# Patient Record
Sex: Female | Born: 1940
Health system: Southern US, Community
[De-identification: ages and names within clinical notes are randomized; demographics above are authoritative.]

## PROBLEM LIST (undated history)

## (undated) DIAGNOSIS — E785 Hyperlipidemia, unspecified: Secondary | ICD-10-CM

## (undated) DIAGNOSIS — E119 Type 2 diabetes mellitus without complications: Secondary | ICD-10-CM

## (undated) DIAGNOSIS — I10 Essential (primary) hypertension: Secondary | ICD-10-CM

## (undated) HISTORY — PX: FOOT FRACTURE SURGERY: SHX645

---

## 2019-06-25 ENCOUNTER — Other Ambulatory Visit: Payer: Self-pay

## 2019-06-25 ENCOUNTER — Encounter (HOSPITAL_COMMUNITY): Payer: Self-pay

## 2019-06-25 ENCOUNTER — Ambulatory Visit (HOSPITAL_COMMUNITY)
Admission: EM | Admit: 2019-06-25 | Discharge: 2019-06-25 | Disposition: A | Discharge status: Home / Self Care | Payer: Medicare Other | Attending: Physician Assistant | Admitting: Physician Assistant

## 2019-06-25 DIAGNOSIS — Z20822 Contact with and (suspected) exposure to covid-19: Secondary | ICD-10-CM | POA: Insufficient documentation

## 2019-06-25 DIAGNOSIS — J301 Allergic rhinitis due to pollen: Secondary | ICD-10-CM | POA: Diagnosis present

## 2019-06-25 DIAGNOSIS — I10 Essential (primary) hypertension: Secondary | ICD-10-CM | POA: Insufficient documentation

## 2019-06-25 DIAGNOSIS — J069 Acute upper respiratory infection, unspecified: Secondary | ICD-10-CM | POA: Diagnosis not present

## 2019-06-25 HISTORY — DX: Type 2 diabetes mellitus without complications: E11.9

## 2019-06-25 HISTORY — DX: Hyperlipidemia, unspecified: E78.5

## 2019-06-25 HISTORY — DX: Essential (primary) hypertension: I10

## 2019-06-25 LAB — CBC
HCT: 37.5 % (ref 36.0–46.0)
Hemoglobin: 12.2 g/dL (ref 12.0–15.0)
MCH: 24.6 pg — ABNORMAL LOW (ref 26.0–34.0)
MCHC: 32.5 g/dL (ref 30.0–36.0)
MCV: 75.8 fL — ABNORMAL LOW (ref 80.0–100.0)
Platelets: 285 10*3/uL (ref 150–400)
RBC: 4.95 MIL/uL (ref 3.87–5.11)
RDW: 15.4 % (ref 11.5–15.5)
WBC: 6 10*3/uL (ref 4.0–10.5)
nRBC: 0 % (ref 0.0–0.2)

## 2019-06-25 LAB — COMPREHENSIVE METABOLIC PANEL
ALT: 14 U/L (ref 0–44)
AST: 17 U/L (ref 15–41)
Albumin: 3.7 g/dL (ref 3.5–5.0)
Alkaline Phosphatase: 57 U/L (ref 38–126)
Anion gap: 8 (ref 5–15)
BUN: 14 mg/dL (ref 8–23)
CO2: 25 mmol/L (ref 22–32)
Calcium: 10.1 mg/dL (ref 8.9–10.3)
Chloride: 110 mmol/L (ref 98–111)
Creatinine, Ser: 1.47 mg/dL — ABNORMAL HIGH (ref 0.44–1.00)
GFR calc Af Amer: 39 mL/min — ABNORMAL LOW (ref >60)
GFR calc non Af Amer: 34 mL/min — ABNORMAL LOW (ref >60)
Glucose, Bld: 104 mg/dL — ABNORMAL HIGH (ref 70–99)
Potassium: 4.3 mmol/L (ref 3.5–5.1)
Sodium: 143 mmol/L (ref 135–145)
Total Bilirubin: 0.4 mg/dL (ref 0.3–1.2)
Total Protein: 7 g/dL (ref 6.5–8.1)

## 2019-06-25 LAB — VITAMIN D 25 HYDROXY (VIT D DEFICIENCY, FRACTURES): Vit D, 25-Hydroxy: 41.53 ng/mL (ref 30–100)

## 2019-06-25 MED ORDER — FLUTICASONE PROPIONATE 50 MCG/ACT NA SUSP: 1.0000 | Freq: Every day | NASAL | 0 refills | Status: DC

## 2019-06-25 MED ORDER — BENZONATATE 100 MG PO CAPS: 100.0000 mg | ORAL_CAPSULE | Freq: Three times a day (TID) | ORAL | 0 refills | Status: DC

## 2019-06-25 MED ORDER — CETIRIZINE HCL 10 MG PO TABS: 5.0000 mg | ORAL_TABLET | Freq: Every day | ORAL | 0 refills | Status: DC

## 2019-06-25 NOTE — ED Triage Notes (Signed)
Pt c/o non-productive cough and sweatsx1 wk. Pt denies SOB. Pt has non labored breathing. Lungs are clear.

## 2019-06-25 NOTE — ED Provider Notes (Signed)
Christy [name]    CSN: [id number] Arrival date & time: 06/25/19  1138      History   Chief Complaint Chief Complaint  Patient presents with  . Cough    HPI Christy [name] is a 79 y.o. female.   Patient reports urgent care for evaluation of cough and nasal congestion.  She reports the cough has been ongoing since moved to [place] about 1 year ago.  She has taken over-the-counter medicines and this has helped. She does not know what these medicines are by name. She reports a little bit worsening cough in the evenings over the last 2 to 3 weeks.  She reports the cough feels as though she is having a tickle in her throat.  The cough is never productive.  She denies chest pain or shortness of breath with the cough.  Though she does states she gets a little tired and short winded occasionally.  She is denying any shortness of breath or chest pain in clinic currently.  Denies fever or chills.  However she has felt a little sweaty and hot occasionally over the last couple weeks.  Denies sore throat or ear pain.  She does report having headaches occasionally that have started since moving to [place] from [place], [place].  She denies headache in clinic today.  Denies blurry vision.  Denies nausea, vomiting or diarrhea.  She reports she takes a medicine for high blood pressure but cannot remember the name.  She reports she takes Metformin for diabetes otherwise she does not take any medicines chronically.  She has not had primary care follow-up since she has moved in [place] from [place].  She had regular primary care in [place].  She reports some falls lately which are due to her tripping.  Denies injuries. Denies losing conciousness.     Past Medical History:  Diagnosis Date  . Diabetes mellitus without complication ([place])   . Hyperlipidemia   . Hypertension     There are no problems to display for this patient.   Past Surgical History:  Procedure  Laterality Date  . FOOT FRACTURE SURGERY Right     OB History   No obstetric history on file.      Home Medications    Prior to Admission medications   Medication Sig Start Date End Date Taking? Authorizing Provider  metFORMIN (GLUCOPHAGE) 500 MG tablet Take by mouth 2 (two) times daily with a meal.   Yes [provider]  benzonatate (TESSALON) 100 MG capsule Take 1 capsule (100 mg total) by mouth every 8 (eight) hours. 06/25/19   Christy [name], Christy Beards, Christy [name]  cetirizine (ZYRTEC ALLERGY) 10 MG tablet Take 0.5 tablets (5 mg total) by mouth daily. 06/25/19 07/25/19  Christy [name], Christy Beards, Christy [name]  fluticasone (FLONASE) 50 MCG/ACT nasal spray Place 1 spray into both nostrils daily. 06/25/19 07/25/19  Christy [name], Christy Beards, Christy [name]    Family History No family history on file.  Social History Social History   Tobacco Use  . Smoking status: Former Research scientist (life sciences)  . Smokeless tobacco: Never Used  Substance Use Topics  . Alcohol use: Not Currently  . Drug use: Never     Allergies   Patient has no known allergies.   Review of Systems Review of Systems Per HPI  Physical Exam Triage Vital Signs ED Triage Vitals [06/25/19 1233]  Enc Vitals Group     BP (!) 191/95     Pulse Rate 88     Resp 16  Temp 98.7 F (37.1 C)     Temp Source Oral     SpO2 100 %     Weight 210 lb (95.3 kg)     Height 5\' 4"  (1.626 m)     Head Circumference      Peak Flow      Pain Score 0     Pain Loc      Pain Edu?      Excl. in [place]?    No data found.  Updated Vital Signs BP (!) 191/95   Pulse 88   Temp 98.7 F (37.1 C) (Oral)   Resp 16   Ht 5\' 4"  (1.626 m)   Wt 210 lb (95.3 kg)   SpO2 100%   BMI 36.05 kg/m    Blood pressure provider recheck- 170/74 right arm  Visual Acuity Right Eye Distance:   Left Eye Distance:   Bilateral Distance:    Right Eye Near:   Left Eye Near:    Bilateral Near:     Physical Exam Vitals and nursing note reviewed.  Constitutional:      General: She is not in acute  distress.    Appearance: She is well-developed. She is not ill-appearing.  HENT:     Head: Normocephalic and atraumatic.     Nose:     Comments: Mild nasal congestion present. Mildly erythematous and swollen turbinates    Mouth/Throat:     Mouth: Mucous membranes are moist.     Pharynx: Oropharynx is clear.  Eyes:     Conjunctiva/sclera: Conjunctivae normal.     Pupils: Pupils are equal, round, and reactive to light.  Cardiovascular:     Rate and Rhythm: Normal rate and regular rhythm.     Heart sounds: No murmur.  Pulmonary:     Effort: Pulmonary effort is normal. No respiratory distress.     Breath sounds: Normal breath sounds.  Abdominal:     Palpations: Abdomen is soft.     Tenderness: There is no abdominal tenderness.  Musculoskeletal:     Cervical back: Neck supple.     Right lower leg: No edema.     Left lower leg: No edema.  Lymphadenopathy:     Cervical: No cervical adenopathy.  Skin:    General: Skin is warm and dry.  Neurological:     General: No focal deficit present.     Mental Status: She is alert and oriented to person, place, and time.      UC Treatments / Results  Labs (all labs ordered are listed, but only abnormal results are displayed) Labs Reviewed  CBC - Abnormal; Notable for the following components:      Result Value   MCV 75.8 (*)    MCH 24.6 (*)    All other components within normal limits  COMPREHENSIVE METABOLIC PANEL - Abnormal; Notable for the following components:   Glucose, Bld 104 (*)    Creatinine, Ser 1.47 (*)    GFR calc non Af Amer 34 (*)    GFR calc Af Amer 39 (*)    All other components within normal limits  SARS CORONAVIRUS 2 (TAT 6-24 HRS)  VITAMIN D 25 HYDROXY (VIT D DEFICIENCY, FRACTURES)    EKG   Radiology No results found.  Procedures Procedures (including critical care time)  Medications Ordered in UC Medications - No data to display  Initial Impression / Assessment and Plan / UC Course  I have reviewed  the triage vital signs and the nursing  notes.  Pertinent labs & imaging results that were available during my care of the patient were reviewed by me and considered in my medical decision making (see chart for details).     #Viral URI with cough #Allergic rhinitis #Elevated blood pressure Patient is a 79 year old-year-old presenting with cough and nasal congestion.  Patient is new to the area and does not establish care.  I do believe that this is likely allergy however we will test for Covid.  She is hemodynamically well and saturating 100% without respiratory distress.  Blood pressure was initially elevated to 191/95 however on recheck is somewhat improved to 170/74.  Patient not experiencing chest pain or shortness of breath in clinic.   They verbalized understanding.  Patient with strict return precautions for worsening cough, shortness of breath or chest pain. -CMP significant for creatinine at 1.47. no comparison. Doubt acute renal failure, likely chronic. Patient called and results discussed and re-emphasized follow up with internal medicine clinic. Patient verbalizes understanding.    Final Clinical Impressions(s) / UC Diagnoses   Final diagnoses:  Viral URI with cough  Allergic rhinitis due to pollen, unspecified seasonality  Encounter for laboratory testing for COVID-19 virus     Discharge Instructions     Please stop the store bought medicines, but continue your blood pressure medicine and metformin  Start taking 1/2 tablet of zyrtec and using flonase 1 time daily for allergies and nasal congestion Take the tessalon gel cap up to 3 times daily for cough  Please call the internal medicine clinic to establish care   I have sent some lab work, I will call if anything requires attention, otherwise your results will be in your mychart  If cough worsens, you develop shortness of breath, chest pain or fevers please return  If your Covid-19 test is positive, you will receive a  phone call from [facility] regarding your results. Negative test results are not called. Both positive and negative results area always visible on MyChart. If you do not have a MyChart account, sign up instructions are in your discharge papers.   Persons who are directed to care for themselves at home may discontinue isolation under the following conditions:  . At least 10 days have passed since symptom onset and . At least 24 hours have passed without running a fever (this means without the use of fever-reducing medications) and . Other symptoms have improved.  Persons infected with COVID-19 who never develop symptoms may discontinue isolation and other precautions 10 days after the date of their first positive COVID-19 test.       ED Prescriptions    Medication Sig Dispense Auth. Provider   cetirizine (ZYRTEC ALLERGY) 10 MG tablet Take 0.5 tablets (5 mg total) by mouth daily. 15 tablet Alec Mcphee, Christy Beards, Christy [name]   fluticasone (FLONASE) 50 MCG/ACT nasal spray Place 1 spray into both nostrils daily. 1 g Zakyia Gagan, Christy Beards, Christy [name]   benzonatate (TESSALON) 100 MG capsule Take 1 capsule (100 mg total) by mouth every 8 (eight) hours. 21 capsule Shinichi Anguiano, Christy Beards, Christy [name]     PDMP not reviewed this encounter.   Purnell Shoemaker, Christy [name] 06/25/19 2235

## 2019-06-25 NOTE — Discharge Instructions (Addendum)
Please stop the store bought medicines, but continue your blood pressure medicine and metformin  Start taking 1/2 tablet of zyrtec and using flonase 1 time daily for allergies and nasal congestion Take the tessalon gel cap up to 3 times daily for cough  Please call the internal medicine clinic to establish care   I have sent some lab work, I will call if anything requires attention, otherwise your results will be in your mychart  If cough worsens, you develop shortness of breath, chest pain or fevers please return  If your Covid-19 test is positive, you will receive a phone call from [facility] regarding your results. Negative test results are not called. Both positive and negative results area always visible on MyChart. If you do not have a MyChart account, sign up instructions are in your discharge papers.   Persons who are directed to care for themselves at home may discontinue isolation under the following conditions:   At least 10 days have passed since symptom onset and  At least 24 hours have passed without running a fever (this means without the use of fever-reducing medications) and  Other symptoms have improved.  Persons infected with COVID-19 who never develop symptoms may discontinue isolation and other precautions 10 days after the date of their first positive COVID-19 test.

## 2019-06-26 LAB — SARS CORONAVIRUS 2 (TAT 6-24 HRS): SARS Coronavirus 2: NEGATIVE

## 2019-09-26 ENCOUNTER — Ambulatory Visit (HOSPITAL_COMMUNITY)
Admission: EM | Admit: 2019-09-26 | Discharge: 2019-09-26 | Discharge status: Against Medical Advice | Payer: Medicare Other | Attending: Emergency Medicine | Admitting: Emergency Medicine

## 2019-09-26 ENCOUNTER — Other Ambulatory Visit: Payer: Self-pay

## 2019-09-26 ENCOUNTER — Encounter (HOSPITAL_COMMUNITY): Payer: Self-pay | Admitting: Emergency Medicine

## 2019-09-26 NOTE — ED Triage Notes (Addendum)
Pt c/o recently moving here and needing refills on her medication. She states she still has 2 days worth of diabetic medication left. She has some weakness in her legs. She takes Metformin and Benicar. She has the bottles with her today. She has an appt to see a PCP in September.

## 2019-09-27 ENCOUNTER — Ambulatory Visit (HOSPITAL_COMMUNITY)
Admission: RE | Admit: 2019-09-27 | Discharge: 2019-09-27 | Disposition: A | Discharge status: Home / Self Care | Payer: Medicare Other | Source: Ambulatory Visit | Attending: Family Medicine | Admitting: Family Medicine

## 2019-09-27 ENCOUNTER — Encounter (HOSPITAL_COMMUNITY): Payer: Self-pay

## 2019-09-27 VITALS — BP 202/95 | HR 91 | Temp 98.4°F | Resp 18

## 2019-09-27 DIAGNOSIS — Z760 Encounter for issue of repeat prescription: Secondary | ICD-10-CM | POA: Diagnosis not present

## 2019-09-27 DIAGNOSIS — R059 Cough, unspecified: Secondary | ICD-10-CM

## 2019-09-27 DIAGNOSIS — E119 Type 2 diabetes mellitus without complications: Secondary | ICD-10-CM

## 2019-09-27 DIAGNOSIS — I10 Essential (primary) hypertension: Secondary | ICD-10-CM

## 2019-09-27 DIAGNOSIS — R05 Cough: Secondary | ICD-10-CM

## 2019-09-27 MED ORDER — CETIRIZINE HCL 10 MG PO TABS: 10.0000 mg | ORAL_TABLET | Freq: Every day | ORAL | 0 refills | Status: DC

## 2019-09-27 MED ORDER — METFORMIN HCL 500 MG PO TABS: 500.0000 mg | ORAL_TABLET | Freq: Two times a day (BID) | ORAL | 1 refills | Status: DC

## 2019-09-27 MED ORDER — OLMESARTAN MEDOXOMIL 20 MG PO TABS: 20.0000 mg | ORAL_TABLET | Freq: Every day | ORAL | 1 refills | Status: DC

## 2019-09-27 NOTE — Discharge Instructions (Signed)
Zyrtec daily as needed Refilled your daily medicine.  Follow up with the primary care as planned.

## 2019-09-27 NOTE — ED Triage Notes (Addendum)
Patient here for fatigue and HTN. Reports this has been going on for over a week. Also endorses cough  Denies: headache, shortness of breath.  Also requesting refills on Metformin and Benicar Tabs

## 2019-09-27 NOTE — ED Provider Notes (Addendum)
[place]    CSN: [id number] Arrival date & time: 09/27/19  1130      History   Chief Complaint Chief Complaint  Patient presents with  . Hypertension  . Fatigue    HPI Christy [name] is a 79 y.o. female.   Patient is a 79 year old female with past medical history of diabetes, hypertension, hyperlipidemia.  She presents today for needing a medication refill.  Recently moved here from [facility]  Has not taken her blood pressure medicine in approximately 1 week and only has a few Metformin left.  She has been overall just not feeling well, fatigued, weak and having allergy type symptoms.  She was recently started on cetirizine.  She is currently out of this medicine.  No fever, chills, body aches, headache, chest pain or shortness of breath.  No dizziness, blurred vision, slurred speech or unilateral weakness.  No urinary symptoms  ROS per HPI      Past Medical History:  Diagnosis Date  . Diabetes mellitus without complication ([place])   . Hyperlipidemia   . Hypertension     There are no problems to display for this patient.   Past Surgical History:  Procedure Laterality Date  . FOOT FRACTURE SURGERY Right     OB History   No obstetric history on file.      Home Medications    Prior to Admission medications   Medication Sig Start Date End Date Taking? Authorizing Provider  cetirizine (ZYRTEC ALLERGY) 10 MG tablet Take 1 tablet (10 mg total) by mouth daily. 09/27/19 10/27/19  Loura Halt A, NP  fluticasone (FLONASE) 50 MCG/ACT nasal spray Place 1 spray into both nostrils daily. 06/25/19 07/25/19  Darr, Marguerita Beards, PA-C  metFORMIN (GLUCOPHAGE) 500 MG tablet Take 1 tablet (500 mg total) by mouth 2 (two) times daily with a meal. 09/27/19   Avea Mcgowen A, NP  olmesartan (BENICAR) 20 MG tablet Take 1 tablet (20 mg total) by mouth daily. 09/27/19   Orvan July, NP    Family History Family History  Family history unknown: Yes    Social  History Social History   Tobacco Use  . Smoking status: Former Research scientist (life sciences)  . Smokeless tobacco: Never Used  Vaping Use  . Vaping Use: Never used  Substance Use Topics  . Alcohol use: Not Currently  . Drug use: Never     Allergies   Patient has no known allergies.   Review of Systems Review of Systems   Physical Exam Triage Vital Signs ED Triage Vitals  Enc Vitals Group     BP 09/27/19 1201 (!) 202/95     Pulse Rate 09/27/19 1201 91     Resp 09/27/19 1201 18     Temp 09/27/19 1201 98.4 F (36.9 C)     Temp src --      SpO2 09/27/19 1201 99 %     Weight --      Height --      Head Circumference --      Peak Flow --      Pain Score 09/27/19 1159 0     Pain Loc --      Pain Edu? --      Excl. in [place]? --    No data found.  Updated Vital Signs BP (!) 202/95   Pulse 91   Temp 98.4 F (36.9 C)   Resp 18   SpO2 99%   Visual Acuity Right Eye Distance:  Left Eye Distance:   Bilateral Distance:    Right Eye Near:   Left Eye Near:    Bilateral Near:     Physical Exam Vitals and nursing note reviewed.  Constitutional:      General: She is not in acute distress.    Appearance: Normal appearance. She is not ill-appearing, toxic-appearing or diaphoretic.  HENT:     Head: Normocephalic.     Right Ear: Tympanic membrane and ear canal normal.     Left Ear: Tympanic membrane and ear canal normal.     Nose: Nose normal.     Mouth/Throat:     Pharynx: Oropharynx is clear.  Eyes:     Conjunctiva/sclera: Conjunctivae normal.     Pupils: Pupils are equal, round, and reactive to light.  Cardiovascular:     Rate and Rhythm: Normal rate and regular rhythm.  Pulmonary:     Effort: Pulmonary effort is normal.     Breath sounds: Normal breath sounds.  Musculoskeletal:        General: Normal range of motion.     Cervical back: Normal range of motion.  Skin:    General: Skin is warm and dry.     Findings: No rash.  Neurological:     General: No focal deficit present.      Mental Status: She is alert.  Psychiatric:        Mood and Affect: Mood normal.      UC Treatments / Results  Labs (all labs ordered are listed, but only abnormal results are displayed) Labs Reviewed - No data to display  EKG   Radiology No results found.  Procedures Procedures (including critical care time)  Medications Ordered in UC Medications - No data to display  Initial Impression / Assessment and Plan / UC Course  I have reviewed the triage vital signs and the nursing notes.  Pertinent labs & imaging results that were available during my care of the patient were reviewed by me and considered in my medical decision making (see chart for details).     Essential hypertension Patient hypertensive today at 202/95.  She has not had her blood pressure medicine in over a week. Otherwise she has no neurological deficits on exam and denies any concerning symptoms We will go ahead and refill her Benicar  Diabetes type 2 Refilled Metformin Patient reports she has been checking her blood sugars at home and ranging in the 160s. Reports she does not drink much water, mostly teas and sodas. Recommended to great decrease sugary drinks and increase water She may be mildly dehydrated causing some of her symptoms.   Cough Most likely from allergies Refilled her allergy meds Lungs clear on exam.     Pt has PCP appointment in September.    Final Clinical Impressions(s) / UC Diagnoses   Final diagnoses:  Medicine refill  Essential hypertension  Type 2 diabetes mellitus without complication, without long-term current use of insulin (H[facility])  Cough     Discharge Instructions     Zyrtec daily as needed Refilled your daily medicine.  Follow up with the primary care as planned.     ED Prescriptions    Medication Sig Dispense Auth. Provider   olmesartan (BENICAR) 20 MG tablet Take 1 tablet (20 mg total) by mouth daily. 30 tablet Rainen Vanrossum A, NP   metFORMIN  (GLUCOPHAGE) 500 MG tablet Take 1 tablet (500 mg total) by mouth 2 (two) times daily with a meal. 60 tablet [place], Catharina Pica  A, NP   cetirizine (ZYRTEC ALLERGY) 10 MG tablet Take 1 tablet (10 mg total) by mouth daily. 30 tablet Loura Halt A, NP     PDMP not reviewed this encounter.   Orvan July, NP 09/27/19 1503    Loura Halt A, NP 09/27/19 1504

## 2020-06-12 ENCOUNTER — Ambulatory Visit
Admission: RE | Admit: 2020-06-12 | Discharge: 2020-06-12 | Disposition: A | Discharge status: Home / Self Care | Payer: Medicare Other | Source: Ambulatory Visit | Attending: Emergency Medicine | Admitting: Emergency Medicine

## 2020-06-12 ENCOUNTER — Other Ambulatory Visit: Payer: Self-pay

## 2020-06-12 VITALS — BP 160/85 | HR 99 | Temp 97.9°F | Resp 16

## 2020-06-12 DIAGNOSIS — I10 Essential (primary) hypertension: Secondary | ICD-10-CM

## 2020-06-12 DIAGNOSIS — N6320 Unspecified lump in the left breast, unspecified quadrant: Secondary | ICD-10-CM | POA: Diagnosis not present

## 2020-06-12 DIAGNOSIS — R21 Rash and other nonspecific skin eruption: Secondary | ICD-10-CM

## 2020-06-12 DIAGNOSIS — N63 Unspecified lump in unspecified breast: Secondary | ICD-10-CM

## 2020-06-12 MED ORDER — CLOTRIMAZOLE 1 % EX CREA: 1.0000 "application " | TOPICAL_CREAM | Freq: Two times a day (BID) | CUTANEOUS | 0 refills | Status: DC

## 2020-06-12 NOTE — ED Triage Notes (Signed)
Pt present a rash on her neck and chest area. Pt states the rash started two week ago. Pt also present a lump underneath left arm. Pt states it only hurts when she laying down on that side.

## 2020-06-12 NOTE — Discharge Instructions (Addendum)
You should receive a call from the [facility] to schedule a mammogram or ultrasound.    Use the prescribed cream on your rash as directed.    Your blood pressure is elevated today at 160/85.  Please have this rechecked by your primary care provider in 2-4 weeks.

## 2020-06-12 NOTE — ED Provider Notes (Signed)
[name]    CSN: [id number] Arrival date & time: 06/12/20  1129      History   Chief Complaint Chief Complaint  Patient presents with  . Rash    HPI Christy [name] is a 80 y.o. female.   Accompanied by her daughter, patient presents with a "lump" on the left lateral breast/lower axilla for several weeks.  It is painful if she is lying on her left side.  No known trauma.  She denies rash in that area, redness, bruising, nipple discharge, fever, chills.  Patient also reports pruritic rash on her neck x2 weeks.  She denies drainage, erythema, or other symptoms.  Treatment attempted at home with antiitch cream.  Her medical history includes hypertension, diabetes, hyperlipidemia.  The history is provided by the patient.    Past Medical History:  Diagnosis Date  . Diabetes mellitus without complication ([place])   . Hyperlipidemia   . Hypertension     There are no problems to display for this patient.   Past Surgical History:  Procedure Laterality Date  . FOOT FRACTURE SURGERY Right     OB History   No obstetric history on file.      Home Medications    Prior to Admission medications   Medication Sig Start Date End Date Taking? Authorizing Provider  clotrimazole (CLOTRIMAZOLE AF) 1 % cream Apply 1 application topically 2 (two) times daily. 06/12/20  Yes Sharion Balloon, NP  cetirizine (ZYRTEC ALLERGY) 10 MG tablet Take 1 tablet (10 mg total) by mouth daily. 09/27/19 10/27/19  Loura Halt A, NP  fluticasone (FLONASE) 50 MCG/ACT nasal spray Place 1 spray into both nostrils daily. 06/25/19 07/25/19  Darr, Edison Nasuti, PA-C  metFORMIN (GLUCOPHAGE) 500 MG tablet Take 1 tablet (500 mg total) by mouth 2 (two) times daily with a meal. 09/27/19   Bast, Traci A, NP  olmesartan (BENICAR) 20 MG tablet Take 1 tablet (20 mg total) by mouth daily. 09/27/19   Orvan July, NP    Family History Family History  Family history unknown: Yes    Social History Social History    Tobacco Use  . Smoking status: Former Research scientist (life sciences)  . Smokeless tobacco: Never Used  Vaping Use  . Vaping Use: Never used  Substance Use Topics  . Alcohol use: Not Currently  . Drug use: Never     Allergies   Patient has no known allergies.   Review of Systems Review of Systems  Constitutional: Negative for chills and fever.  HENT: Negative for ear pain and sore throat.   Eyes: Negative for pain and visual disturbance.  Respiratory: Negative for cough and shortness of breath.   Cardiovascular: Negative for chest pain and palpitations.  Gastrointestinal: Negative for abdominal pain and vomiting.  Genitourinary: Negative for dysuria and hematuria.  Musculoskeletal: Negative for arthralgias and back pain.  Skin: Positive for rash. Negative for color change.  Neurological: Negative for seizures and syncope.  All other systems reviewed and are negative.    Physical Exam Triage Vital Signs ED Triage Vitals  Enc Vitals Group     BP      Pulse      Resp      Temp      Temp src      SpO2      Weight      Height      Head Circumference      Peak Flow      Pain Score  Pain Loc      Pain Edu?      Excl. in [place]?    No data found.  Updated Vital Signs BP (!) 160/85 (BP Location: Left Arm)   Pulse 99   Temp 97.9 F (36.6 C) (Oral)   Resp 16   SpO2 100%   Visual Acuity Right Eye Distance:   Left Eye Distance:   Bilateral Distance:    Right Eye Near:   Left Eye Near:    Bilateral Near:     Physical Exam Vitals and nursing note reviewed.  Constitutional:      General: She is not in acute distress.    Appearance: She is well-developed. She is obese.  HENT:     Head: Normocephalic and atraumatic.     Mouth/Throat:     Mouth: Mucous membranes are moist.  Eyes:     Conjunctiva/sclera: Conjunctivae normal.  Cardiovascular:     Rate and Rhythm: Normal rate and regular rhythm.     Heart sounds: Normal heart sounds.  Pulmonary:     Effort: Pulmonary effort  is normal. No respiratory distress.     Breath sounds: Normal breath sounds.  Chest:    Abdominal:     Palpations: Abdomen is soft.     Tenderness: There is no abdominal tenderness.  Musculoskeletal:     Cervical back: Neck supple.  Skin:    General: Skin is warm and dry.     Findings: Rash present.     Comments: Hyperpigmented patchy rash on left lateral neck.  No drainage or erythema.  Neurological:     General: No focal deficit present.     Mental Status: She is alert and oriented to person, place, and time.  Psychiatric:        Mood and Affect: Mood normal.        Behavior: Behavior normal.      UC Treatments / Results  Labs (all labs ordered are listed, but only abnormal results are displayed) Labs Reviewed - No data to display  EKG   Radiology No results found.  Procedures Procedures (including critical care time)  Medications Ordered in UC Medications - No data to display  Initial Impression / Assessment and Plan / UC Course  I have reviewed the triage vital signs and the nursing notes.  Pertinent labs & imaging results that were available during my care of the patient were reviewed by me and considered in my medical decision making (see chart for details).   Mass in left breast.  Rash left lateral neck.  Elevated blood pressure reading with known hypertension.  Referral for mammogram or ultrasound made to [facility].  Discussed with patient that they will call her to schedule an appointment; phone number for breast center provided to patient to call them if she has not received a call by early next week.  Treating rash on neck with clotrimazole cream.  Instructed patient to follow-up with her PCP if the rash is not improving.  Also discussed that her blood pressure is elevated today and needs to be rechecked by her PCP in 2 to 4 weeks.  Education provided on managing hypertension.  Patient and her daughter agree to plan of care.      Final  Clinical Impressions(s) / UC Diagnoses   Final diagnoses:  Lump or mass in breast  Rash of neck  Elevated blood pressure reading in office with diagnosis of hypertension     Discharge Instructions  You should receive a call from the [facility] to schedule a mammogram or ultrasound.    Use the prescribed cream on your rash as directed.    Your blood pressure is elevated today at 160/85.  Please have this rechecked by your primary care provider in 2-4 weeks.         ED Prescriptions    Medication Sig Dispense Auth. Provider   clotrimazole (CLOTRIMAZOLE AF) 1 % cream Apply 1 application topically 2 (two) times daily. 30 g Sharion Balloon, NP     PDMP not reviewed this encounter.   Sharion Balloon, NP 06/12/20 1242

## 2020-06-13 ENCOUNTER — Other Ambulatory Visit: Payer: Self-pay | Admitting: Emergency Medicine

## 2020-06-13 DIAGNOSIS — N63 Unspecified lump in unspecified breast: Secondary | ICD-10-CM

## 2020-07-24 ENCOUNTER — Ambulatory Visit
Admission: RE | Admit: 2020-07-24 | Discharge: 2020-07-24 | Disposition: A | Discharge status: Home / Self Care | Payer: Medicare Other | Source: Ambulatory Visit | Attending: Emergency Medicine | Admitting: Emergency Medicine

## 2020-07-24 ENCOUNTER — Other Ambulatory Visit: Payer: Self-pay

## 2020-07-24 ENCOUNTER — Other Ambulatory Visit: Payer: Self-pay | Admitting: Emergency Medicine

## 2020-07-24 DIAGNOSIS — N63 Unspecified lump in unspecified breast: Secondary | ICD-10-CM

## 2020-07-24 IMAGING — MG DIGITAL DIAGNOSTIC BILAT W/ TOMO W/ CAD
6 of 10 series · 6 of 30 positions shown · non-contrast
Comparison: None.

CLINICAL DATA: 80-year-old female with palpable LEFT breast mass
discovered on self-examination, and for new baseline bilateral
mammogram.

EXAM:
DIGITAL DIAGNOSTIC BILATERAL MAMMOGRAM WITH TOMOSYNTHESIS AND CAD;
ULTRASOUND LEFT BREAST LIMITED
TECHNIQUE: Bilateral digital diagnostic mammography and breast tomosynthesis
was performed. The images were evaluated with computer-aided
detection.; Targeted ultrasound examination of the left breast was
performed

[L MLO synth-2D]
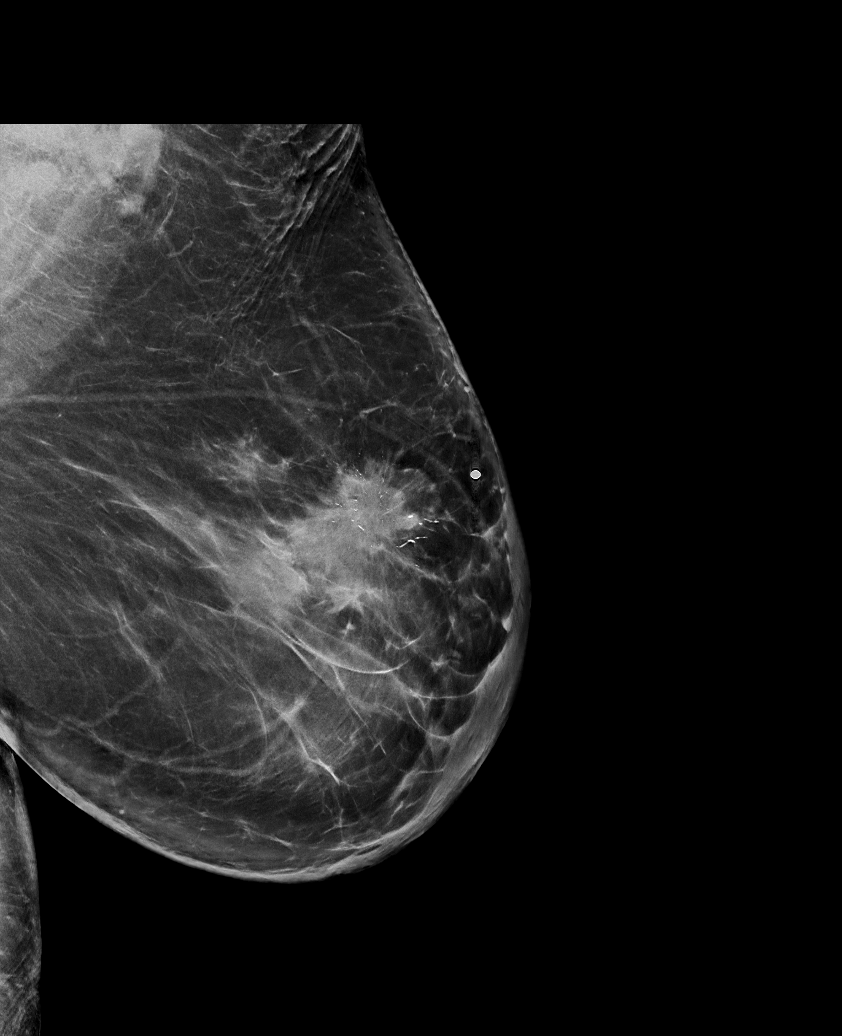

[L CC synth-2D (1 of 2)]
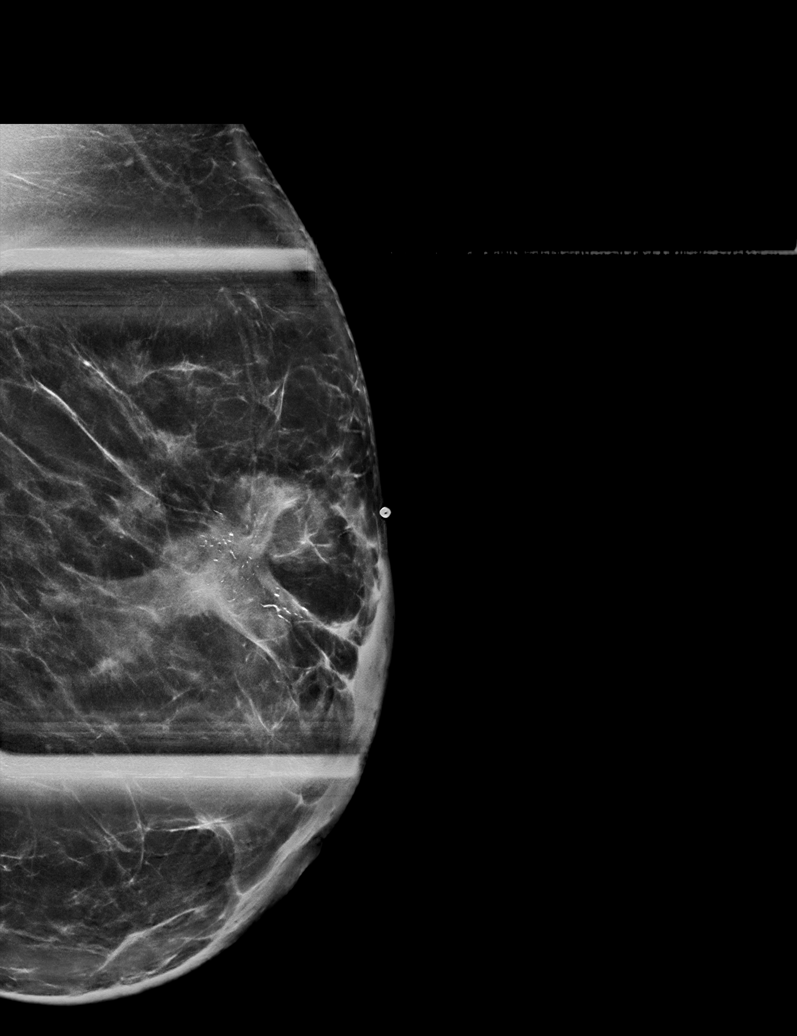

[L CC synth-2D (2 of 2)]
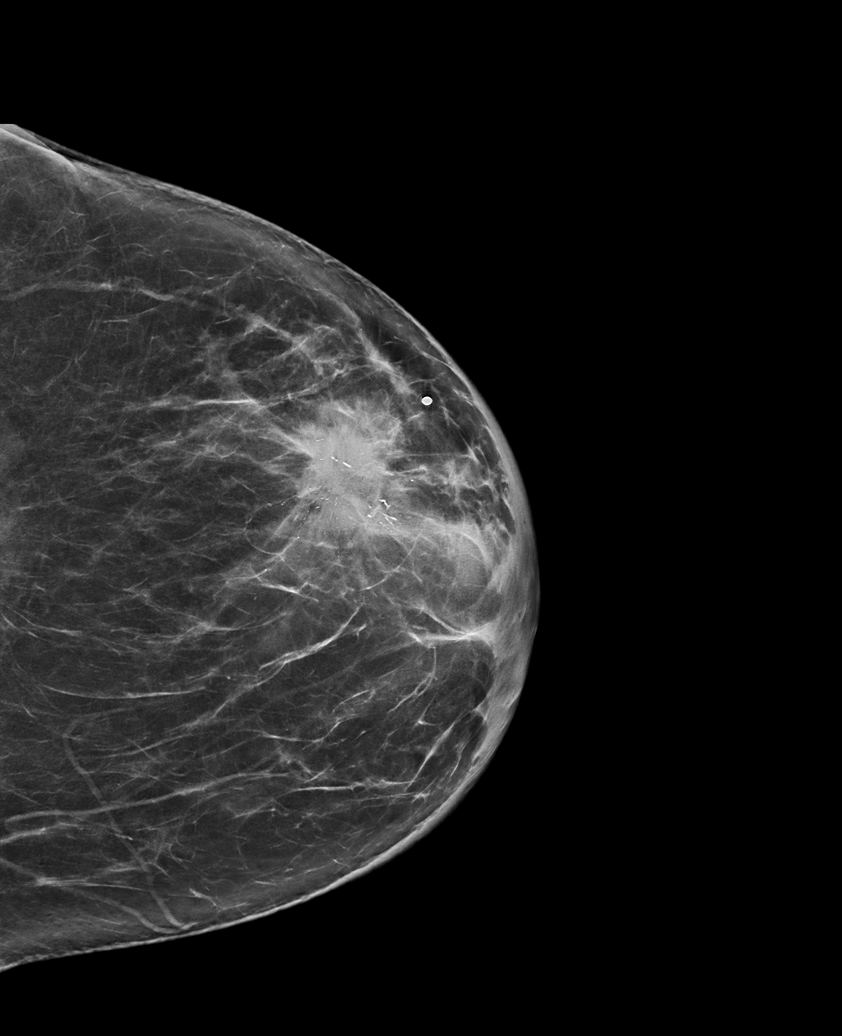

[R CC synth-2D]
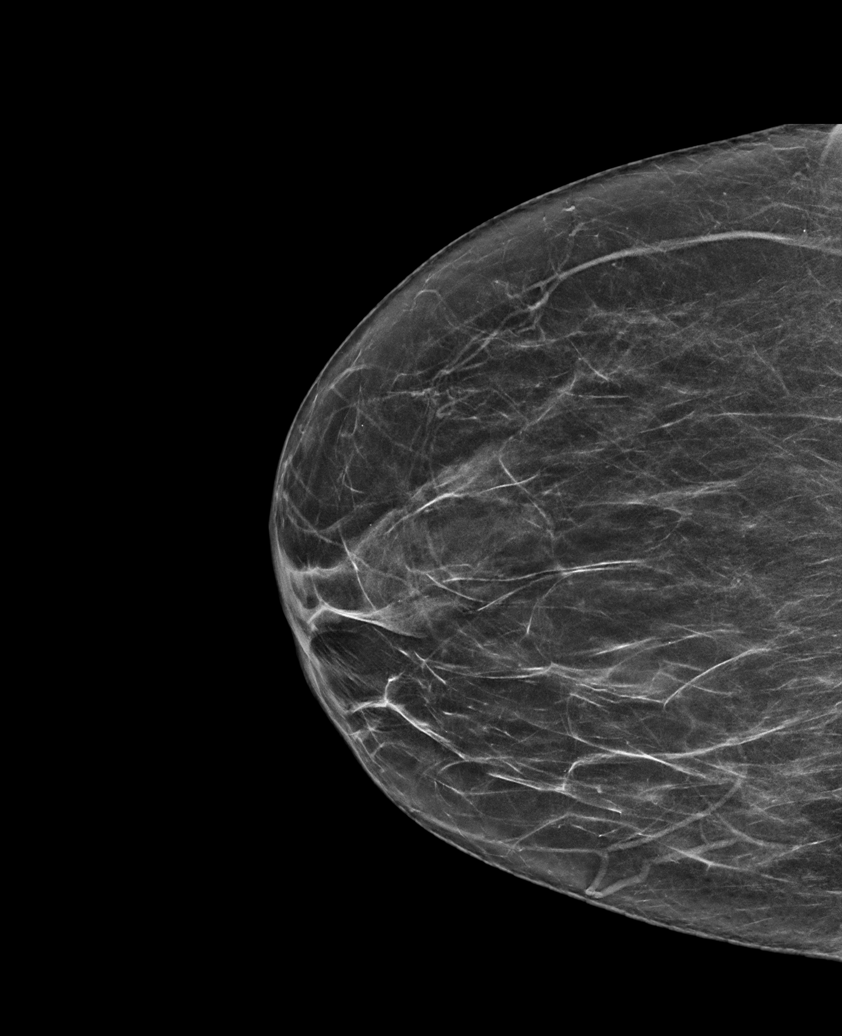

[R MLO synth-2D]
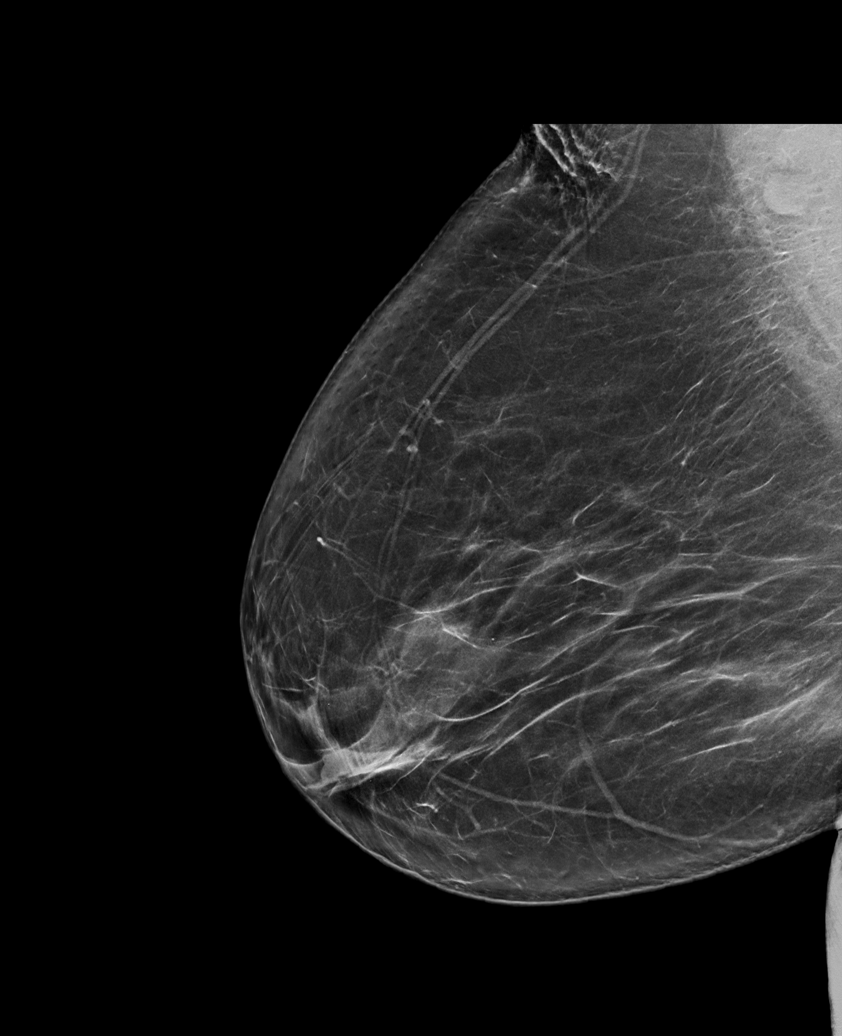

[L MLO tomo · tomo slice 49/98.0]
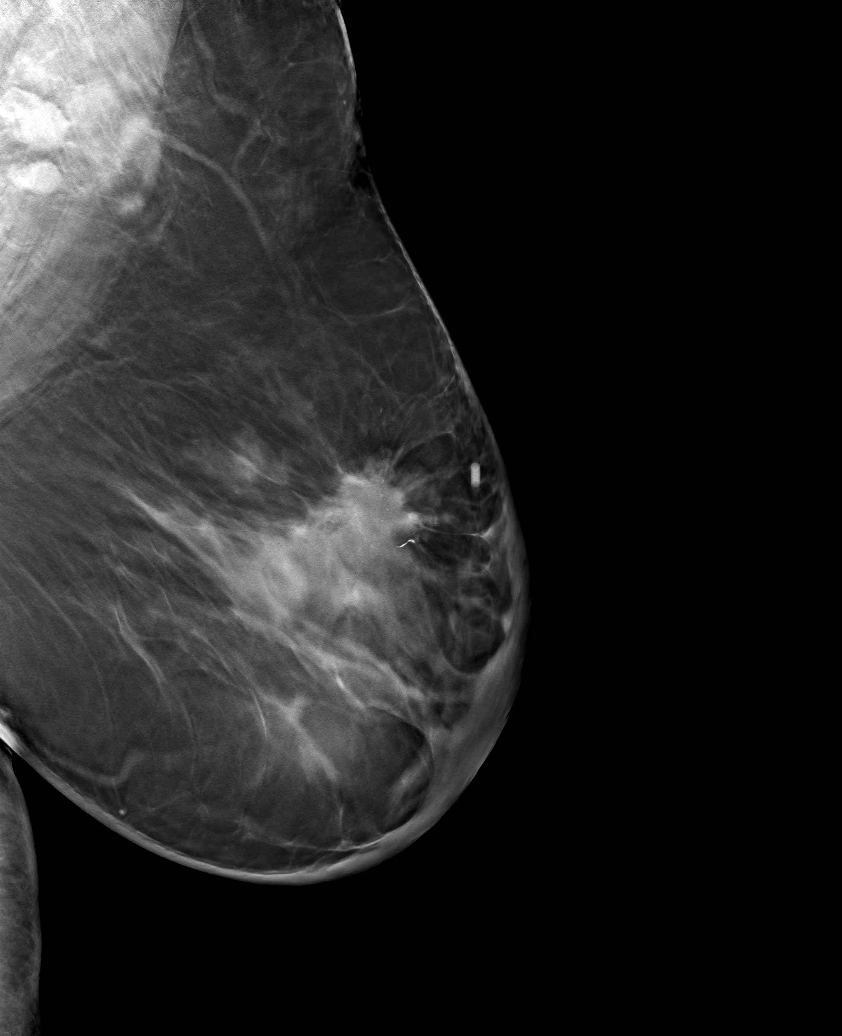

[6 of 30 positions shown; findings below may reference images not displayed]

ACR Breast Density Category b: There are scattered areas of
fibroglandular density.
FINDINGS: 2D/3D full field views of both breasts and spot compression view of
the LEFT breast demonstrate a dominant irregular UPPER-OUTER LEFT
breast mass containing pleomorphic calcifications.

A smaller irregular mass is identified posterolateral to the
dominant mass containing calcifications.

Anterior LEFT breast skin thickening is noted.

No suspicious mammographic abnormalities are noted within the RIGHT
breast.

On physical exam, a very firm mass is identified at the [DATE]
position of the LEFT breast 5 cm from the nipple.

Targeted ultrasound of the LEFT breast/axilla is performed, showing
the following:

A 3.9 x 3.6 x 4.3 cm irregular hypoechoic mass at the [DATE] position
5 cm from the nipple.

A 2.3 x 1.8 x 2.7 cm irregular mass at the 2 o'clock position 8 cm
from the nipple.

The 2 masses above encompass an area measuring at least 6.1 cm.

Anterior LEFT breast skin thickening.

Three abnormal LEFT axillary lymph nodes with cortical thickening.
IMPRESSION: 1. Highly suspicious 4.3 cm mass at the [DATE] position of the LEFT
breast and highly suspicious 2.7 cm mass at the 2 o'clock position
of the LEFT breast, with the 2 masses encompassing an area measuring
at least 6.1 cm. Tissue sampling of both of these masses
recommended.
2. 3 abnormal LEFT axillary lymph nodes with cortical thickening,
tissue sampling of 1 of these lymph nodes recommended.
3. Anterior LEFT breast skin thickening nonspecific but may reflect
dermal involvement.
4. No mammographic evidence of RIGHT breast malignancy.

RECOMMENDATION:
Ultrasound-guided biopsies of LEFT breast mass at the [DATE] position,
LEFT breast mass at the 2 o'clock position and 1 of the abnormal
LEFT axillary lymph nodes.

I have discussed the findings and recommendations with the patient.
If applicable, a reminder letter will be sent to the patient
regarding the next appointment.

BI-RADS CATEGORY  5: Highly suggestive of malignancy.

## 2020-07-24 IMAGING — US US BREAST*L* LIMITED INC AXILLA
1 series · 13 of 25 positions shown · non-contrast
Comparison: None.

CLINICAL DATA: 80-year-old female with palpable LEFT breast mass
discovered on self-examination, and for new baseline bilateral
mammogram.

EXAM:
DIGITAL DIAGNOSTIC BILATERAL MAMMOGRAM WITH TOMOSYNTHESIS AND CAD;
ULTRASOUND LEFT BREAST LIMITED
TECHNIQUE: Bilateral digital diagnostic mammography and breast tomosynthesis
was performed. The images were evaluated with computer-aided
detection.; Targeted ultrasound examination of the left breast was
performed

[Series 1: us breast*left* limited inc axilla · 0.08mm/px · 13 of 25 slices shown]
[im 1/25]
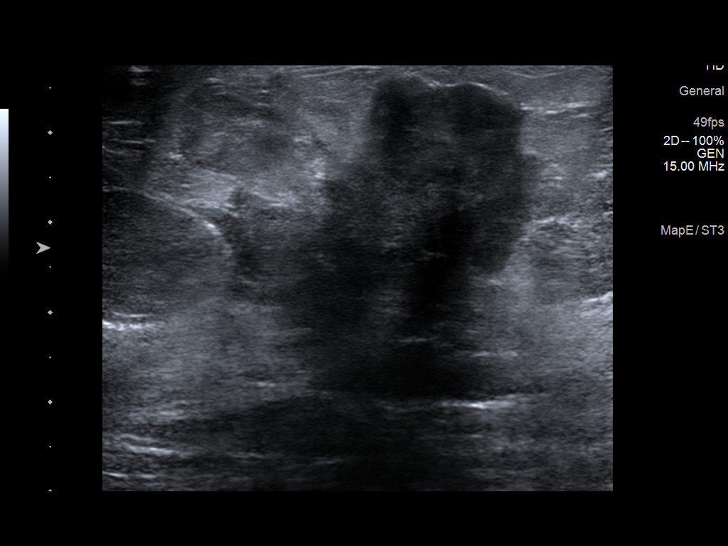
[im 3/25]
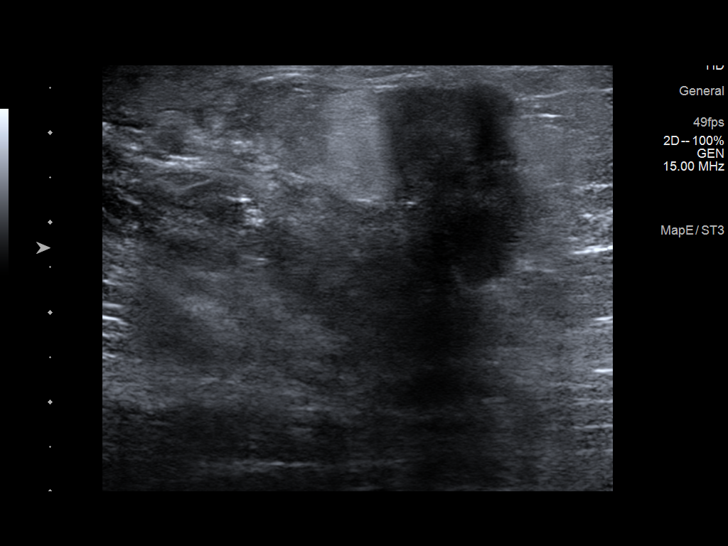
[im 5/25]
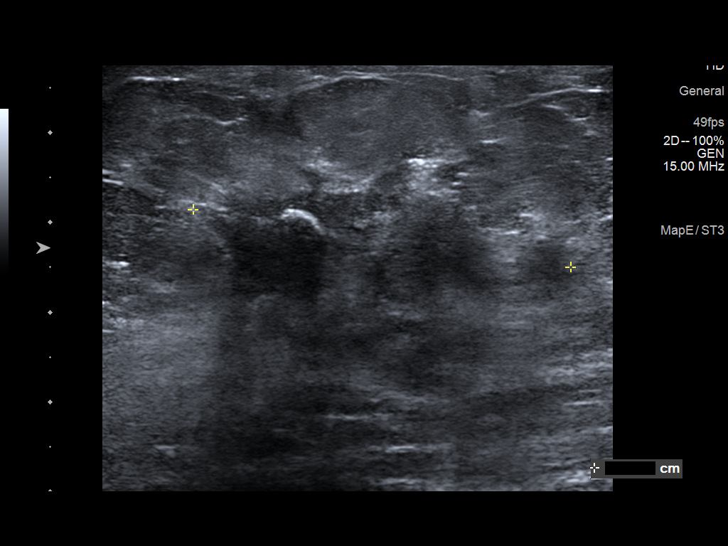
[im 7/25]
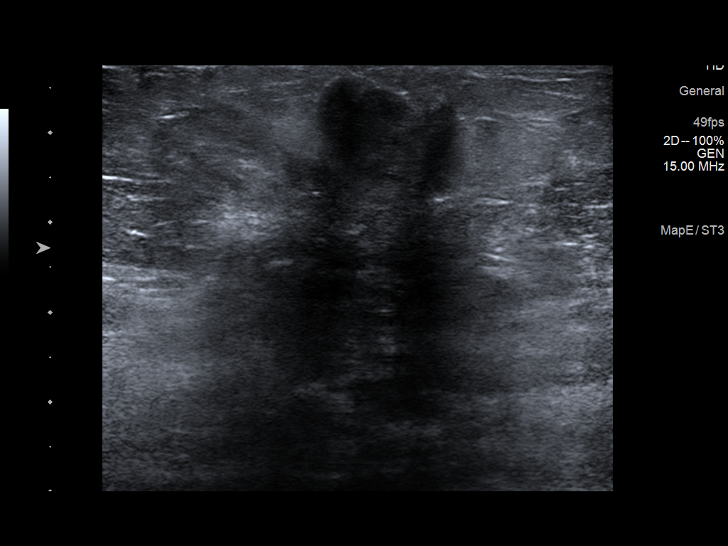
[im 9/25]
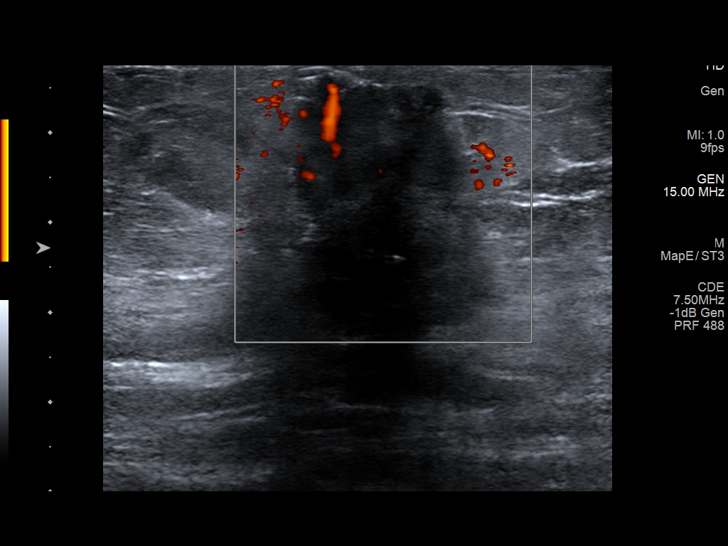
[im 11/25]
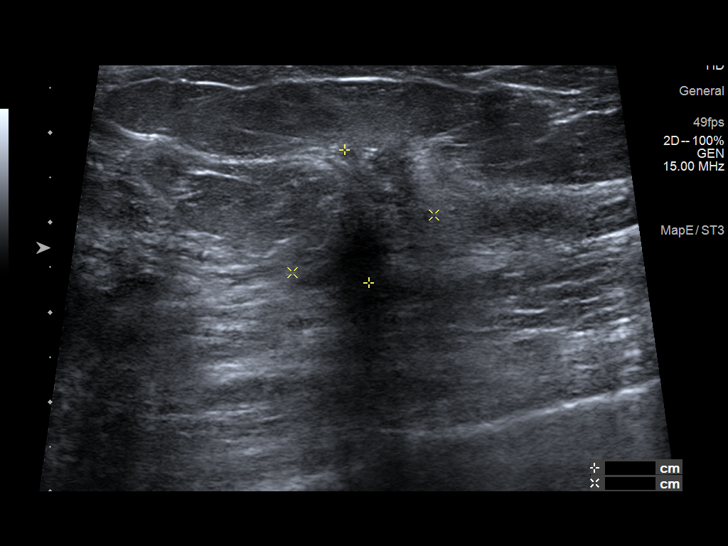
[im 13/25]
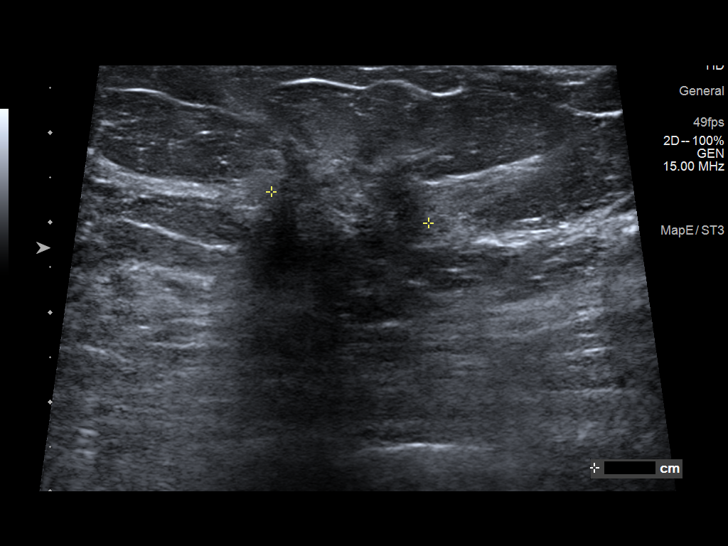
[im 15/25]
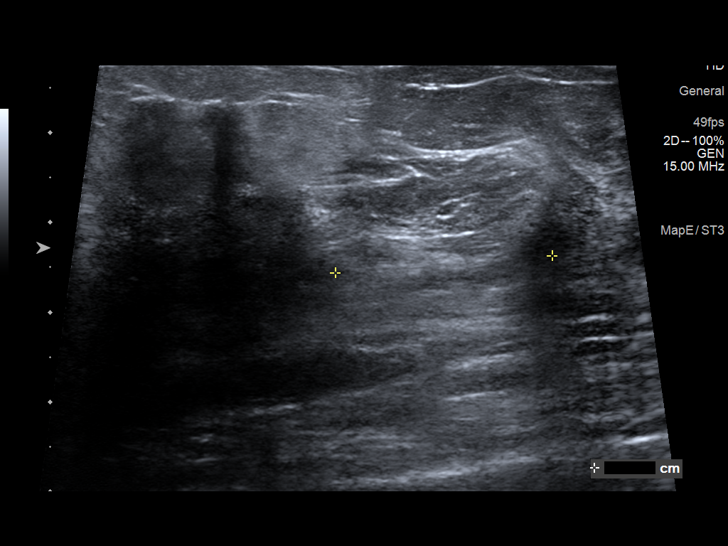
[im 17/25]
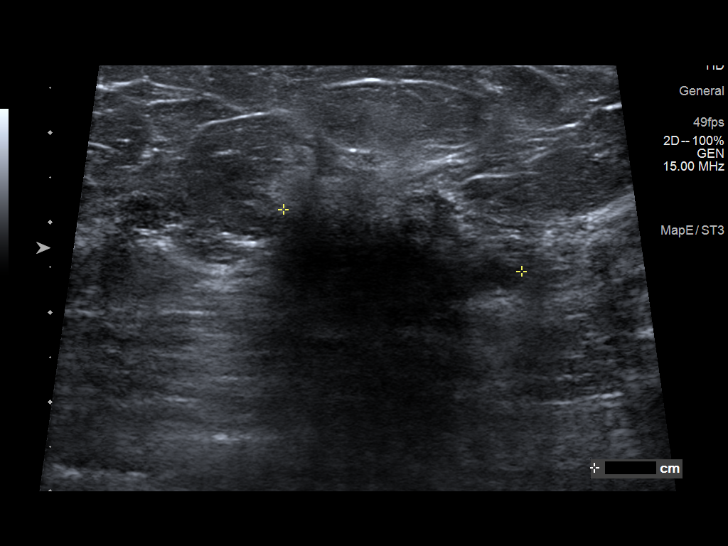
[im 19/25]
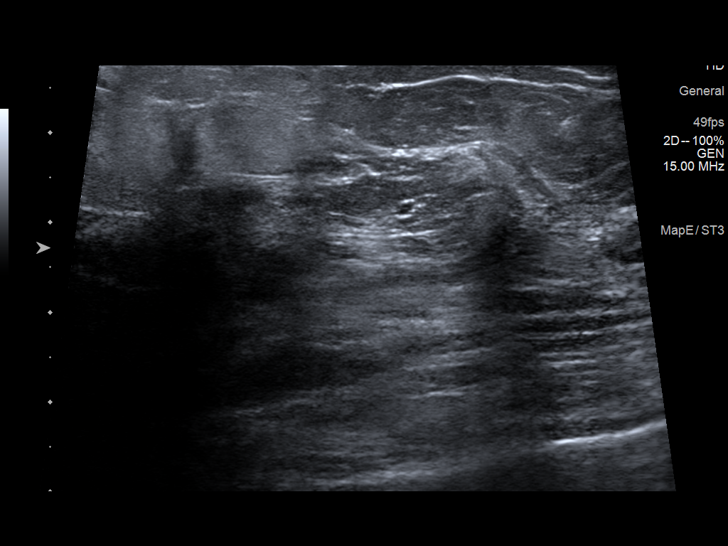
[im 21/25]
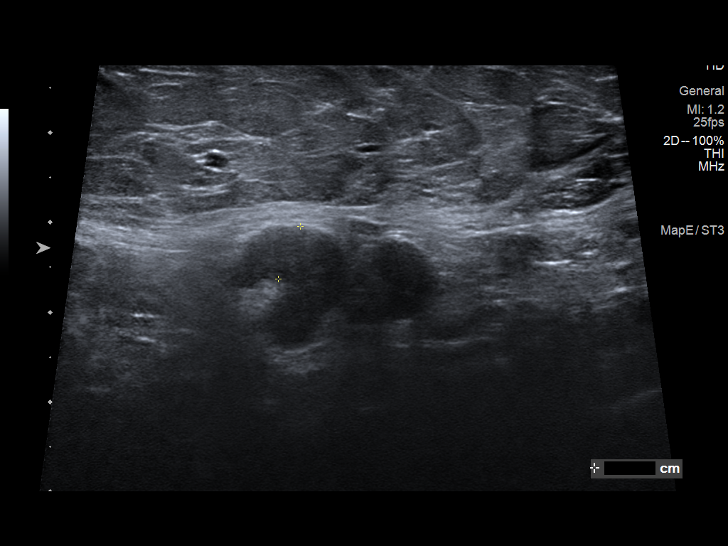
[im 23/25]
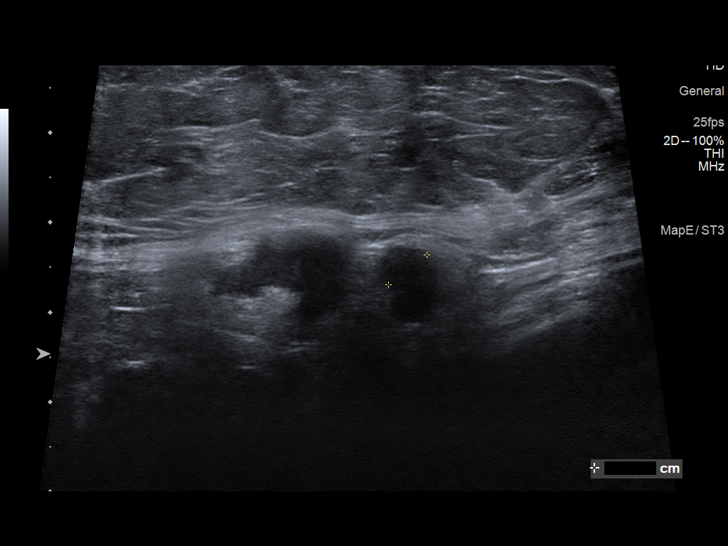
[im 25/25]
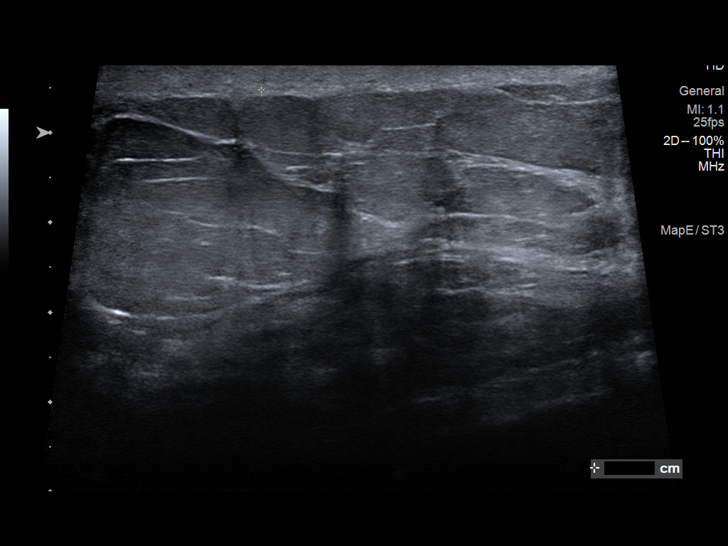

[13 of 25 positions shown; findings below may reference images not displayed]

ACR Breast Density Category b: There are scattered areas of
fibroglandular density.
FINDINGS: 2D/3D full field views of both breasts and spot compression view of
the LEFT breast demonstrate a dominant irregular UPPER-OUTER LEFT
breast mass containing pleomorphic calcifications.

A smaller irregular mass is identified posterolateral to the
dominant mass containing calcifications.

Anterior LEFT breast skin thickening is noted.

No suspicious mammographic abnormalities are noted within the RIGHT
breast.

On physical exam, a very firm mass is identified at the [DATE]
position of the LEFT breast 5 cm from the nipple.

Targeted ultrasound of the LEFT breast/axilla is performed, showing
the following:

A 3.9 x 3.6 x 4.3 cm irregular hypoechoic mass at the [DATE] position
5 cm from the nipple.

A 2.3 x 1.8 x 2.7 cm irregular mass at the 2 o'clock position 8 cm
from the nipple.

The 2 masses above encompass an area measuring at least 6.1 cm.

Anterior LEFT breast skin thickening.

Three abnormal LEFT axillary lymph nodes with cortical thickening.
IMPRESSION: 1. Highly suspicious 4.3 cm mass at the [DATE] position of the LEFT
breast and highly suspicious 2.7 cm mass at the 2 o'clock position
of the LEFT breast, with the 2 masses encompassing an area measuring
at least 6.1 cm. Tissue sampling of both of these masses
recommended.
2. 3 abnormal LEFT axillary lymph nodes with cortical thickening,
tissue sampling of 1 of these lymph nodes recommended.
3. Anterior LEFT breast skin thickening nonspecific but may reflect
dermal involvement.
4. No mammographic evidence of RIGHT breast malignancy.

RECOMMENDATION:
Ultrasound-guided biopsies of LEFT breast mass at the [DATE] position,
LEFT breast mass at the 2 o'clock position and 1 of the abnormal
LEFT axillary lymph nodes.

I have discussed the findings and recommendations with the patient.
If applicable, a reminder letter will be sent to the patient
regarding the next appointment.

BI-RADS CATEGORY  5: Highly suggestive of malignancy.

## 2020-07-31 ENCOUNTER — Ambulatory Visit
Admission: RE | Admit: 2020-07-31 | Discharge: 2020-07-31 | Disposition: A | Discharge status: Home / Self Care | Payer: Medicare Other | Source: Ambulatory Visit | Attending: Emergency Medicine | Admitting: Emergency Medicine

## 2020-07-31 ENCOUNTER — Other Ambulatory Visit: Payer: Self-pay

## 2020-07-31 DIAGNOSIS — N63 Unspecified lump in unspecified breast: Secondary | ICD-10-CM

## 2020-07-31 IMAGING — US US BREAST BX W LOC DEV 1ST LESION IMG BX SPEC US GUIDE*L*
1 series · 15 of 25 positions shown · non-contrast
Comparison: Previous exam(s).
COMPARISON: Previous exam(s).

Addendum:
CLINICAL DATA: 80-year-old female presenting for ultrasound-guided
biopsy of 2 left breast masses and a left axillary lymph node.

EXAM:
ULTRASOUND GUIDED LEFT BREAST CORE NEEDLE BIOPSY

[Series 1: us breast bx w loc dev 1st lesion img bx spec us g · 0.08mm/px · 15 of 34 slices shown]
[im 1/34]
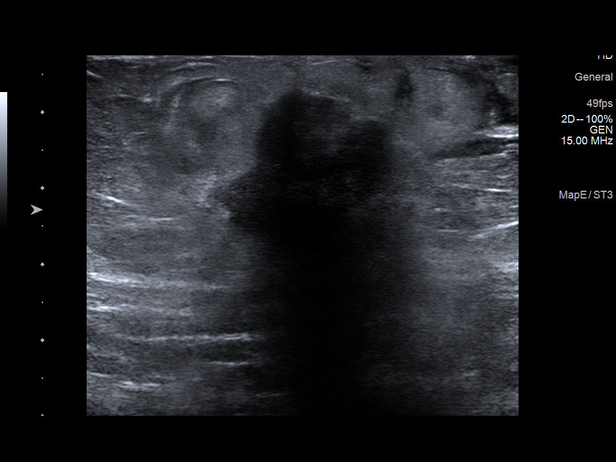
[im 3/34]
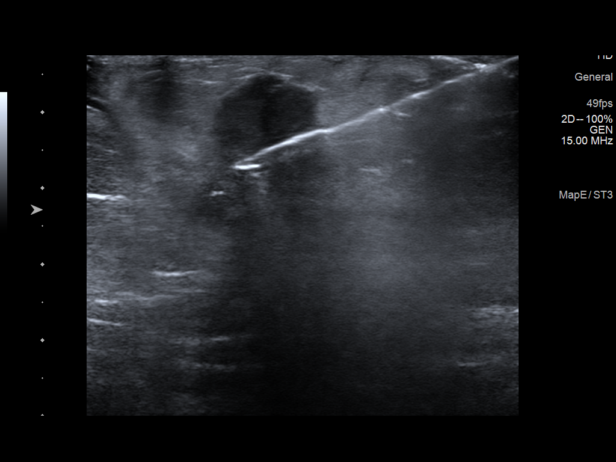
[im 6/34]
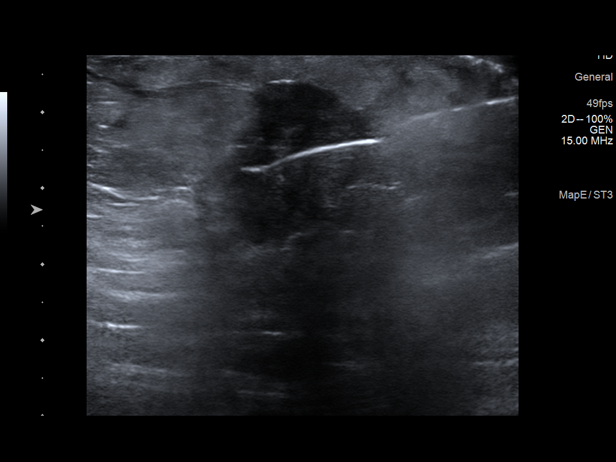
[im 7/34]
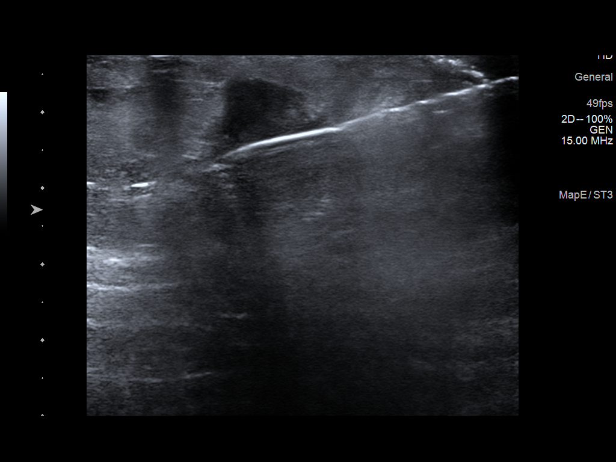
[im 10/34]
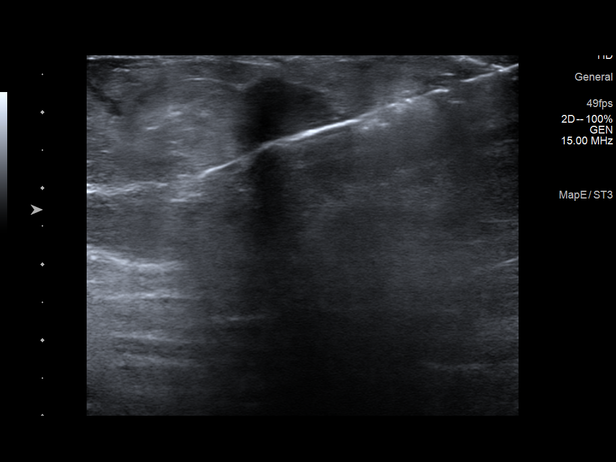
[im 13/34]
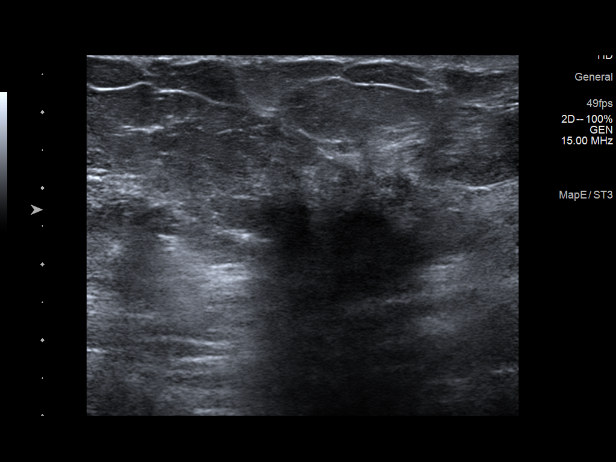
[im 14/34]
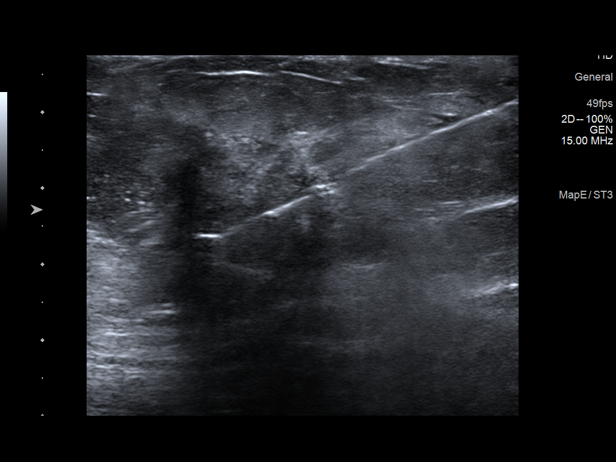
[im 17/34]
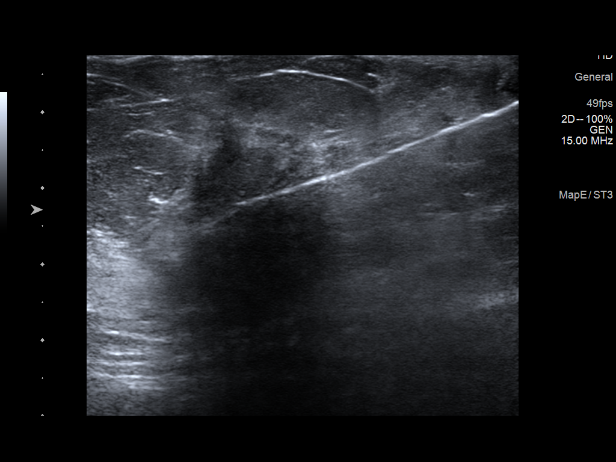
[im 20/34]
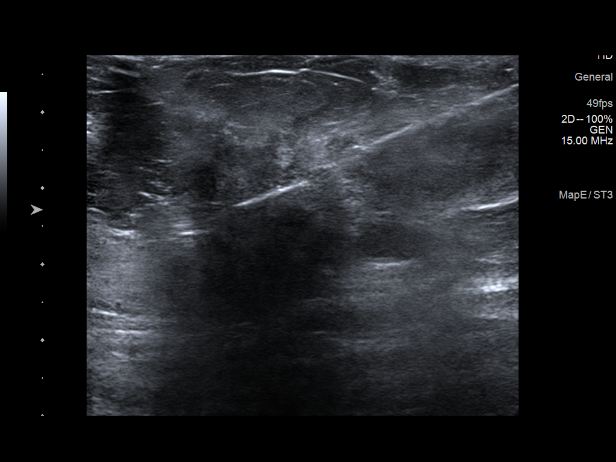
[im 21/34]
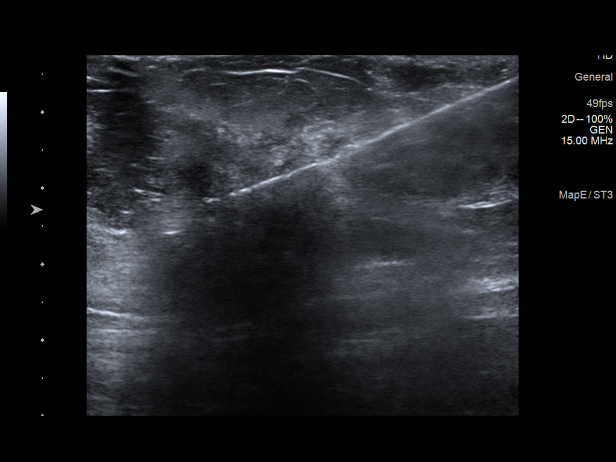
[im 24/34]
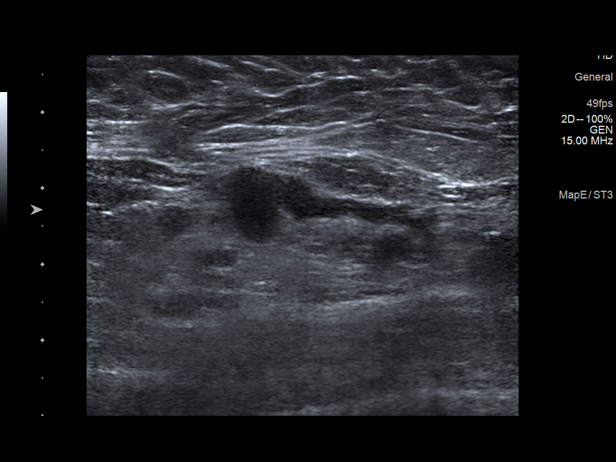
[im 27/34]
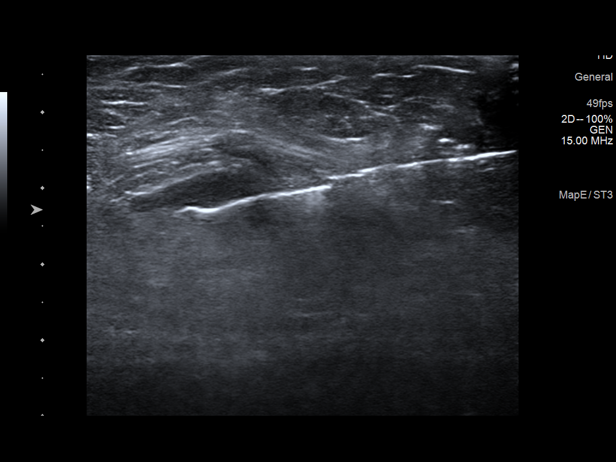
[im 28/34]
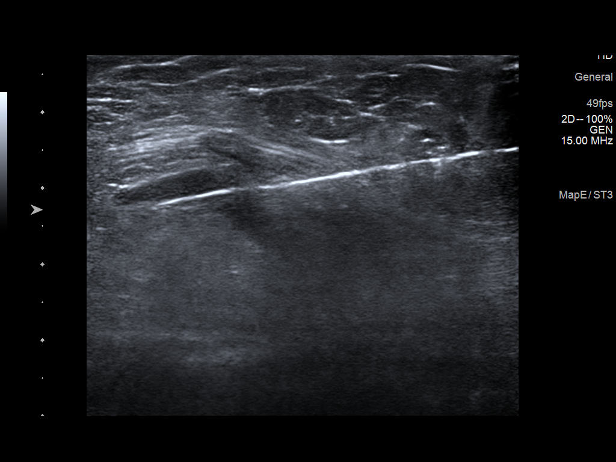
[im 31/34]
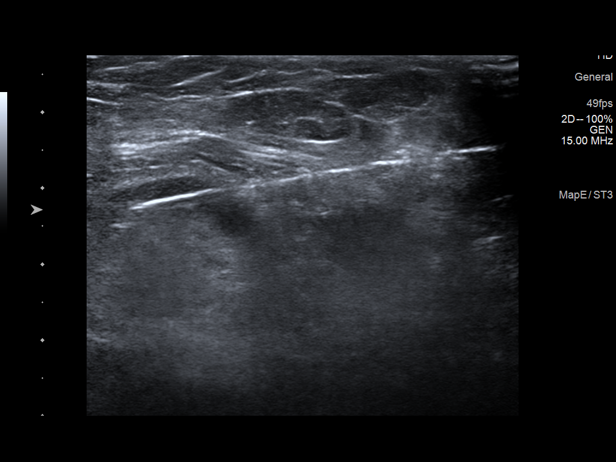
[im 34/34]
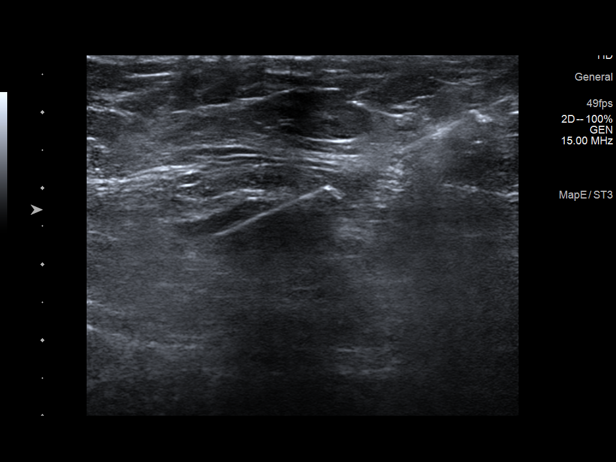

[15 of 25 positions shown; findings below may reference images not displayed]



#1 Lesion quadrant: Upper outer quadrant

Using sterile technique and 1% Lidocaine as local anesthetic, under
direct ultrasound visualization, a 14 gauge BODELO device was
used to perform biopsy of a mass in the left breast at [DATE], 5 cm
from the nipple using an inferior approach. At the conclusion of the
procedure a ribbon shaped tissue marker clip was deployed into the
biopsy cavity.

--------------------------------------------------------------------------------------------------------------------------------------------

#2 Lesion quadrant: Upper outer quadrant

Using sterile technique and 1% Lidocaine as local anesthetic, under
direct ultrasound visualization, a 14 gauge BODELO device was
used to perform biopsy of a mass in the left breast at 2 o'clock, 8
cm from the nipple using an inferior approach. At the conclusion of
the procedure a coil shaped tissue marker clip was deployed into the
biopsy cavity.

--------------------------------------------------------------------------------------------------------------------------------------------

#3 Lesion quadrant: Left axilla

Using sterile technique and 1% Lidocaine as local anesthetic, under
direct ultrasound visualization, a 14 gauge BODELO device was
used to perform biopsy of a left axillary lymph node using an
inferior approach. At the conclusion of the procedure a tribell
tissue marker clip was deployed into the biopsy cavity.

Follow up 2 view mammogram was performed and dictated separately.
IMPRESSION: 1. Ultrasound guided biopsy of a left breast mass at [DATE], 5 cm from
the nipple. No apparent complications.

2. Ultrasound guided biopsy of a left breast mass at 2 o'clock, 8 cm
from the nipple. No apparent complications.

3. Ultrasound guided biopsy of a left axillary lymph node. No
apparent complications.

ADDENDUM:
Pathology revealed GRADE II-III INVASIVE DUCTAL CARCINOMA of the
Left breast, 1:30 o'clock, [OK]. This was found to be concordant by
Dr. BODELO.

Pathology revealed GRADE II-III INVASIVE DUCTAL CARCINOMA, HIGH
GRADE DUCTAL CARCINOMA IN SITU of the Left breast, 2 o'clock, 8
cmfn. This was found to be concordant by Dr. BODELO.

Pathology revealed METASTATIC CARCINOMA IN A LYMPH NODE of the Left
axilla. This was found to be concordant by Dr. BODELO.

Pathology results were discussed with the patient by telephone. The
patient reported doing well after the biopsies with tenderness at
the sites. Post biopsy instructions and care were reviewed and
questions were answered. The patient was encouraged to call The
direct phone number was provided.

Surgical consultation has been arranged with Dr. BODELO at
[REDACTED] on [DATE].

Pathology results reported by BODELO, RN on [DATE].



#1 Lesion quadrant: Upper outer quadrant

Using sterile technique and 1% Lidocaine as local anesthetic, under
direct ultrasound visualization, a 14 gauge BODELO device was
used to perform biopsy of a mass in the left breast at [DATE], 5 cm
from the nipple using an inferior approach. At the conclusion of the
procedure a ribbon shaped tissue marker clip was deployed into the
biopsy cavity.

--------------------------------------------------------------------------------------------------------------------------------------------

#2 Lesion quadrant: Upper outer quadrant

Using sterile technique and 1% Lidocaine as local anesthetic, under
direct ultrasound visualization, a 14 gauge BODELO device was
used to perform biopsy of a mass in the left breast at 2 o'clock, 8
cm from the nipple using an inferior approach. At the conclusion of
the procedure a coil shaped tissue marker clip was deployed into the
biopsy cavity.

--------------------------------------------------------------------------------------------------------------------------------------------

#3 Lesion quadrant: Left axilla

Using sterile technique and 1% Lidocaine as local anesthetic, under
direct ultrasound visualization, a 14 gauge BODELO device was
used to perform biopsy of a left axillary lymph node using an
inferior approach. At the conclusion of the procedure a tribell
tissue marker clip was deployed into the biopsy cavity.

Follow up 2 view mammogram was performed and dictated separately.
IMPRESSION: 1. Ultrasound guided biopsy of a left breast mass at [DATE], 5 cm from
the nipple. No apparent complications.

2. Ultrasound guided biopsy of a left breast mass at 2 o'clock, 8 cm
from the nipple. No apparent complications.

3. Ultrasound guided biopsy of a left axillary lymph node. No
apparent complications.

## 2020-07-31 IMAGING — MG MM BREAST LOCALIZATION CLIP
4 series · 4 of 12 positions shown · non-contrast
Comparison: Previous exam(s).

CLINICAL DATA: Post biopsy mammogram of the left breast for clip
placement.

EXAM:
DIAGNOSTIC LEFT MAMMOGRAM POST ULTRASOUND BIOPSY

[L CC synth-2D]
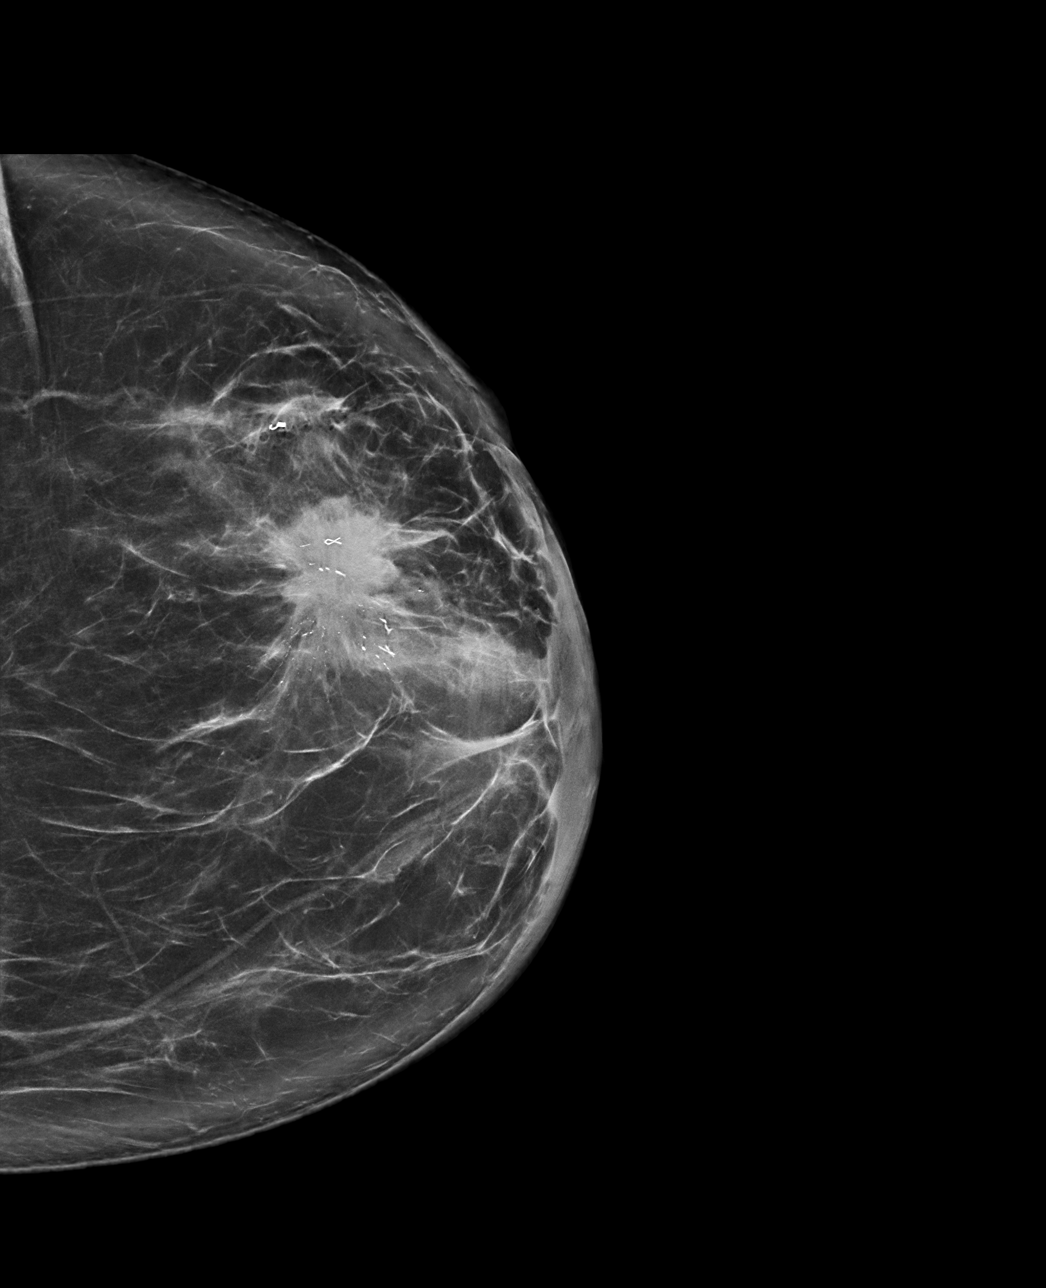

[L ML synth-2D]
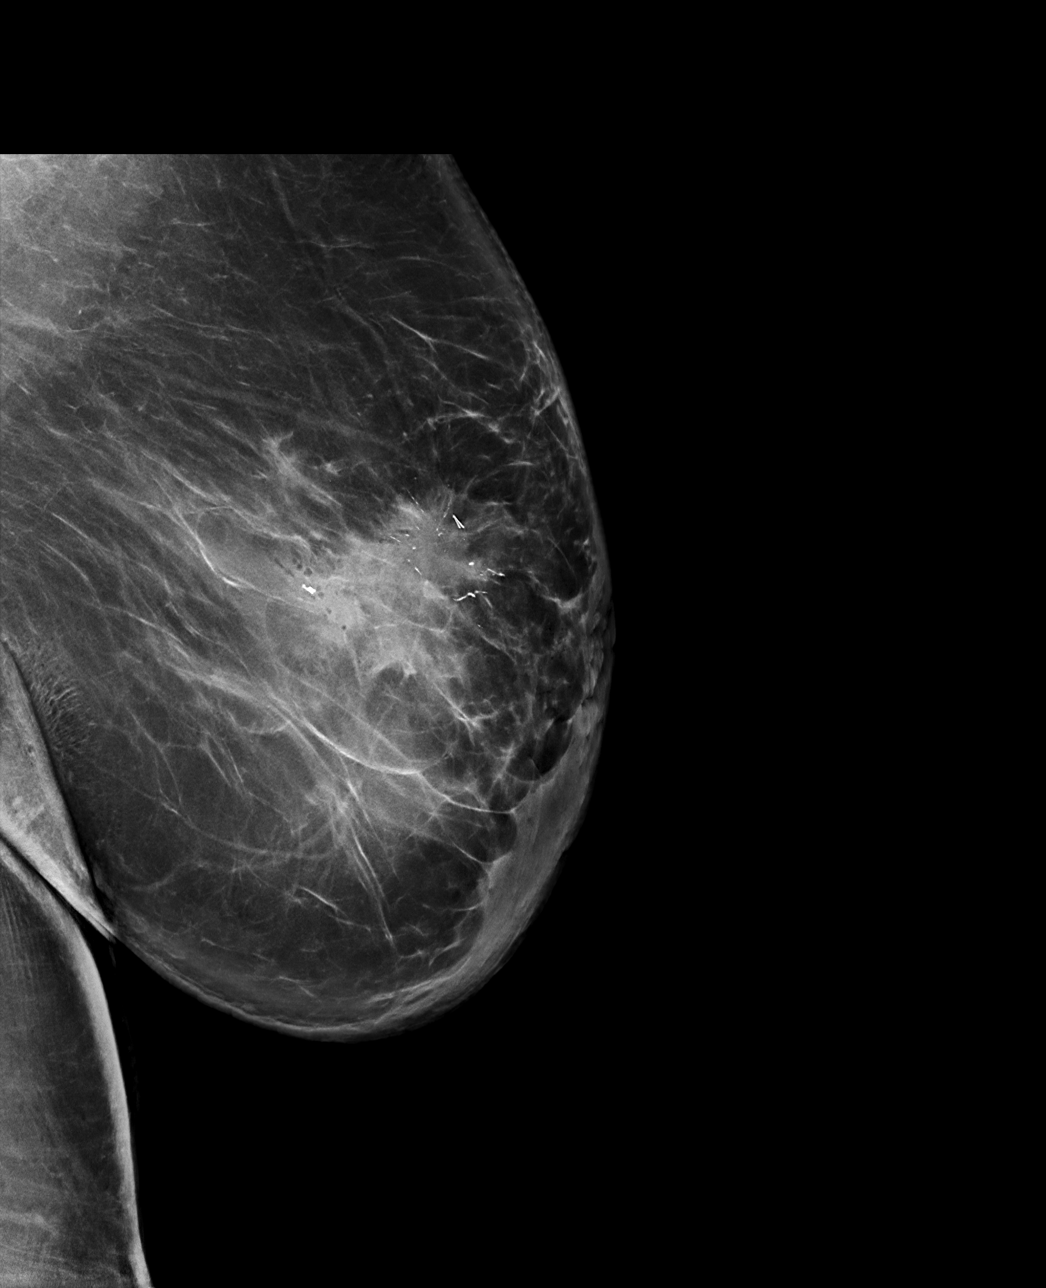

[L ML tomo · tomo slice 53/105.0]
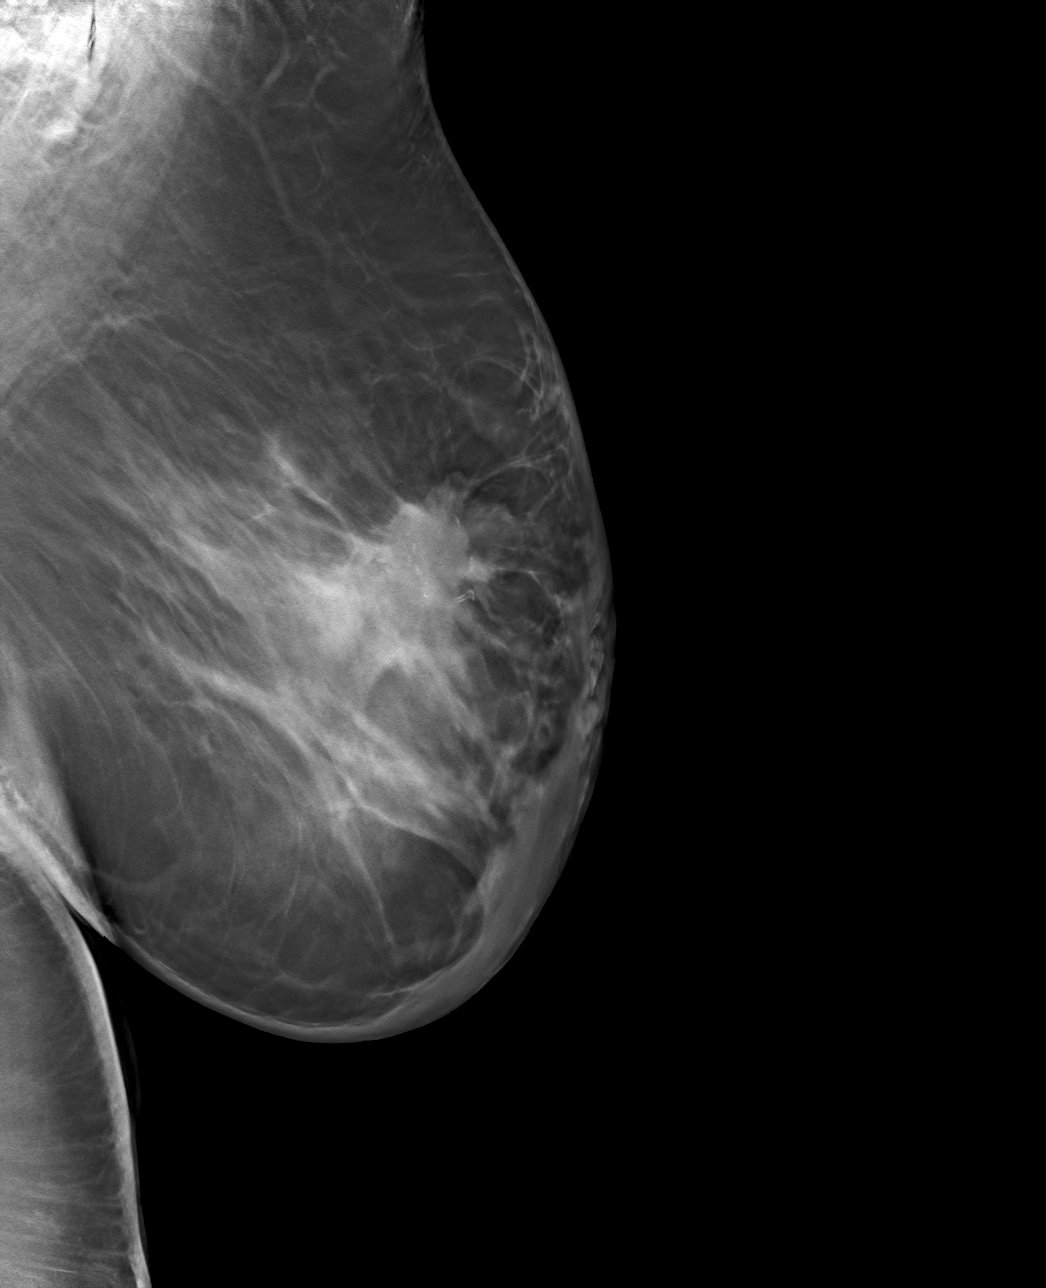

[L CC tomo · tomo slice 45/90.0]
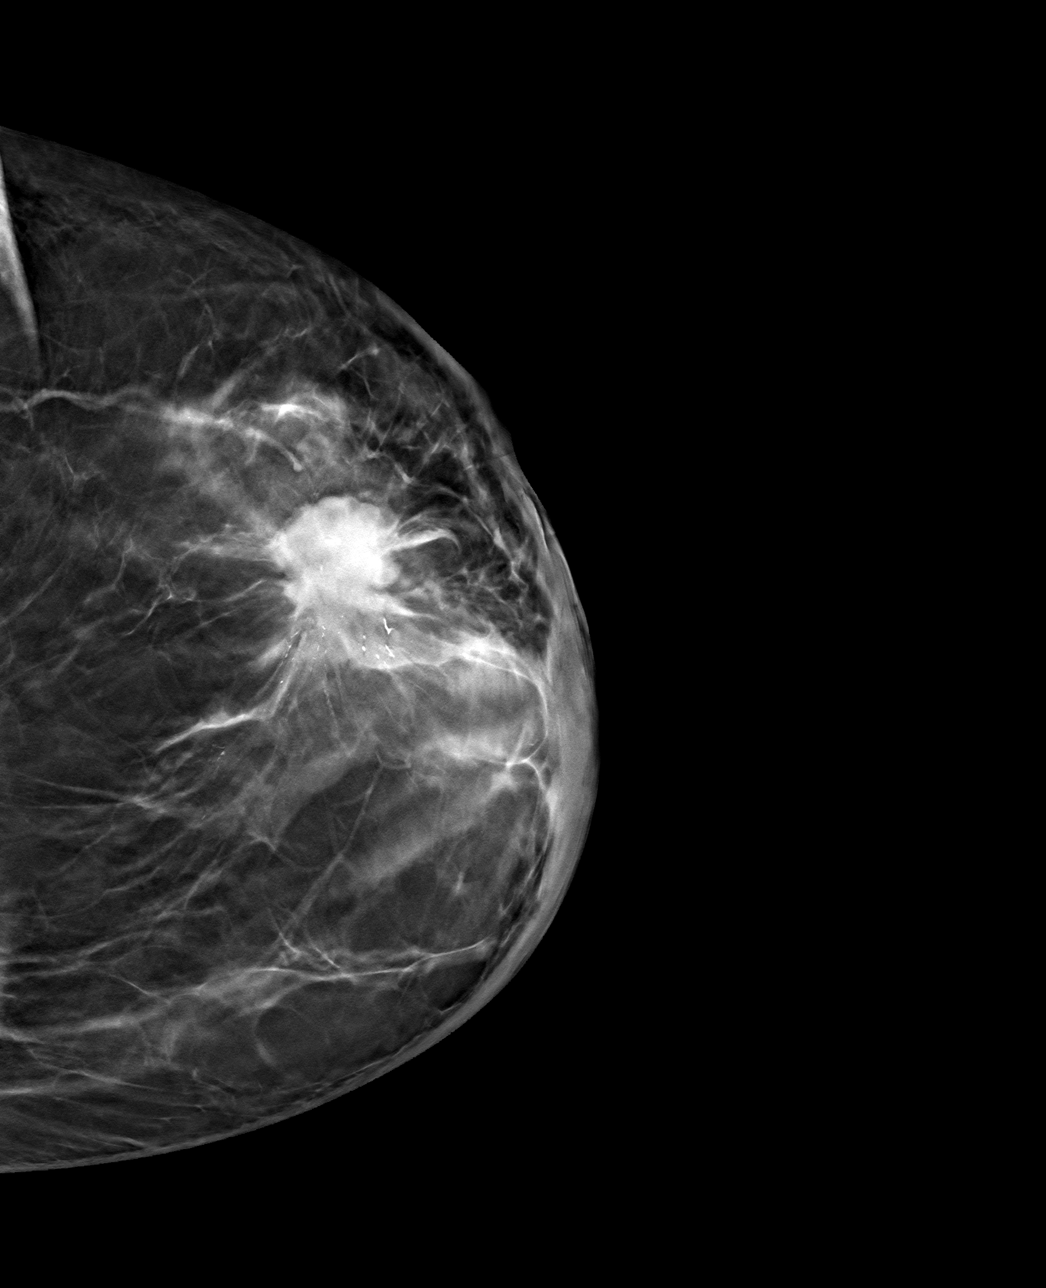

[4 of 12 positions shown; findings below may reference images not displayed]

FINDINGS: Mammographic images were obtained following ultrasound guided biopsy
of 2 left breast masses and a left axillary lymph node. The biopsy
marking clips are in expected position at the sites of biopsy.
IMPRESSION: 1. Appropriate positioning of the ribbon shaped biopsy marking clip
at the site of biopsy in the upper-outer left breast.

2. Appropriate positioning of the coil shaped biopsy marking clip at
the site of biopsy in the upper-outer left breast.

3. Appropriate positioning of the tribell shaped biopsy marking clip
at the site of biopsy in the left axilla.

Final Assessment: Post Procedure Mammograms for Marker Placement

## 2020-08-07 ENCOUNTER — Telehealth: Payer: Self-pay | Admitting: Nurse Practitioner

## 2020-08-07 NOTE — Telephone Encounter (Signed)
Received a new pt referral from Dr. Marlou Starks for a new dx of breast cancer. Christy [name] has been cld and scheduled to see Lacie on 6/9 at 11am. Pt awar to arrive 15 minutes early.

## 2020-08-10 ENCOUNTER — Encounter: Payer: Self-pay | Admitting: *Deleted

## 2020-08-12 NOTE — Progress Notes (Addendum)
[place]   [phone] Fax:([phone] [place] Note   Patient Care Team: Nolene Ebbs, MD as PCP - General (Internal Medicine) Mauro Kaufmann, RN as Oncology Nurse Navigator Rockwell Germany, RN as Oncology Nurse Navigator 08/13/2020  CHIEF COMPLAINTS/PURPOSE OF CONSULTATION:  Left breast cancer, referred by Dr. Autumn Messing  SUMMARY OF ONCOLOGY HISTORY  Oncology History  Malignant neoplasm of upper-outer quadrant of female breast ([place])  07/24/2020 Breast US   On physical exam, a very firm mass is identified at the 1:30 position of the LEFT breast 5 cm from the nipple.   Targeted ultrasound of the LEFT breast/axilla I   A 3.9 x 3.6 x 4.3 cm irregular hypoechoic mass at the 1:30 position 5 cm from the nipple. A 2.3 x 1.8 x 2.7 cm irregular mass at the 2 o'clock position 8 cm from the nipple. The 2 masses above encompass an area measuring at least 6.1 cm. Anterior LEFT breast skin thickening. Three abnormal LEFT axillary lymph nodes with cortical thickening.   IMPRESSION: 1. Highly suspicious 4.3 cm mass at the 1:30 position of the LEFT breast and highly suspicious 2.7 cm mass at the 2 o'clock position of the LEFT breast, with the 2 masses encompassing an area measuring at least 6.1 cm. Tissue sampling of both of these masses recommended. 2. 3 abnormal LEFT axillary lymph nodes with cortical thickening, tissue sampling of 1 of these lymph nodes recommended. 3. Anterior LEFT breast skin thickening nonspecific but may reflect dermal involvement. 4. No mammographic evidence of RIGHT breast malignancy.   07/31/2020 Initial Biopsy   Diagnosis 1. Breast, left, needle core biopsy, 1 :30 pm, 5cmfn - INVASIVE DUCTAL CARCINOMA, SEE COMMENT. 2. Breast, left, needle core biopsy, 2 o'clock, 8cmfn - INVASIVE DUCTAL CARCINOMA, SEE COMMENT. - DUCTAL CARCINOMA IN SITU. 3. Lymph node, needle/core biopsy, left axillary - METASTATIC CARCINOMA IN  A LYMPH NODE. Microscopic Comment 1. and 3. The carcinoma in both parts has a similar morphology and appears grade 2-3. The DCIS in part 2 is high-grade with necrosis. The carcinoma measures 15 mm (part 1) and 14 mm (part 2) in greatest linear extent  2. PROGNOSTIC INDICATORS Results: IMMUNOHISTOCHEMICAL AND MORPHOMETRIC ANALYSIS PERFORMED MANUALLY The tumor cells are POSITIVE for Her2 (3+). Estrogen Receptor: 0%, NEGATIVE Progesterone Receptor: 0%, NEGATIVE Proliferation Marker Ki67: 20%    07/31/2020 Cancer Staging   Staging form: Breast, AJCC 8th Edition - Clinical stage from 07/31/2020: Stage IIB (cT2, cN1, cM0, G2, ER-, PR-, HER2+) - Signed by Alla Feeling, NP on 08/13/2020  Stage prefix: Initial diagnosis  Nuclear grade: G2  Histologic grading system: 3 grade system    08/13/2020 Initial Diagnosis   Malignant neoplasm of upper-outer quadrant of female breast ([place])     HISTORY OF PRESENTING ILLNESS:  Christy [name] 80 y.o. female with PMH including HTN and DM is here because of newly diagnosed left breast cancer.  She initially presented to [facility] urgent care on 06/12/2020 with palpable painful left lateral breast mass, unsure how long it's been there. Diagnostic mammogram and ultrasound on 07/24/2020 showed a highly suspicious 4.3 cm mass in the left breast 1:30 position and highly suspicious 2 cm mass at the 2 o'clock position, 2 masses encompassing an area of at least 6.1 cm with skin thickening.  There were 3 abnormal left axillary lymph nodes.  The right breast was benign.  She underwent biopsy on 07/31/2020, path of the 130 and 2:00 left  breast masses showed invasive ductal carcinoma, grade 2-3, and additional high-grade DCIS with necrosis and a 2:00 mass.  The lymph node core biopsy showed metastatic carcinoma.  Prognostic indicators showed ER 0%, negative, PR 0%, negative, HER2 positive (3+), and a Ki-67 of 20%.  She was seen by Dr. Marlou Starks who recommends neoadjuvant therapy if  she can tolerate it, if she cannot tolerate he would recommend modified radical mastectomy.  GYN HISTORY  Menarchal: 14 LMP: 40's HRT: none G2P1, late miscarriage Last mammogram in 2003 when she retired   Financial planner, she is widowed, moved from [place], [place] to [place] in 2021. 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  She is independent with ADLs but does not drive, uses scat.  She falls a lot due to spinal stenosis, she has limited mobility, uses assistive devices.  Family history significant for sister who had metastatic breast cancer to brain, diagnosed in her 52s and died in 52, that sister's daughter (patient's niece) also had cancer likely breast.  Today she presents with her daughter.  She has noticed mild fatigue and low appetite lately, unaware that she lost 14 pounds since 06/2019.  She has mild pain in the left breast without skin erythema or nipple discharge. She has spinal stenosis, diagnosed in [place] and chronic low back pain. She has been falling a lot lately, more than usual. If she bends down can't get up. Doesn't use assistive devices.  Left leg is more of a problem at baseline, but right has been weaker lately.  Daughter admits to some memory issues. Denies neuropathy. Denies recent fever, chills, cough, chest pain, headache.    MEDICAL HISTORY:  Past Medical History:  Diagnosis Date   Diabetes mellitus without complication ([place])    Hyperlipidemia    Hypertension     SURGICAL HISTORY: Past Surgical History:  Procedure Laterality Date   FOOT FRACTURE SURGERY Right     SOCIAL HISTORY: Social History   Socioeconomic History   Marital status: Single    Spouse name: Not on file   Number of children: Not on file   Years of education: Not on file   Highest education level: Not on file  Occupational History   Not on file  Tobacco Use   Smoking status: Former    Pack years: 0.00   Smokeless  tobacco: Never  Vaping Use   Vaping Use: Never used  Substance and Sexual Activity   Alcohol use: Not Currently   Drug use: Never   Sexual activity: Not on file  Other Topics Concern   Not on file  Social History Narrative   Not on file   Social Determinants of Health   Financial Resource Strain: Not on file  Food Insecurity: Not on file  Transportation Needs: Not on file  Physical Activity: Not on file  Stress: Not on file  Social Connections: Not on file  Intimate Partner Violence: Not At Risk   Fear of Current or Ex-Partner: No   Emotionally Abused: No   Physically Abused: No   Sexually Abused: No    FAMILY HISTORY: Family History  Problem Relation Age of Onset   Cancer Sister 65       Breast   Cancer Niece        Possibly breast    ALLERGIES:  has No Known Allergies.  MEDICATIONS:  Current Outpatient Medications  Medication Sig Dispense Refill   amLODipine (NORVASC) 5 MG tablet  cetirizine (ZYRTEC ALLERGY) 10 MG tablet Take 1 tablet (10 mg total) by mouth daily. 30 tablet 0   clotrimazole (CLOTRIMAZOLE AF) 1 % cream Apply 1 application topically 2 (two) times daily. 30 g 0   fluticasone (FLONASE) 50 MCG/ACT nasal spray Place 1 spray into both nostrils daily. 1 g 0   metFORMIN (GLUCOPHAGE) 500 MG tablet Take 1 tablet (500 mg total) by mouth 2 (two) times daily with a meal. 60 tablet 1   olmesartan (BENICAR) 40 MG tablet      No current facility-administered medications for this visit.    REVIEW OF SYSTEMS:   Constitutional: Denies fevers, chills or abnormal night sweats (+) fatigue (+) low appetite/weight loss Eyes: Denies blurriness of vision, double vision or watery eyes Ears, nose, mouth, throat, and face: Denies mucositis or sore throat Respiratory: Denies cough, dyspnea or wheezes Cardiovascular: Denies palpitation, chest discomfort or lower extremity swelling Gastrointestinal:  Denies nausea, heartburn or change in bowel habits Skin: Denies  abnormal skin rashes Lymphatics: Denies new lymphadenopathy or easy bruising Neurological:Denies numbness, tingling or new weaknesses (+) spinal stenosis (+) chronic back pain (+) frequent fall Behavioral/Psych: Mood is stable, no new changes (+) memory issues All other systems were reviewed with the patient and are negative.  PHYSICAL EXAMINATION: ECOG PERFORMANCE STATUS: 1 - Symptomatic but completely ambulatory  Vitals:   08/13/20 1034  BP: (!) 181/69  Pulse: 65  Resp: 18  Temp: 97.9 F (36.6 C)  SpO2: 99%   Filed Weights   08/13/20 1034  Weight: 194 lb 11.2 oz (88.3 kg)    GENERAL:alert, no distress and comfortable SKIN: no rash  EYES: sclera clear NECK: without mass LYMPH:  no palpable cervical or supraclavicular lymphadenopathy  LUNGS: clear with normal breathing effort HEART: regular rate & rhythm, no lower extremity edema Musculoskeletal: No focal spinal tenderness PSYCH: alert & oriented x 3 with fluent speech NEURO: no focal motor/sensory deficits Breast exam: Inspection shows them to be mostly symmetric without nipple discharge.  Palpable firm mass in the central and upper outer left breast measuring 3.5 x 5 in largest dimension with mild skin edema, no erythema or open wound.  2 cm palpable left axillary lymph node.  Right breast benign.  LABORATORY DATA:  I have reviewed the data as listed CBC Latest Ref Rng & Units 06/25/2019  WBC 4.0 - 10.5 K/uL 6.0  Hemoglobin 12.0 - 15.0 g/dL 12.2  Hematocrit 36.0 - 46.0 % 37.5  Platelets 150 - 400 K/uL 285   CMP Latest Ref Rng & Units 06/25/2019  Glucose 70 - 99 mg/dL 104(H)  BUN 8 - 23 mg/dL 14  Creatinine 0.44 - 1.00 mg/dL 1.47(H)  Sodium 135 - 145 mmol/L 143  Potassium 3.5 - 5.1 mmol/L 4.3  Chloride 98 - 111 mmol/L 110  CO2 22 - 32 mmol/L 25  Calcium 8.9 - 10.3 mg/dL 10.1  Total Protein 6.5 - 8.1 g/dL 7.0  Total Bilirubin 0.3 - 1.2 mg/dL 0.4  Alkaline Phos 38 - 126 U/L 57  AST 15 - 41 U/L 17  ALT 0 - 44 U/L  14     RADIOGRAPHIC STUDIES: I have personally reviewed the radiological images as listed and agreed with the findings in the report. US BREAST LTD UNI LEFT INC AXILLA  Result Date: 07/24/2020 CLINICAL DATA:  80 year old female with palpable LEFT breast mass discovered on self-examination, and for new baseline bilateral mammogram. EXAM: DIGITAL DIAGNOSTIC BILATERAL MAMMOGRAM WITH TOMOSYNTHESIS AND CAD; ULTRASOUND LEFT BREAST LIMITED TECHNIQUE:  Bilateral digital diagnostic mammography and breast tomosynthesis was performed. The images were evaluated with computer-aided detection.; Targeted ultrasound examination of the left breast was performed COMPARISON:  None. ACR Breast Density Category b: There are scattered areas of fibroglandular density. FINDINGS: 2D/3D full field views of both breasts and spot compression view of the LEFT breast demonstrate a dominant irregular UPPER-OUTER LEFT breast mass containing pleomorphic calcifications. A smaller irregular mass is identified posterolateral to the dominant mass containing calcifications. Anterior LEFT breast skin thickening is noted. No suspicious mammographic abnormalities are noted within the RIGHT breast. On physical exam, a very firm mass is identified at the 1:30 position of the LEFT breast 5 cm from the nipple. Targeted ultrasound of the LEFT breast/axilla is performed, showing the following: A 3.9 x 3.6 x 4.3 cm irregular hypoechoic mass at the 1:30 position 5 cm from the nipple. A 2.3 x 1.8 x 2.7 cm irregular mass at the 2 o'clock position 8 cm from the nipple. The 2 masses above encompass an area measuring at least 6.1 cm. Anterior LEFT breast skin thickening. Three abnormal LEFT axillary lymph nodes with cortical thickening. IMPRESSION: 1. Highly suspicious 4.3 cm mass at the 1:30 position of the LEFT breast and highly suspicious 2.7 cm mass at the 2 o'clock position of the LEFT breast, with the 2 masses encompassing an area measuring at least 6.1 cm.  Tissue sampling of both of these masses recommended. 2. 3 abnormal LEFT axillary lymph nodes with cortical thickening, tissue sampling of 1 of these lymph nodes recommended. 3. Anterior LEFT breast skin thickening nonspecific but may reflect dermal involvement. 4. No mammographic evidence of RIGHT breast malignancy. RECOMMENDATION: Ultrasound-guided biopsies of LEFT breast mass at the 1:30 position, LEFT breast mass at the 2 o'clock position and 1 of the abnormal LEFT axillary lymph nodes. I have discussed the findings and recommendations with the patient. If applicable, a reminder letter will be sent to the patient regarding the next appointment. BI-RADS CATEGORY  5: Highly suggestive of malignancy. Electronically Signed   By: Margarette Canada M.D.   On: 07/24/2020 14:22   MM DIAG BREAST TOMO BILATERAL  Result Date: 07/24/2020 CLINICAL DATA:  80 year old female with palpable LEFT breast mass discovered on self-examination, and for new baseline bilateral mammogram. EXAM: DIGITAL DIAGNOSTIC BILATERAL MAMMOGRAM WITH TOMOSYNTHESIS AND CAD; ULTRASOUND LEFT BREAST LIMITED TECHNIQUE: Bilateral digital diagnostic mammography and breast tomosynthesis was performed. The images were evaluated with computer-aided detection.; Targeted ultrasound examination of the left breast was performed COMPARISON:  None. ACR Breast Density Category b: There are scattered areas of fibroglandular density. FINDINGS: 2D/3D full field views of both breasts and spot compression view of the LEFT breast demonstrate a dominant irregular UPPER-OUTER LEFT breast mass containing pleomorphic calcifications. A smaller irregular mass is identified posterolateral to the dominant mass containing calcifications. Anterior LEFT breast skin thickening is noted. No suspicious mammographic abnormalities are noted within the RIGHT breast. On physical exam, a very firm mass is identified at the 1:30 position of the LEFT breast 5 cm from the nipple. Targeted  ultrasound of the LEFT breast/axilla is performed, showing the following: A 3.9 x 3.6 x 4.3 cm irregular hypoechoic mass at the 1:30 position 5 cm from the nipple. A 2.3 x 1.8 x 2.7 cm irregular mass at the 2 o'clock position 8 cm from the nipple. The 2 masses above encompass an area measuring at least 6.1 cm. Anterior LEFT breast skin thickening. Three abnormal LEFT axillary lymph nodes with cortical thickening. IMPRESSION: 1.  Highly suspicious 4.3 cm mass at the 1:30 position of the LEFT breast and highly suspicious 2.7 cm mass at the 2 o'clock position of the LEFT breast, with the 2 masses encompassing an area measuring at least 6.1 cm. Tissue sampling of both of these masses recommended. 2. 3 abnormal LEFT axillary lymph nodes with cortical thickening, tissue sampling of 1 of these lymph nodes recommended. 3. Anterior LEFT breast skin thickening nonspecific but may reflect dermal involvement. 4. No mammographic evidence of RIGHT breast malignancy. RECOMMENDATION: Ultrasound-guided biopsies of LEFT breast mass at the 1:30 position, LEFT breast mass at the 2 o'clock position and 1 of the abnormal LEFT axillary lymph nodes. I have discussed the findings and recommendations with the patient. If applicable, a reminder letter will be sent to the patient regarding the next appointment. BI-RADS CATEGORY  5: Highly suggestive of malignancy. Electronically Signed   By: Margarette Canada M.D.   On: 07/24/2020 14:22   [place] AXILLARY NODE CORE BIOPSY LEFT  Addendum Date: 08/04/2020   ADDENDUM REPORT: 08/04/2020 14:04 ADDENDUM: Pathology revealed GRADE II-III INVASIVE DUCTAL CARCINOMA of the Left breast, 1:30 o'clock, 5cmfn. This was found to be concordant by Dr. Ammie Ferrier. Pathology revealed GRADE II-III INVASIVE DUCTAL CARCINOMA, HIGH GRADE DUCTAL CARCINOMA IN SITU of the Left breast, 2 o'clock, 8 cmfn. This was found to be concordant by Dr. Ammie Ferrier. Pathology revealed METASTATIC CARCINOMA IN A LYMPH NODE of the  Left axilla. This was found to be concordant by Dr. Ammie Ferrier. Pathology results were discussed with the patient by telephone. The patient reported doing well after the biopsies with tenderness at the sites. Post biopsy instructions and care were reviewed and questions were answered. The patient was encouraged to call [facility] for any additional concerns. My direct phone number was provided. Surgical consultation has been arranged with Dr. Autumn Messing at [facility] on August 06, 2020. Pathology results reported by Terie Purser, RN on 08/04/2020. Electronically Signed   By: Ammie Ferrier M.D.   On: 08/04/2020 14:04   Result Date: 08/04/2020 CLINICAL DATA:  80 year old female presenting for ultrasound-guided biopsy of 2 left breast masses and a left axillary lymph node. EXAM: ULTRASOUND GUIDED LEFT BREAST CORE NEEDLE BIOPSY COMPARISON:  Previous exam(s). FINDINGS: I met with the patient and we discussed the procedure of ultrasound-guided biopsy, including benefits and alternatives. We discussed the high likelihood of a successful procedure. We discussed the risks of the procedure, including infection, bleeding, tissue injury, clip migration, and inadequate sampling. Informed written consent was given. The usual time-out protocol was performed immediately prior to the procedure. #1 Lesion quadrant: Upper outer quadrant Using sterile technique and 1% Lidocaine as local anesthetic, under direct ultrasound visualization, a 14 gauge spring-loaded device was used to perform biopsy of a mass in the left breast at 1:30, 5 cm from the nipple using an inferior approach. At the conclusion of the procedure a ribbon shaped tissue marker clip was deployed into the biopsy cavity. -------------------------------------------------------------------------------------------------------------------------------------------- #2 Lesion quadrant: Upper outer quadrant Using sterile technique  and 1% Lidocaine as local anesthetic, under direct ultrasound visualization, a 14 gauge spring-loaded device was used to perform biopsy of a mass in the left breast at 2 o'clock, 8 cm from the nipple using an inferior approach. At the conclusion of the procedure a coil shaped tissue marker clip was deployed into the biopsy cavity. -------------------------------------------------------------------------------------------------------------------------------------------- #3 Lesion quadrant: Left axilla Using sterile technique and 1% Lidocaine as local anesthetic, under  direct ultrasound visualization, a 14 gauge spring-loaded device was used to perform biopsy of a left axillary lymph node using an inferior approach. At the conclusion of the procedure a tribell tissue marker clip was deployed into the biopsy cavity. Follow up 2 view mammogram was performed and dictated separately. IMPRESSION: 1. Ultrasound guided biopsy of a left breast mass at 1:30, 5 cm from the nipple. No apparent complications. 2. Ultrasound guided biopsy of a left breast mass at 2 o'clock, 8 cm from the nipple. No apparent complications. 3. Ultrasound guided biopsy of a left axillary lymph node. No apparent complications. Electronically Signed: By: Ammie Ferrier M.D. On: 07/31/2020 14:07   MM CLIP PLACEMENT LEFT  Result Date: 07/31/2020 CLINICAL DATA:  Post biopsy mammogram of the left breast for clip placement. EXAM: DIAGNOSTIC LEFT MAMMOGRAM POST ULTRASOUND BIOPSY COMPARISON:  Previous exam(s). FINDINGS: Mammographic images were obtained following ultrasound guided biopsy of 2 left breast masses and a left axillary lymph node. The biopsy marking clips are in expected position at the sites of biopsy. IMPRESSION: 1. Appropriate positioning of the ribbon shaped biopsy marking clip at the site of biopsy in the upper-outer left breast. 2. Appropriate positioning of the coil shaped biopsy marking clip at the site of biopsy in the upper-outer  left breast. 3. Appropriate positioning of the tribell shaped biopsy marking clip at the site of biopsy in the left axilla. Final Assessment: Post Procedure Mammograms for Marker Placement Electronically Signed   By: Ammie Ferrier M.D.   On: 07/31/2020 14:31   [place] LT BREAST BX W LOC DEV 1ST LESION IMG BX SPEC US GUIDE  Addendum Date: 08/04/2020   ADDENDUM REPORT: 08/04/2020 14:04 ADDENDUM: Pathology revealed GRADE II-III INVASIVE DUCTAL CARCINOMA of the Left breast, 1:30 o'clock, 5cmfn. This was found to be concordant by Dr. Ammie Ferrier. Pathology revealed GRADE II-III INVASIVE DUCTAL CARCINOMA, HIGH GRADE DUCTAL CARCINOMA IN SITU of the Left breast, 2 o'clock, 8 cmfn. This was found to be concordant by Dr. Ammie Ferrier. Pathology revealed METASTATIC CARCINOMA IN A LYMPH NODE of the Left axilla. This was found to be concordant by Dr. Ammie Ferrier. Pathology results were discussed with the patient by telephone. The patient reported doing well after the biopsies with tenderness at the sites. Post biopsy instructions and care were reviewed and questions were answered. The patient was encouraged to call [facility] for any additional concerns. My direct phone number was provided. Surgical consultation has been arranged with Dr. Autumn Messing at [facility] Surgery on August 06, 2020. Pathology results reported by Terie Purser, RN on 08/04/2020. Electronically Signed   By: Ammie Ferrier M.D.   On: 08/04/2020 14:04   Result Date: 08/04/2020 CLINICAL DATA:  80 year old female presenting for ultrasound-guided biopsy of 2 left breast masses and a left axillary lymph node. EXAM: ULTRASOUND GUIDED LEFT BREAST CORE NEEDLE BIOPSY COMPARISON:  Previous exam(s). FINDINGS: I met with the patient and we discussed the procedure of ultrasound-guided biopsy, including benefits and alternatives. We discussed the high likelihood of a successful procedure. We discussed the risks of the  procedure, including infection, bleeding, tissue injury, clip migration, and inadequate sampling. Informed written consent was given. The usual time-out protocol was performed immediately prior to the procedure. #1 Lesion quadrant: Upper outer quadrant Using sterile technique and 1% Lidocaine as local anesthetic, under direct ultrasound visualization, a 14 gauge spring-loaded device was used to perform biopsy of a mass in the left breast at 1:30, 5 cm  from the nipple using an inferior approach. At the conclusion of the procedure a ribbon shaped tissue marker clip was deployed into the biopsy cavity. -------------------------------------------------------------------------------------------------------------------------------------------- #2 Lesion quadrant: Upper outer quadrant Using sterile technique and 1% Lidocaine as local anesthetic, under direct ultrasound visualization, a 14 gauge spring-loaded device was used to perform biopsy of a mass in the left breast at 2 o'clock, 8 cm from the nipple using an inferior approach. At the conclusion of the procedure a coil shaped tissue marker clip was deployed into the biopsy cavity. -------------------------------------------------------------------------------------------------------------------------------------------- #3 Lesion quadrant: Left axilla Using sterile technique and 1% Lidocaine as local anesthetic, under direct ultrasound visualization, a 14 gauge spring-loaded device was used to perform biopsy of a left axillary lymph node using an inferior approach. At the conclusion of the procedure a tribell tissue marker clip was deployed into the biopsy cavity. Follow up 2 view mammogram was performed and dictated separately. IMPRESSION: 1. Ultrasound guided biopsy of a left breast mass at 1:30, 5 cm from the nipple. No apparent complications. 2. Ultrasound guided biopsy of a left breast mass at 2 o'clock, 8 cm from the nipple. No apparent complications. 3.  Ultrasound guided biopsy of a left axillary lymph node. No apparent complications. Electronically Signed: By: Ammie Ferrier M.D. On: 07/31/2020 14:07   [place] LT BREAST BX W LOC DEV EA ADD LESION IMG BX SPEC US GUIDE  Addendum Date: 08/04/2020   ADDENDUM REPORT: 08/04/2020 14:04 ADDENDUM: Pathology revealed GRADE II-III INVASIVE DUCTAL CARCINOMA of the Left breast, 1:30 o'clock, 5cmfn. This was found to be concordant by Dr. Ammie Ferrier. Pathology revealed GRADE II-III INVASIVE DUCTAL CARCINOMA, HIGH GRADE DUCTAL CARCINOMA IN SITU of the Left breast, 2 o'clock, 8 cmfn. This was found to be concordant by Dr. Ammie Ferrier. Pathology revealed METASTATIC CARCINOMA IN A LYMPH NODE of the Left axilla. This was found to be concordant by Dr. Ammie Ferrier. Pathology results were discussed with the patient by telephone. The patient reported doing well after the biopsies with tenderness at the sites. Post biopsy instructions and care were reviewed and questions were answered. The patient was encouraged to call [facility] for any additional concerns. My direct phone number was provided. Surgical consultation has been arranged with Dr. Autumn Messing at [facility] on August 06, 2020. Pathology results reported by Terie Purser, RN on 08/04/2020. Electronically Signed   By: Ammie Ferrier M.D.   On: 08/04/2020 14:04   Result Date: 08/04/2020 CLINICAL DATA:  80 year old female presenting for ultrasound-guided biopsy of 2 left breast masses and a left axillary lymph node. EXAM: ULTRASOUND GUIDED LEFT BREAST CORE NEEDLE BIOPSY COMPARISON:  Previous exam(s). FINDINGS: I met with the patient and we discussed the procedure of ultrasound-guided biopsy, including benefits and alternatives. We discussed the high likelihood of a successful procedure. We discussed the risks of the procedure, including infection, bleeding, tissue injury, clip migration, and inadequate sampling. Informed  written consent was given. The usual time-out protocol was performed immediately prior to the procedure. #1 Lesion quadrant: Upper outer quadrant Using sterile technique and 1% Lidocaine as local anesthetic, under direct ultrasound visualization, a 14 gauge spring-loaded device was used to perform biopsy of a mass in the left breast at 1:30, 5 cm from the nipple using an inferior approach. At the conclusion of the procedure a ribbon shaped tissue marker clip was deployed into the biopsy cavity. -------------------------------------------------------------------------------------------------------------------------------------------- #2 Lesion quadrant: Upper outer quadrant Using sterile technique and 1% Lidocaine as local  anesthetic, under direct ultrasound visualization, a 14 gauge spring-loaded device was used to perform biopsy of a mass in the left breast at 2 o'clock, 8 cm from the nipple using an inferior approach. At the conclusion of the procedure a coil shaped tissue marker clip was deployed into the biopsy cavity. -------------------------------------------------------------------------------------------------------------------------------------------- #3 Lesion quadrant: Left axilla Using sterile technique and 1% Lidocaine as local anesthetic, under direct ultrasound visualization, a 14 gauge spring-loaded device was used to perform biopsy of a left axillary lymph node using an inferior approach. At the conclusion of the procedure a tribell tissue marker clip was deployed into the biopsy cavity. Follow up 2 view mammogram was performed and dictated separately. IMPRESSION: 1. Ultrasound guided biopsy of a left breast mass at 1:30, 5 cm from the nipple. No apparent complications. 2. Ultrasound guided biopsy of a left breast mass at 2 o'clock, 8 cm from the nipple. No apparent complications. 3. Ultrasound guided biopsy of a left axillary lymph node. No apparent complications. Electronically Signed: By:  Ammie Ferrier M.D. 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  She initially self palpated a left breast mass in 06/2020, possibly earlier.  Work-up showed 2 masses in the left upper outer breast and 3 abnormal lymph nodes.  Biopsy of the 2 masses and 1 lymph node all showed invasive ductal carcinoma, grade 2-3, ER/PR negative and HER2 positive. -We reviewed the aggressive nature of stage II ER/PR negative and HER2 positive disease and the high risk for recurrence after surgery. -We are recommending PET scan to complete staging.  Due to her back pain and leg weakness, and multiple frequent falls, we are referring her for brain MRI to rule out metastatic disease. -We reviewed neoadjuvant versus adjuvant chemotherapy and anti-HER2 therapy.  We discussed the benefit of neoadjuvant treatment is to measure treatment response and possibly downstage her cancer, to potentially lessen the complexity of surgery and reduce the recurrence risk in the future.  -We discussed neoadjuvant Taxol/Herceptin/Perjeta vs Kadcyla for 3-6 months.  Due to her age, limited mobility and frequent fall, she is not an ideal candidate for intensive chemo.   -Dr. Burr Medico recommends Kadcyla every 3 weeks for 3-6 months before surgery, then continue maintenance Kadcyla every 3 weeks to complete 1 year.  -We would recommend a Port-A-Cath, but okay to do first 1 or 2 cycles peripherally for scheduling purposes.  She is being referred for baseline echo, lab, and chemo class in the next few weeks. -we discussed the option of upfront surgery followed by adjuvant kadcyla, but Dr. Marlou Starks would recommend modified radical mastectomy in that  case.  -She will think about her treatment options and let us know her decision -She has been referred to radiation oncology. Case was reviewed in breast conference this week.  -Due to her ER/PR negative disease, she would not benefit from antiestrogen therapy. -will f/up on staging work up, lab, echo, and her treatment decision.  -F/up pending her decision.  2.  Genetics -Her sister had metastatic breast cancer in her 22s, and that sisters daughter possibly have breast cancer.  -Due to family history and ER/PR negative disease, patient qualifies for genetics.  We will discuss at next visit -Patient's daughter  was present, I reviewed her breast cancer risk and the importance of annual mammography.  She understands  3.?  Spinal stenosis -Reportedly diagnosed in [place], [place].  Patient has chronic back pain and left leg weakness at baseline -Recently she developed right leg weakness and frequent fall -PET and brain MRI to r/o metastatic disease   4. HTN, DM -on metformin, amlodipine, and olmesartan -f/up PCP Dr. Jeanie Cooks -Denies baseline neuropathy   PLAN: -Work up reviewed -PET and brain MRI to complete staging -Baseline lab and echo, and chemo class 1-2 weeks -Can defer PAC placement for now -Patient will consider her options and let us know -F/up pending treatment decision, to start neoadjuvant chemo or f/up after surgery if she declines neoadjuvant treatment    Orders Placed This Encounter  Procedures   NM PET Image Initial (PI) Skull Base To Thigh    Standing Status:   Future    Standing Expiration Date:   08/13/2021    Order Specific Question:   If indicated for the ordered procedure, I authorize the administration of a radiopharmaceutical per Radiology protocol    Answer:   Yes    Order Specific Question:   Preferred imaging location?    Answer:   [facility]   MR Brain W Wo Contrast    Standing Status:   Future    Standing Expiration Date:   08/13/2021    Order Specific  Question:   If indicated for the ordered procedure, I authorize the administration of contrast media per Radiology protocol    Answer:   Yes    Order Specific Question:   What is the patient's sedation requirement?    Answer:   No Sedation    Order Specific Question:   Does the patient have a pacemaker or implanted devices?    Answer:   No    Order Specific Question:   Use SRS Protocol?    Answer:   No    Order Specific Question:   Preferred imaging location?    Answer:   [facility] (table limit - 550 lbs)   MR BREAST BILATERAL W WO CONTRAST INC CAD    Standing Status:   Future    Standing Expiration Date:   08/13/2021    Order Specific Question:   If indicated for the ordered procedure, I authorize the administration of contrast media per Radiology protocol    Answer:   Yes    Order Specific Question:   What is the patient's sedation requirement?    Answer:   No Sedation    Order Specific Question:   Does the patient have a pacemaker or implanted devices?    Answer:   No    Order Specific Question:   Preferred imaging location?    Answer:   [facility] (table limit - 550 lbs)   CMP ([facility] only)    Standing Status:   Standing    Number of Occurrences:   50    Standing Expiration Date:   08/13/2021   CBC with Differential (Cancer Center Only)    Standing Status:   Standing    Number of Occurrences:   50    Standing Expiration Date:   08/13/2021   CA 27.29    Standing Status:   Standing    Number of Occurrences:   50    Standing Expiration Date:   08/13/2021   ECHOCARDIOGRAM COMPLETE    Standing Status:   Future    Standing  Expiration Date:   08/13/2021    Order Specific Question:   Where should this test be performed    Answer:   [facility]    Order Specific Question:   Perflutren DEFINITY (image enhancing agent) should be administered unless hypersensitivity or allergy exist    Answer:   Administer Perflutren    Order Specific Question:   Reason for exam-Echo     Answer:   Chemo  Z09    Order Specific Question:   Other Comments    Answer:   baseline for her2 therapy    All questions were answered. The patient knows to call the clinic with any problems, questions or concerns.     Alla Feeling, NP 08/13/2020   Addendum  80 yo female with hypertension, diabetes, spinal stenosis, presented with palpable left breast mass.  I reviewed her image and the biopsy findings with patient and her daughter.  She has at least locally advanced stage II disease, I recommend PET scan and brain MRI (due to her frequent fall lately) to rule out distant metastasis.  We discussed the high risk of recurrence after surgical resection, and the benefit of neoadjuvant chemotherapy to downstage her cancer.  Due to her age, medical comorbidities, and the limited performance status, she is not a candidate for [facility], we discussed option of weekly Taxol, trastuzumab and pertuzumab, versus Kadcyla/Perjeta.  I think she can tolerate the Kadcyla better. Based on the I-SPI2 trail, the pCR was higher than Taxol/Herceptin (55% vs 25%, and similar to THP (56%).  I reviewed the benefit and side effects with her and her daughter in detail.  She would like to think about it and let us know about her decision.  She agrees with staging scan and echocardiogram. Will see her back after the scans.  Truitt Merle  08/14/2018

## 2020-08-13 ENCOUNTER — Other Ambulatory Visit: Payer: Self-pay

## 2020-08-13 ENCOUNTER — Encounter: Payer: Self-pay | Admitting: Nurse Practitioner

## 2020-08-13 ENCOUNTER — Telehealth: Payer: Self-pay

## 2020-08-13 ENCOUNTER — Telehealth: Payer: Self-pay | Admitting: Nurse Practitioner

## 2020-08-13 ENCOUNTER — Inpatient Hospital Stay: Payer: Medicare Other | Attending: Nurse Practitioner | Admitting: Nurse Practitioner

## 2020-08-13 DIAGNOSIS — E119 Type 2 diabetes mellitus without complications: Secondary | ICD-10-CM | POA: Diagnosis not present

## 2020-08-13 DIAGNOSIS — R296 Repeated falls: Secondary | ICD-10-CM | POA: Diagnosis not present

## 2020-08-13 DIAGNOSIS — R234 Changes in skin texture: Secondary | ICD-10-CM | POA: Diagnosis not present

## 2020-08-13 DIAGNOSIS — C773 Secondary and unspecified malignant neoplasm of axilla and upper limb lymph nodes: Secondary | ICD-10-CM | POA: Diagnosis not present

## 2020-08-13 DIAGNOSIS — C50412 Malignant neoplasm of upper-outer quadrant of left female breast: Secondary | ICD-10-CM | POA: Diagnosis not present

## 2020-08-13 DIAGNOSIS — I10 Essential (primary) hypertension: Secondary | ICD-10-CM

## 2020-08-13 DIAGNOSIS — E785 Hyperlipidemia, unspecified: Secondary | ICD-10-CM

## 2020-08-13 DIAGNOSIS — I6381 Other cerebral infarction due to occlusion or stenosis of small artery: Secondary | ICD-10-CM | POA: Diagnosis not present

## 2020-08-13 DIAGNOSIS — C7800 Secondary malignant neoplasm of unspecified lung: Secondary | ICD-10-CM | POA: Diagnosis not present

## 2020-08-13 DIAGNOSIS — R5383 Other fatigue: Secondary | ICD-10-CM

## 2020-08-13 DIAGNOSIS — I700 Atherosclerosis of aorta: Secondary | ICD-10-CM | POA: Insufficient documentation

## 2020-08-13 DIAGNOSIS — I6782 Cerebral ischemia: Secondary | ICD-10-CM | POA: Diagnosis not present

## 2020-08-13 DIAGNOSIS — M4800 Spinal stenosis, site unspecified: Secondary | ICD-10-CM

## 2020-08-13 DIAGNOSIS — R222 Localized swelling, mass and lump, trunk: Secondary | ICD-10-CM | POA: Diagnosis not present

## 2020-08-13 DIAGNOSIS — M545 Low back pain, unspecified: Secondary | ICD-10-CM | POA: Insufficient documentation

## 2020-08-13 DIAGNOSIS — Z171 Estrogen receptor negative status [ER-]: Secondary | ICD-10-CM

## 2020-08-13 DIAGNOSIS — G8929 Other chronic pain: Secondary | ICD-10-CM | POA: Diagnosis not present

## 2020-08-13 DIAGNOSIS — Z79899 Other long term (current) drug therapy: Secondary | ICD-10-CM | POA: Diagnosis not present

## 2020-08-13 DIAGNOSIS — Z803 Family history of malignant neoplasm of breast: Secondary | ICD-10-CM

## 2020-08-13 DIAGNOSIS — C50419 Malignant neoplasm of upper-outer quadrant of unspecified female breast: Secondary | ICD-10-CM | POA: Insufficient documentation

## 2020-08-13 NOTE — Telephone Encounter (Signed)
This nurse spoke with patient and made aware of her PET, MRI of Breast and Brain are scheduled for 6/24.  Advised patient to arrive at 10:30 for PET at 11.  Informed patient to be NPO 6 hours prior to appointment.  MRI of brain and breast also on 6/24 and she needs to arrive by 2:30.  Patient acknowledged understanding and agrees with appt date and times.  Patient knows to call clinic with any problems, questons or concerns.

## 2020-08-13 NOTE — Telephone Encounter (Signed)
Scheduled appointment per 06/09 sch msg. Patient is aware.

## 2020-08-14 ENCOUNTER — Telehealth: Payer: Self-pay | Admitting: Nurse Practitioner

## 2020-08-14 ENCOUNTER — Encounter: Payer: Self-pay | Admitting: Nurse Practitioner

## 2020-08-14 NOTE — Telephone Encounter (Signed)
Spoke with patient to give an updated appointment for her ECHO, told her the time and location to go, will make sure chemo education goes over location with her as well

## 2020-08-17 ENCOUNTER — Encounter: Payer: Self-pay | Admitting: *Deleted

## 2020-08-17 ENCOUNTER — Telehealth: Payer: Self-pay | Admitting: *Deleted

## 2020-08-17 NOTE — Telephone Encounter (Signed)
Left vm regarding navigation resources and contact information for questions or needs.

## 2020-08-18 ENCOUNTER — Telehealth: Payer: Self-pay

## 2020-08-18 NOTE — Telephone Encounter (Signed)
This nurse returned a call to patient requesting to know her appt times for 6/16.  This nurse left a message with appt dates and times.  Patient knows to call clinic with any problems, questons or concerns.

## 2020-08-20 ENCOUNTER — Inpatient Hospital Stay: Payer: Medicare Other

## 2020-08-20 ENCOUNTER — Other Ambulatory Visit: Payer: Self-pay

## 2020-08-20 DIAGNOSIS — C50412 Malignant neoplasm of upper-outer quadrant of left female breast: Secondary | ICD-10-CM | POA: Diagnosis not present

## 2020-08-20 LAB — CBC WITH DIFFERENTIAL (CANCER CENTER ONLY)
Abs Immature Granulocytes: 0.01 10*3/uL (ref 0.00–0.07)
Basophils Absolute: 0 10*3/uL (ref 0.0–0.1)
Basophils Relative: 1 %
Eosinophils Absolute: 0.2 10*3/uL (ref 0.0–0.5)
Eosinophils Relative: 5 %
HCT: 33.5 % — ABNORMAL LOW (ref 36.0–46.0)
Hemoglobin: 11.1 g/dL — ABNORMAL LOW (ref 12.0–15.0)
Immature Granulocytes: 0 %
Lymphocytes Relative: 40 %
Lymphs Abs: 1.8 10*3/uL (ref 0.7–4.0)
MCH: 24.2 pg — ABNORMAL LOW (ref 26.0–34.0)
MCHC: 33.1 g/dL (ref 30.0–36.0)
MCV: 73 fL — ABNORMAL LOW (ref 80.0–100.0)
Monocytes Absolute: 0.3 10*3/uL (ref 0.1–1.0)
Monocytes Relative: 6 %
Neutro Abs: 2.2 10*3/uL (ref 1.7–7.7)
Neutrophils Relative %: 48 %
Platelet Count: 267 10*3/uL (ref 150–400)
RBC: 4.59 MIL/uL (ref 3.87–5.11)
RDW: 15.6 % — ABNORMAL HIGH (ref 11.5–15.5)
WBC Count: 4.6 10*3/uL (ref 4.0–10.5)
nRBC: 0 % (ref 0.0–0.2)

## 2020-08-20 LAB — CMP (CANCER CENTER ONLY)
ALT: <6 U/L (ref 0–44)
AST: 10 U/L — ABNORMAL LOW (ref 15–41)
Albumin: 3.5 g/dL (ref 3.5–5.0)
Alkaline Phosphatase: 63 U/L (ref 38–126)
Anion gap: 9 (ref 5–15)
BUN: 18 mg/dL (ref 8–23)
CO2: 24 mmol/L (ref 22–32)
Calcium: 10.2 mg/dL (ref 8.9–10.3)
Chloride: 109 mmol/L (ref 98–111)
Creatinine: 1.71 mg/dL — ABNORMAL HIGH (ref 0.44–1.00)
GFR, Estimated: 30 mL/min — ABNORMAL LOW (ref >60)
Glucose, Bld: 101 mg/dL — ABNORMAL HIGH (ref 70–99)
Potassium: 4.1 mmol/L (ref 3.5–5.1)
Sodium: 142 mmol/L (ref 135–145)
Total Bilirubin: 0.4 mg/dL (ref 0.3–1.2)
Total Protein: 7 g/dL (ref 6.5–8.1)

## 2020-08-21 LAB — CANCER ANTIGEN 27.29: CA 27.29: 36.2 U/mL (ref 0.0–38.6)

## 2020-08-25 ENCOUNTER — Telehealth: Payer: Self-pay | Admitting: Nurse Practitioner

## 2020-08-25 NOTE — Telephone Encounter (Signed)
Called patient to f/up, to answer any questions and see how she's doing. Feels well, breast "pulls" and itches at times but denies pain or bleeding. She is waiting on scans before making any treatment decisions. Aware of imaging appts on 6/24, echo and f/up 6/27. She has no other needs at this time and appreciates the call.   Christy Rue, NP

## 2020-08-26 ENCOUNTER — Telehealth: Payer: Self-pay | Admitting: Internal Medicine

## 2020-08-26 NOTE — Telephone Encounter (Signed)
   FATE GALANTI DOB: 1941-01-21 MRN: [id number]   RIDER WAIVER AND RELEASE OF LIABILITY  For purposes of improving physical access to our facilities, [facility] is pleased to partner with third parties to provide [facility] patients or other authorized individuals the option of convenient, on-demand ground transportation services (the [place]") through use of the technology service that enables users to request on-demand ground transportation from independent third-party providers.  By opting to use and accept these [employer], I, the undersigned, hereby agree on behalf of myself, and on behalf of any minor child using the Government social research officer for whom I am the parent or legal guardian, as follows:  Government social research officer provided to me are provided by independent third-party transportation providers who are not [employer] or employees and who are unaffiliated with [employer]. [facility] is neither a transportation carrier nor a common or public carrier. [facility] has no control over the quality or safety of the transportation that occurs as a result of the [employer]. [facility] cannot guarantee that any third-party transportation provider will complete any arranged transportation service. [facility] makes no representation, warranty, or guarantee regarding the reliability, timeliness, quality, safety, suitability, or availability of any of the Transport Services or that they will be error free. I fully understand that traveling by vehicle involves risks and dangers of serious bodily injury, including permanent disability, paralysis, and death. I agree, on behalf of myself and on behalf of any minor child using the Transport Services for whom I am the parent or legal guardian, that the entire risk arising out of my use of the [employer] remains solely with me, to the maximum extent permitted under applicable law. The [employer] are provided "as  is" and "as available." [facility] disclaims all representations and warranties, express, implied or statutory, not expressly set out in these terms, including the implied warranties of merchantability and fitness for a particular purpose. I hereby waive and release [facility], its agents, employees, officers, directors, representatives, insurers, attorneys, assigns, successors, subsidiaries, and affiliates from any and all past, present, or future claims, demands, liabilities, actions, causes of action, or suits of any kind directly or indirectly arising from acceptance and use of the [employer]. I further waive and release [facility] and its affiliates from all present and future liability and responsibility for any injury or death to persons or damages to property caused by or related to the use of the [employer]. I have read this Waiver and Release of Liability, and I understand the terms used in it and their legal significance. This Waiver is freely and voluntarily given with the understanding that my right (as well as the right of any minor child for whom I am the parent or legal guardian using the [employer]) to legal recourse against [facility] in connection with the [employer] is knowingly surrendered in return for use of these services.   I attest that I read the consent document to Doylene Canard, gave Ms. Jeffreys the opportunity to ask questions and answered the questions asked (if any). I affirm that Doylene Canard then provided consent for she's participation in this program.     Katy Apo

## 2020-08-28 ENCOUNTER — Ambulatory Visit (HOSPITAL_COMMUNITY)
Admission: RE | Admit: 2020-08-28 | Discharge: 2020-08-28 | Disposition: A | Discharge status: Home / Self Care | Payer: Medicare Other | Source: Ambulatory Visit | Attending: Nurse Practitioner | Admitting: Nurse Practitioner

## 2020-08-28 ENCOUNTER — Other Ambulatory Visit: Payer: Self-pay

## 2020-08-28 DIAGNOSIS — C50412 Malignant neoplasm of upper-outer quadrant of left female breast: Secondary | ICD-10-CM | POA: Insufficient documentation

## 2020-08-28 DIAGNOSIS — Z171 Estrogen receptor negative status [ER-]: Secondary | ICD-10-CM | POA: Diagnosis present

## 2020-08-28 LAB — GLUCOSE, CAPILLARY: Glucose-Capillary: 99 mg/dL (ref 70–99)

## 2020-08-28 IMAGING — PT NM PET TUM IMG INITIAL (PI) SKULL BASE T - THIGH
8 of 9 series · 21 of 25 positions shown · non-contrast
Comparison: MRI of the brain dated [DATE].

CLINICAL DATA: Initial treatment strategy for LEFT breast cancer,
history of COVID vaccination.

EXAM:
NUCLEAR MEDICINE PET SKULL BASE TO THIGH
TECHNIQUE: 9.7 mCi F-18 FDG was injected intravenously. Full-ring PET imaging
was performed from the skull base to thigh after the radiotracer. CT
data was obtained and used for attenuation correction and anatomic
localization.
Fasting blood glucose: 99 mg/dl

[Series 3: pet sk_thigh ac · axial · 5.0mm · 4.07mm/px · z∈[-1066,-178]mm · 4 of 223 slices shown]
[im 1/223]
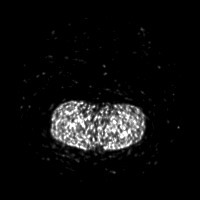
[im 56/223]
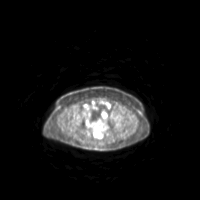
[im 112/223]
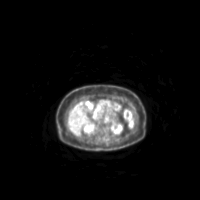
[im 223/223]
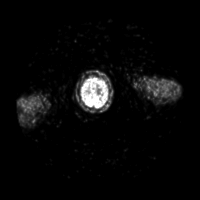

[Series 4: ct sk_thigh 5.0 bf37 · axial · 5.0mm · 0.98mm/px · z∈[-1066,-402]mm · 4 of 223 slices shown]
[im 1/223  brain]
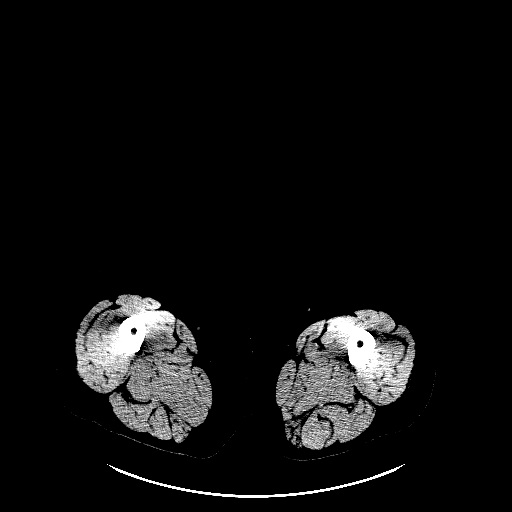
[im 56/223]
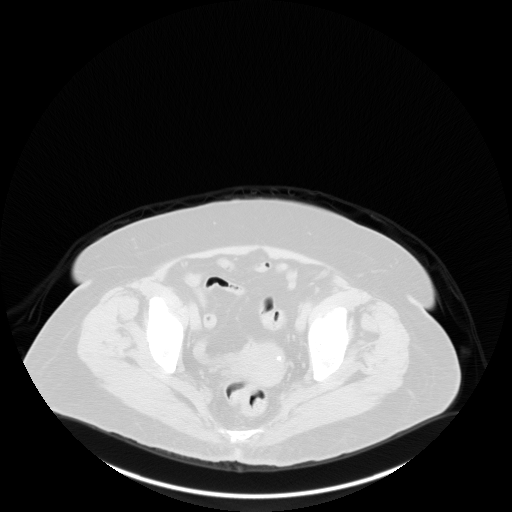
[im 112/223  brain]
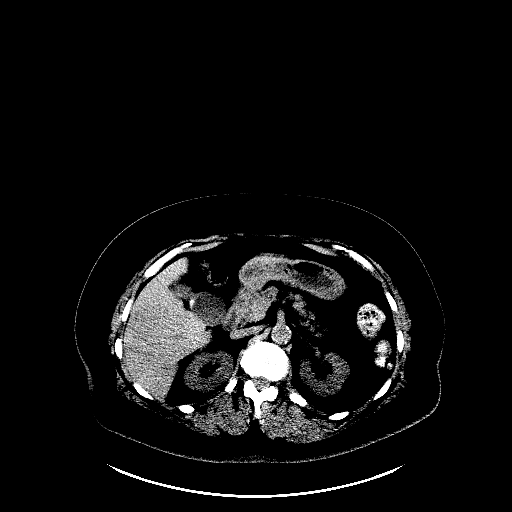
[im 167/223]
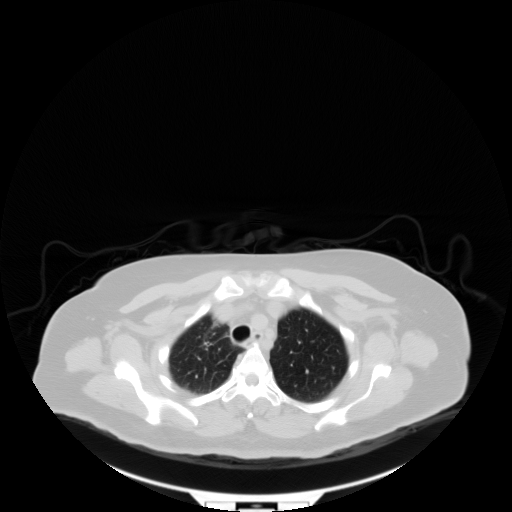

[Series 5: pet sk_thigh nac · axial · 5.0mm · 4.07mm/px · z∈[-1066,-178]mm · 5 of 223 slices shown]
[im 1/223]
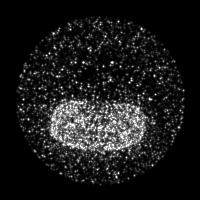
[im 56/223]
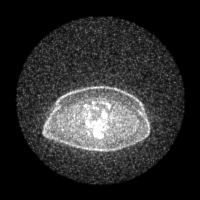
[im 112/223]
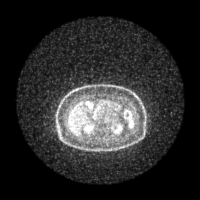
[im 167/223]
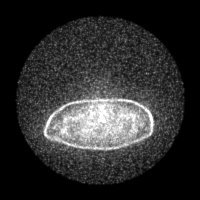
[im 223/223]
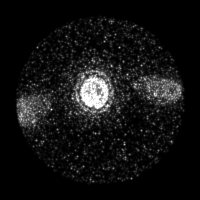

[Series 603: <mip collection> · coronal · 1.84mm/px · 1 of 32 slices shown]
[im 1/32]
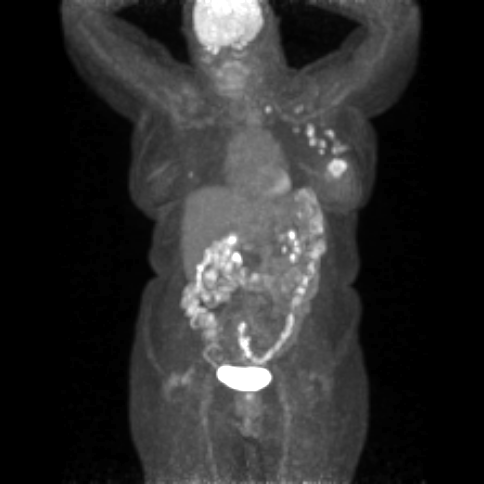

[Series 604: fused cor · 1 of 43 slices shown]
[im 1/43]
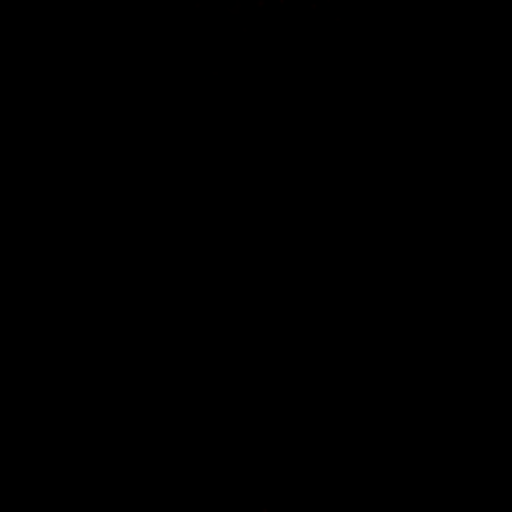

[Series 605: range-ct sk_thigh 5.0 bf37-tra-<alpha range> · 4 of 216 slices shown]
[im 1/216]
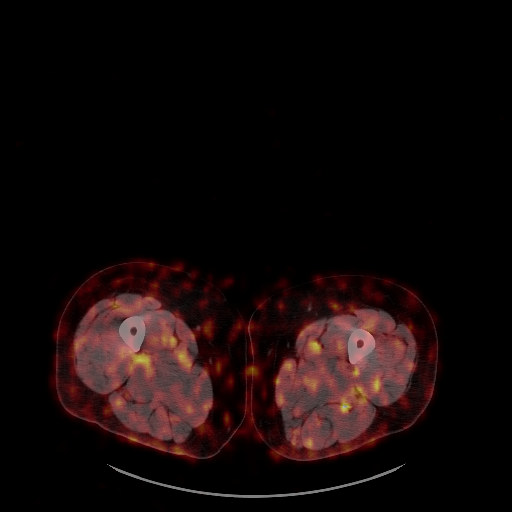
[im 54/216]
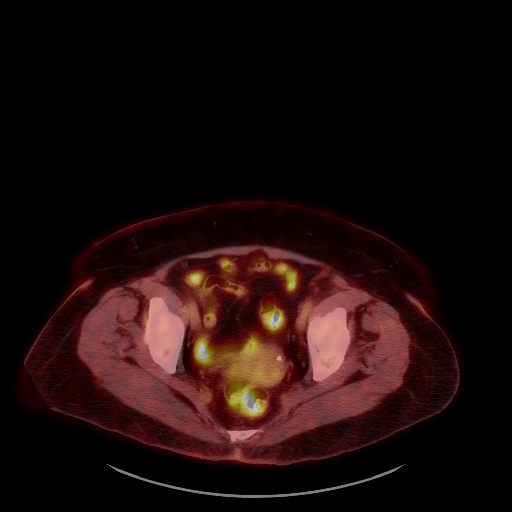
[im 108/216]
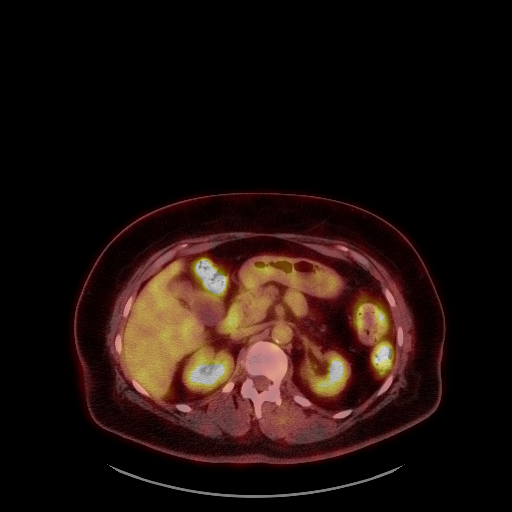
[im 216/216]
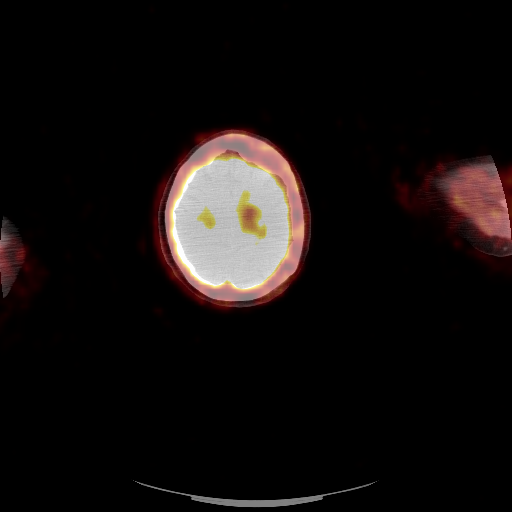

[Series 1086: results mm oncology reading · 1.0mm · 0.50mm/px · 1 of 12 slices shown (1 of 2)]
[im 1/12]
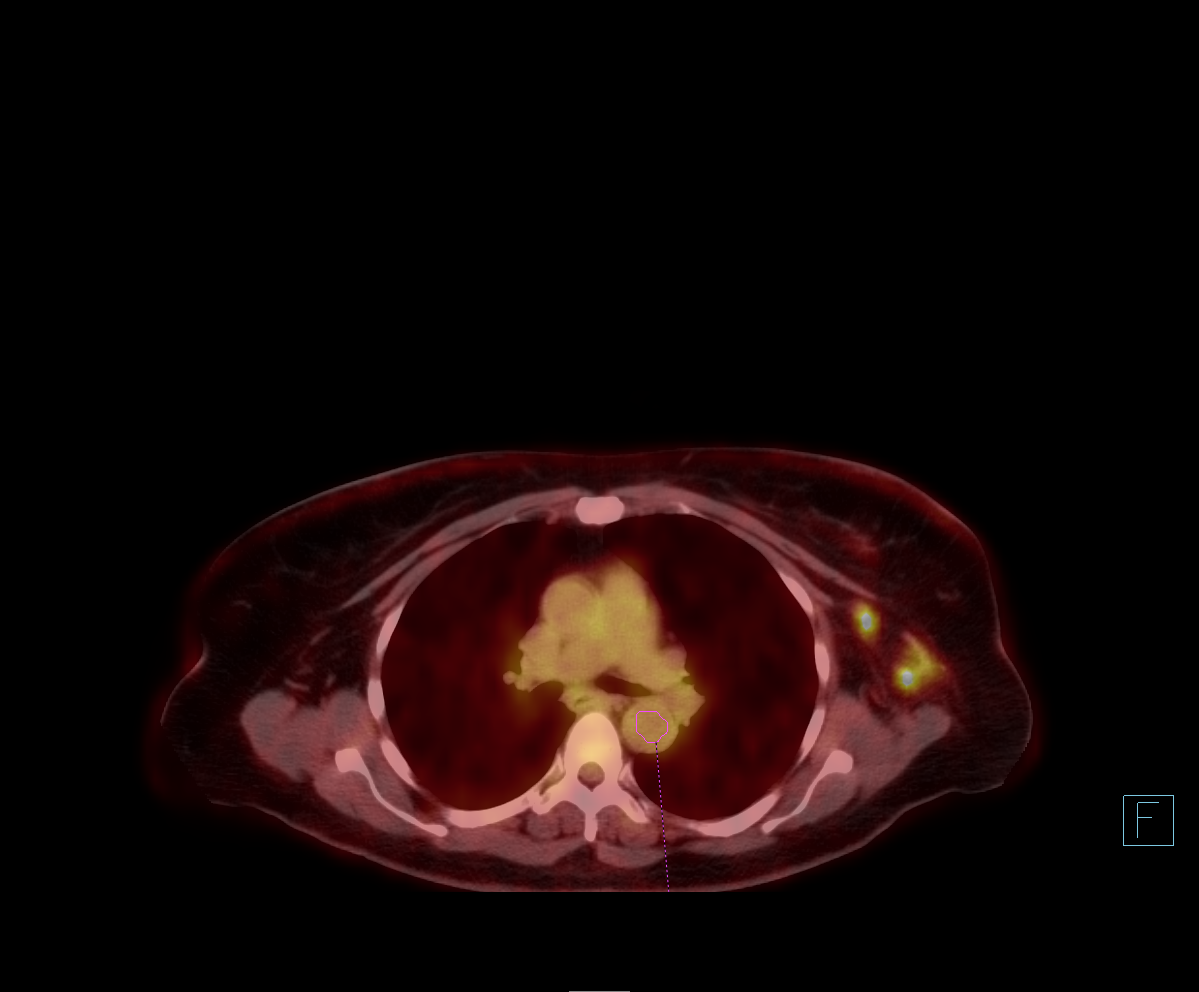

[Series 1253: results mm oncology reading · 5.0mm · 1.06mm/px · 1 of 2 slices shown (2 of 2)]
[im 1/2]
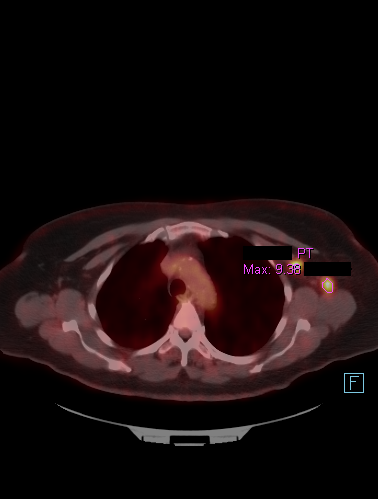

[21 of 25 positions shown; findings below may reference images not displayed]

FINDINGS: Mediastinal blood pool activity: SUV max

Liver activity: SUV max NA

NECK: No hypermetabolic lymph nodes in the neck.

Incidental CT findings: Areas of hypoattenuation of bilateral basal
ganglia likely from prior infarct. Please see MRI of the brain for
further detail also acquired on [DATE] reported separately.

CHEST: Hypermetabolic LEFT breast mass (image 78/4) 3.1 x 2.5 cm
with a maximum SUV of 14.1.

Hypermetabolic LEFT axillary and retropectoral lymph nodes as well
as a LEFT supraclavicular lymph node.

(Image 62/4) along the edge of the LEFT pectoralis musculature are
small lymph nodes, adjacent lymph nodes largest 8 mm. In total
maximum SUV of 12.0.

Biopsy clip in the LEFT axilla adjacent to hypermetabolic lymph node
(image 69/4) 11 mm short axis. This in the adjacent lymph node that
is approximately 1 cm with a maximum SUV of 7.4.

(Image 62/4) LEFT axillary lymph node 12 mm with a maximum SUV of
9.4. At least 4 additional lymph nodes in the LEFT axilla. Tiny LEFT
retropectoral lymph node on image 61/4 beneath the pectoralis minor
with a maximum SUV of 3.2.

High LEFT retropectoral lymph node beneath the pectoralis major
(image 52/3) measuring 6 mm with a maximum SUV of 4.6.

(Image 50/4) LEFT thoracic inlet lymph node approximately 4 mm short
axis with a maximum SUV of 8.8.

Scattered pulmonary nodules show mild FDG uptake.

(Image 81/4) peripheral pulmonary nodule in the RIGHT mid chest
measuring 6 mm in the RIGHT middle lobe maximum SUV of 3.8.

Peripheral pulmonary nodule on image 75 of series 4 approximately 6
mm showing a maximum SUV of

Adjacent small nodule on image [DATE] is 3 mm size.

LEFT upper lobe pulmonary nodule (image 23/a 3 mm.

LEFT lower lobe pulmonary nodule (image 37/8) 3 mm.

Mild bronchial wall thickening to the RIGHT lower lobe with cystic
areas at the peripheral aspect of the RIGHT lower lobe raising the
question of some bronchiectatic change in this area not associated
with significant metabolic activity. Airways are patent though with
mild narrowing of the trachea and mainstem bronchi potentially due
to bronchomalacia.

Incidental CT findings: Calcified atheromatous plaque of the
thoracic aorta. No aneurysmal dilation. Normal heart size. Mitral
annular calcification. Normal caliber of the central pulmonary
vasculature. Patulous esophagus.

ABDOMEN/PELVIS: Mild nodularity of the LEFT adrenal gland. FDG
uptake slightly greater than background liver at 4.2 with uptake in
the liver at 3.6. No adenopathy or other suspicious finding in the
abdomen or in the pelvis.

Incidental CT findings: No acute findings related to liver,
gallbladder, pancreas, spleen, adrenal glands or kidneys.
Nephrolithiasis in the bilateral kidneys largest calculus in the
lower pole the RIGHT kidney 5 mm. Single 3 mm calculus in the LEFT
kidney. No ureteral dilation, perinephric stranding or stranding
about the urinary bladder. Normal appendix. No acute
gastrointestinal findings. Calcified atheromatous plaque of the
abdominal aorta without aneurysmal dilation. Unremarkable appearance
of reproductive structures in the pelvis.

SKELETON: No focal hypermetabolic activity to suggest skeletal
metastasis.

Incidental CT findings: none
IMPRESSION: Signs of LEFT breast cancer with LEFT axillary and retropectoral
adenopathy as well as LEFT supraclavicular adenopathy.

Signs of pulmonary metastatic disease.

Mild asymmetric uptake in the LEFT as compared to the RIGHT adrenal
gland. Equivocal but suspicious, attention on follow-up.

Aortic atherosclerosis.

Cystic changes and potential bronchiectasis in the RIGHT lung base
likely post infectious or inflammatory. Correlate with any
respiratory symptoms. Comparison with prior imaging may be helpful
with attention on follow-up.

Aortic Atherosclerosis ([IZ]-[IZ]).

## 2020-08-28 IMAGING — MR MR HEAD WO/W CM
12 series · 48 of 48 positions shown · IV contrast (gadavist)
Comparison: PET-CT [DATE]. Lumbar MRI [DATE].

CLINICAL DATA: 80-year-old female with metastatic breast cancer.
Right leg weakness. Confusion.

EXAM:
MRI HEAD WITHOUT AND WITH CONTRAST
TECHNIQUE: Multiplanar, multiecho pulse sequences of the brain and surrounding
structures were obtained without and with intravenous contrast.
CONTRAST:  8mL GADAVIST GADOBUTROL 1 MMOL/ML IV SOLN

[Series 5: DWI · axial · 3.0mm · 1.36mm/px · z∈[-63,+76]mm · 6 of 96 slices shown (1 of 2)]
[im 1/96]
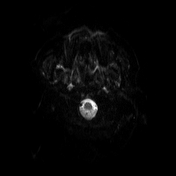
[im 20/96]
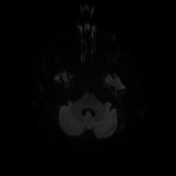
[im 39/96]
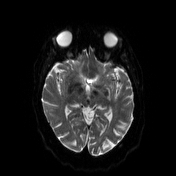
[im 58/96]
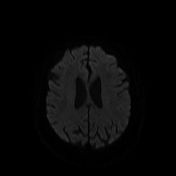
[im 77/96]
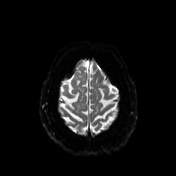
[im 96/96]
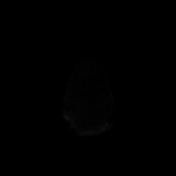

[Series 6: DWI · axial · 3.0mm · 1.36mm/px · z∈[-63,+76]mm · 4 of 48 slices shown (2 of 2)]
[im 1/48]
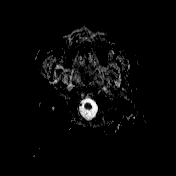
[im 16/48]
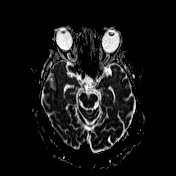
[im 32/48]
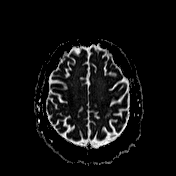
[im 48/48]
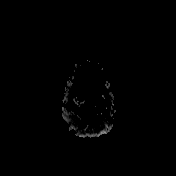

[Series 7: T1 · sagittal · 5.0mm · 0.75mm/px · 2 of 24 slices shown (1 of 2)]
[im 1/24]
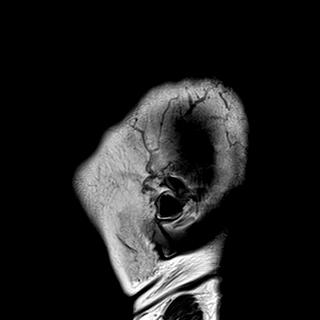
[im 24/24]
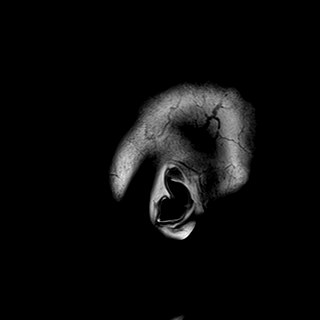

[Series 8: T2 · axial · 5.0mm · 0.62mm/px · z∈[-73,+87]mm · 2 of 26 slices shown]
[im 1/26]
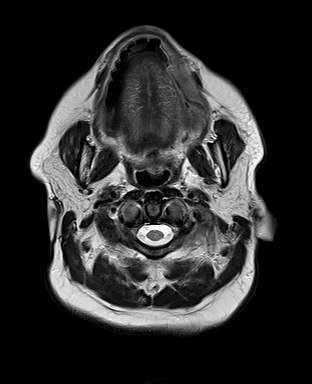
[im 26/26]
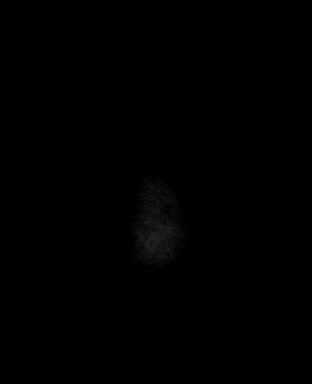

[Series 9: swi_images · axial · 3.0mm · 0.75mm/px · z∈[-98,+112]mm · 5 of 72 slices shown]
[im 1/72]
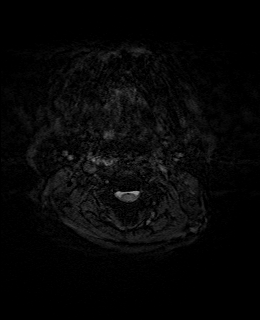
[im 18/72]
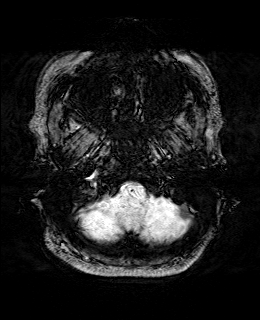
[im 36/72]
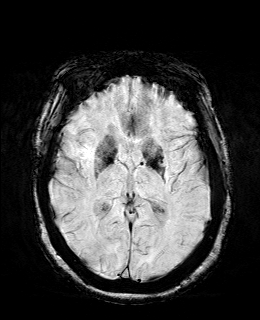
[im 54/72]
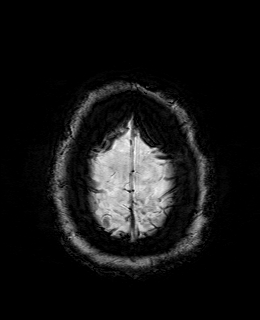
[im 72/72]
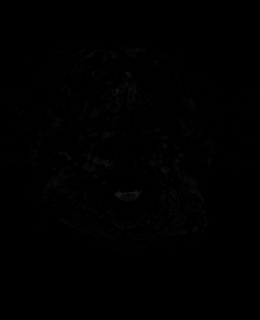

[Series 11: FLAIR · axial · 3.0mm · 0.75mm/px · z∈[-68,+83]mm · 4 of 52 slices shown]
[im 1/52]
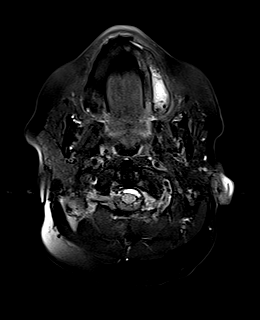
[im 18/52]
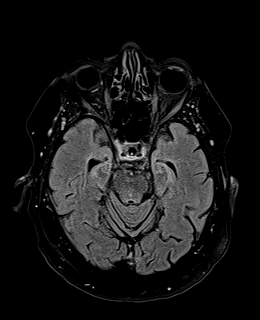
[im 35/52]
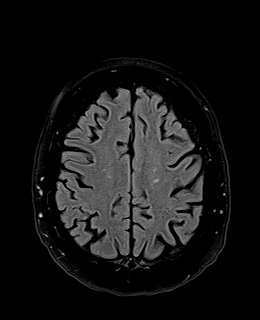
[im 52/52]
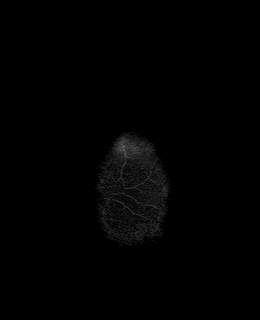

[Series 12: T1 · axial · 1.0mm · 0.94mm/px · z∈[-64,+77]mm · 11 of 144 slices shown (2 of 2)]
[im 1/144]
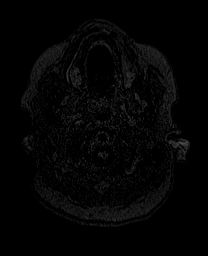
[im 15/144]
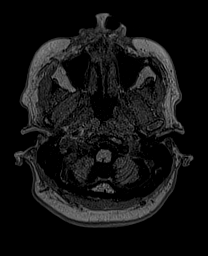
[im 29/144]
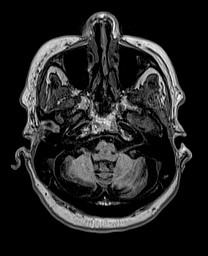
[im 43/144]
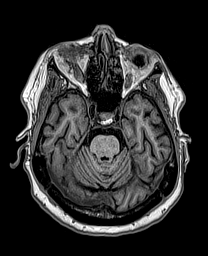
[im 58/144]
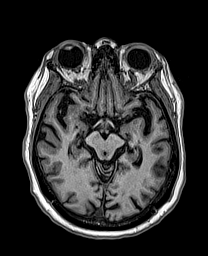
[im 72/144]
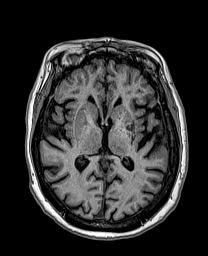
[im 86/144]
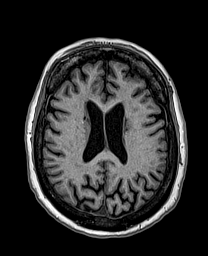
[im 101/144]
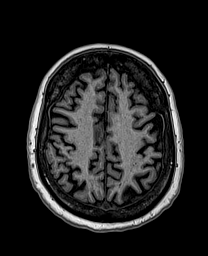
[im 115/144]
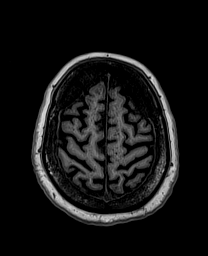
[im 129/144]
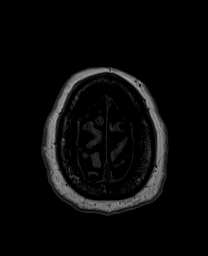
[im 144/144]
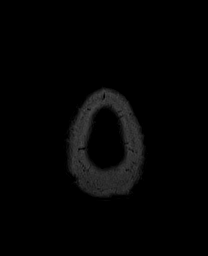

[Series 13: cor dwi_tracew · coronal · 5.0mm · 1.53mm/px · 4 of 48 slices shown]
[im 1/48]
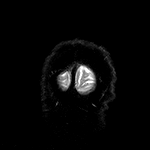
[im 16/48]
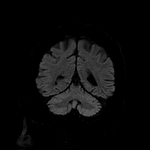
[im 32/48]
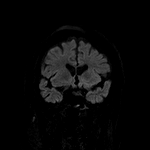
[im 48/48]
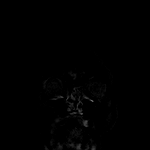

[Series 14: cor dwi_adc · coronal · 5.0mm · 1.53mm/px · 2 of 24 slices shown]
[im 1/24]
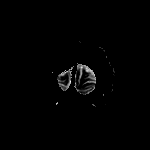
[im 24/24]
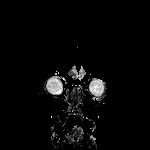

[Series 15: resolve dwi_tracew · axial · 5.0mm · 1.67mm/px · z∈[-72,+81]mm · 4 of 50 slices shown]
[im 1/50]
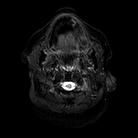
[im 17/50]
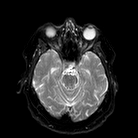
[im 33/50]
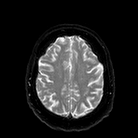
[im 50/50]
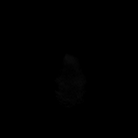

[Series 16: resolve dwi_adc · axial · 5.0mm · 1.67mm/px · z∈[-72,+81]mm · 2 of 25 slices shown]
[im 1/25]
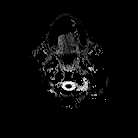
[im 25/25]
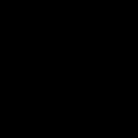

[Series 17: T2 post-contrast · coronal · 5.0mm · 0.57mm/px · 2 of 32 slices shown]
[im 1/32]
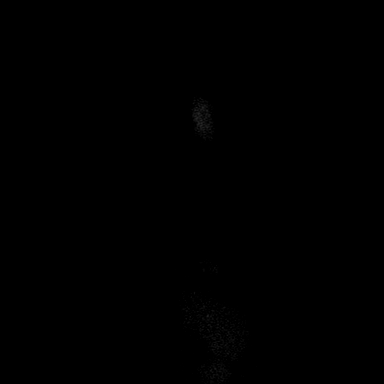
[im 32/32]
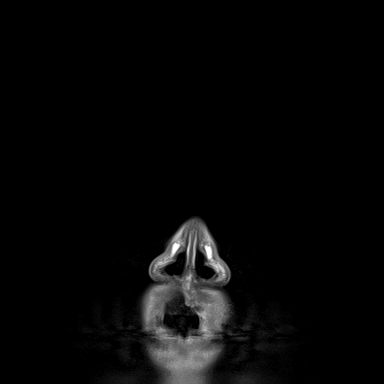

[48 of 48 positions shown; findings below may reference images not displayed]

FINDINGS: Brain: Abnormal trace diffusion in the left lentiform nuclei which
is mostly facilitated on ADC (series 5, image 73 and series 6, image
25). Some involvement of the left caudate also. Associated T2 and
FLAIR hyperintensity plus microhemorrhage (series 9, image 38) but
no mass effect.

Superimposed additional chronic lacunar infarcts in the bilateral
basal ganglia. No other abnormal diffusion. No cortical
encephalomalacia is identified. No definite chronic cerebral blood
products outside of the basal ganglia. Mild for age white matter
signal changes at outside of the deep white matter capsules. No
midline shift, mass effect, evidence of mass lesion,
ventriculomegaly, extra-axial collection or acute intracranial
hemorrhage. Cervicomedullary junction and pituitary are within
normal limits.

Following contrast no abnormal enhancement is identified. No dural
thickening.

Vascular: Major intracranial vascular flow voids are preserved. The
right vertebral artery appears dominant. The major dural venous
sinuses are enhancing and appear to be patent.

Skull and upper cervical spine: Multilevel cervical spine
degeneration with mild spinal stenosis visible at C3-C4 and C4-C5.
Visualized bone marrow signal is within normal limits.

Sinuses/Orbits: Negative.

Other: Trace right mastoid air cell fluid appears inconsequential.
Visible internal auditory structures appear normal. Negative visible
scalp and face.
IMPRESSION: 1. Subacute lacunar infarct of the left basal ganglia with no
malignant hemorrhagic transformation or mass effect. Underlying
chronic bilateral basal ganglia ischemia.
2. No metastatic disease or other acute intracranial abnormality
identified.

## 2020-08-28 IMAGING — MR MR BREAST BILAT WO/W CM
9 of 13 series · 29 of 48 positions shown · IV contrast (gadavist)
Comparison: Recent mammogram, ultrasound, biopsy PET-CT
examinations.

CLINICAL DATA: Recently diagnosed invasive ductal carcinoma in the
30 o'clock position of the left breast, invasive ductal carcinoma
and high-grade ductal carcinoma in situ in the 2 o'clock position of
the left breast and metastatic left axillary lymph node.

LABS:  None obtained on site today.
EXAM:
BILATERAL BREAST MRI WITH AND WITHOUT CONTRAST
TECHNIQUE: Multiplanar, multisequence MR images of both breasts were obtained
prior to and following the intravenous administration of 8 ml of
Gadavist

[Series 2: T2 · axial · 3.0mm · 0.98mm/px · 1 of 65 slices shown]
[im 1/65]
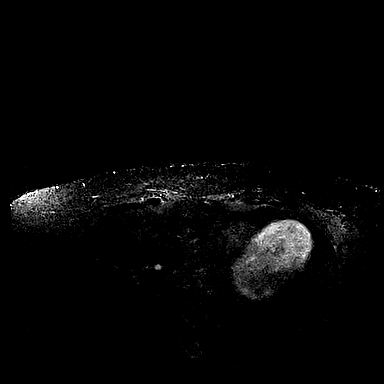

[Series 3: T1 fat-sat · axial · 1.2mm · 0.84mm/px · z∈[-94,+97]mm · 5 of 160 slices shown (1 of 5)]
[im 1/160]
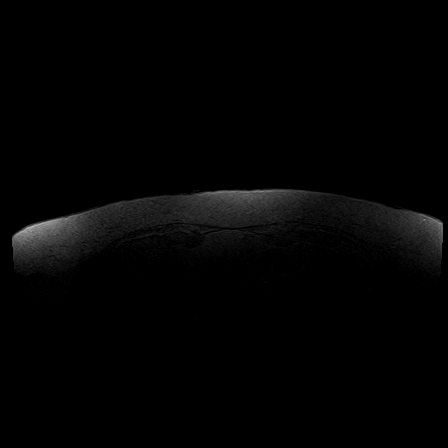
[im 40/160]
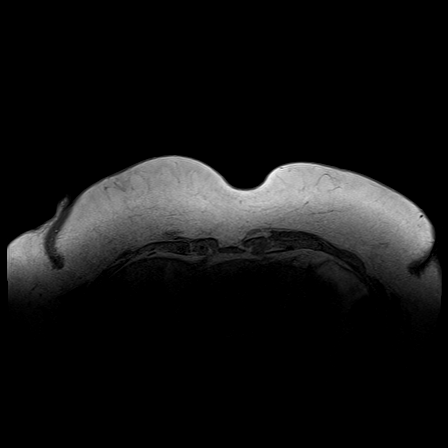
[im 80/160]
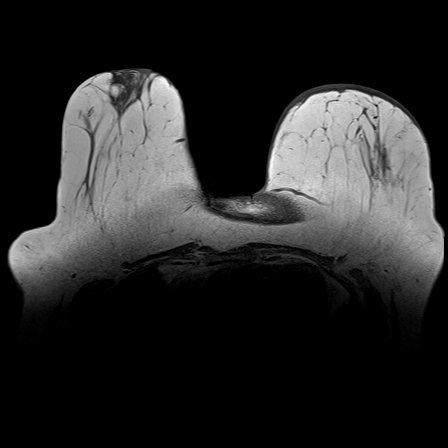
[im 120/160]
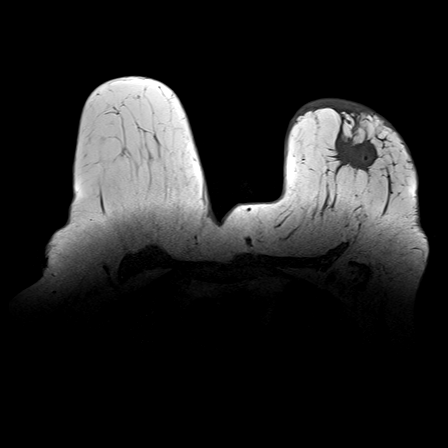
[im 160/160]
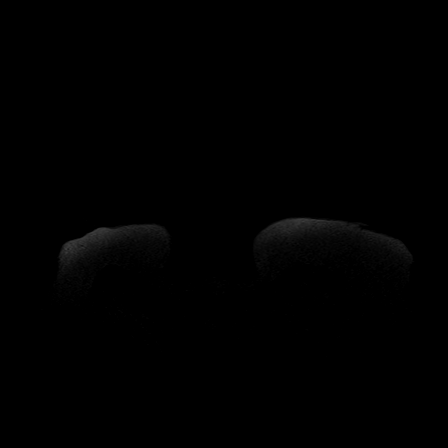

[Series 4: T1 fat-sat · axial · 1.6mm · 0.90mm/px · z∈[-102,+102]mm · 4 of 128 slices shown (2 of 5)]
[im 1/128]
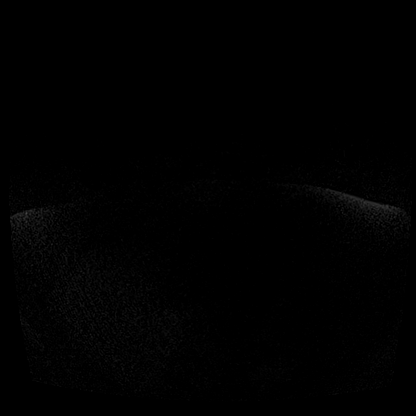
[im 43/128]
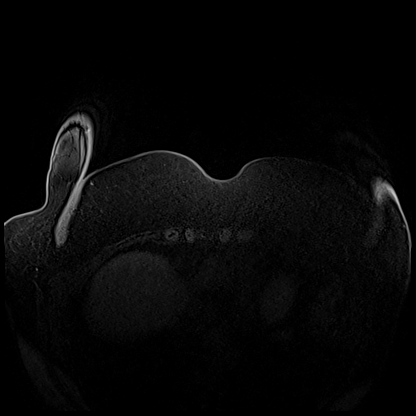
[im 85/128]
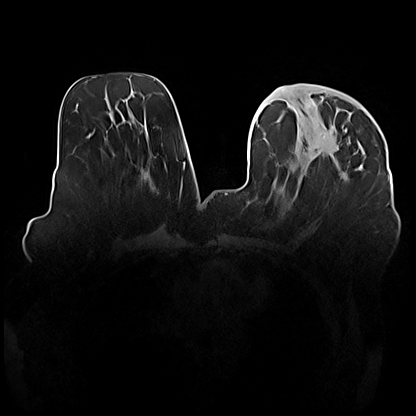
[im 128/128]
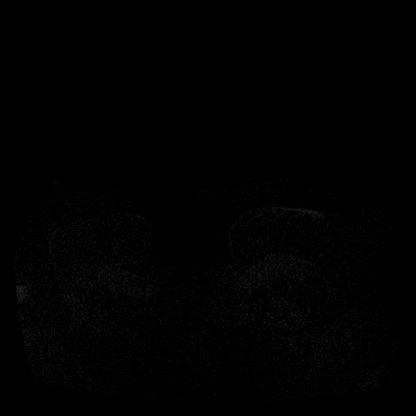

[Series 5: T1 fat-sat · axial · 1.6mm · 0.90mm/px · z∈[-102,+102]mm · 5 of 128 slices shown (3 of 5)]
[im 1/128]
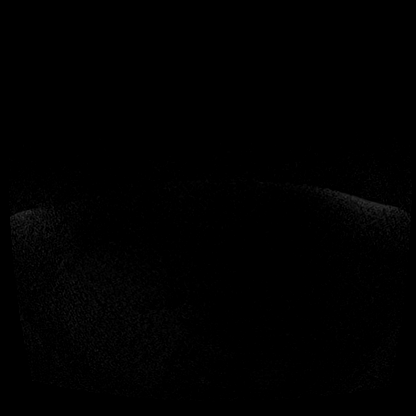
[im 32/128]
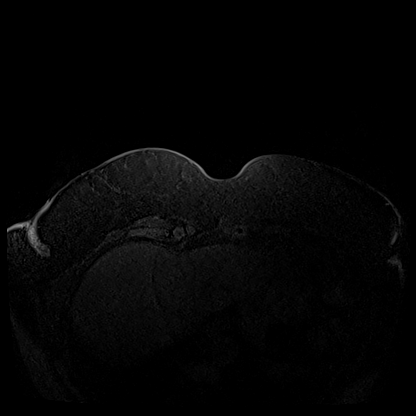
[im 64/128]
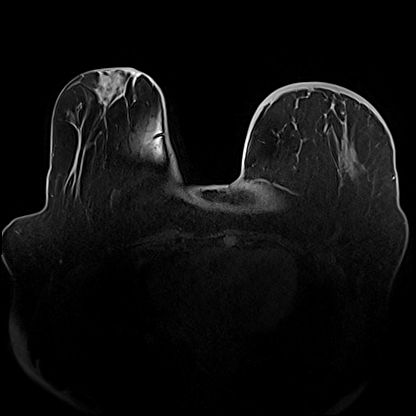
[im 96/128]
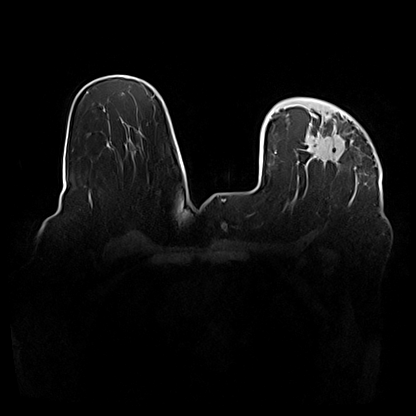
[im 128/128]
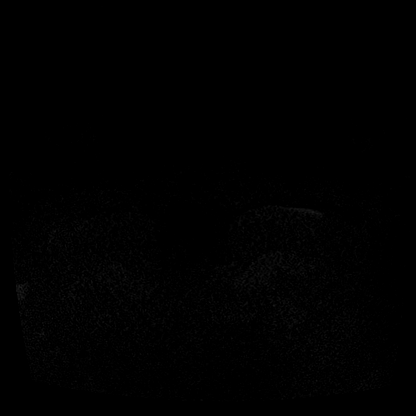

[Series 6: T1 fat-sat · axial · 1.6mm · 0.90mm/px · z∈[-102,+102]mm · 5 of 128 slices shown (4 of 5)]
[im 1/128]
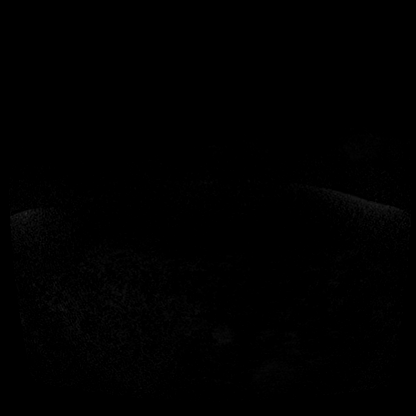
[im 32/128]
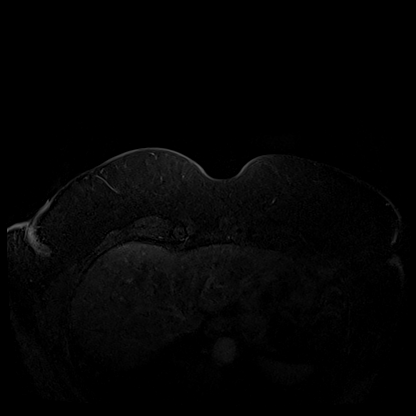
[im 64/128]
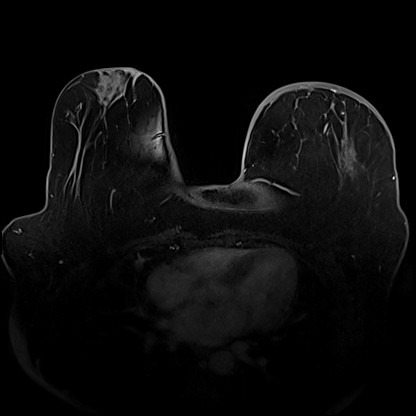
[im 96/128]
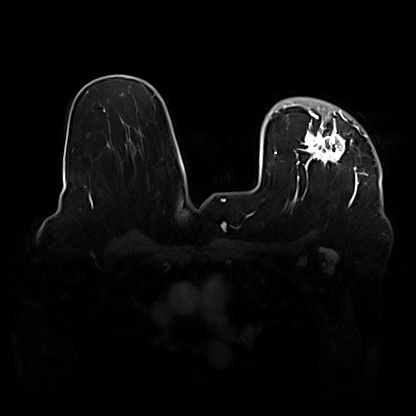
[im 128/128]
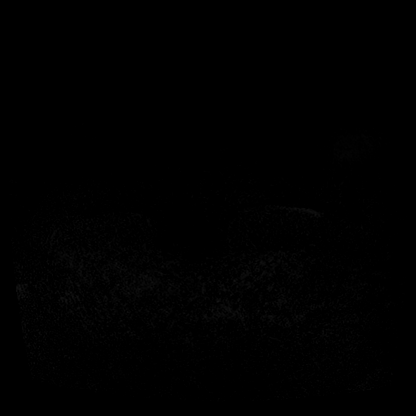

[Series 7: T1 · axial · 1.6mm · 0.90mm/px · z∈[-102,+102]mm · 5 of 128 slices shown (1 of 3)]
[im 1/128]
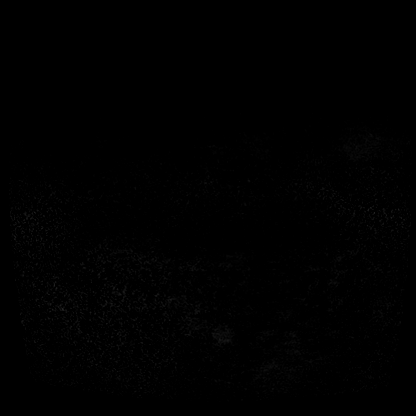
[im 32/128]
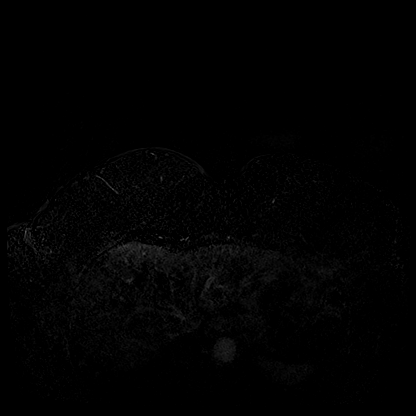
[im 64/128]
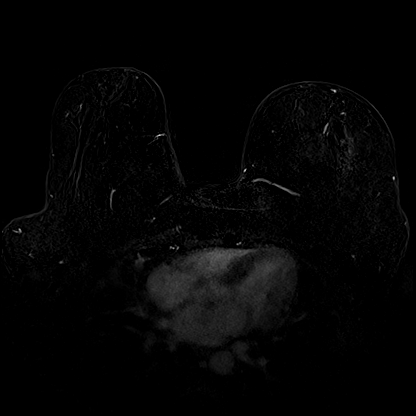
[im 96/128]
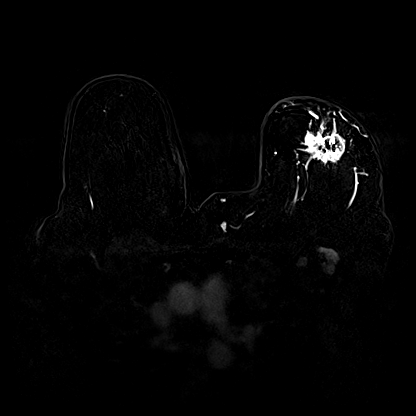
[im 128/128]
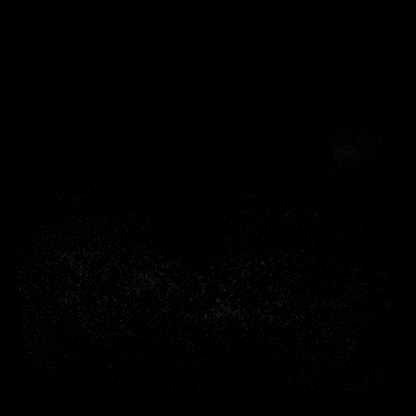

[Series 8: T1 · coronal · 375.0mm · 0.90mm/px · 1 of 3 slices shown (2 of 3)]
[im 1/3]
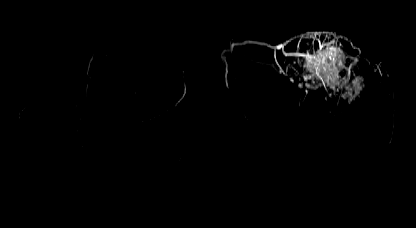

[Series 9: T1 · axial · 204.8mm · 0.90mm/px · 1 of 2 slices shown (3 of 3)]
[im 1/2]
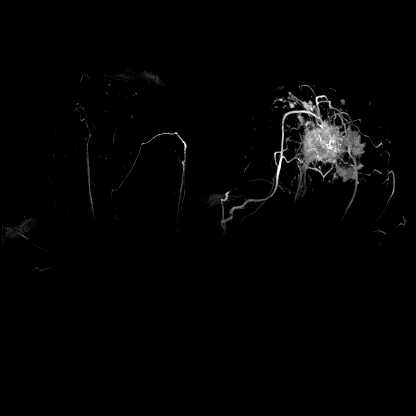

[Series 10: T1 fat-sat · axial · 1.6mm · 0.90mm/px · z∈[-102,-52]mm · 2 of 128 slices shown (5 of 5)]
[im 1/128]
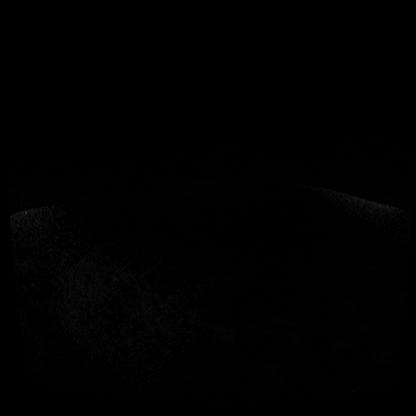
[im 32/128]
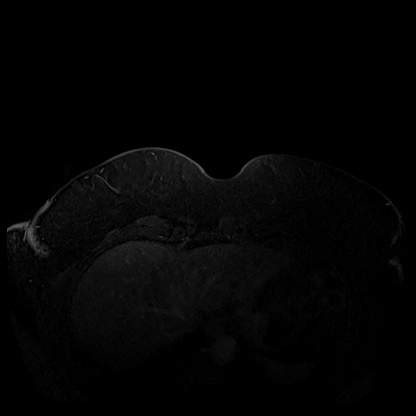

[29 of 48 positions shown; findings below may reference images not displayed]

Three-dimensional MR images were rendered by post-processing of the
original MR data on an independent workstation. The
three-dimensional MR images were interpreted, and findings are
reported in the following complete MRI report for this study. Three
dimensional images were evaluated at the independent interpreting
workstation using the DynaCAD thin client.
FINDINGS: Breast composition: c. Heterogeneous fibroglandular tissue.

Background parenchymal enhancement: Minimal

Right breast: No mass or abnormal enhancement.

Left breast: Multiple irregular, partially confluent masses in the
central, upper outer and lower outer quadrants of the left breast.
These contain 2 biopsy marker clip artifacts. These span an area
measuring 8.1 x 8.1 x 5.1 cm. These have a mixture of enhancement
kinetics, including rapid wash-in/washout

Lymph nodes: At least 7 abnormally enlarged lymph nodes with
abnormal cortical thickening in the left axilla and retropectoral
region.

Ancillary findings:  None.
IMPRESSION: 1. 8.1 x 8.1 x 5.1 cm area of biopsy-proven malignancy in the
central, upper outer and lower outer quadrants of the left breast.
2. Diffuse anterior left breast skin thickening without abnormal
enhancement. This may represent thickening due to lymphedema
associated with lymphatic obstruction.
3. Metastatic level 1 and level 2 left axillary adenopathy.
4. No evidence of malignancy on the right.

RECOMMENDATION:
Treatment plan.

BI-RADS CATEGORY  6: Known biopsy-proven malignancy.

## 2020-08-28 IMAGING — MR MR HEAD WO/W CM
3 series · 48 of 48 positions shown · IV contrast (gadavist)
Comparison: PET-CT [DATE]. Lumbar MRI [DATE].

CLINICAL DATA: 80-year-old female with metastatic breast cancer.
Right leg weakness. Confusion.

EXAM:
MRI HEAD WITHOUT AND WITH CONTRAST
TECHNIQUE: Multiplanar, multiecho pulse sequences of the brain and surrounding
structures were obtained without and with intravenous contrast.
CONTRAST:  8mL GADAVIST GADOBUTROL 1 MMOL/ML IV SOLN

[Series 5: T1 post-contrast · axial · 1.0mm · 0.94mm/px · z∈[-66,+76]mm · 34 of 144 slices shown (1 of 3)]
[im 1/144]
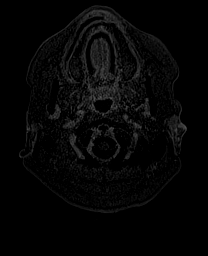
[im 5/144]
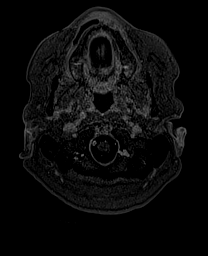
[im 9/144]
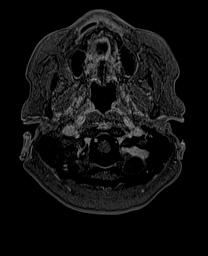
[im 14/144]
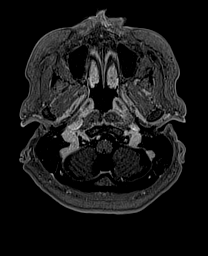
[im 18/144]
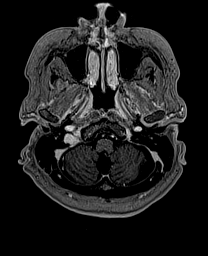
[im 22/144]
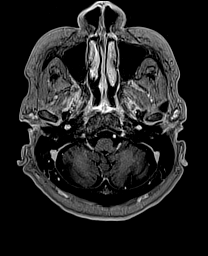
[im 27/144]
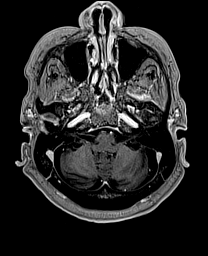
[im 31/144]
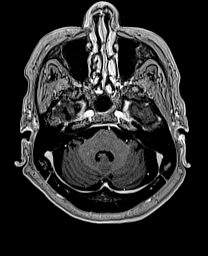
[im 35/144]
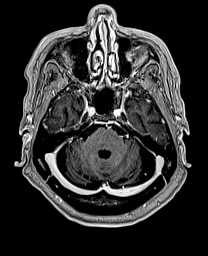
[im 40/144]
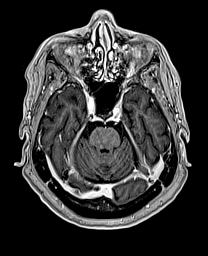
[im 44/144]
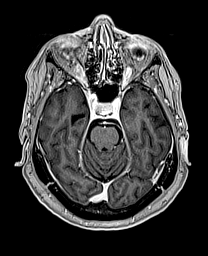
[im 48/144]
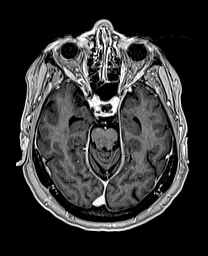
[im 53/144]
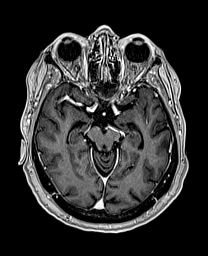
[im 57/144]
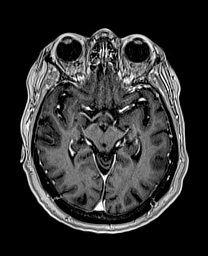
[im 61/144]
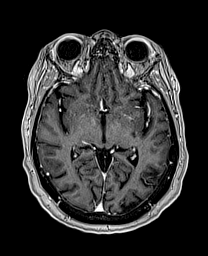
[im 66/144]
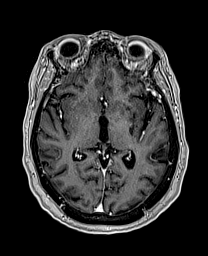
[im 70/144]
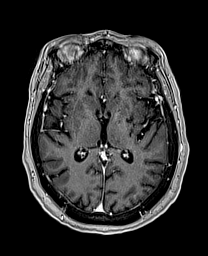
[im 74/144]
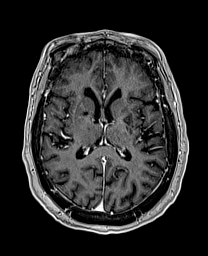
[im 79/144]
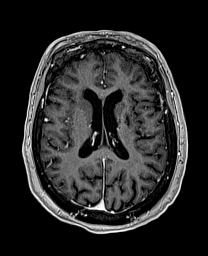
[im 83/144]
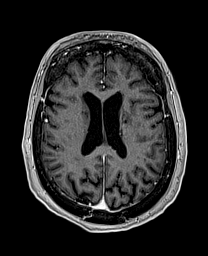
[im 87/144]
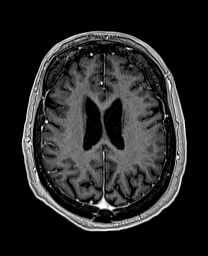
[im 92/144]
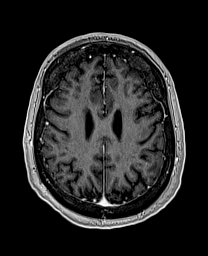
[im 96/144]
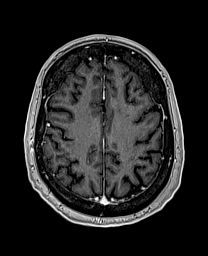
[im 100/144]
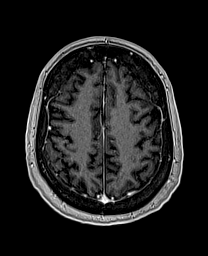
[im 105/144]
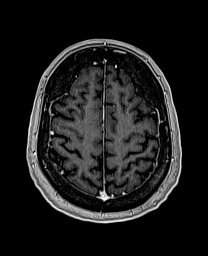
[im 109/144]
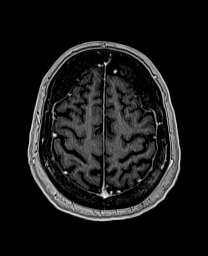
[im 113/144]
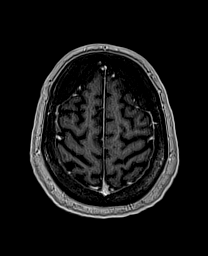
[im 117/144]
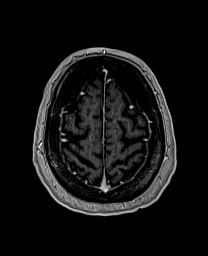
[im 122/144]
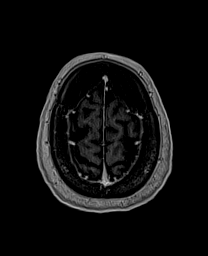
[im 126/144]
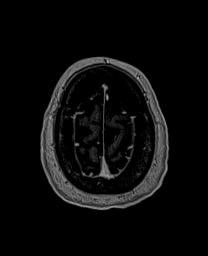
[im 131/144]
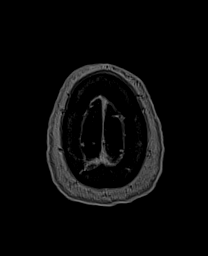
[im 135/144]
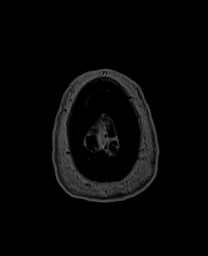
[im 139/144]
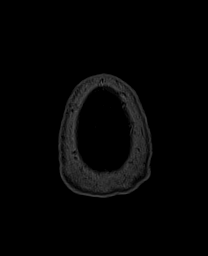
[im 144/144]
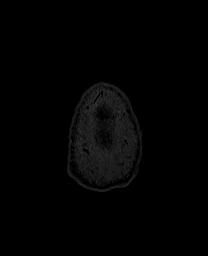

[Series 6: T1 post-contrast · coronal · 5.0mm · 0.43mm/px · 8 of 32 slices shown (2 of 3)]
[im 1/32]
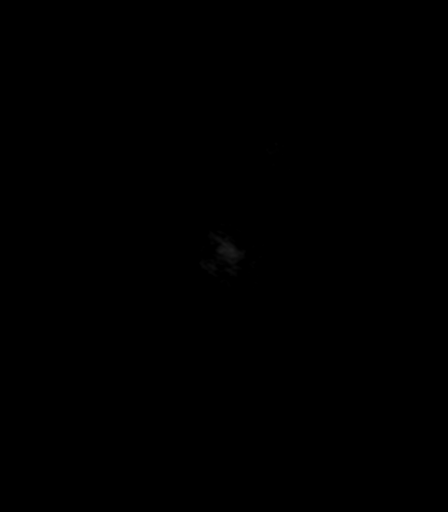
[im 5/32]
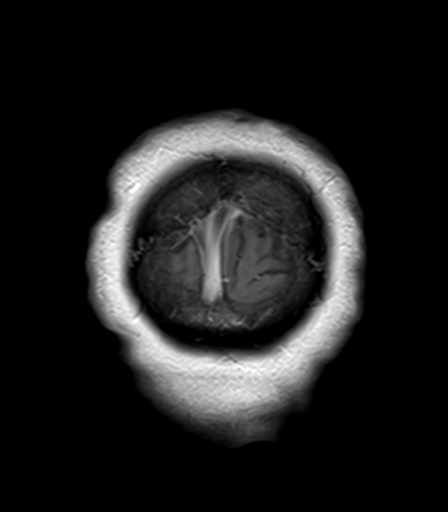
[im 9/32]
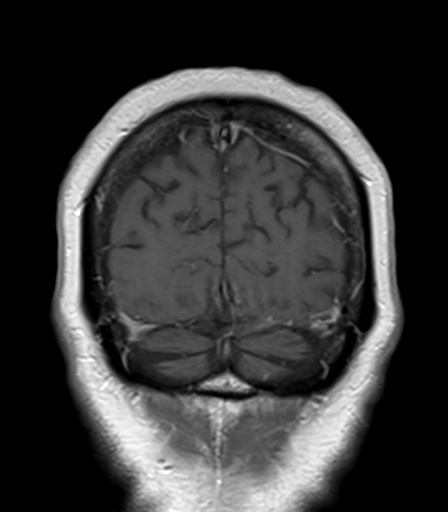
[im 14/32]
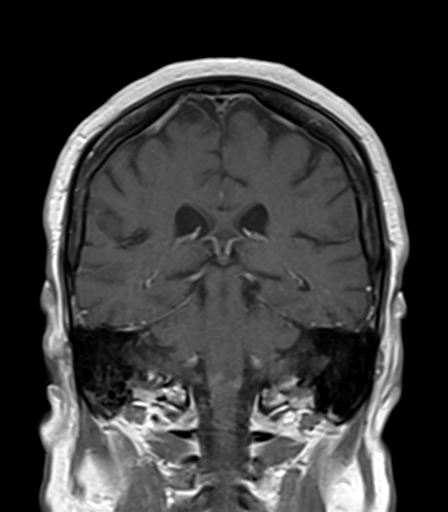
[im 18/32]
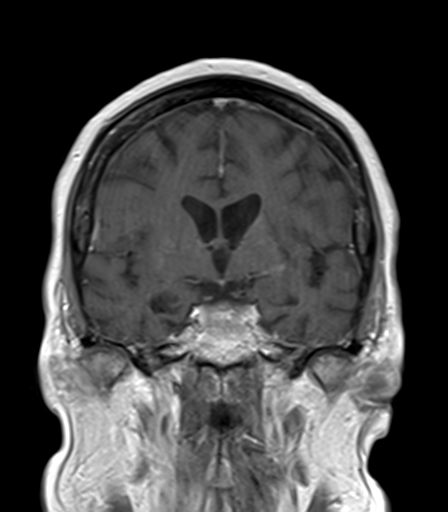
[im 23/32]
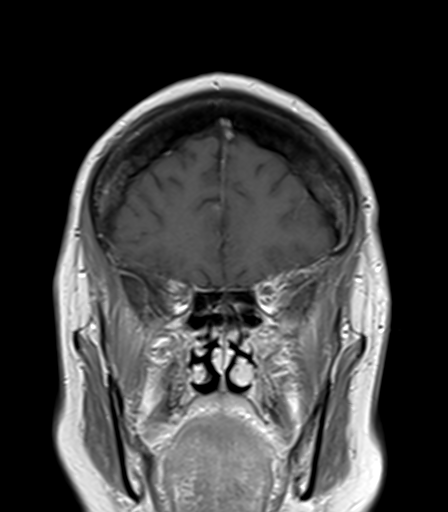
[im 27/32]
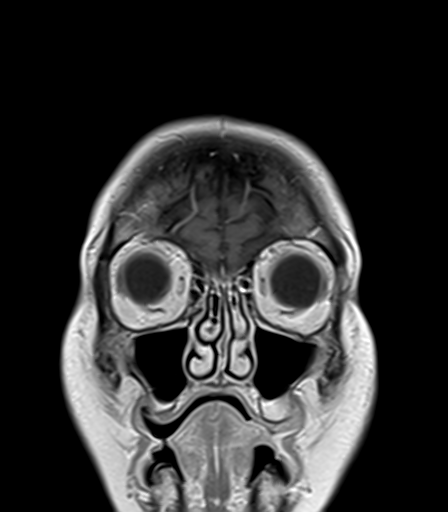
[im 32/32]
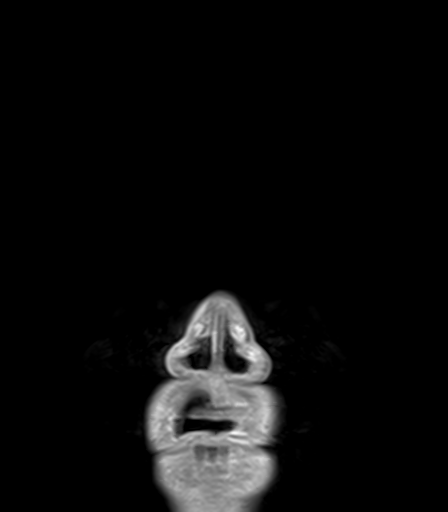

[Series 7: T1 post-contrast · sagittal · 5.0mm · 0.75mm/px · 6 of 24 slices shown (3 of 3)]
[im 1/24]
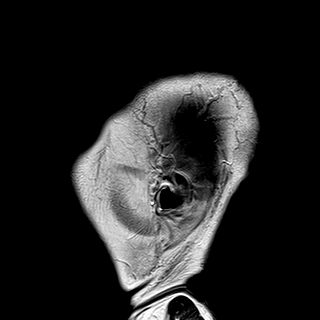
[im 5/24]
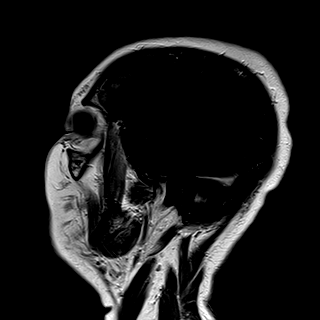
[im 10/24]
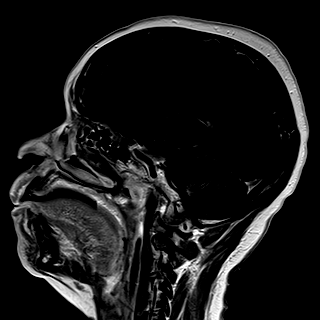
[im 14/24]
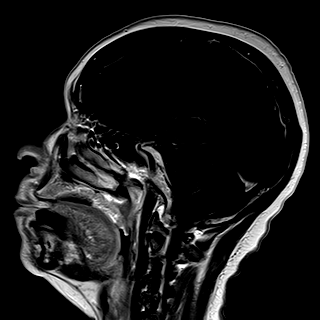
[im 19/24]
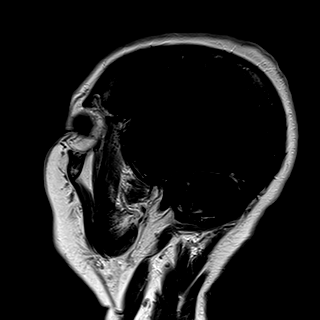
[im 24/24]
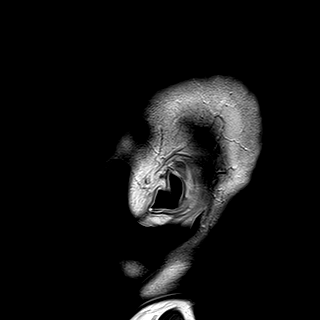

[48 of 48 positions shown; findings below may reference images not displayed]

FINDINGS: Brain: Abnormal trace diffusion in the left lentiform nuclei which
is mostly facilitated on ADC (series 5, image 73 and series 6, image
25). Some involvement of the left caudate also. Associated T2 and
FLAIR hyperintensity plus microhemorrhage (series 9, image 38) but
no mass effect.

Superimposed additional chronic lacunar infarcts in the bilateral
basal ganglia. No other abnormal diffusion. No cortical
encephalomalacia is identified. No definite chronic cerebral blood
products outside of the basal ganglia. Mild for age white matter
signal changes at outside of the deep white matter capsules. No
midline shift, mass effect, evidence of mass lesion,
ventriculomegaly, extra-axial collection or acute intracranial
hemorrhage. Cervicomedullary junction and pituitary are within
normal limits.

Following contrast no abnormal enhancement is identified. No dural
thickening.

Vascular: Major intracranial vascular flow voids are preserved. The
right vertebral artery appears dominant. The major dural venous
sinuses are enhancing and appear to be patent.

Skull and upper cervical spine: Multilevel cervical spine
degeneration with mild spinal stenosis visible at C3-C4 and C4-C5.
Visualized bone marrow signal is within normal limits.

Sinuses/Orbits: Negative.

Other: Trace right mastoid air cell fluid appears inconsequential.
Visible internal auditory structures appear normal. Negative visible
scalp and face.
IMPRESSION: 1. Subacute lacunar infarct of the left basal ganglia with no
malignant hemorrhagic transformation or mass effect. Underlying
chronic bilateral basal ganglia ischemia.
2. No metastatic disease or other acute intracranial abnormality
identified.

## 2020-08-28 MED ORDER — FLUDEOXYGLUCOSE F - 18 (FDG) INJECTION
10.0000 | Freq: Once | INTRAVENOUS | Status: AC | PRN
  Administered 2020-08-28: 10 via INTRAVENOUS

## 2020-08-28 MED ORDER — GADOBUTROL 1 MMOL/ML IV SOLN
8.0000 mL | Freq: Once | INTRAVENOUS | Status: AC | PRN
  Administered 2020-08-28: 8 mL via INTRAVENOUS

## 2020-08-30 NOTE — Progress Notes (Addendum)
Christy [name]   [phone] [phone]   Clinic Follow up Note   Patient Care Team: Nolene Ebbs, MD as PCP - General (Internal Medicine) Mauro Kaufmann, RN as Oncology Nurse Navigator Rockwell Germany, RN as Oncology Nurse Navigator 08/31/2020  CHIEF COMPLAINT: F/up breast cancer staging   SUMMARY OF ONCOLOGIC HISTORY: Oncology History  Malignant neoplasm of upper-outer quadrant of female breast ([place])  07/24/2020 Breast US   On physical exam, a very firm mass is identified at the 1:30 position of the LEFT breast 5 cm from the nipple.   Targeted ultrasound of the LEFT breast/axilla I   A 3.9 x 3.6 x 4.3 cm irregular hypoechoic mass at the 1:30 position 5 cm from the nipple. A 2.3 x 1.8 x 2.7 cm irregular mass at the 2 o'clock position 8 cm from the nipple. The 2 masses above encompass an area measuring at least 6.1 cm. Anterior LEFT breast skin thickening. Three abnormal LEFT axillary lymph nodes with cortical thickening.   IMPRESSION: 1. Highly suspicious 4.3 cm mass at the 1:30 position of the LEFT breast and highly suspicious 2.7 cm mass at the 2 o'clock position of the LEFT breast, with the 2 masses encompassing an area measuring at least 6.1 cm. Tissue sampling of both of these masses recommended. 2. 3 abnormal LEFT axillary lymph nodes with cortical thickening, tissue sampling of 1 of these lymph nodes recommended. 3. Anterior LEFT breast skin thickening nonspecific but may reflect dermal involvement. 4. No mammographic evidence of RIGHT breast malignancy.   07/31/2020 Initial Biopsy   Diagnosis 1. Breast, left, needle core biopsy, 1 :30 pm, 5cmfn - INVASIVE DUCTAL CARCINOMA, SEE COMMENT. 2. Breast, left, needle core biopsy, 2 o'clock, 8cmfn - INVASIVE DUCTAL CARCINOMA, SEE COMMENT. - DUCTAL CARCINOMA IN SITU. 3. Lymph node, needle/core biopsy, left axillary - METASTATIC CARCINOMA IN A LYMPH NODE. Microscopic Comment 1. and  3. The carcinoma in both parts has a similar morphology and appears grade 2-3. The DCIS in part 2 is high-grade with necrosis. The carcinoma measures 15 mm (part 1) and 14 mm (part 2) in greatest linear extent  2. PROGNOSTIC INDICATORS Results: IMMUNOHISTOCHEMICAL AND MORPHOMETRIC ANALYSIS PERFORMED MANUALLY The tumor cells are POSITIVE for Her2 (3+). Estrogen Receptor: 0%, NEGATIVE Progesterone Receptor: 0%, NEGATIVE Proliferation Marker Ki67: 20%    07/31/2020 Cancer Staging   Staging form: Breast, AJCC 8th Edition - Clinical stage from 07/31/2020: Stage IV (cT2, cN3, cM1, G2, ER-, PR-, HER2+) - Signed by Alla Feeling, NP on 08/31/2020  Stage prefix: Initial diagnosis  Nuclear grade: G2  Histologic grading system: 3 grade system    08/13/2020 Initial Diagnosis   Malignant neoplasm of upper-outer quadrant of female breast ([place])   08/28/2020 PET scan   IMPRESSION: Signs of LEFT breast cancer with LEFT axillary and retropectoral adenopathy as well as LEFT supraclavicular adenopathy.   Signs of pulmonary metastatic disease.   Mild asymmetric uptake in the LEFT as compared to the RIGHT adrenal gland. Equivocal but suspicious, attention on follow-up.   Aortic atherosclerosis.   Cystic changes and potential bronchiectasis in the RIGHT lung base likely post infectious or inflammatory. Correlate with any respiratory symptoms. Comparison with prior imaging may be helpful with attention on follow-up.   Aortic Atherosclerosis (ICD10-I70.0).     08/28/2020 Imaging   BRAIN MRI IMPRESSION: 1. Subacute lacunar infarct of the left basal ganglia with no malignant hemorrhagic transformation or mass effect. Underlying chronic bilateral basal ganglia ischemia. 2. No  metastatic disease or other acute intracranial abnormality identified.     CURRENT THERAPY: PENDING   INTERVAL HISTORY: Christy [name] returns for follow up as scheduled. Since initial visit she underwent breast MRI, brain  MRI, and PET scan. She is doing OK. Periodic left breast pains are stable. Denies new pain. She is considering getting right knee injection. Gets full quickly, no n/v. Bowels moving. She has had an intermittent dry cough since exposure in 9/11, never had previous chest imaging or respiratory infections. Denies fever, chills, cough, chest pain, dyspnea.    MEDICAL HISTORY:  Past Medical History:  Diagnosis Date   Diabetes mellitus without complication ([place])    Hyperlipidemia    Hypertension     SURGICAL HISTORY: Past Surgical History:  Procedure Laterality Date   FOOT FRACTURE SURGERY Right     I have reviewed the social history and family history with the patient and they are unchanged from previous note.  ALLERGIES:  has No Known Allergies.  MEDICATIONS:  Current Outpatient Medications  Medication Sig Dispense Refill   amLODipine (NORVASC) 5 MG tablet      clotrimazole (CLOTRIMAZOLE AF) 1 % cream Apply 1 application topically 2 (two) times daily. 30 g 0   metFORMIN (GLUCOPHAGE) 500 MG tablet Take 1 tablet (500 mg total) by mouth 2 (two) times daily with a meal. 60 tablet 1   olmesartan (BENICAR) 40 MG tablet      cetirizine (ZYRTEC ALLERGY) 10 MG tablet Take 1 tablet (10 mg total) by mouth daily. 30 tablet 0   fluticasone (FLONASE) 50 MCG/ACT nasal spray Place 1 spray into both nostrils daily. 1 g 0   No current facility-administered medications for this visit.    PHYSICAL EXAMINATION: ECOG PERFORMANCE STATUS: 1 - Symptomatic but completely ambulatory  Vitals:   08/31/20 1006  BP: (!) 193/91  Pulse: 63  Resp: 17  Temp: (!) 97.4 F (36.3 C)  SpO2: 100%   Filed Weights   08/31/20 1006  Weight: 188 lb 9.6 oz (85.5 kg)    GENERAL:alert, no distress and comfortable SKIN: no rash  EYES:sclera clear LYMPH:  bulky left axillary adenopathy LUNGS: clear to auscultation with normal breathing effort HEART: regular rate & rhythm, no lower extremity edema ABDOMEN:abdomen  soft, non-tender and normal bowel sounds NEURO: alert & oriented x 3 with fluent speech, no focal motor/sensory deficits Breast exam: 4 x5 cm palpable mass in the central left breast. No peau d'orange, erythema, or nipple discharge   LABORATORY DATA:  I have reviewed the data as listed CBC Latest Ref Rng & Units 08/20/2020 06/25/2019  WBC 4.0 - 10.5 K/uL 4.6 6.0  Hemoglobin 12.0 - 15.0 g/dL 11.1(L) 12.2  Hematocrit 36.0 - 46.0 % 33.5(L) 37.5  Platelets 150 - 400 K/uL 267 285     CMP Latest Ref Rng & Units 08/20/2020 06/25/2019  Glucose 70 - 99 mg/dL 101(H) 104(H)  BUN 8 - 23 mg/dL 18 14  Creatinine 0.44 - 1.00 mg/dL 1.71(H) 1.47(H)  Sodium 135 - 145 mmol/L 142 143  Potassium 3.5 - 5.1 mmol/L 4.1 4.3  Chloride 98 - 111 mmol/L 109 110  CO2 22 - 32 mmol/L 24 25  Calcium 8.9 - 10.3 mg/dL 10.2 10.1  Total Protein 6.5 - 8.1 g/dL 7.0 7.0  Total Bilirubin 0.3 - 1.2 mg/dL 0.4 0.4  Alkaline Phos 38 - 126 U/L 63 57  AST 15 - 41 U/L 10(L) 17  ALT 0 - 44 U/L <6 14  RADIOGRAPHIC STUDIES: I have personally reviewed the radiological images as listed and agreed with the findings in the report. ECHOCARDIOGRAM COMPLETE  Result Date: 08/31/2020    ECHOCARDIOGRAM REPORT   Patient Name:   Christy [name] Date of Exam: 08/31/2020 Medical Rec #:  [id number]         Height:       64.0 in Accession #:    [id number]        Weight:       194.7 lb Date of Birth:  02/07/1941          BSA:          1.934 m Patient Age:    80 years          BP:           181/69 mmHg Patient Gender: F                 HR:           65 bpm. Exam Location:  Outpatient Procedure: 2D Echo, Cardiac Doppler, Color Doppler and Strain Analysis Indications:    Chemo evaluation  History:        Patient has no prior history of Echocardiogram examinations.                 Risk Factors:Hypertension, Diabetes and Obesity. Breast cancer.  Sonographer:    Dustin Flock Referring Phys: [id number] Alla Feeling  Sonographer Comments: Patient is  morbidly obese. IMPRESSIONS  1. GLS appears to be inaccurate.  2. Left ventricular ejection fraction, by estimation, is 55 to 60%. The left ventricle has normal function. The left ventricle has no regional wall motion abnormalities. There is mild left ventricular hypertrophy. Left ventricular diastolic parameters are consistent with Grade I diastolic dysfunction (impaired relaxation).  3. Right ventricular systolic function is normal. The right ventricular size is normal.  4. The mitral valve is normal in structure. Trivial mitral valve regurgitation. No evidence of mitral stenosis.  5. The aortic valve is normal in structure. Aortic valve regurgitation is not visualized. No aortic stenosis is present.  6. The inferior vena cava is normal in size with greater than 50% respiratory variability, suggesting right atrial pressure of 3 mmHg. FINDINGS  Left Ventricle: Left ventricular ejection fraction, by estimation, is 55 to 60%. The left ventricle has normal function. The left ventricle has no regional wall motion abnormalities. The left ventricular internal cavity size was normal in size. There is  mild left ventricular hypertrophy. Left ventricular diastolic parameters are consistent with Grade I diastolic dysfunction (impaired relaxation). Right Ventricle: The right ventricular size is normal.Right ventricular systolic function is normal. Left Atrium: Left atrial size was normal in size. Right Atrium: Right atrial size was normal in size. Pericardium: There is no evidence of pericardial effusion. Mitral Valve: The mitral valve is normal in structure. Mild mitral annular calcification. Trivial mitral valve regurgitation. No evidence of mitral valve stenosis. Tricuspid Valve: The tricuspid valve is normal in structure. Tricuspid valve regurgitation is trivial. No evidence of tricuspid stenosis. Aortic Valve: The aortic valve is normal in structure. Aortic valve regurgitation is not visualized. No aortic stenosis is  present. Pulmonic Valve: The pulmonic valve was not well visualized. Pulmonic valve regurgitation is not visualized. No evidence of pulmonic stenosis. Aorta: The aortic root is normal in size and structure. Venous: The inferior vena cava is normal in size with greater than 50% respiratory variability, suggesting right atrial pressure of 3 mmHg. IAS/Shunts: No atrial  level shunt detected by color flow Doppler. Additional Comments: GLS appears to be inaccurate.  LEFT VENTRICLE PLAX 2D LVIDd:         4.30 cm  Diastology LVIDs:         2.90 cm  LV e' medial:    8.05 cm/s LV PW:         1.30 cm  LV E/e' medial:  7.4 LV IVS:        1.20 cm  LV e' lateral:   7.51 cm/s LVOT diam:     2.00 cm  LV E/e' lateral: 7.9 LV SV:         52 LV SV Index:   27 LVOT Area:     3.14 cm  RIGHT VENTRICLE RV Basal diam:  2.40 cm RV S prime:     13.40 cm/s TAPSE (M-mode): 2.1 cm LEFT ATRIUM             Index       RIGHT ATRIUM          Index LA diam:        3.30 cm 1.71 cm/m  RA Area:     9.81 cm LA Vol (A2C):   18.7 ml 9.67 ml/m  RA Volume:   19.70 ml 10.19 ml/m LA Vol (A4C):   25.9 ml 13.39 ml/m LA Biplane Vol: 22.6 ml 11.68 ml/m  AORTIC VALVE LVOT Vmax:   88.70 cm/s LVOT Vmean:  60.300 cm/s LVOT VTI:    0.165 m  AORTA Ao Root diam: 3.00 cm MITRAL VALVE MV Area (PHT): 2.67 cm    SHUNTS MV Decel Time: 284 msec    Systemic VTI:  0.16 m MV E velocity: 59.20 cm/s  Systemic Diam: 2.00 cm MV A velocity: 94.60 cm/s MV E/A ratio:  0.63 Kirk Ruths MD Electronically signed by Kirk Ruths MD Signature Date/Time: 08/31/2020/10:01:51 AM    Final      ASSESSMENT & PLAN: 80 year old postmenopausal female   1.Malignant neoplasm of overlapping sites in the central and upper out quadrant left breast, invasive ductal carcinoma, cT2N3M1 stage IV, grade 2-3, ER/PR negative HER2 positive, MIB-1 of 20% - with nodal and pulmonary metastases  -She initially self palpated a left breast mass in 06/2020, possibly earlier.  Work-up showed 2 masses in  the left upper outer breast and 3 abnormal lymph nodes.  Biopsy of the 2 masses and 1 lymph node all showed invasive ductal carcinoma, grade 2-3, ER/PR negative and HER2 positive. -We reviewed her staging work-up, breast MRI is pending. Brain MRI shows subacute lacunar infarct but otherwise negative for metastatic disease. -We reviewed her PET scan which shows the hypermetabolic known left breast malignancy with hypermetabolic left axillary, retropectoral, and supraclavicular adenopathy.  Additionally there are several small 3-6 mm pulmonary nodules with mild hypermetabolic activity, concerning for pulmonary metastasis.  Dr. Burr Medico feels this is consistent with metastatic disease and is not recommending a biopsy to confirm at this point. -If this is metastatic disease, she is no longer a candidate for upfront surgery, and we are recommending first-line systemic Kadcyla given once every 3 weeks.  Potential benefit, side effects, symptom management, and the palliative goal of treatment were again reviewed.  She agrees to proceed. -I reviewed her baseline labs which shows CKD and mild anemia, CA 27.29 is normal. Baseline echo EF 55-60% -We will asked Dr. Marlou Starks to place a Port-A-Cath, okay to proceed with cycle 1 peripherally if needed. -Due to her ER/PR negative disease, she would  not benefit from antiestrogen therapy. -plan to start kadcyla in the next week or so, we will see her back for toxicity check 1 week after first treatment.  -Patient seen with Dr. Burr Medico   2.  Genetics -Her sister had metastatic breast cancer in her 6s, and that sisters daughter possibly have breast cancer. -Due to family history and ER/PR negative disease, patient qualifies for genetics.  We will discuss at next visit -Patient's daughter understands her breast cancer risk and the importance of annual mammography.    3.Spinal stenosis -Reportedly diagnosed in [place], [place].  Patient has chronic back pain and left leg weakness at  baseline -Recently she developed right leg weakness and frequent fall -PET is negative for spinal mets and brain MRI shows subacute lacunar infarct without metastatic disease    4. HTN, DM -on metformin, amlodipine, and olmesartan -f/up PCP Dr. Jeanie Cooks -Denies baseline neuropathy  5.  Subacute stroke -Her brain MRI showed subacute lacunar infarct of the left basal ganglia, she has no clinical symptoms of stroke. -We will refer her to neurology.  Due to her underlying metastatic breast cancer, I do not think she needs extensive stroke work-up. -We will continue monitoring, she is at risk for recurrent stroke.    PLAN: -Brain MRI, PET, labs, and echo reviewed  -PAC placement with Dr. Marlou Starks, or IR if his schedule does not allow  -Begin first line kadcyla in next 1-2 weeks, ok to do first cycle peripherally if needed -toxicity check 1 week after first cycle   All questions were answered. The patient knows to call the clinic with any problems, questions or concerns. No barriers to learning were detected.     Alla Feeling, NP 08/31/20    Addendum  I have seen the patient, examined her. I agree with the assessment and and plan and have edited the notes.   I personally reviewed her staging PET scan, and discussed this finding with patient and her daughter in detail.  Unfortunately she has multiple small lung nodules, which are mildly hypermetabolic, highly suspicious for metastatic disease.  We discussed option of treatment.  Due to her advanced age, dementia, and stroke, she would not be a candidate for cytotoxic chemotherapy.  I recommend systemic treatment with Kadcyla every 3 weeks, I think she will tolerate it well.  Potential benefit and side effects discussed with patient and her daughter, she agrees to proceed.  We also discussed alternative therapy, such as Enhertu, Neratinib etc. due to her advanced age, I think that she probably will tolerate the Kadcyla better. Will see her a week  after her first treatment, for toxicity checkup. Echo reviewed, adequate for treatment, she will have repeated echo every 3 months when she is on HER2 antibody. Will have Dr. Marlou Starks to put a port in in near future.  All questions were answered.  Truitt Merle  08/31/2020

## 2020-08-31 ENCOUNTER — Other Ambulatory Visit: Payer: Self-pay

## 2020-08-31 ENCOUNTER — Encounter: Payer: Self-pay | Admitting: Nurse Practitioner

## 2020-08-31 ENCOUNTER — Inpatient Hospital Stay (HOSPITAL_BASED_OUTPATIENT_CLINIC_OR_DEPARTMENT_OTHER): Payer: Medicare Other | Admitting: Nurse Practitioner

## 2020-08-31 ENCOUNTER — Other Ambulatory Visit: Payer: Self-pay | Admitting: Hematology

## 2020-08-31 ENCOUNTER — Ambulatory Visit (HOSPITAL_COMMUNITY)
Admission: RE | Admit: 2020-08-31 | Discharge: 2020-08-31 | Disposition: A | Discharge status: Home / Self Care | Payer: Medicare Other | Source: Ambulatory Visit | Attending: Nurse Practitioner | Admitting: Nurse Practitioner

## 2020-08-31 VITALS — BP 193/91 | HR 63 | Temp 97.4°F | Resp 17 | Wt 188.6 lb

## 2020-08-31 DIAGNOSIS — E119 Type 2 diabetes mellitus without complications: Secondary | ICD-10-CM | POA: Insufficient documentation

## 2020-08-31 DIAGNOSIS — Z171 Estrogen receptor negative status [ER-]: Secondary | ICD-10-CM

## 2020-08-31 DIAGNOSIS — C50412 Malignant neoplasm of upper-outer quadrant of left female breast: Secondary | ICD-10-CM

## 2020-08-31 DIAGNOSIS — Z6832 Body mass index (BMI) 32.0-32.9, adult: Secondary | ICD-10-CM | POA: Insufficient documentation

## 2020-08-31 DIAGNOSIS — I10 Essential (primary) hypertension: Secondary | ICD-10-CM | POA: Diagnosis not present

## 2020-08-31 DIAGNOSIS — Z170 Estrogen receptor positive status [ER+]: Secondary | ICD-10-CM | POA: Insufficient documentation

## 2020-08-31 DIAGNOSIS — Z0189 Encounter for other specified special examinations: Secondary | ICD-10-CM | POA: Diagnosis not present

## 2020-08-31 LAB — ECHOCARDIOGRAM COMPLETE
Area-P 1/2: 2.67 cm2
S' Lateral: 2.9 cm

## 2020-08-31 NOTE — Progress Notes (Signed)
  Echocardiogram 2D Echocardiogram has been performed.  Christy [name] 08/31/2020, 9:42 AM

## 2020-08-31 NOTE — Progress Notes (Signed)
START OFF PATHWAY REGIMEN - Breast   [id number]:Ado-Trastuzumab Emtansine 3.6 mg/kg IV D1 q21 Days:   A cycle is every 21 days:     Ado-trastuzumab emtansine   **Always confirm dose/schedule in your pharmacy ordering system**  Patient Characteristics: Distant Metastases or Locoregional Recurrent Disease - Unresected or Locally Advanced Unresectable Disease Progressing after Neoadjuvant and Local Therapies, HER2 Positive, ER Negative/Unknown, Chemotherapy, First Line Therapeutic Status: Distant Metastases ER Status: Negative (-) HER2 Status: Positive (+) PR Status: Negative (-) Line of Therapy: First Line Intent of Therapy: Non-Curative / Palliative Intent, Discussed with Patient

## 2020-09-01 ENCOUNTER — Telehealth: Payer: Self-pay | Admitting: Hematology

## 2020-09-01 ENCOUNTER — Encounter: Payer: Self-pay | Admitting: *Deleted

## 2020-09-01 NOTE — Telephone Encounter (Signed)
Scheduled follow-up appointments per 6/27 los. Patient is aware.

## 2020-09-10 NOTE — Progress Notes (Signed)
Pharmacist Chemotherapy Monitoring - Initial Assessment    Anticipated start date: 09/11/20   The following has been reviewed per standard work regarding the patient's treatment regimen: The patient's diagnosis, treatment plan and drug doses, and organ/hematologic function Lab orders and baseline tests specific to treatment regimen  The treatment plan start date, drug sequencing, and pre-medications Prior authorization status  Patient's documented medication list, including drug-drug interaction screen and prescriptions for anti-emetics and supportive care specific to the treatment regimen The drug concentrations, fluid compatibility, administration routes, and timing of the medications to be used The patient's access for treatment and lifetime cumulative dose history, if applicable  The patient's medication allergies and previous infusion related reactions, if applicable   Changes made to treatment plan:  N/A  Follow up needed:  N/A   Acquanetta Belling, [place], BCPS, BCOP 09/10/2020  3:28 PM

## 2020-09-11 ENCOUNTER — Other Ambulatory Visit: Payer: Self-pay

## 2020-09-11 ENCOUNTER — Other Ambulatory Visit: Payer: Self-pay | Admitting: Hematology

## 2020-09-11 ENCOUNTER — Inpatient Hospital Stay: Payer: Medicare Other

## 2020-09-11 ENCOUNTER — Other Ambulatory Visit: Payer: Self-pay | Admitting: Nurse Practitioner

## 2020-09-11 ENCOUNTER — Inpatient Hospital Stay: Payer: Medicare Other | Attending: Nurse Practitioner

## 2020-09-11 VITALS — BP 172/73 | HR 61 | Temp 97.7°F | Resp 17 | Wt 184.5 lb

## 2020-09-11 DIAGNOSIS — C50412 Malignant neoplasm of upper-outer quadrant of left female breast: Secondary | ICD-10-CM

## 2020-09-11 DIAGNOSIS — Z171 Estrogen receptor negative status [ER-]: Secondary | ICD-10-CM | POA: Diagnosis not present

## 2020-09-11 DIAGNOSIS — R5383 Other fatigue: Secondary | ICD-10-CM | POA: Diagnosis not present

## 2020-09-11 DIAGNOSIS — R296 Repeated falls: Secondary | ICD-10-CM | POA: Insufficient documentation

## 2020-09-11 DIAGNOSIS — G8929 Other chronic pain: Secondary | ICD-10-CM | POA: Insufficient documentation

## 2020-09-11 DIAGNOSIS — M4800 Spinal stenosis, site unspecified: Secondary | ICD-10-CM | POA: Insufficient documentation

## 2020-09-11 DIAGNOSIS — I6381 Other cerebral infarction due to occlusion or stenosis of small artery: Secondary | ICD-10-CM | POA: Diagnosis not present

## 2020-09-11 DIAGNOSIS — Z5112 Encounter for antineoplastic immunotherapy: Secondary | ICD-10-CM | POA: Insufficient documentation

## 2020-09-11 DIAGNOSIS — I700 Atherosclerosis of aorta: Secondary | ICD-10-CM | POA: Diagnosis not present

## 2020-09-11 DIAGNOSIS — N183 Chronic kidney disease, stage 3 unspecified: Secondary | ICD-10-CM | POA: Insufficient documentation

## 2020-09-11 DIAGNOSIS — R234 Changes in skin texture: Secondary | ICD-10-CM | POA: Diagnosis not present

## 2020-09-11 DIAGNOSIS — Z79899 Other long term (current) drug therapy: Secondary | ICD-10-CM | POA: Insufficient documentation

## 2020-09-11 DIAGNOSIS — R531 Weakness: Secondary | ICD-10-CM | POA: Insufficient documentation

## 2020-09-11 DIAGNOSIS — C773 Secondary and unspecified malignant neoplasm of axilla and upper limb lymph nodes: Secondary | ICD-10-CM | POA: Diagnosis not present

## 2020-09-11 DIAGNOSIS — I6782 Cerebral ischemia: Secondary | ICD-10-CM | POA: Insufficient documentation

## 2020-09-11 DIAGNOSIS — Z803 Family history of malignant neoplasm of breast: Secondary | ICD-10-CM | POA: Diagnosis not present

## 2020-09-11 DIAGNOSIS — C7800 Secondary malignant neoplasm of unspecified lung: Secondary | ICD-10-CM | POA: Insufficient documentation

## 2020-09-11 DIAGNOSIS — M549 Dorsalgia, unspecified: Secondary | ICD-10-CM | POA: Insufficient documentation

## 2020-09-11 LAB — CBC WITH DIFFERENTIAL (CANCER CENTER ONLY)
Abs Immature Granulocytes: 0.01 10*3/uL (ref 0.00–0.07)
Basophils Absolute: 0 10*3/uL (ref 0.0–0.1)
Basophils Relative: 1 %
Eosinophils Absolute: 0.2 10*3/uL (ref 0.0–0.5)
Eosinophils Relative: 5 %
HCT: 35.1 % — ABNORMAL LOW (ref 36.0–46.0)
Hemoglobin: 11.8 g/dL — ABNORMAL LOW (ref 12.0–15.0)
Immature Granulocytes: 0 %
Lymphocytes Relative: 33 %
Lymphs Abs: 1.6 10*3/uL (ref 0.7–4.0)
MCH: 24.6 pg — ABNORMAL LOW (ref 26.0–34.0)
MCHC: 33.6 g/dL (ref 30.0–36.0)
MCV: 73.3 fL — ABNORMAL LOW (ref 80.0–100.0)
Monocytes Absolute: 0.3 10*3/uL (ref 0.1–1.0)
Monocytes Relative: 6 %
Neutro Abs: 2.7 10*3/uL (ref 1.7–7.7)
Neutrophils Relative %: 55 %
Platelet Count: 304 10*3/uL (ref 150–400)
RBC: 4.79 MIL/uL (ref 3.87–5.11)
RDW: 15.5 % (ref 11.5–15.5)
WBC Count: 4.8 10*3/uL (ref 4.0–10.5)
nRBC: 0 % (ref 0.0–0.2)

## 2020-09-11 LAB — CMP (CANCER CENTER ONLY)
ALT: 9 U/L (ref 0–44)
AST: 14 U/L — ABNORMAL LOW (ref 15–41)
Albumin: 3.7 g/dL (ref 3.5–5.0)
Alkaline Phosphatase: 68 U/L (ref 38–126)
Anion gap: 10 (ref 5–15)
BUN: 22 mg/dL (ref 8–23)
CO2: 24 mmol/L (ref 22–32)
Calcium: 10.5 mg/dL — ABNORMAL HIGH (ref 8.9–10.3)
Chloride: 109 mmol/L (ref 98–111)
Creatinine: 1.82 mg/dL — ABNORMAL HIGH (ref 0.44–1.00)
GFR, Estimated: 28 mL/min — ABNORMAL LOW (ref >60)
Glucose, Bld: 127 mg/dL — ABNORMAL HIGH (ref 70–99)
Potassium: 4.1 mmol/L (ref 3.5–5.1)
Sodium: 143 mmol/L (ref 135–145)
Total Bilirubin: 0.4 mg/dL (ref 0.3–1.2)
Total Protein: 7.5 g/dL (ref 6.5–8.1)

## 2020-09-11 MED ORDER — ACETAMINOPHEN 325 MG PO TABS
650.0000 mg | ORAL_TABLET | Freq: Once | ORAL | Status: AC
  Administered 2020-09-11: 650 mg via ORAL

## 2020-09-11 MED ORDER — SODIUM CHLORIDE 0.9 % IV SOLN
Freq: Once | INTRAVENOUS | Status: AC
Start: 2020-09-11 — End: 2020-09-11
  Filled 2020-09-11: qty 250

## 2020-09-11 MED ORDER — ACETAMINOPHEN 325 MG PO TABS
ORAL_TABLET | ORAL | Status: AC
  Filled 2020-09-11: qty 2

## 2020-09-11 MED ORDER — SODIUM CHLORIDE 0.9 % IV SOLN
3.0000 mg/kg | Freq: Once | INTRAVENOUS | Status: AC
  Administered 2020-09-11: 260 mg via INTRAVENOUS
  Filled 2020-09-11: qty 8

## 2020-09-11 MED ORDER — DIPHENHYDRAMINE HCL 25 MG PO CAPS
50.0000 mg | ORAL_CAPSULE | Freq: Once | ORAL | Status: AC
  Administered 2020-09-11: 50 mg via ORAL

## 2020-09-11 MED ORDER — DIPHENHYDRAMINE HCL 25 MG PO CAPS
ORAL_CAPSULE | ORAL | Status: AC
  Filled 2020-09-11: qty 2

## 2020-09-11 NOTE — Progress Notes (Signed)
Per Dr. Burr Medico, okay to treat with elevated SCr.

## 2020-09-11 NOTE — Patient Instructions (Addendum)
[facility]  Discharge Instructions: Thank you for choosing [facility] to provide your oncology and hematology care.   If you have a lab appointment with the [facility], please go directly to the [facility] and check in at the registration area.   Wear comfortable clothing and clothing appropriate for easy access to any Portacath or PICC line.   We strive to give you quality time with your provider. You may need to reschedule your appointment if you arrive late (15 or more minutes).  Arriving late affects you and other patients whose appointments are after yours.  Also, if you miss three or more appointments without notifying the office, you may be dismissed from the clinic at the provider's discretion.      For prescription refill requests, have your pharmacy contact our office and allow 72 hours for refills to be completed.    Today you received the following chemotherapy and/or immunotherapy agents: Kadcyla.      To help prevent nausea and vomiting after your treatment, we encourage you to take your nausea medication as directed.  BELOW ARE SYMPTOMS THAT SHOULD BE REPORTED IMMEDIATELY: *FEVER GREATER THAN 100.4 F (38 C) OR HIGHER *CHILLS OR SWEATING *NAUSEA AND VOMITING THAT IS NOT CONTROLLED WITH YOUR NAUSEA MEDICATION *UNUSUAL SHORTNESS OF BREATH *UNUSUAL BRUISING OR BLEEDING *URINARY PROBLEMS (pain or burning when urinating, or frequent urination) *BOWEL PROBLEMS (unusual diarrhea, constipation, pain near the anus) TENDERNESS IN MOUTH AND THROAT WITH OR WITHOUT PRESENCE OF ULCERS (sore throat, sores in mouth, or a toothache) UNUSUAL RASH, SWELLING OR PAIN  UNUSUAL VAGINAL DISCHARGE OR ITCHING   Items with * indicate a potential emergency and should be followed up as soon as possible or go to the Emergency Department if any problems should occur.  Please show the CHEMOTHERAPY ALERT CARD or IMMUNOTHERAPY ALERT CARD at check-in to  the Emergency Department and triage nurse.  Should you have questions after your visit or need to cancel or reschedule your appointment, please contact [facility]  Dept: [phone]  and follow the prompts.  Office hours are 8:00 a.m. to 4:30 p.m. Monday - Friday. Please note that voicemails left after 4:00 p.m. may not be returned until the following business day.  We are closed weekends and major holidays. You have access to a nurse at all times for urgent questions. Please call the main number to the clinic Dept: [phone] and follow the prompts.   For any non-urgent questions, you may also contact your provider using MyChart. We now offer e-Visits for anyone 28 and older to request care online for non-urgent symptoms. For details visit [email or web address].   Also download the MyChart app! Go to the app store, search "MyChart", open the app, select [facility], and log in with your MyChart username and password.  Due to Covid, a mask is required upon entering the hospital/clinic. If you do not have a mask, one will be given to you upon arrival. For doctor visits, patients may have 1 support person aged 56 or older with them. For treatment visits, patients cannot have anyone with them due to current Covid guidelines and our immunocompromised population.   Ado-Trastuzumab Emtansine for injection What is this medication? ADO-TRASTUZUMAB EMTANSINE (ADD oh traz TOO zuh mab em TAN zine) is a monoclonalantibody combined with chemotherapy. It is used to treat breast cancer. This medicine may be used for other purposes; ask your health care provider orpharmacist if you have  questions. COMMON BRAND NAME(S): Kadcyla What should I tell my care team before I take this medication? They need to know if you have any of these conditions: heart disease heart failure infection (especially a virus infection such as chickenpox, cold sores, or herpes) liver disease lung or  breathing disease, like asthma tingling of the fingers or toes, or other nerve disorder an unusual or allergic reaction to ado-trastuzumab emtansine, other medications, foods, dyes, or preservatives pregnant or trying to get pregnant breast-feeding How should I use this medication? This medicine is for infusion into a vein. It is given by a health careprofessional in a hospital or clinic setting. Talk to your pediatrician regarding the use of this medicine in children.Special care may be needed. Overdosage: If you think you have taken too much of this medicine contact apoison control center or emergency room at once. NOTE: This medicine is only for you. Do not share this medicine with others. What if I miss a dose? It is important not to miss your dose. Call your doctor or health careprofessional if you are unable to keep an appointment. What may interact with this medication? This medicine may also interact with the following medications: atazanavir boceprevir clarithromycin delavirdine indinavir dalfopristin; quinupristin isoniazid, INH itraconazole ketoconazole nefazodone nelfinavir ritonavir telaprevir telithromycin tipranavir voriconazole This list may not describe all possible interactions. Give your health care provider a list of all the medicines, herbs, non-prescription drugs, or dietary supplements you use. Also tell them if you smoke, drink alcohol, or use illegaldrugs. Some items may interact with your medicine. What should I watch for while using this medication? Visit your doctor for checks on your progress. This drug may make you feel generally unwell. This is not uncommon, as chemotherapy can affect healthy cells as well as cancer cells. Report any side effects. Continue your course oftreatment even though you feel ill unless your doctor tells you to stop. You may need blood work done while you are taking this medicine. Call your doctor or health care professional  for advice if you get a fever, chills or sore throat, or other symptoms of a cold or flu. Do not treat yourself. This drug decreases your body's ability to fight infections. Try toavoid being around people who are sick. Be careful brushing and flossing your teeth or using a toothpick because you may get an infection or bleed more easily. If you have any dental work done,tell your dentist you are receiving this medicine. Avoid taking products that contain aspirin, acetaminophen, ibuprofen, naproxen, or ketoprofen unless instructed by your doctor. These medicines may hide afever. Do not become pregnant while taking this medicine or for 7 months after stopping it, men with female partners should use contraception during treatment and for 4 months after the last dose. Women should inform their doctor if they wish to become pregnant or think they might be pregnant. There is a potential for serious side effects to an unborn child. Do not breast-feed an infant whiletaking this medicine or for 7 months after the last dose. Men who have a partner who is pregnant or who is capable of becoming pregnant should use a condom during sexual activity while taking this medicine and for 4 months after stopping it. Men should inform their doctors if they wish to father a child. This medicine may lower sperm counts. Talk to your health careprofessional or pharmacist for more information. What side effects may I notice from receiving this medication? Side effects that you should report to your  doctor or health care professionalas soon as possible: allergic reactions like skin rash, itching or hives, swelling of the face, lips, or tongue breathing problems chest pain or palpitations fever or chills, sore throat general ill feeling or flu-like symptoms light-colored stools nausea, vomiting pain, tingling, numbness in the hands or feet signs and symptoms of bleeding such as bloody or black, tarry stools; red or dark-brown  urine; spitting up blood or brown material that looks like coffee grounds; red spots on the skin; unusual bruising or bleeding from the eye, gums, or nose swelling of the legs or ankles yellowing of the eyes or skin Side effects that usually do not require medical attention (report to yourdoctor or health care professional if they continue or are bothersome): changes in taste constipation dizziness headache joint pain muscle pain trouble sleeping unusually weak or tired This list may not describe all possible side effects. Call your doctor for medical advice about side effects. You may report side effects to FDA at1-800-FDA-1088. Where should I keep my medication? This drug is given in a hospital or clinic and will not be stored at home. NOTE: This sheet is a summary. It may not cover all possible information. If you have questions about this medicine, talk to your doctor, pharmacist, orhealth care provider.  2022 [employer]/Gold Standard (2017-07-21 10:03:15)

## 2020-09-12 LAB — CANCER ANTIGEN 27.29: CA 27.29: 55.6 U/mL — ABNORMAL HIGH (ref 0.0–38.6)

## 2020-09-14 ENCOUNTER — Telehealth: Payer: Self-pay | Admitting: *Deleted

## 2020-09-17 ENCOUNTER — Inpatient Hospital Stay (HOSPITAL_BASED_OUTPATIENT_CLINIC_OR_DEPARTMENT_OTHER): Payer: Medicare Other | Admitting: Internal Medicine

## 2020-09-17 ENCOUNTER — Other Ambulatory Visit: Payer: Self-pay

## 2020-09-17 DIAGNOSIS — C50412 Malignant neoplasm of upper-outer quadrant of left female breast: Secondary | ICD-10-CM | POA: Diagnosis not present

## 2020-09-17 DIAGNOSIS — I639 Cerebral infarction, unspecified: Secondary | ICD-10-CM | POA: Diagnosis not present

## 2020-09-17 DIAGNOSIS — I679 Cerebrovascular disease, unspecified: Secondary | ICD-10-CM | POA: Insufficient documentation

## 2020-09-17 MED ORDER — ASPIRIN EC 325 MG PO TBEC: 325.0000 mg | DELAYED_RELEASE_TABLET | Freq: Every day | ORAL | 5 refills | Status: DC

## 2020-09-17 MED ORDER — ATORVASTATIN CALCIUM 20 MG PO TABS: 20.0000 mg | ORAL_TABLET | Freq: Every day | ORAL | 5 refills | Status: DC

## 2020-09-17 NOTE — Progress Notes (Signed)
[facility] [place], [place] [address] [phone]   New Patient Evaluation  Date of Service: 09/17/20 Patient Name: Christy [name] Patient MRN: [id number] Patient DOB: [birth year] Provider: Ventura Sellers, MD  Identifying Statement:  Christy [name] is a 80 y.o. female with Cerebrovascular accident (CVA), unspecified mechanism ([place]) who presents for initial consultation and evaluation regarding cancer associated neurologic deficits.    Referring Provider: Nolene Ebbs, MD [address] [place],  [place] [address]  Primary Cancer:  Oncologic History: Oncology History  Malignant neoplasm of upper-outer quadrant of female breast ([place])  07/24/2020 Breast US   On physical exam, a very firm mass is identified at the 1:30 position of the LEFT breast 5 cm from the nipple.   Targeted ultrasound of the LEFT breast/axilla I   A 3.9 x 3.6 x 4.3 cm irregular hypoechoic mass at the 1:30 position 5 cm from the nipple. A 2.3 x 1.8 x 2.7 cm irregular mass at the 2 o'clock position 8 cm from the nipple. The 2 masses above encompass an area measuring at least 6.1 cm. Anterior LEFT breast skin thickening. Three abnormal LEFT axillary lymph nodes with cortical thickening.   IMPRESSION: 1. Highly suspicious 4.3 cm mass at the 1:30 position of the LEFT breast and highly suspicious 2.7 cm mass at the 2 o'clock position of the LEFT breast, with the 2 masses encompassing an area measuring at least 6.1 cm. Tissue sampling of both of these masses recommended. 2. 3 abnormal LEFT axillary lymph nodes with cortical thickening, tissue sampling of 1 of these lymph nodes recommended. 3. Anterior LEFT breast skin thickening nonspecific but may reflect dermal involvement. 4. No mammographic evidence of RIGHT breast malignancy.   07/31/2020 Initial Biopsy   Diagnosis 1. Breast, left, needle core biopsy, 1 :30 pm, 5cmfn - INVASIVE DUCTAL  CARCINOMA, SEE COMMENT. 2. Breast, left, needle core biopsy, 2 o'clock, 8cmfn - INVASIVE DUCTAL CARCINOMA, SEE COMMENT. - DUCTAL CARCINOMA IN SITU. 3. Lymph node, needle/core biopsy, left axillary - METASTATIC CARCINOMA IN A LYMPH NODE. Microscopic Comment 1. and 3. The carcinoma in both parts has a similar morphology and appears grade 2-3. The DCIS in part 2 is high-grade with necrosis. The carcinoma measures 15 mm (part 1) and 14 mm (part 2) in greatest linear extent  2. PROGNOSTIC INDICATORS Results: IMMUNOHISTOCHEMICAL AND MORPHOMETRIC ANALYSIS PERFORMED MANUALLY The tumor cells are POSITIVE for Her2 (3+). Estrogen Receptor: 0%, NEGATIVE Progesterone Receptor: 0%, NEGATIVE Proliferation Marker Ki67: 20%    07/31/2020 Cancer Staging   Staging form: Breast, AJCC 8th Edition - Clinical stage from 07/31/2020: Stage IV (cT2, cN3, cM1, G2, ER-, PR-, HER2+) - Signed by Alla Feeling, NP on 08/31/2020  Stage prefix: Initial diagnosis  Nuclear grade: G2  Histologic grading system: 3 grade system    08/13/2020 Initial Diagnosis   Malignant neoplasm of upper-outer quadrant of female breast ([facility])   08/28/2020 PET scan   IMPRESSION: Signs of LEFT breast cancer with LEFT axillary and retropectoral adenopathy as well as LEFT supraclavicular adenopathy.   Signs of pulmonary metastatic disease.   Mild asymmetric uptake in the LEFT as compared to the RIGHT adrenal gland. Equivocal but suspicious, attention on follow-up.   Aortic atherosclerosis.   Cystic changes and potential bronchiectasis in the RIGHT lung base likely post infectious or inflammatory. Correlate with any respiratory symptoms. Comparison with prior imaging may be helpful with attention on follow-up.   Aortic Atherosclerosis (ICD10-I70.0).  08/28/2020 Imaging   BRAIN MRI IMPRESSION: 1. Subacute lacunar infarct of the left basal ganglia with no malignant hemorrhagic transformation or mass effect.  Underlying chronic bilateral basal ganglia ischemia. 2. No metastatic disease or other acute intracranial abnormality identified.   09/11/2020 -  Chemotherapy    Patient is on Treatment Plan: BREAST ADO-TRASTUZUMAB EMTANSINE ([place]) Q21D         History of Present Illness: The patient's records from the referring physician were obtained and reviewed and the patient interviewed to confirm this HPI.  Christy [name] presented to neurologic attention several weeks ago, with complaint of right leg weakness and right hand clumsiness.  She describes acute decline in right leg function, on top of chronic orthopedic and pain associated limitations of both legs.  She was baseline ambulatory with a walker only prior to this change, and continues to be, although overall activity level is minimal.  She also describes change in right hand function, with noted difficulty in producing clean handwriting and buttoning small shirt buttons.  Otherwise no other neurologic complaints.  Brain MRI demonstrate a stroke, which prompted eval today.  She has been dosing aspirin 58m daily per Dr. FBurr Medicoinstructions.    Medications: Current Outpatient Medications on File Prior to Visit  Medication Sig Dispense Refill   aspirin EC 81 MG tablet Take 81 mg by mouth daily. Swallow whole.     clotrimazole (CLOTRIMAZOLE AF) 1 % cream Apply 1 application topically 2 (two) times daily. 30 g 0   metFORMIN (GLUCOPHAGE) 500 MG tablet Take 1 tablet (500 mg total) by mouth 2 (two) times daily with a meal. 60 tablet 1   olmesartan (BENICAR) 40 MG tablet      amLODipine (NORVASC) 5 MG tablet  (Patient not taking: Reported on 09/17/2020)     cetirizine (ZYRTEC ALLERGY) 10 MG tablet Take 1 tablet (10 mg total) by mouth daily. (Patient not taking: Reported on 09/17/2020) 30 tablet 0   fluticasone (FLONASE) 50 MCG/ACT nasal spray Place 1 spray into both nostrils daily. (Patient not taking: Reported on 09/17/2020) 1 g 0   No current  facility-administered medications on file prior to visit.    Allergies: No Known Allergies Past Medical History:  Past Medical History:  Diagnosis Date   Diabetes mellitus without complication ([place]    Hyperlipidemia    Hypertension    Past Surgical History:  Past Surgical History:  Procedure Laterality Date   FOOT FRACTURE SURGERY Right    Social History:  Social History   Socioeconomic History   Marital status: Single    Spouse name: Not on file   Number of children: Not on file   Years of education: Not on file   Highest education level: Not on file  Occupational History   Not on file  Tobacco Use   Smoking status: Former   Smokeless tobacco: Never  Vaping Use   Vaping Use: Never used  Substance and Sexual Activity   Alcohol use: Not Currently   Drug use: Never   Sexual activity: Not on file  Other Topics Concern   Not on file  Social History Narrative   Not on file   Social Determinants of Health   Financial Resource Strain: Not on file  Food Insecurity: Not on file  Transportation Needs: Not on file  Physical Activity: Not on file  Stress: Not on file  Social Connections: Not on file  Intimate Partner Violence: Not At Risk   Fear of Current or Ex-Partner:  No   Emotionally Abused: No   Physically Abused: No   Sexually Abused: No   Family History:  Family History  Problem Relation Age of Onset   Cancer Sister 26       Breast   Cancer Niece        Possibly breast    Review of Systems: Constitutional: Doesn't report fevers, chills or abnormal weight loss Eyes: Doesn't report blurriness of vision Ears, nose, mouth, throat, and face: Doesn't report sore throat Respiratory: Doesn't report cough, dyspnea or wheezes Cardiovascular: Doesn't report palpitation, chest discomfort  Gastrointestinal:  Doesn't report nausea, constipation, diarrhea GU: Doesn't report incontinence Skin: Doesn't report skin rashes Neurological: Per HPI Musculoskeletal:  Doesn't report joint pain Behavioral/Psych: Doesn't report anxiety  Physical Exam: Vitals:   09/17/20 1010  BP: 140/71  Pulse: 78  Resp: 18  Temp: 97.7 F (36.5 C)  SpO2: 98%   KPS: 70. General: Alert, cooperative, pleasant, in no acute distress Head: Normal EENT: No conjunctival injection or scleral icterus.  Lungs: Resp effort normal Cardiac: Regular rate Abdomen: Non-distended abdomen Skin: No rashes cyanosis or petechiae. Extremities: No clubbing or edema  Neurologic Exam: Mental Status: Awake, alert, attentive to examiner. Oriented to self and environment. Language is fluent with intact comprehension.  Cranial Nerves: Visual acuity is grossly normal. Visual fields are full. Extra-ocular movements intact. No ptosis. Face is symmetric Motor: Tone and bulk are normal. 4/5 in right leg, pain limited.  Slight drift in right arm only. Reflexes are symmetric, no pathologic reflexes present.  Sensory: Intact to light touch Gait: Deferred   Labs: I have reviewed the data as listed    Component Value Date/Time   NA 143 09/11/2020 1121   K 4.1 09/11/2020 1121   CL 109 09/11/2020 1121   CO2 24 09/11/2020 1121   GLUCOSE 127 (H) 09/11/2020 1121   BUN 22 09/11/2020 1121   CREATININE 1.82 (H) 09/11/2020 1121   CALCIUM 10.5 (H) 09/11/2020 1121   PROT 7.5 09/11/2020 1121   ALBUMIN 3.7 09/11/2020 1121   AST 14 (L) 09/11/2020 1121   ALT 9 09/11/2020 1121   ALKPHOS 68 09/11/2020 1121   BILITOT 0.4 09/11/2020 1121   GFRNONAA 28 (L) 09/11/2020 1121   GFRAA 39 (L) 06/25/2019 1324   Lab Results  Component Value Date   WBC 4.8 09/11/2020   NEUTROABS 2.7 09/11/2020   HGB 11.8 (L) 09/11/2020   HCT 35.1 (L) 09/11/2020   MCV 73.3 (L) 09/11/2020   PLT 304 09/11/2020    Imaging:  MR Brain W Wo Contrast  Result Date: 08/31/2020 CLINICAL DATA:  80 year old female with metastatic breast cancer. Right leg weakness. Confusion. EXAM: MRI HEAD WITHOUT AND WITH CONTRAST TECHNIQUE:  Multiplanar, multiecho pulse sequences of the brain and surrounding structures were obtained without and with intravenous contrast. CONTRAST:  61m GADAVIST GADOBUTROL 1 MMOL/ML IV SOLN COMPARISON:  PET-CT 08/28/2020. Lumbar MRI 07/14/2020. FINDINGS: Brain: Abnormal trace diffusion in the left lentiform nuclei which is mostly facilitated on ADC (series 5, image 73 and series 6, image 25). Some involvement of the left caudate also. Associated T2 and FLAIR hyperintensity plus microhemorrhage (series 9, image 38) but no mass effect. Superimposed additional chronic lacunar infarcts in the bilateral basal ganglia. No other abnormal diffusion. No cortical encephalomalacia is identified. No definite chronic cerebral blood products outside of the basal ganglia. Mild for age white matter signal changes at outside of the deep white matter capsules. No midline shift, mass effect, evidence of mass  lesion, ventriculomegaly, extra-axial collection or acute intracranial hemorrhage. Cervicomedullary junction and pituitary are within normal limits. Following contrast no abnormal enhancement is identified. No dural thickening. Vascular: Major intracranial vascular flow voids are preserved. The right vertebral artery appears dominant. The major dural venous sinuses are enhancing and appear to be patent. Skull and upper cervical spine: Multilevel cervical spine degeneration with mild spinal stenosis visible at C3-C4 and C4-C5. Visualized bone marrow signal is within normal limits. Sinuses/Orbits: Negative. Other: Trace right mastoid air cell fluid appears inconsequential. Visible internal auditory structures appear normal. Negative visible scalp and face. IMPRESSION: 1. Subacute lacunar infarct of the left basal ganglia with no malignant hemorrhagic transformation or mass effect. Underlying chronic bilateral basal ganglia ischemia. 2. No metastatic disease or other acute intracranial abnormality identified. Electronically Signed   By: Genevie Ann M.D.   On: 08/31/2020 07:23   MR BREAST BILATERAL W WO CONTRAST INC CAD  Result Date: 09/01/2020 CLINICAL DATA:  Recently diagnosed invasive ductal carcinoma in the 30 o'clock position of the left breast, invasive ductal carcinoma and high-grade ductal carcinoma in situ in the 2 o'clock position of the left breast and metastatic left axillary lymph node. LABS:  None obtained on site today. EXAM: BILATERAL BREAST MRI WITH AND WITHOUT CONTRAST TECHNIQUE: Multiplanar, multisequence MR images of both breasts were obtained prior to and following the intravenous administration of 8 ml of Gadavist Three-dimensional MR images were rendered by post-processing of the original MR data on an independent workstation. The three-dimensional MR images were interpreted, and findings are reported in the following complete MRI report for this study. Three dimensional images were evaluated at the independent interpreting workstation using the DynaCAD thin client. COMPARISON:  Recent mammogram, ultrasound, biopsy PET-CT examinations. FINDINGS: Breast composition: c. Heterogeneous fibroglandular tissue. Background parenchymal enhancement: Minimal Right breast: No mass or abnormal enhancement. Left breast: Multiple irregular, partially confluent masses in the central, upper outer and lower outer quadrants of the left breast. These contain 2 biopsy marker clip artifacts. These span an area measuring 8.1 x 8.1 x 5.1 cm. These have a mixture of enhancement kinetics, including rapid wash-in/washout Lymph nodes: At least 7 abnormally enlarged lymph nodes with abnormal cortical thickening in the left axilla and retropectoral region. Ancillary findings:  None. IMPRESSION: 1. 8.1 x 8.1 x 5.1 cm area of biopsy-proven malignancy in the central, upper outer and lower outer quadrants of the left breast. 2. Diffuse anterior left breast skin thickening without abnormal enhancement. This may represent thickening due to lymphedema associated  with lymphatic obstruction. 3. Metastatic level 1 and level 2 left axillary adenopathy. 4. No evidence of malignancy on the right. RECOMMENDATION: Treatment plan. BI-RADS CATEGORY  6: Known biopsy-proven malignancy. Electronically Signed   By: Claudie Revering M.D.   On: 09/01/2020 15:49  NM PET Image Initial (PI) Skull Base To Thigh  Result Date: 08/29/2020 CLINICAL DATA:  Initial treatment strategy for LEFT breast cancer, history of COVID vaccination. EXAM: NUCLEAR MEDICINE PET SKULL BASE TO THIGH TECHNIQUE: 9.7 mCi F-18 FDG was injected intravenously. Full-ring PET imaging was performed from the skull base to thigh after the radiotracer. CT data was obtained and used for attenuation correction and anatomic localization. Fasting blood glucose: 99 mg/dl COMPARISON:  MRI of the brain dated August 28, 2020. FINDINGS: Mediastinal blood pool activity: SUV max 2.95 Liver activity: SUV max NA NECK: No hypermetabolic lymph nodes in the neck. Incidental CT findings: Areas of hypoattenuation of bilateral basal ganglia likely from prior infarct. Please see  MRI of the brain for further detail also acquired on August 28, 2020 reported separately. CHEST: Hypermetabolic LEFT breast mass (image 78/4) 3.1 x 2.5 cm with a maximum SUV of 14.1. Hypermetabolic LEFT axillary and retropectoral lymph nodes as well as a LEFT supraclavicular lymph node. (Image 62/4) along the edge of the LEFT pectoralis musculature are small lymph nodes, adjacent lymph nodes largest 8 mm. In total maximum SUV of 12.0. Biopsy clip in the LEFT axilla adjacent to hypermetabolic lymph node (image 69/4) 11 mm short axis. This in the adjacent lymph node that is approximately 1 cm with a maximum SUV of 7.4. (Image 62/4) LEFT axillary lymph node 12 mm with a maximum SUV of 9.4. At least 4 additional lymph nodes in the LEFT axilla. Tiny LEFT retropectoral lymph node on image 61/4 beneath the pectoralis minor with a maximum SUV of 3.2. High LEFT retropectoral lymph node  beneath the pectoralis major (image 52/3) measuring 6 mm with a maximum SUV of 4.6. (Image 50/4) LEFT thoracic inlet lymph node approximately 4 mm short axis with a maximum SUV of 8.8. Scattered pulmonary nodules show mild FDG uptake. (Image 81/4) peripheral pulmonary nodule in the RIGHT mid chest measuring 6 mm in the RIGHT middle lobe maximum SUV of 3.8. Peripheral pulmonary nodule on image 75 of series 4 approximately 6 mm showing a maximum SUV of 3.5 Adjacent small nodule on image 30/8 is 3 mm size. LEFT upper lobe pulmonary nodule (image 23/a 3 mm. LEFT lower lobe pulmonary nodule (image 37/8) 3 mm. Mild bronchial wall thickening to the RIGHT lower lobe with cystic areas at the peripheral aspect of the RIGHT lower lobe raising the question of some bronchiectatic change in this area not associated with significant metabolic activity. Airways are patent though with mild narrowing of the trachea and mainstem bronchi potentially due to bronchomalacia. Incidental CT findings: Calcified atheromatous plaque of the thoracic aorta. No aneurysmal dilation. Normal heart size. Mitral annular calcification. Normal caliber of the central pulmonary vasculature. Patulous esophagus. ABDOMEN/PELVIS: Mild nodularity of the LEFT adrenal gland. FDG uptake slightly greater than background liver at 4.2 with uptake in the liver at 3.6. No adenopathy or other suspicious finding in the abdomen or in the pelvis. Incidental CT findings: No acute findings related to liver, gallbladder, pancreas, spleen, adrenal glands or kidneys. Nephrolithiasis in the bilateral kidneys largest calculus in the lower pole the RIGHT kidney 5 mm. Single 3 mm calculus in the LEFT kidney. No ureteral dilation, perinephric stranding or stranding about the urinary bladder. Normal appendix. No acute gastrointestinal findings. Calcified atheromatous plaque of the abdominal aorta without aneurysmal dilation. Unremarkable appearance of reproductive structures in the  pelvis. SKELETON: No focal hypermetabolic activity to suggest skeletal metastasis. Incidental CT findings: none IMPRESSION: Signs of LEFT breast cancer with LEFT axillary and retropectoral adenopathy as well as LEFT supraclavicular adenopathy. Signs of pulmonary metastatic disease. Mild asymmetric uptake in the LEFT as compared to the RIGHT adrenal gland. Equivocal but suspicious, attention on follow-up. Aortic atherosclerosis. Cystic changes and potential bronchiectasis in the RIGHT lung base likely post infectious or inflammatory. Correlate with any respiratory symptoms. Comparison with prior imaging may be helpful with attention on follow-up. Aortic Atherosclerosis (ICD10-I70.0). Electronically Signed   By: Zetta Bills M.D.   On: 08/29/2020 08:39   ECHOCARDIOGRAM COMPLETE  Result Date: 08/31/2020    ECHOCARDIOGRAM REPORT   Patient Name:   Christy [name] Date of Exam: 08/31/2020 Medical Rec #:  [id number]  Height:       64.0 in Accession #:    [id number]        Weight:       194.7 lb Date of Birth:  1941/02/19          BSA:          1.934 m Patient Age:    67 years          BP:           181/69 mmHg Patient Gender: F                 HR:           65 bpm. Exam Location:  Outpatient Procedure: 2D Echo, Cardiac Doppler, Color Doppler and Strain Analysis Indications:    Chemo evaluation  History:        Patient has no prior history of Echocardiogram examinations.                 Risk Factors:Hypertension, Diabetes and Obesity. Breast cancer.  Sonographer:    Dustin Flock Referring Phys: [id number] Alla Feeling  Sonographer Comments: Patient is morbidly obese. IMPRESSIONS  1. GLS appears to be inaccurate.  2. Left ventricular ejection fraction, by estimation, is 55 to 60%. The left ventricle has normal function. The left ventricle has no regional wall motion abnormalities. There is mild left ventricular hypertrophy. Left ventricular diastolic parameters are consistent with Grade I diastolic  dysfunction (impaired relaxation).  3. Right ventricular systolic function is normal. The right ventricular size is normal.  4. The mitral valve is normal in structure. Trivial mitral valve regurgitation. No evidence of mitral stenosis.  5. The aortic valve is normal in structure. Aortic valve regurgitation is not visualized. No aortic stenosis is present.  6. The inferior vena cava is normal in size with greater than 50% respiratory variability, suggesting right atrial pressure of 3 mmHg. FINDINGS  Left Ventricle: Left ventricular ejection fraction, by estimation, is 55 to 60%. The left ventricle has normal function. The left ventricle has no regional wall motion abnormalities. The left ventricular internal cavity size was normal in size. There is  mild left ventricular hypertrophy. Left ventricular diastolic parameters are consistent with Grade I diastolic dysfunction (impaired relaxation). Right Ventricle: The right ventricular size is normal.Right ventricular systolic function is normal. Left Atrium: Left atrial size was normal in size. Right Atrium: Right atrial size was normal in size. Pericardium: There is no evidence of pericardial effusion. Mitral Valve: The mitral valve is normal in structure. Mild mitral annular calcification. Trivial mitral valve regurgitation. No evidence of mitral valve stenosis. Tricuspid Valve: The tricuspid valve is normal in structure. Tricuspid valve regurgitation is trivial. No evidence of tricuspid stenosis. Aortic Valve: The aortic valve is normal in structure. Aortic valve regurgitation is not visualized. No aortic stenosis is present. Pulmonic Valve: The pulmonic valve was not well visualized. Pulmonic valve regurgitation is not visualized. No evidence of pulmonic stenosis. Aorta: The aortic root is normal in size and structure. Venous: The inferior vena cava is normal in size with greater than 50% respiratory variability, suggesting right atrial pressure of 3 mmHg.  IAS/Shunts: No atrial level shunt detected by color flow Doppler. Additional Comments: GLS appears to be inaccurate.  LEFT VENTRICLE PLAX 2D LVIDd:         4.30 cm  Diastology LVIDs:         2.90 cm  LV e' medial:    8.05 cm/s LV PW:  1.30 cm  LV E/e' medial:  7.4 LV IVS:        1.20 cm  LV e' lateral:   7.51 cm/s LVOT diam:     2.00 cm  LV E/e' lateral: 7.9 LV SV:         52 LV SV Index:   27 LVOT Area:     3.14 cm  RIGHT VENTRICLE RV Basal diam:  2.40 cm RV S prime:     13.40 cm/s TAPSE (M-mode): 2.1 cm LEFT ATRIUM             Index       RIGHT ATRIUM          Index LA diam:        3.30 cm 1.71 cm/m  RA Area:     9.81 cm LA Vol (A2C):   18.7 ml 9.67 ml/m  RA Volume:   19.70 ml 10.19 ml/m LA Vol (A4C):   25.9 ml 13.39 ml/m LA Biplane Vol: 22.6 ml 11.68 ml/m  AORTIC VALVE LVOT Vmax:   88.70 cm/s LVOT Vmean:  60.300 cm/s LVOT VTI:    0.165 m  AORTA Ao Root diam: 3.00 cm MITRAL VALVE MV Area (PHT): 2.67 cm    SHUNTS MV Decel Time: 284 msec    Systemic VTI:  0.16 m MV E velocity: 59.20 cm/s  Systemic Diam: 2.00 cm MV A velocity: 94.60 cm/s MV E/A ratio:  0.63 Kirk Ruths MD Electronically signed by Kirk Ruths MD Signature Date/Time: 08/31/2020/10:01:51 AM    Final      Assessment/Plan Cerebrovascular accident (CVA), unspecified mechanism ([place])  Christy [name] presents today with clinical and radiographic syndrome consistent with acute left internal capsule infarct.  Relevant workup includes brain MRI, EKG, echocardiogram, all reviewed.  No vascular imaging at this point.  Risk factors include age, hypertension, diabetes, cancer syndrome.    We discussed and recommended deferring vascular imaging because of her poor candidacy for large vessel intervention.  Pattern on MRI is most consistent with small vessel infarction syndrome, regardless.  Do not suspect embolic etiology at this time.  Recommended increasing to full dose ASA 338m if tolerated, and initiating therapy with atorvastatin  290mdaily pending lipid panel.  Statin and hypertension regimen can be further titrated by PCP if preferred.  We counseled her and her daughter extensively on secondary stroke prevention, risk factor modification.    They are not interested in rehab services at this time.  Will cont on Kadcyla monotherapy for now with Dr. FeBurr Medico We spent twenty additional minutes teaching regarding the natural history, biology, and historical experience in the treatment of neurologic complications of cancer.   We appreciate the opportunity to participate in the care of Christy [name]  All questions were answered. The patient knows to call the clinic with any problems, questions or concerns. No barriers to learning were detected.  The total time spent in the encounter was 40 minutes and more than 50% was on counseling and review of test results   ZaVentura SellersMD Medical Director of Neuro-Oncology [facility] [facility] We[facility]ard7/14/22 4:29 PM

## 2020-09-20 NOTE — Progress Notes (Signed)
[place]   Telephone:([phone]) [phone] Fax:(336) [phone]   Clinic Follow up Note   Patient Care Team: Nolene Ebbs, MD as PCP - General (Internal Medicine) Mauro Kaufmann, RN as Oncology Nurse Navigator Rockwell Germany, RN as Oncology Nurse Navigator  Date of Service:  09/21/2020  CHIEF COMPLAINT: f/u of metastatic breast Christy [name]  SUMMARY OF ONCOLOGIC HISTORY: Oncology History Overview Note  Christy [name] Staging Malignant neoplasm of upper-outer quadrant of Christy [name] breast [facility]) Staging form: Breast, AJCC 8th Edition - Clinical stage from 07/31/2020: Stage IV (cT2, cN3, cM1, G2, ER-, PR-, HER2+) - Signed by Alla Feeling, NP on 08/31/2020 Stage prefix: Initial diagnosis Nuclear grade: G2 Histologic grading system: 3 grade system    Malignant neoplasm of upper-outer quadrant of Christy [name] breast ([place])  07/24/2020 Breast US   On physical exam, a very firm mass is identified at the 1:30 position of the LEFT breast 5 cm from the nipple.   IMPRESSION: 1. Highly suspicious 4.3 cm mass at the 1:30 position of the LEFT breast and highly suspicious 2.7 cm mass at the 2 o'clock position of the LEFT breast, with the 2 masses encompassing an area measuring at least 6.1 cm. 2. 3 abnormal LEFT axillary lymph nodes with cortical thickening, tissue sampling of 1 of these lymph nodes recommended. 3. Anterior LEFT breast skin thickening nonspecific but may reflect dermal involvement. 4. No mammographic evidence of RIGHT breast malignancy.   07/31/2020 Initial Biopsy   Diagnosis 1. Breast, left, needle core biopsy, 1 :30 pm, 5cmfn - INVASIVE DUCTAL CARCINOMA, SEE COMMENT. 2. Breast, left, needle core biopsy, 2 o'clock, 8cmfn - INVASIVE DUCTAL CARCINOMA, SEE COMMENT. - DUCTAL CARCINOMA IN SITU. 3. Lymph node, needle/core biopsy, left axillary - METASTATIC CARCINOMA IN A LYMPH NODE. Microscopic Comment 1. and 3. The carcinoma in both parts has a similar morphology and appears grade  2-3. The DCIS in part 2 is high-grade with necrosis. The carcinoma measures 15 mm (part 1) and 14 mm (part 2) in greatest linear extent  2. PROGNOSTIC INDICATORS Results: IMMUNOHISTOCHEMICAL AND MORPHOMETRIC ANALYSIS PERFORMED MANUALLY The tumor cells are POSITIVE for Her2 (3+). Estrogen Receptor: 0%, NEGATIVE Progesterone Receptor: 0%, NEGATIVE Proliferation Marker Ki67: 20%    07/31/2020 Christy [name] Staging   Staging form: Breast, AJCC 8th Edition - Clinical stage from 07/31/2020: Stage IV (cT2, cN3, cM1, G2, ER-, PR-, HER2+) - Signed by Alla Feeling, NP on 08/31/2020  Stage prefix: Initial diagnosis  Nuclear grade: G2  Histologic grading system: 3 grade system    08/13/2020 Initial Diagnosis   Malignant neoplasm of upper-outer quadrant of Christy [name] breast ([place])   08/28/2020 PET scan   IMPRESSION: Signs of LEFT breast Christy [name] with LEFT axillary and retropectoral adenopathy as well as LEFT supraclavicular adenopathy.   Signs of pulmonary metastatic disease.   Mild asymmetric uptake in the LEFT as compared to the RIGHT adrenal gland. Equivocal but suspicious, attention on follow-up.   Aortic atherosclerosis.   Cystic changes and potential bronchiectasis in the RIGHT lung base likely post infectious or inflammatory. Correlate with any respiratory symptoms. Comparison with prior imaging may be helpful with attention on follow-up.   08/28/2020 Imaging   BRAIN MRI IMPRESSION: 1. Subacute lacunar infarct of the left basal ganglia with no malignant hemorrhagic transformation or mass effect. Underlying chronic bilateral basal ganglia ischemia. 2. No metastatic disease or other acute intracranial abnormality identified.   08/28/2020 Imaging   Breast MRI   IMPRESSION: 1. 8.1 x 8.1 x 5.1 cm area  of biopsy-proven malignancy in the central, upper outer and lower outer quadrants of the left breast. 2. Diffuse anterior left breast skin thickening without abnormal enhancement. This may  represent thickening due to lymphedema associated with lymphatic obstruction. 3. Metastatic level 1 and level 2 left axillary adenopathy. 4. No evidence of malignancy on the right.   09/11/2020 -  Chemotherapy    Patient is on Treatment Plan: BREAST ADO-TRASTUZUMAB EMTANSINE (KADCYLA) Q21D          CURRENT THERAPY:  Kadcyla every 3 weeks, started on 09/11/2020  INTERVAL HISTORY:  Christy Christy [name]. She was last seen by me with NP Lacie on 08/31/20. She presents to the clinic with her daughter.  She started Kadcyla on September 11, 2020, she reports mild fatigue after infusion, no significant nausea, diarrhea, or other side effects.  She is clinically stable, no other new complaints.  Weight is stable.   All other systems were reviewed with the patient and are negative.  MEDICAL HISTORY:  Past Medical History:  Diagnosis Date   Diabetes mellitus without complication ([place])    Hyperlipidemia    Hypertension     SURGICAL HISTORY: Past Surgical History:  Procedure Laterality Date   FOOT FRACTURE SURGERY Right     I have reviewed the social history and family history with the patient and they are unchanged from previous note.  ALLERGIES:  has No Known Allergies.  MEDICATIONS:  Current Outpatient Medications  Medication Sig Dispense Refill   amLODipine (NORVASC) 5 MG tablet  (Patient not taking: Reported on 09/17/2020)     aspirin EC 325 MG tablet Take 1 tablet (325 mg total) by mouth daily. 30 tablet 5   atorvastatin (LIPITOR) 20 MG tablet Take 1 tablet (20 mg total) by mouth daily. 30 tablet 5   cetirizine (ZYRTEC ALLERGY) 10 MG tablet Take 1 tablet (10 mg total) by mouth daily. (Patient not taking: Reported on 09/17/2020) 30 tablet 0   clotrimazole (CLOTRIMAZOLE AF) 1 % cream Apply 1 application topically 2 (two) times daily. 30 g 0   fluticasone (FLONASE) 50 MCG/ACT nasal spray Place 1 spray into both nostrils daily. (Patient not  taking: Reported on 09/17/2020) 1 g 0   metFORMIN (GLUCOPHAGE) 500 MG tablet Take 1 tablet (500 mg total) by mouth 2 (two) times daily with a meal. 60 tablet 1   olmesartan (BENICAR) 40 MG tablet      No current facility-administered medications for this visit.    PHYSICAL EXAMINATION: ECOG PERFORMANCE STATUS: 1 - Symptomatic but completely ambulatory  Vitals:   09/21/20 0911  BP: (!) 164/76  Pulse: 69  Resp: 18  Temp: (!) 97.4 F (36.3 C)  SpO2: 99%   Filed Weights   09/21/20 0911  Weight: 184 lb 14.4 oz (83.9 kg)    GENERAL:alert, no distress and comfortable SKIN: skin color, texture, turgor are normal, no rashes or significant lesions EYES: normal, Conjunctiva are pink and non-injected, sclera clear Musculoskeletal:no cyanosis of digits and no clubbing  NEURO: alert & oriented x 3 with fluent speech, no focal motor/sensory deficits Breast exam deferred today  LABORATORY DATA:  I have reviewed the data as listed CBC Latest Ref Rng & Units 09/21/2020 09/11/2020 08/20/2020  WBC 4.0 - 10.5 K/uL 6.5 4.8 4.6  Hemoglobin 12.0 - 15.0 g/dL 11.5(L) 11.8(L) 11.1(L)  Hematocrit 36.0 - 46.0 % 34.3(L) 35.1(L) 33.5(L)  Platelets 150 - 400 K/uL 161 304 267  CMP Latest Ref Rng & Units 09/21/2020 09/11/2020 08/20/2020  Glucose 70 - 99 mg/dL 120(H) 127(H) 101(H)  BUN 8 - 23 mg/dL $Remove'19 22 18  'WcQKDxc$ Creatinine 0.44 - 1.00 mg/dL 2.05(H) 1.82(H) 1.71(H)  Sodium 135 - 145 mmol/L 144 143 142  Potassium 3.5 - 5.1 mmol/L 3.8 4.1 4.1  Chloride 98 - 111 mmol/L 108 109 109  CO2 22 - 32 mmol/L $RemoveB'23 24 24  'tjjifNTR$ Calcium 8.9 - 10.3 mg/dL 10.6(H) 10.5(H) 10.2  Total Protein 6.5 - 8.1 g/dL 7.5 7.5 7.0  Total Bilirubin 0.3 - 1.2 mg/dL 0.3 0.4 0.4  Alkaline Phos 38 - 126 U/L 77 68 63  AST 15 - 41 U/L 49(H) 14(L) 10(L)  ALT 0 - 44 U/L 28 9 <6      RADIOGRAPHIC STUDIES: I have personally reviewed the radiological images as listed and agreed with the findings in the report. No results found.   ASSESSMENT & PLAN:   MARELY APGAR is a 80 y.o. Christy [name] with   1.Malignant neoplasm of overlapping sites in the central and upper out quadrant left breast, invasive ductal carcinoma, cT2N3M1 stage IV, grade 2-3, ER/PR negative HER2 positive, MIB-1 of 20% - with nodal and pulmonary metastases -She initially self palpated a left breast mass in 06/2020, possibly earlier.  Work-up showed 2 masses in the left upper outer breast and 3 abnormal lymph nodes.  Biopsy of the 2 masses and 1 lymph node all showed invasive ductal carcinoma, grade 2-3, ER/PR negative and HER2 positive. -PET scan 08/28/20 shows the hypermetabolic known left breast malignancy with hypermetabolic left axillary, retropectoral, and supraclavicular adenopathy.  Additionally there are several small 3-6 mm pulmonary nodules with mild hypermetabolic activity, felt to be consistent with pulmonary metastasis.  -baseline labs which shows CKD and mild anemia, CA 27.29 is normal. Baseline echo EF 55-60% -She was started on first-line systemic Kadcyla, given once every 3 weeks, on 09/11/20. -She tolerated first dose well, with some fatigue, no other noticeable side effects -She is scheduled for port placement by IR Christy [name] -We will see her back before cycle 2  2.  Genetics -Her sister had metastatic breast Christy [name] in her 68s, and that sisters daughter possibly have breast Christy [name]. -Due to family history and ER/PR negative disease, patient qualifies for genetics.  -Patient's daughter understands her breast Christy [name] risk and the importance of annual mammography.   3.Spinal stenosis -Reportedly diagnosed in [place], [place].  Patient has chronic back pain and left leg weakness at baseline -Recently she developed right leg weakness and frequent fall -PET is negative for spinal mets and brain MRI shows subacute lacunar infarct without metastatic disease    4. HTN, DM -on metformin, amlodipine, and olmesartan -f/up PCP Dr. Jeanie Cooks -Denies baseline neuropathy   5.   Subacute stroke -Her brain MRI showed subacute lacunar infarct of the left basal ganglia, she has no clinical symptoms of stroke. -She was seen by Dr. Mickeal Skinner on 09/17/20.    PLAN: -She is scheduled for port placement Christy [name] -Lab, flush, follow-up and cycle 2 Kadcyla on July 29    No problem-specific Assessment & Plan notes found for this encounter.   No orders of the defined types were placed in this encounter.  All questions were answered. The patient knows to call the clinic with any problems, questions or concerns. No barriers to learning was detected. The total time spent in the appointment was 20 minutes.     Truitt Merle, MD 09/21/2020   I, Wilburn Mylar, am acting  as scribe for Truitt Merle, MD.   I have reviewed the above documentation for accuracy and completeness, and I agree with the above.

## 2020-09-20 NOTE — H&P (Addendum)
Chief Complaint: Patient was seen in consultation today for image guided Port-A-Cath placement at the request of Burton,Lacie K  Referring Physician(s): Burton,Lacie K  Supervising Physician: Ruthann Cancer  Patient Status: [place]  History of Present Illness: Christy [name] is a 80 y.o. female with PMH of HTN, HLD, DM, CVA, and recent diagnosis of stage IV left breast cancer who is currently being treated with chemotherapy. Patient felt a left breast mass in April 2022 and underwent work-up which showed 2 masses in the left upper outer breast and three abnormal lymph nodes.  Biopsy  revealed invasive ductal carcinoma,  PET scan showed several hypermetabolic lesions concerning for metastasis.   After thorough discussion and shared decision making with her oncology team, patient decided to proceed with chemotherapy.   IR was requested for image guided Port-A-Cath placement.  Patient laying in bed, not in acute distress.  States that she is nervous about the procedure. Informed patient that port-a-cath placement is one of most common procedure we do in IR, and will make sure she will be comfortable during the case.  Denise headache, fever, chills, shortness of breath, cough, chest pain, abdominal pain, nausea ,vomiting, and bleeding.    Past Medical History:  Diagnosis Date   Diabetes mellitus without complication ([place])    Hyperlipidemia    Hypertension     Past Surgical History:  Procedure Laterality Date   FOOT FRACTURE SURGERY Right     Allergies: Patient has no known allergies.  Medications: Prior to Admission medications   Medication Sig Start Date End Date Taking? Authorizing Provider  amLODipine (NORVASC) 5 MG tablet  07/06/20   [provider]  aspirin EC 325 MG tablet Take 1 tablet (325 mg total) by mouth daily. 09/17/20   Ventura Sellers, MD  atorvastatin (LIPITOR) 20 MG tablet Take 1 tablet (20 mg total) by mouth daily. 09/17/20   Ventura Sellers, MD  cetirizine (ZYRTEC ALLERGY) 10 MG tablet Take 1 tablet (10 mg total) by mouth daily. Patient not taking: Reported on 09/17/2020 09/27/19 10/27/19  Loura Halt A, NP  clotrimazole (CLOTRIMAZOLE AF) 1 % cream Apply 1 application topically 2 (two) times daily. 06/12/20   Sharion Balloon, NP  fluticasone (FLONASE) 50 MCG/ACT nasal spray Place 1 spray into both nostrils daily. Patient not taking: Reported on 09/17/2020 06/25/19 07/25/19  Darr, Edison Nasuti, Christy-C  metFORMIN (GLUCOPHAGE) 500 MG tablet Take 1 tablet (500 mg total) by mouth 2 (two) times daily with a meal. 09/27/19   Orvan July, NP  olmesartan (BENICAR) 40 MG tablet  07/06/20   [provider]     Family History  Problem Relation Age of Onset   Cancer Sister 63       Breast   Cancer Niece        Possibly breast    Social History   Socioeconomic History   Marital status: Single    Spouse name: Not on file   Number of children: Not on file   Years of education: Not on file   Highest education level: Not on file  Occupational History   Not on file  Tobacco Use   Smoking status: Former   Smokeless tobacco: Never  Vaping Use   Vaping Use: Never used  Substance and Sexual Activity   Alcohol use: Not Currently   Drug use: Never   Sexual activity: Not on file  Other Topics Concern   Not on file  Social History Narrative  Not on file   Social Determinants of Health   Financial Resource Strain: Not on file  Food Insecurity: Not on file  Transportation Needs: Not on file  Physical Activity: Not on file  Stress: Not on file  Social Connections: Not on file     Review of Systems: A 12 point ROS discussed and pertinent positives are indicated in the HPI above.  All other systems are negative.   Vital Signs: BP (!) 174/85   Pulse 72   Temp 98.5 F (36.9 C) (Oral)   Resp 18   SpO2 100%   Physical Exam Vitals reviewed.  Constitutional:      General: She is not in acute distress.    Appearance: Normal  appearance. She is not ill-appearing.  HENT:     Head: Normocephalic and atraumatic.     Mouth/Throat:     Mouth: Mucous membranes are moist.  Cardiovascular:     Rate and Rhythm: Normal rate and regular rhythm.     Pulses: Normal pulses.     Heart sounds: Normal heart sounds.  Pulmonary:     Effort: Pulmonary effort is normal.     Breath sounds: Normal breath sounds.  Abdominal:     General: Abdomen is flat. Bowel sounds are normal.     Palpations: Abdomen is soft.  Musculoskeletal:     Cervical back: Neck supple.  Skin:    General: Skin is warm and dry.     Coloration: Skin is not jaundiced or pale.  Neurological:     Mental Status: She is alert and oriented to person, place, and time.  Psychiatric:        Behavior: Behavior normal.        Judgment: Judgment normal.     Comments: Nervous     MD Evaluation Airway: WNL Heart: WNL Abdomen: WNL Chest/ Lungs: WNL ASA  Classification: 3 Mallampati/Airway Score: Two  Imaging: MR Brain W Wo Contrast  Result Date: 08/31/2020 CLINICAL DATA:  80 year old female with metastatic breast cancer. Right leg weakness. Confusion. EXAM: MRI HEAD WITHOUT AND WITH CONTRAST TECHNIQUE: Multiplanar, multiecho pulse sequences of the brain and surrounding structures were obtained without and with intravenous contrast. CONTRAST:  36mL GADAVIST GADOBUTROL 1 MMOL/ML IV SOLN COMPARISON:  PET-CT 08/28/2020. Lumbar MRI 07/14/2020. FINDINGS: Brain: Abnormal trace diffusion in the left lentiform nuclei which is mostly facilitated on ADC (series 5, image 73 and series 6, image 25). Some involvement of the left caudate also. Associated T2 and FLAIR hyperintensity plus microhemorrhage (series 9, image 38) but no mass effect. Superimposed additional chronic lacunar infarcts in the bilateral basal ganglia. No other abnormal diffusion. No cortical encephalomalacia is identified. No definite chronic cerebral blood products outside of the basal ganglia. Mild for age  white matter signal changes at outside of the deep white matter capsules. No midline shift, mass effect, evidence of mass lesion, ventriculomegaly, extra-axial collection or acute intracranial hemorrhage. Cervicomedullary junction and pituitary are within normal limits. Following contrast no abnormal enhancement is identified. No dural thickening. Vascular: Major intracranial vascular flow voids are preserved. The right vertebral artery appears dominant. The major dural venous sinuses are enhancing and appear to be patent. Skull and upper cervical spine: Multilevel cervical spine degeneration with mild spinal stenosis visible at C3-C4 and C4-C5. Visualized bone marrow signal is within normal limits. Sinuses/Orbits: Negative. Other: Trace right mastoid air cell fluid appears inconsequential. Visible internal auditory structures appear normal. Negative visible scalp and face. IMPRESSION: 1. Subacute lacunar infarct of the  left basal ganglia with no malignant hemorrhagic transformation or mass effect. Underlying chronic bilateral basal ganglia ischemia. 2. No metastatic disease or other acute intracranial abnormality identified. Electronically Signed   By: Genevie Ann M.D.   On: 08/31/2020 07:23   MR BREAST BILATERAL W WO CONTRAST INC CAD  Result Date: 09/01/2020 CLINICAL DATA:  Recently diagnosed invasive ductal carcinoma in the 30 o'clock position of the left breast, invasive ductal carcinoma and high-grade ductal carcinoma in situ in the 2 o'clock position of the left breast and metastatic left axillary lymph node. LABS:  None obtained on site today. EXAM: BILATERAL BREAST MRI WITH AND WITHOUT CONTRAST TECHNIQUE: Multiplanar, multisequence MR images of both breasts were obtained prior to and following the intravenous administration of 8 ml of Gadavist Three-dimensional MR images were rendered by post-processing of the original MR data on an independent workstation. The three-dimensional MR images were interpreted,  and findings are reported in the following complete MRI report for this study. Three dimensional images were evaluated at the independent interpreting workstation using the DynaCAD thin client. COMPARISON:  Recent mammogram, ultrasound, biopsy PET-CT examinations. FINDINGS: Breast composition: c. Heterogeneous fibroglandular tissue. Background parenchymal enhancement: Minimal Right breast: No mass or abnormal enhancement. Left breast: Multiple irregular, partially confluent masses in the central, upper outer and lower outer quadrants of the left breast. These contain 2 biopsy marker clip artifacts. These span an area measuring 8.1 x 8.1 x 5.1 cm. These have a mixture of enhancement kinetics, including rapid wash-in/washout Lymph nodes: At least 7 abnormally enlarged lymph nodes with abnormal cortical thickening in the left axilla and retropectoral region. Ancillary findings:  None. IMPRESSION: 1. 8.1 x 8.1 x 5.1 cm area of biopsy-proven malignancy in the central, upper outer and lower outer quadrants of the left breast. 2. Diffuse anterior left breast skin thickening without abnormal enhancement. This may represent thickening due to lymphedema associated with lymphatic obstruction. 3. Metastatic level 1 and level 2 left axillary adenopathy. 4. No evidence of malignancy on the right. RECOMMENDATION: Treatment plan. BI-RADS CATEGORY  6: Known biopsy-proven malignancy. Electronically Signed   By: Claudie Revering M.D.   On: 09/01/2020 15:49  NM PET Image Initial (PI) Skull Base To Thigh  Result Date: 08/29/2020 CLINICAL DATA:  Initial treatment strategy for LEFT breast cancer, history of COVID vaccination. EXAM: NUCLEAR MEDICINE PET SKULL BASE TO THIGH TECHNIQUE: 9.7 mCi F-18 FDG was injected intravenously. Full-ring PET imaging was performed from the skull base to thigh after the radiotracer. CT data was obtained and used for attenuation correction and anatomic localization. Fasting blood glucose: 99 mg/dl COMPARISON:   MRI of the brain dated August 28, 2020. FINDINGS: Mediastinal blood pool activity: SUV max 2.95 Liver activity: SUV max NA NECK: No hypermetabolic lymph nodes in the neck. Incidental CT findings: Areas of hypoattenuation of bilateral basal ganglia likely from prior infarct. Please see MRI of the brain for further detail also acquired on August 28, 2020 reported separately. CHEST: Hypermetabolic LEFT breast mass (image 78/4) 3.1 x 2.5 cm with a maximum SUV of 14.1. Hypermetabolic LEFT axillary and retropectoral lymph nodes as well as a LEFT supraclavicular lymph node. (Image 62/4) along the edge of the LEFT pectoralis musculature are small lymph nodes, adjacent lymph nodes largest 8 mm. In total maximum SUV of 12.0. Biopsy clip in the LEFT axilla adjacent to hypermetabolic lymph node (image 69/4) 11 mm short axis. This in the adjacent lymph node that is approximately 1 cm with a maximum SUV of 7.4. (  Image 62/4) LEFT axillary lymph node 12 mm with a maximum SUV of 9.4. At least 4 additional lymph nodes in the LEFT axilla. Tiny LEFT retropectoral lymph node on image 61/4 beneath the pectoralis minor with a maximum SUV of 3.2. High LEFT retropectoral lymph node beneath the pectoralis major (image 52/3) measuring 6 mm with a maximum SUV of 4.6. (Image 50/4) LEFT thoracic inlet lymph node approximately 4 mm short axis with a maximum SUV of 8.8. Scattered pulmonary nodules show mild FDG uptake. (Image 81/4) peripheral pulmonary nodule in the RIGHT mid chest measuring 6 mm in the RIGHT middle lobe maximum SUV of 3.8. Peripheral pulmonary nodule on image 75 of series 4 approximately 6 mm showing a maximum SUV of 3.5 Adjacent small nodule on image 30/8 is 3 mm size. LEFT upper lobe pulmonary nodule (image 23/a 3 mm. LEFT lower lobe pulmonary nodule (image 37/8) 3 mm. Mild bronchial wall thickening to the RIGHT lower lobe with cystic areas at the peripheral aspect of the RIGHT lower lobe raising the question of some  bronchiectatic change in this area not associated with significant metabolic activity. Airways are patent though with mild narrowing of the trachea and mainstem bronchi potentially due to bronchomalacia. Incidental CT findings: Calcified atheromatous plaque of the thoracic aorta. No aneurysmal dilation. Normal heart size. Mitral annular calcification. Normal caliber of the central pulmonary vasculature. Patulous esophagus. ABDOMEN/PELVIS: Mild nodularity of the LEFT adrenal gland. FDG uptake slightly greater than background liver at 4.2 with uptake in the liver at 3.6. No adenopathy or other suspicious finding in the abdomen or in the pelvis. Incidental CT findings: No acute findings related to liver, gallbladder, pancreas, spleen, adrenal glands or kidneys. Nephrolithiasis in the bilateral kidneys largest calculus in the lower pole the RIGHT kidney 5 mm. Single 3 mm calculus in the LEFT kidney. No ureteral dilation, perinephric stranding or stranding about the urinary bladder. Normal appendix. No acute gastrointestinal findings. Calcified atheromatous plaque of the abdominal aorta without aneurysmal dilation. Unremarkable appearance of reproductive structures in the pelvis. SKELETON: No focal hypermetabolic activity to suggest skeletal metastasis. Incidental CT findings: none IMPRESSION: Signs of LEFT breast cancer with LEFT axillary and retropectoral adenopathy as well as LEFT supraclavicular adenopathy. Signs of pulmonary metastatic disease. Mild asymmetric uptake in the LEFT as compared to the RIGHT adrenal gland. Equivocal but suspicious, attention on follow-up. Aortic atherosclerosis. Cystic changes and potential bronchiectasis in the RIGHT lung base likely post infectious or inflammatory. Correlate with any respiratory symptoms. Comparison with prior imaging may be helpful with attention on follow-up. Aortic Atherosclerosis (ICD10-I70.0). Electronically Signed   By: Zetta Bills M.D.   On: 08/29/2020 08:39    ECHOCARDIOGRAM COMPLETE  Result Date: 08/31/2020    ECHOCARDIOGRAM REPORT   Patient Name:   Christy [name] Date of Exam: 08/31/2020 Medical Rec #:  [id number]         Height:       64.0 in Accession #:    [id number]        Weight:       194.7 lb Date of Birth:  28-Oct-1940          BSA:          1.934 m Patient Age:    66 years          BP:           181/69 mmHg Patient Gender: F  HR:           65 bpm. Exam Location:  Outpatient Procedure: 2D Echo, Cardiac Doppler, Color Doppler and Strain Analysis Indications:    Chemo evaluation  History:        Patient has no prior history of Echocardiogram examinations.                 Risk Factors:Hypertension, Diabetes and Obesity. Breast cancer.  Sonographer:    Dustin Flock Referring Phys: [id number] Alla Feeling  Sonographer Comments: Patient is morbidly obese. IMPRESSIONS  1. GLS appears to be inaccurate.  2. Left ventricular ejection fraction, by estimation, is 55 to 60%. The left ventricle has normal function. The left ventricle has no regional wall motion abnormalities. There is mild left ventricular hypertrophy. Left ventricular diastolic parameters are consistent with Grade I diastolic dysfunction (impaired relaxation).  3. Right ventricular systolic function is normal. The right ventricular size is normal.  4. The mitral valve is normal in structure. Trivial mitral valve regurgitation. No evidence of mitral stenosis.  5. The aortic valve is normal in structure. Aortic valve regurgitation is not visualized. No aortic stenosis is present.  6. The inferior vena cava is normal in size with greater than 50% respiratory variability, suggesting right atrial pressure of 3 mmHg. FINDINGS  Left Ventricle: Left ventricular ejection fraction, by estimation, is 55 to 60%. The left ventricle has normal function. The left ventricle has no regional wall motion abnormalities. The left ventricular internal cavity size was normal in size. There is  mild left  ventricular hypertrophy. Left ventricular diastolic parameters are consistent with Grade I diastolic dysfunction (impaired relaxation). Right Ventricle: The right ventricular size is normal.Right ventricular systolic function is normal. Left Atrium: Left atrial size was normal in size. Right Atrium: Right atrial size was normal in size. Pericardium: There is no evidence of pericardial effusion. Mitral Valve: The mitral valve is normal in structure. Mild mitral annular calcification. Trivial mitral valve regurgitation. No evidence of mitral valve stenosis. Tricuspid Valve: The tricuspid valve is normal in structure. Tricuspid valve regurgitation is trivial. No evidence of tricuspid stenosis. Aortic Valve: The aortic valve is normal in structure. Aortic valve regurgitation is not visualized. No aortic stenosis is present. Pulmonic Valve: The pulmonic valve was not well visualized. Pulmonic valve regurgitation is not visualized. No evidence of pulmonic stenosis. Aorta: The aortic root is normal in size and structure. Venous: The inferior vena cava is normal in size with greater than 50% respiratory variability, suggesting right atrial pressure of 3 mmHg. IAS/Shunts: No atrial level shunt detected by color flow Doppler. Additional Comments: GLS appears to be inaccurate.  LEFT VENTRICLE PLAX 2D LVIDd:         4.30 cm  Diastology LVIDs:         2.90 cm  LV e' medial:    8.05 cm/s LV PW:         1.30 cm  LV E/e' medial:  7.4 LV IVS:        1.20 cm  LV e' lateral:   7.51 cm/s LVOT diam:     2.00 cm  LV E/e' lateral: 7.9 LV SV:         52 LV SV Index:   27 LVOT Area:     3.14 cm  RIGHT VENTRICLE RV Basal diam:  2.40 cm RV S prime:     13.40 cm/s TAPSE (M-mode): 2.1 cm LEFT ATRIUM  Index       RIGHT ATRIUM          Index LA diam:        3.30 cm 1.71 cm/m  RA Area:     9.81 cm LA Vol (A2C):   18.7 ml 9.67 ml/m  RA Volume:   19.70 ml 10.19 ml/m LA Vol (A4C):   25.9 ml 13.39 ml/m LA Biplane Vol: 22.6 ml 11.68  ml/m  AORTIC VALVE LVOT Vmax:   88.70 cm/s LVOT Vmean:  60.300 cm/s LVOT VTI:    0.165 m  AORTA Ao Root diam: 3.00 cm MITRAL VALVE MV Area (PHT): 2.67 cm    SHUNTS MV Decel Time: 284 msec    Systemic VTI:  0.16 m MV E velocity: 59.20 cm/s  Systemic Diam: 2.00 cm MV A velocity: 94.60 cm/s MV E/A ratio:  0.63 Kirk Ruths MD Electronically signed by Kirk Ruths MD Signature Date/Time: 08/31/2020/10:01:51 AM    Final     Labs:  CBC: Recent Labs    08/20/20 1052 09/11/20 1121 09/21/20 0824  WBC 4.6 4.8 6.5  HGB 11.1* 11.8* 11.5*  HCT 33.5* 35.1* 34.3*  PLT 267 304 161    COAGS: No results for input(s): INR, APTT in the last 8760 hours.  BMP: Recent Labs    08/20/20 1052 09/11/20 1121 09/21/20 0824  NA 142 143 144  K 4.1 4.1 3.8  CL 109 109 108  CO2 24 24 23   GLUCOSE 101* 127* 120*  BUN 18 22 19   CALCIUM 10.2 10.5* 10.6*  CREATININE 1.71* 1.82* 2.05*  GFRNONAA 30* 28* 24*    LIVER FUNCTION TESTS: Recent Labs    08/20/20 1052 09/11/20 1121 09/21/20 0824  BILITOT 0.4 0.4 0.3  AST 10* 14* 49*  ALT 6 9 28   ALKPHOS 63 68 77  PROT 7.0 7.5 7.5  ALBUMIN 3.5 3.7 3.4*    TUMOR MARKERS: No results for input(s): AFPTM, CEA, CA199, CHROMGRNA in the last 8760 hours.  Assessment and Plan: 80 y.o. female with recent diagnosis of stage IV left breast cancer in need of Port-A-Cath for chemotherapy.  IR was requested for image guided Port-A-Cath placement. N.p.o. since midnight VS shows HTN 174/85 pt states that she did not take her HTN medicine this morning.  On ASA 325 milligram daily -no need for discontinuation per IR anticoagulation guideline.  Risks and benefits of image guided port-a-catheter placement was discussed with the patient including, but not limited to bleeding, infection, pneumothorax, or fibrin sheath development and need for additional procedures.  All of the patient's questions were answered, patient is agreeable to proceed. Consent signed and in  chart.   Thank you for this interesting consult.  I greatly enjoyed meeting Christy [name] and look forward to participating in their care.  A copy of this report was sent to the requesting provider on this date.  Electronically Signed: Tera Mater, Christy-C 09/22/2020, 2:02 PM   I spent a total of  30 Minutes   in face to face in clinical consultation, greater than 50% of which was counseling/coordinating care for image guided Port-A-Cath placement.

## 2020-09-21 ENCOUNTER — Other Ambulatory Visit: Payer: Self-pay | Admitting: Radiology

## 2020-09-21 ENCOUNTER — Inpatient Hospital Stay (HOSPITAL_BASED_OUTPATIENT_CLINIC_OR_DEPARTMENT_OTHER): Payer: Medicare Other | Admitting: Hematology

## 2020-09-21 ENCOUNTER — Encounter: Payer: Self-pay | Admitting: Hematology

## 2020-09-21 ENCOUNTER — Other Ambulatory Visit: Payer: Self-pay

## 2020-09-21 ENCOUNTER — Inpatient Hospital Stay: Payer: Medicare Other

## 2020-09-21 VITALS — BP 164/76 | HR 69 | Temp 97.4°F | Resp 18 | Ht 64.0 in | Wt 184.9 lb

## 2020-09-21 DIAGNOSIS — C50412 Malignant neoplasm of upper-outer quadrant of left female breast: Secondary | ICD-10-CM

## 2020-09-21 DIAGNOSIS — Z171 Estrogen receptor negative status [ER-]: Secondary | ICD-10-CM | POA: Diagnosis not present

## 2020-09-21 DIAGNOSIS — I639 Cerebral infarction, unspecified: Secondary | ICD-10-CM

## 2020-09-21 LAB — CMP (CANCER CENTER ONLY)
ALT: 28 U/L (ref 0–44)
AST: 49 U/L — ABNORMAL HIGH (ref 15–41)
Albumin: 3.4 g/dL — ABNORMAL LOW (ref 3.5–5.0)
Alkaline Phosphatase: 77 U/L (ref 38–126)
Anion gap: 13 (ref 5–15)
BUN: 19 mg/dL (ref 8–23)
CO2: 23 mmol/L (ref 22–32)
Calcium: 10.6 mg/dL — ABNORMAL HIGH (ref 8.9–10.3)
Chloride: 108 mmol/L (ref 98–111)
Creatinine: 2.05 mg/dL — ABNORMAL HIGH (ref 0.44–1.00)
GFR, Estimated: 24 mL/min — ABNORMAL LOW (ref >60)
Glucose, Bld: 120 mg/dL — ABNORMAL HIGH (ref 70–99)
Potassium: 3.8 mmol/L (ref 3.5–5.1)
Sodium: 144 mmol/L (ref 135–145)
Total Bilirubin: 0.3 mg/dL (ref 0.3–1.2)
Total Protein: 7.5 g/dL (ref 6.5–8.1)

## 2020-09-21 LAB — CBC WITH DIFFERENTIAL (CANCER CENTER ONLY)
Abs Immature Granulocytes: 0.02 10*3/uL (ref 0.00–0.07)
Basophils Absolute: 0 10*3/uL (ref 0.0–0.1)
Basophils Relative: 1 %
Eosinophils Absolute: 0.4 10*3/uL (ref 0.0–0.5)
Eosinophils Relative: 5 %
HCT: 34.3 % — ABNORMAL LOW (ref 36.0–46.0)
Hemoglobin: 11.5 g/dL — ABNORMAL LOW (ref 12.0–15.0)
Immature Granulocytes: 0 %
Lymphocytes Relative: 27 %
Lymphs Abs: 1.7 10*3/uL (ref 0.7–4.0)
MCH: 24.4 pg — ABNORMAL LOW (ref 26.0–34.0)
MCHC: 33.5 g/dL (ref 30.0–36.0)
MCV: 72.7 fL — ABNORMAL LOW (ref 80.0–100.0)
Monocytes Absolute: 0.6 10*3/uL (ref 0.1–1.0)
Monocytes Relative: 8 %
Neutro Abs: 3.8 10*3/uL (ref 1.7–7.7)
Neutrophils Relative %: 59 %
Platelet Count: 161 10*3/uL (ref 150–400)
RBC: 4.72 MIL/uL (ref 3.87–5.11)
RDW: 15.2 % (ref 11.5–15.5)
WBC Count: 6.5 10*3/uL (ref 4.0–10.5)
nRBC: 0 % (ref 0.0–0.2)

## 2020-09-21 LAB — LIPID PANEL
Cholesterol: 370 mg/dL — ABNORMAL HIGH (ref 0–200)
HDL: 38 mg/dL — ABNORMAL LOW (ref >40)
LDL Cholesterol: 278 mg/dL — ABNORMAL HIGH (ref 0–99)
Total CHOL/HDL Ratio: 9.7 RATIO
Triglycerides: 268 mg/dL — ABNORMAL HIGH (ref <150)
VLDL: 54 mg/dL — ABNORMAL HIGH (ref 0–40)

## 2020-09-22 ENCOUNTER — Ambulatory Visit (HOSPITAL_COMMUNITY)
Admission: RE | Admit: 2020-09-22 | Discharge: 2020-09-22 | Disposition: A | Discharge status: Home / Self Care | Payer: Medicare Other | Source: Ambulatory Visit | Attending: Nurse Practitioner | Admitting: Nurse Practitioner

## 2020-09-22 ENCOUNTER — Encounter (HOSPITAL_COMMUNITY): Payer: Self-pay

## 2020-09-22 ENCOUNTER — Other Ambulatory Visit: Payer: Self-pay

## 2020-09-22 DIAGNOSIS — Z7982 Long term (current) use of aspirin: Secondary | ICD-10-CM | POA: Insufficient documentation

## 2020-09-22 DIAGNOSIS — E785 Hyperlipidemia, unspecified: Secondary | ICD-10-CM | POA: Diagnosis not present

## 2020-09-22 DIAGNOSIS — Z7984 Long term (current) use of oral hypoglycemic drugs: Secondary | ICD-10-CM | POA: Diagnosis not present

## 2020-09-22 DIAGNOSIS — I10 Essential (primary) hypertension: Secondary | ICD-10-CM | POA: Diagnosis not present

## 2020-09-22 DIAGNOSIS — Z79899 Other long term (current) drug therapy: Secondary | ICD-10-CM | POA: Diagnosis not present

## 2020-09-22 DIAGNOSIS — Z9221 Personal history of antineoplastic chemotherapy: Secondary | ICD-10-CM | POA: Diagnosis not present

## 2020-09-22 DIAGNOSIS — E119 Type 2 diabetes mellitus without complications: Secondary | ICD-10-CM | POA: Diagnosis not present

## 2020-09-22 DIAGNOSIS — C50412 Malignant neoplasm of upper-outer quadrant of left female breast: Secondary | ICD-10-CM

## 2020-09-22 DIAGNOSIS — C3492 Malignant neoplasm of unspecified part of left bronchus or lung: Secondary | ICD-10-CM | POA: Insufficient documentation

## 2020-09-22 HISTORY — PX: IR IMAGING GUIDED PORT INSERTION: IMG5740

## 2020-09-22 LAB — GLUCOSE, CAPILLARY: Glucose-Capillary: 107 mg/dL — ABNORMAL HIGH (ref 70–99)

## 2020-09-22 IMAGING — US IR IMAGING GUIDED PORT INSERTION
3 series · 8 of 8 positions shown · non-contrast
Comparison: None.

INDICATION: 80-year-old female with history of recent diagnosis of advanced
stage left breast cancer requiring central venous access for
chemotherapy.

EXAM:
IMPLANTED PORT A CATH PLACEMENT WITH ULTRASOUND AND FLUOROSCOPIC
GUIDANCE

[Series 1: care body 4 · 2 acquisitions, 6 frames shown]
[im 1/2]
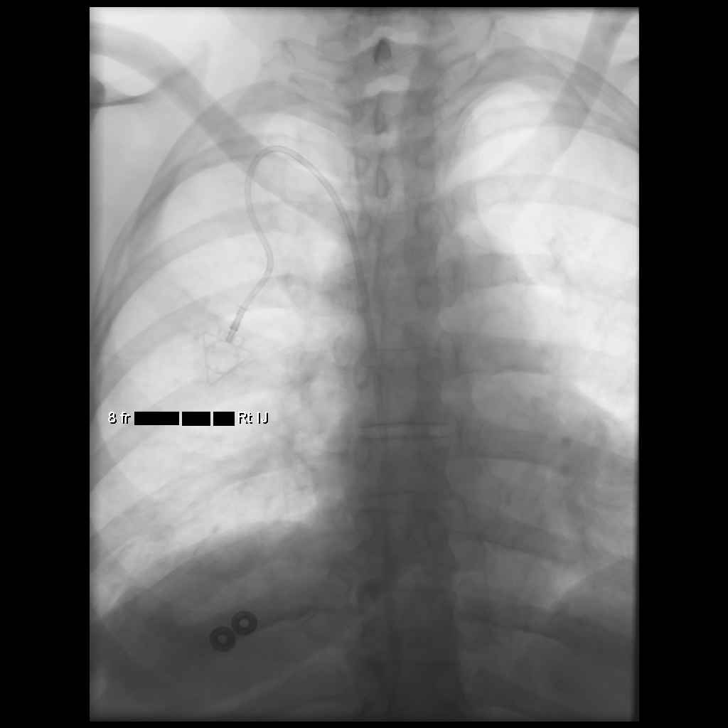
[im 1/2]
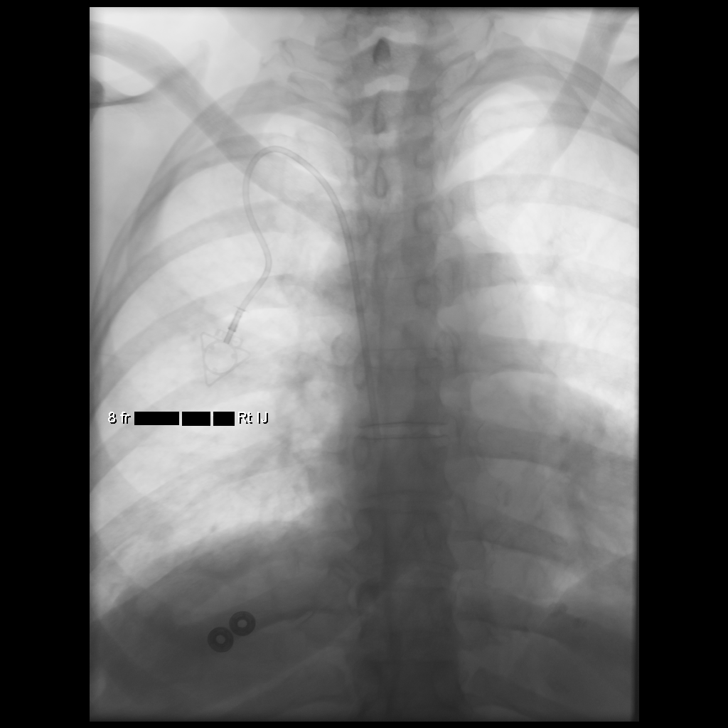
[im 1/2]
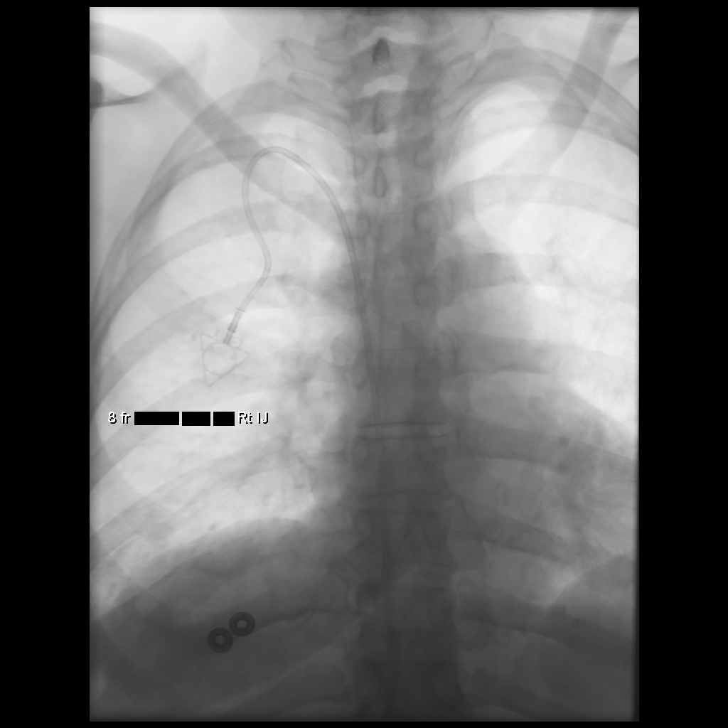
[im 2/2]
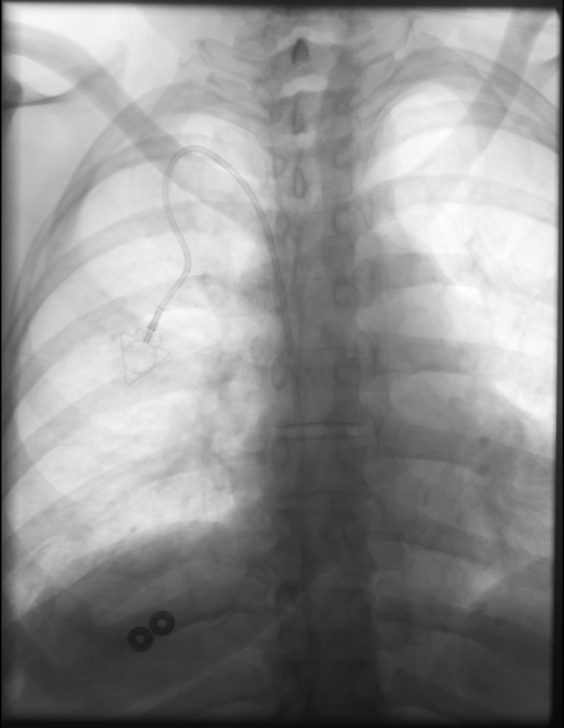
[im 2/2]
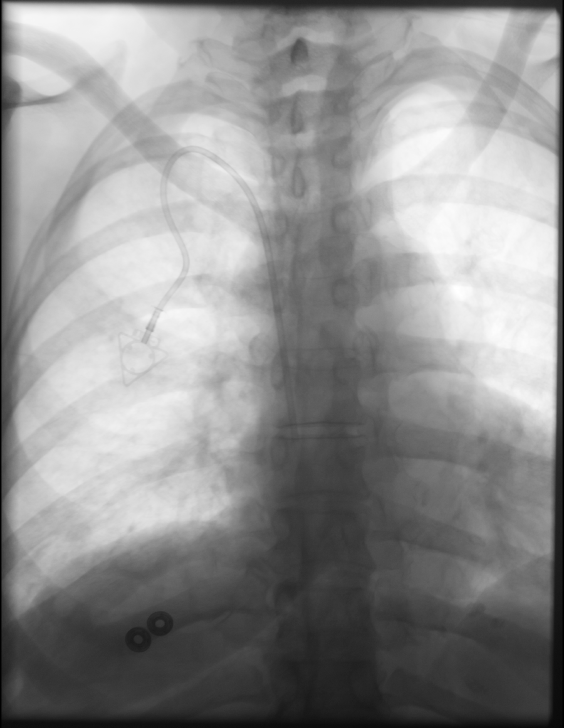
[im 2/2]
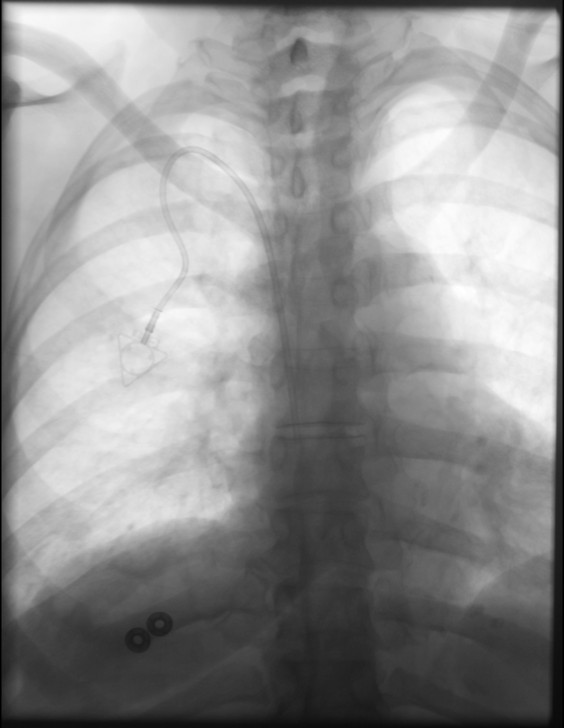

[Series 1: ir (id) (id)/(id)/(id) · 1 of 1 slices shown]
[im 1/1]
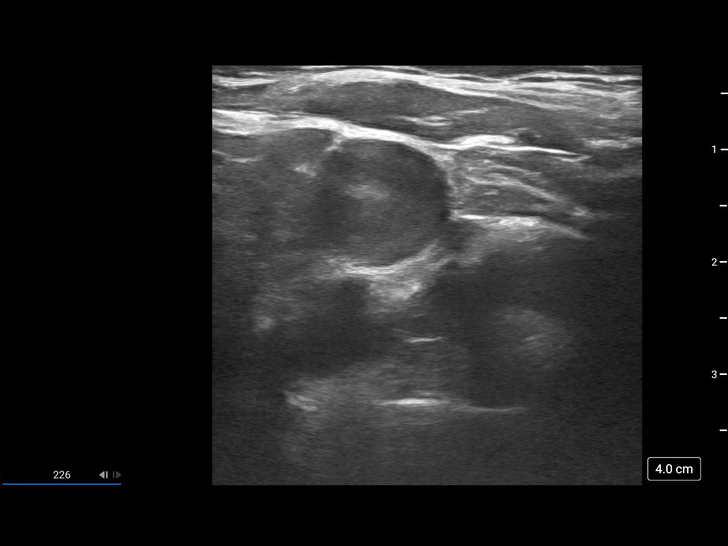

[Series 300: ir imaging guided port insertion · 1 of 1 slices shown]
[im 1/1]
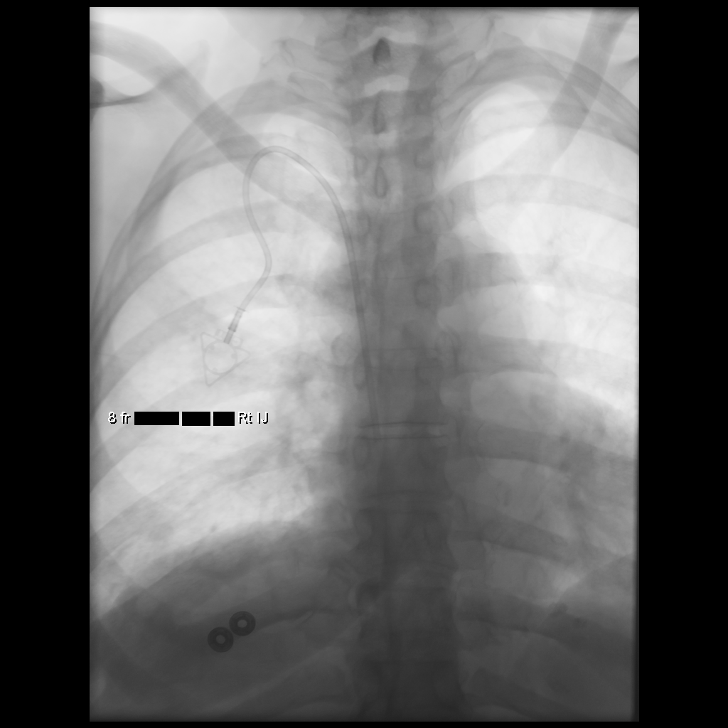

[8 of 8 positions shown; findings below may reference images not displayed]

MEDICATIONS:
None.

ANESTHESIA/SEDATION:
Moderate (conscious) sedation was employed during this procedure. A
total of Versed 2 mg and Fentanyl 100 mcg was administered
intravenously.

Moderate Sedation Time: 14 minutes. The patient's level of
consciousness and vital signs were monitored continuously by
radiology nursing throughout the procedure under my direct
supervision.

CONTRAST:  None

FLUOROSCOPY TIME:  0 minutes, 6 seconds (3 mGy)

COMPLICATIONS:
None immediate.

PROCEDURE:
The procedure, risks, benefits, and alternatives were explained to
the patient. Questions regarding the procedure were encouraged and
answered. The patient understands and consents to the procedure.

The right neck and chest were prepped with chlorhexidine in a
sterile fashion, and a sterile drape was applied covering the
operative field. Maximum barrier sterile technique with sterile
gowns and gloves were used for the procedure. A timeout was
performed prior to the initiation of the procedure.

Ultrasound was used to examine the jugular vein which was
compressible and free of internal echoes. A skin marker was used to
demarcate the planned venotomy and port pocket incision sites. Local
anesthesia was provided to these sites and the subcutaneous tunnel
track with 1% lidocaine with [DATE] epinephrine.

A small incision was created at the jugular access site and blunt
dissection was performed of the subcutaneous tissues. Under
ultrasound guidance, the jugular vein was accessed with a 21 ga
micropuncture needle and an 0.018" wire was inserted to the superior
vena cava. Real-time ultrasound guidance was utilized for vascular
access including the acquisition of a permanent ultrasound image
documenting patency of the accessed vessel. A 5 Fr micopuncture set
was then used, through which a 0.035" Rosen wire was passed under
fluoroscopic guidance into the inferior vena cava. An 8 Fr dilator
was then placed over the wire.

A subcutaneous port pocket was then created along the upper chest
wall utilizing a combination of sharp and blunt dissection. The
pocket was irrigated with sterile saline, packed with gauze, and
observed for hemorrhage. A single lumen "standard sized" power
injectable port was chosen for placement. The 8 Fr catheter was
tunneled from the port pocket site to the venotomy incision. The
port was placed in the pocket. The external catheter was trimmed to
appropriate length. The dilator was exchanged for an 8 Fr peel-away
sheath under fluoroscopic guidance. The catheter was then placed
through the sheath and the sheath was removed. Final catheter
positioning was confirmed and documented with a fluoroscopic spot
radiograph. The port was accessed with MULANGA needle, aspirated,
and flushed with heparinized saline.

The deep dermal layer of the port pocket incision was closed with
interrupted 3-0 Vicryl suture. The skin was opposed with a running
subcuticular 4-0 Monocryl suture. Dermabond was then placed over the
port pocket and neck incisions. The patient tolerated the procedure
well without immediate post procedural complication.
FINDINGS: After catheter placement, the tip lies within the superior
cavoatrial junction. The catheter aspirates and flushes normally and
is ready for immediate use.
IMPRESSION: Successful placement of a power injectable Port-A-Cath via the right
internal jugular vein. The catheter is ready for immediate use.

## 2020-09-22 MED ORDER — LIDOCAINE-EPINEPHRINE 1 %-1:100000 IJ SOLN
INTRAMUSCULAR | Status: AC
  Filled 2020-09-22: qty 1

## 2020-09-22 MED ORDER — MIDAZOLAM HCL 2 MG/2ML IJ SOLN
INTRAMUSCULAR | Status: AC
  Filled 2020-09-22: qty 4

## 2020-09-22 MED ORDER — FENTANYL CITRATE (PF) 100 MCG/2ML IJ SOLN
INTRAMUSCULAR | Status: AC | PRN
  Administered 2020-09-22: 50 ug via INTRAVENOUS
  Administered 2020-09-22 (×2): 25 ug via INTRAVENOUS

## 2020-09-22 MED ORDER — SODIUM CHLORIDE 0.9 % IV SOLN
INTRAVENOUS | Status: AC | PRN
  Administered 2020-09-22: 10 mL/h via INTRAVENOUS

## 2020-09-22 MED ORDER — MIDAZOLAM HCL 2 MG/2ML IJ SOLN
INTRAMUSCULAR | Status: AC | PRN
  Administered 2020-09-22 (×2): 0.5 mg via INTRAVENOUS
  Administered 2020-09-22: 1 mg via INTRAVENOUS

## 2020-09-22 MED ORDER — HEPARIN SOD (PORK) LOCK FLUSH 100 UNIT/ML IV SOLN
INTRAVENOUS | Status: AC
  Filled 2020-09-22: qty 5

## 2020-09-22 MED ORDER — FENTANYL CITRATE (PF) 100 MCG/2ML IJ SOLN
INTRAMUSCULAR | Status: AC
  Filled 2020-09-22: qty 4

## 2020-09-22 MED ORDER — HEPARIN SOD (PORK) LOCK FLUSH 100 UNIT/ML IV SOLN
INTRAVENOUS | Status: AC | PRN
  Administered 2020-09-22: 500 [IU]

## 2020-09-22 NOTE — Procedures (Signed)
Interventional Radiology Procedure Note ° °Procedure: Single Lumen Power Port Placement   ° °Access:  Right internal jugular vein ° °Findings: Catheter tip positioned at cavoatrial junction. Port is ready for immediate use.  ° °Complications: None ° °EBL: < 10 mL ° °Recommendations:  °- Ok to shower in 24 hours °- Do not submerge for 7 days °- Routine line care  ° ° °Ottilie Wigglesworth, MD ° ° ° °

## 2020-09-22 NOTE — Discharge Instructions (Signed)

## 2020-09-23 ENCOUNTER — Telehealth (HOSPITAL_COMMUNITY): Payer: Self-pay | Admitting: Student

## 2020-09-23 ENCOUNTER — Telehealth: Payer: Self-pay | Admitting: Student

## 2020-09-23 NOTE — Telephone Encounter (Signed)
Received a call back from [facility].   She is wondering when she can take bandages off that are covering her port placement site.  Inform Christy [name] that keep the bandage on for one more day but she should not need bandage after that.  Instructed to keep the incision site dry and clean as much as possible, and the super glue (Dermabond) on her skin should be enough to keep her incision site clean and dry from tomorrow.   Christy Piontek verbalized understanding.  Informed Christy Sol to contact IR for further concern or questions.   Armando Gang Churchill Grimsley PA-C 09/23/2020 2:36 PM

## 2020-09-23 NOTE — Telephone Encounter (Signed)
Informed by team that Christy [name] called due to "port feeling tight, wondering if I need to remove dressing."  Called Christy Kennerly, call went straight to VM.  Left a VM with call back number.    Armando Gang Gurman Ashland PA-C 09/23/2020 1:18 PM

## 2020-09-24 ENCOUNTER — Other Ambulatory Visit (HOSPITAL_COMMUNITY): Payer: Self-pay | Admitting: Interventional Radiology

## 2020-09-24 ENCOUNTER — Telehealth (HOSPITAL_COMMUNITY): Payer: Self-pay | Admitting: Student

## 2020-09-24 DIAGNOSIS — C50011 Malignant neoplasm of nipple and areola, right female breast: Secondary | ICD-10-CM

## 2020-09-24 NOTE — Telephone Encounter (Signed)
Christy [name] called today concerning dressing changes.  She is concerned that the [facility] site may be bloody underneath her dressing. She is anxious and would benefit from site check.  Have requested scheduler set her up for brief appointment tomorrow for site check and dressing change.  Her next appointment with the cancer center is not until 7/29.   Brynda Greathouse, MS RD PA-C

## 2020-09-25 ENCOUNTER — Ambulatory Visit (HOSPITAL_COMMUNITY)
Admission: RE | Admit: 2020-09-25 | Discharge: 2020-09-25 | Disposition: A | Discharge status: Home / Self Care | Payer: Medicare Other | Source: Ambulatory Visit | Attending: Interventional Radiology | Admitting: Interventional Radiology

## 2020-09-25 ENCOUNTER — Other Ambulatory Visit: Payer: Self-pay

## 2020-09-25 DIAGNOSIS — C50011 Malignant neoplasm of nipple and areola, right female breast: Secondary | ICD-10-CM

## 2020-09-25 HISTORY — PX: IR RADIOLOGIST EVAL & MGMT: IMG5224

## 2020-09-25 NOTE — Progress Notes (Signed)
Mrs. Eckman presents to [facility] today for site check after Port-A-Cath placement 7/19.   Patient anxious regarding site care and has called office for assistance.  Upon assessment today, her site is intact, clean, and dry.  Dressings removed without issue.  There is tenderness over the tunneled tract, not unexpected post-procedure.   Incision appears to be healing well.  Dermabond remains intact over puncture site as well as incision.  No bleeding, oozing, skin breakdown, erythema, warmth, or other signs of infection or hematoma.   Provided reassurance and changed dressing.  Patient may call with further questions, however care instructions provided.  Her next appointment with cancer center is next week.   Brynda Greathouse, MS RD PA-C

## 2020-10-01 ENCOUNTER — Encounter: Payer: Self-pay | Admitting: Hematology

## 2020-10-01 NOTE — Progress Notes (Signed)
Called pt to introduce myself as her Arboriculturist.  Unfortunately there aren't any foundations offering copay assistance for her Dx and the type of ins she has.  I informed her of the [employer], went over what it covers and gave her the income requirement.  She would like to apply so she will bring her proof of income on 10/02/20.  If approved I will give her an expense sheet and my card for any questions or concerns she may have in the future.

## 2020-10-02 ENCOUNTER — Encounter: Payer: Self-pay | Admitting: Hematology

## 2020-10-02 ENCOUNTER — Inpatient Hospital Stay (HOSPITAL_BASED_OUTPATIENT_CLINIC_OR_DEPARTMENT_OTHER): Payer: Medicare Other | Admitting: Hematology

## 2020-10-02 ENCOUNTER — Inpatient Hospital Stay: Payer: Medicare Other

## 2020-10-02 ENCOUNTER — Other Ambulatory Visit: Payer: Self-pay

## 2020-10-02 VITALS — BP 157/68 | HR 66 | Temp 98.0°F | Resp 17 | Wt 181.5 lb

## 2020-10-02 VITALS — BP 161/64 | HR 63 | Temp 97.6°F | Resp 18

## 2020-10-02 DIAGNOSIS — Z171 Estrogen receptor negative status [ER-]: Secondary | ICD-10-CM

## 2020-10-02 DIAGNOSIS — C50412 Malignant neoplasm of upper-outer quadrant of left female breast: Secondary | ICD-10-CM

## 2020-10-02 LAB — CMP (CANCER CENTER ONLY)
ALT: 21 U/L (ref 0–44)
AST: 24 U/L (ref 15–41)
Albumin: 3.4 g/dL — ABNORMAL LOW (ref 3.5–5.0)
Alkaline Phosphatase: 74 U/L (ref 38–126)
Anion gap: 9 (ref 5–15)
BUN: 19 mg/dL (ref 8–23)
CO2: 27 mmol/L (ref 22–32)
Calcium: 10.6 mg/dL — ABNORMAL HIGH (ref 8.9–10.3)
Chloride: 108 mmol/L (ref 98–111)
Creatinine: 1.78 mg/dL — ABNORMAL HIGH (ref 0.44–1.00)
GFR, Estimated: 29 mL/min — ABNORMAL LOW (ref >60)
Glucose, Bld: 107 mg/dL — ABNORMAL HIGH (ref 70–99)
Potassium: 4.2 mmol/L (ref 3.5–5.1)
Sodium: 144 mmol/L (ref 135–145)
Total Bilirubin: 0.4 mg/dL (ref 0.3–1.2)
Total Protein: 7 g/dL (ref 6.5–8.1)

## 2020-10-02 LAB — CBC WITH DIFFERENTIAL (CANCER CENTER ONLY)
Abs Immature Granulocytes: 0 10*3/uL (ref 0.00–0.07)
Basophils Absolute: 0.1 10*3/uL (ref 0.0–0.1)
Basophils Relative: 1 %
Eosinophils Absolute: 0.2 10*3/uL (ref 0.0–0.5)
Eosinophils Relative: 4 %
HCT: 33.8 % — ABNORMAL LOW (ref 36.0–46.0)
Hemoglobin: 11.1 g/dL — ABNORMAL LOW (ref 12.0–15.0)
Immature Granulocytes: 0 %
Lymphocytes Relative: 36 %
Lymphs Abs: 1.9 10*3/uL (ref 0.7–4.0)
MCH: 24 pg — ABNORMAL LOW (ref 26.0–34.0)
MCHC: 32.8 g/dL (ref 30.0–36.0)
MCV: 73 fL — ABNORMAL LOW (ref 80.0–100.0)
Monocytes Absolute: 0.4 10*3/uL (ref 0.1–1.0)
Monocytes Relative: 7 %
Neutro Abs: 2.8 10*3/uL (ref 1.7–7.7)
Neutrophils Relative %: 52 %
Platelet Count: 321 10*3/uL (ref 150–400)
RBC: 4.63 MIL/uL (ref 3.87–5.11)
RDW: 15.4 % (ref 11.5–15.5)
WBC Count: 5.3 10*3/uL (ref 4.0–10.5)
nRBC: 0 % (ref 0.0–0.2)

## 2020-10-02 MED ORDER — SODIUM CHLORIDE 0.9% FLUSH
10.0000 mL | INTRAVENOUS | Status: DC | PRN
  Administered 2020-10-02: 10 mL via INTRAVENOUS
  Filled 2020-10-02: qty 10

## 2020-10-02 MED ORDER — SODIUM CHLORIDE 0.9% FLUSH
10.0000 mL | INTRAVENOUS | Status: DC | PRN
  Filled 2020-10-02: qty 10

## 2020-10-02 MED ORDER — SODIUM CHLORIDE 0.9 % IV SOLN
Freq: Once | INTRAVENOUS | Status: AC
  Filled 2020-10-02: qty 250

## 2020-10-02 MED ORDER — ACETAMINOPHEN 325 MG PO TABS
650.0000 mg | ORAL_TABLET | Freq: Once | ORAL | Status: AC
Start: 2020-10-02 — End: 2020-10-02
  Administered 2020-10-02: 650 mg via ORAL

## 2020-10-02 MED ORDER — HEPARIN SOD (PORK) LOCK FLUSH 100 UNIT/ML IV SOLN
500.0000 [IU] | Freq: Once | INTRAVENOUS | Status: DC | PRN
  Filled 2020-10-02: qty 5

## 2020-10-02 MED ORDER — DIPHENHYDRAMINE HCL 25 MG PO CAPS
50.0000 mg | ORAL_CAPSULE | Freq: Once | ORAL | Status: AC
  Administered 2020-10-02: 50 mg via ORAL

## 2020-10-02 MED ORDER — DIPHENHYDRAMINE HCL 25 MG PO CAPS
ORAL_CAPSULE | ORAL | Status: AC
  Filled 2020-10-02: qty 2

## 2020-10-02 MED ORDER — ACETAMINOPHEN 325 MG PO TABS
ORAL_TABLET | ORAL | Status: AC
  Filled 2020-10-02: qty 2

## 2020-10-02 MED ORDER — SODIUM CHLORIDE 0.9 % IV SOLN
3.6000 mg/kg | Freq: Once | INTRAVENOUS | Status: AC
  Administered 2020-10-02: 300 mg via INTRAVENOUS
  Filled 2020-10-02: qty 15

## 2020-10-02 NOTE — Progress Notes (Signed)
Patient was observed for 30 minutes post infusion with no adverse reaction.

## 2020-10-02 NOTE — Progress Notes (Signed)
[place]   Telephone:(336) [phone] Fax:(336) [phone]   Clinic Follow up Note   Patient Care Team: Nolene Ebbs, MD as PCP - General (Internal Medicine) Mauro Kaufmann, RN as Oncology Nurse Navigator Rockwell Germany, RN as Oncology Nurse Navigator  Date of Service:  10/02/2020  CHIEF COMPLAINT: f/u of metastatic breast cancer  SUMMARY OF ONCOLOGIC HISTORY: Oncology History Overview Note  Cancer Staging Malignant neoplasm of upper-outer quadrant of female breast [facility]) Staging form: Breast, AJCC 8th Edition - Clinical stage from 07/31/2020: Stage IV (cT2, cN3, cM1, G2, ER-, PR-, HER2+) - Signed by Alla Feeling, NP on 08/31/2020 Stage prefix: Initial diagnosis Nuclear grade: G2 Histologic grading system: 3 grade system    Malignant neoplasm of upper-outer quadrant of female breast ([place])  07/24/2020 Breast US   On physical exam, a very firm mass is identified at the 1:30 position of the LEFT breast 5 cm from the nipple.   IMPRESSION: 1. Highly suspicious 4.3 cm mass at the 1:30 position of the LEFT breast and highly suspicious 2.7 cm mass at the 2 o'clock position of the LEFT breast, with the 2 masses encompassing an area measuring at least 6.1 cm. 2. 3 abnormal LEFT axillary lymph nodes with cortical thickening, tissue sampling of 1 of these lymph nodes recommended. 3. Anterior LEFT breast skin thickening nonspecific but may reflect dermal involvement. 4. No mammographic evidence of RIGHT breast malignancy.   07/31/2020 Initial Biopsy   Diagnosis 1. Breast, left, needle core biopsy, 1 :30 pm, 5cmfn - INVASIVE DUCTAL CARCINOMA, SEE COMMENT. 2. Breast, left, needle core biopsy, 2 o'clock, 8cmfn - INVASIVE DUCTAL CARCINOMA, SEE COMMENT. - DUCTAL CARCINOMA IN SITU. 3. Lymph node, needle/core biopsy, left axillary - METASTATIC CARCINOMA IN A LYMPH NODE. Microscopic Comment 1. and 3. The carcinoma in both parts has a similar morphology and appears grade  2-3. The DCIS in part 2 is high-grade with necrosis. The carcinoma measures 15 mm (part 1) and 14 mm (part 2) in greatest linear extent  2. PROGNOSTIC INDICATORS Results: IMMUNOHISTOCHEMICAL AND MORPHOMETRIC ANALYSIS PERFORMED MANUALLY The tumor cells are POSITIVE for Her2 (3+). Estrogen Receptor: 0%, NEGATIVE Progesterone Receptor: 0%, NEGATIVE Proliferation Marker Ki67: 20%    07/31/2020 Cancer Staging   Staging form: Breast, AJCC 8th Edition - Clinical stage from 07/31/2020: Stage IV (cT2, cN3, cM1, G2, ER-, PR-, HER2+) - Signed by Alla Feeling, NP on 08/31/2020 Stage prefix: Initial diagnosis Nuclear grade: G2 Histologic grading system: 3 grade system   08/13/2020 Initial Diagnosis   Malignant neoplasm of upper-outer quadrant of female breast (Christy [name])   08/28/2020 PET scan   IMPRESSION: Signs of LEFT breast cancer with LEFT axillary and retropectoral adenopathy as well as LEFT supraclavicular adenopathy.   Signs of pulmonary metastatic disease.   Mild asymmetric uptake in the LEFT as compared to the RIGHT adrenal gland. Equivocal but suspicious, attention on follow-up.   Aortic atherosclerosis.   Cystic changes and potential bronchiectasis in the RIGHT lung base likely post infectious or inflammatory. Correlate with any respiratory symptoms. Comparison with prior imaging may be helpful with attention on follow-up.   08/28/2020 Imaging   BRAIN MRI IMPRESSION: 1. Subacute lacunar infarct of the left basal ganglia with no malignant hemorrhagic transformation or mass effect. Underlying chronic bilateral basal ganglia ischemia. 2. No metastatic disease or other acute intracranial abnormality identified.   08/28/2020 Imaging   Breast MRI   IMPRESSION: 1. 8.1 x 8.1 x 5.1 cm area of biopsy-proven malignancy in  the central, upper outer and lower outer quadrants of the left breast. 2. Diffuse anterior left breast skin thickening without abnormal enhancement. This may represent  thickening due to lymphedema associated with lymphatic obstruction. 3. Metastatic level 1 and level 2 left axillary adenopathy. 4. No evidence of malignancy on the right.   09/11/2020 -  Chemotherapy    Patient is on Treatment Plan: BREAST ADO-TRASTUZUMAB EMTANSINE (KADCYLA) Q21D         CURRENT THERAPY:  Kadcyla every 3 weeks, started on 09/11/2020  INTERVAL HISTORY:  ADIANNA [name] is here for a follow up of metastatic breast cancer. She was last seen by me on 09/21/20. She presents to the clinic with her daughter.  She reports fatigue for a few days following treatment. She reports her appetite has "slowed down some." She denies nausea and pain. She was not aware she would have to return every 3 weeks for treatment.   All other systems were reviewed with the patient and are negative.  MEDICAL HISTORY:  Past Medical History:  Diagnosis Date   Diabetes mellitus without complication (Christy [name])    Hyperlipidemia    Hypertension     SURGICAL HISTORY: Past Surgical History:  Procedure Laterality Date   FOOT FRACTURE SURGERY Right    IR IMAGING GUIDED PORT INSERTION  09/22/2020    I have reviewed the social history and family history with the patient and they are unchanged from previous note.  ALLERGIES:  has No Known Allergies.  MEDICATIONS:  Current Outpatient Medications  Medication Sig Dispense Refill   amLODipine (NORVASC) 5 MG tablet  (Patient not taking: Reported on 09/17/2020)     aspirin EC 325 MG tablet Take 1 tablet (325 mg total) by mouth daily. 30 tablet 5   atorvastatin (LIPITOR) 20 MG tablet Take 1 tablet (20 mg total) by mouth daily. 30 tablet 5   cetirizine (ZYRTEC ALLERGY) 10 MG tablet Take 1 tablet (10 mg total) by mouth daily. (Patient not taking: Reported on 09/17/2020) 30 tablet 0   clotrimazole (CLOTRIMAZOLE AF) 1 % cream Apply 1 application topically 2 (two) times daily. 30 g 0   fluticasone (FLONASE) 50 MCG/ACT nasal spray Place 1 spray into both nostrils  daily. (Patient not taking: Reported on 09/17/2020) 1 g 0   metFORMIN (GLUCOPHAGE) 500 MG tablet Take 1 tablet (500 mg total) by mouth 2 (two) times daily with a meal. 60 tablet 1   olmesartan (BENICAR) 40 MG tablet      No current facility-administered medications for this visit.   Facility-Administered Medications Ordered in Other Visits  Medication Dose Route Frequency Provider Last Rate Last Admin   sodium chloride flush (NS) 0.9 % injection 10 mL  10 mL Intravenous PRN Truitt Merle, MD   10 mL at 10/02/20 1132    PHYSICAL EXAMINATION: ECOG PERFORMANCE STATUS: 1 - Symptomatic but completely ambulatory  There were no vitals filed for this visit.  There were no vitals filed for this visit.   GENERAL:alert, no distress and comfortable SKIN: skin color, texture, turgor are normal, no rashes or significant lesions EYES: normal, Conjunctiva are pink and non-injected, sclera clear Musculoskeletal:no cyanosis of digits and no clubbing  NEURO: alert & oriented x 3 with fluent speech, no focal motor/sensory deficits BREAST: 4.5 x 2.5 cm index mass in left breast, measures about 6 cm with nearby satellite mass.  LABORATORY DATA:  I have reviewed the data as listed CBC Latest Ref Rng & Units 10/02/2020 09/21/2020 09/11/2020  WBC 4.0 -  10.5 K/uL 5.3 6.5 4.8  Hemoglobin 12.0 - 15.0 g/dL 11.1(L) 11.5(L) 11.8(L)  Hematocrit 36.0 - 46.0 % 33.8(L) 34.3(L) 35.1(L)  Platelets 150 - 400 K/uL 321 161 304     CMP Latest Ref Rng & Units 10/02/2020 09/21/2020 09/11/2020  Glucose 70 - 99 mg/dL 107(H) 120(H) 127(H)  BUN 8 - 23 mg/dL 19 19 22   Creatinine 0.44 - 1.00 mg/dL 1.78(H) 2.05(H) 1.82(H)  Sodium 135 - 145 mmol/L 144 144 143  Potassium 3.5 - 5.1 mmol/L 4.2 3.8 4.1  Chloride 98 - 111 mmol/L 108 108 109  CO2 22 - 32 mmol/L 27 23 24   Calcium 8.9 - 10.3 mg/dL 10.6(H) 10.6(H) 10.5(H)  Total Protein 6.5 - 8.1 g/dL 7.0 7.5 7.5  Total Bilirubin 0.3 - 1.2 mg/dL 0.4 0.3 0.4  Alkaline Phos 38 - 126 U/L 74 77  68  AST 15 - 41 U/L 24 49(H) 14(L)  ALT 0 - 44 U/L 21 28 9       RADIOGRAPHIC STUDIES: I have personally reviewed the radiological images as listed and agreed with the findings in the report. No results found.   ASSESSMENT & PLAN:  Christy [name] is a 80 y.o. female with   1.Malignant neoplasm of overlapping sites in the central and upper out quadrant left breast, invasive ductal carcinoma, cT2N3M1 stage IV, grade 2-3, ER/PR negative HER2 positive, MIB-1 of 20% - with nodal and pulmonary metastases -She initially self palpated a left breast mass in 06/2020, possibly earlier.  Work-up showed 2 masses in the left upper outer breast and 3 abnormal lymph nodes.  Biopsy of the 2 masses and 1 lymph node all showed invasive ductal carcinoma, grade 2-3, ER/PR negative and HER2 positive. -PET scan 08/28/20 shows the hypermetabolic known left breast malignancy with hypermetabolic left axillary, retropectoral, and supraclavicular adenopathy.  Additionally there are several small 3-6 mm pulmonary nodules with mild hypermetabolic activity, felt to be consistent with pulmonary metastasis.  -baseline labs which shows CKD and mild anemia, CA 27.29 is normal. Baseline echo EF 55-60% -She was started on first-line systemic Kadcyla, given once every 3 weeks, on 09/11/20. -She tolerated first dose well, with some fatigue, no other noticeable side effects -Lab reviewed, adequate for treatment, will proceed second cycle Kadcyla today with full dose.  She has stage III CKD, if she does not tolerate full dose, will reduce her dose back again.  2.  Genetics -Her sister had metastatic breast cancer in her 87s, and that sisters daughter possibly have breast cancer. -Due to family history and ER/PR negative disease, patient qualifies for genetics.  -Patient's daughter understands her breast cancer risk and the importance of annual mammography.   3.Spinal stenosis -Reportedly diagnosed in [place], [place].  Patient has  chronic back pain and left leg weakness at baseline -Recently she developed right leg weakness and frequent fall -PET is negative for spinal mets and brain MRI shows subacute lacunar infarct without metastatic disease    4. HTN, DM -on metformin, amlodipine, and olmesartan -f/up PCP Dr. Jeanie Cooks -Denies baseline neuropathy -She asked about soda, orange juice, and grape juice. I advised her to limit her sugar intake, maybe try diet soda, but this still contains sodium.   5.  Subacute stroke -Her brain MRI showed subacute lacunar infarct of the left basal ganglia, she has no clinical symptoms of stroke. -She was seen by Dr. Mickeal Skinner on 09/17/20. He recommended cholesterol medicine and full-dose aspirin. She was prescribed lipitor at that time.    PLAN: -  proceed with C2 Kadcyla with full dose (discussed with pharmacy)  -labs, flush, f/u, and C3 Kadcyla in 3 weeks   No problem-specific Assessment & Plan notes found for this encounter.   No orders of the defined types were placed in this encounter.  All questions were answered. The patient knows to call the clinic with any problems, questions or concerns. No barriers to learning was detected. The total time spent in the appointment was 30 minutes.     Truitt Merle, MD 10/02/2020   I, Wilburn Mylar, am acting as scribe for Truitt Merle, MD.   I have reviewed the above documentation for accuracy and completeness, and I agree with the above.

## 2020-10-02 NOTE — Progress Notes (Signed)
Pt is approved for the $1000 [employer] grant.  

## 2020-10-02 NOTE — Progress Notes (Signed)
Confirmed dose of Kadcyla to be 3.6 mg/kg for today's dose.  T.O. Dr Lavonda Jumbo, PharmD

## 2020-10-02 NOTE — Patient Instructions (Signed)
[facility]  Discharge Instructions: Thank you for choosing [facility] to provide your oncology and hematology care.   If you have a lab appointment with the [facility], please go directly to the [facility] and check in at the registration area.   Wear comfortable clothing and clothing appropriate for easy access to any Portacath or PICC line.   We strive to give you quality time with your provider. You may need to reschedule your appointment if you arrive late (15 or more minutes).  Arriving late affects you and other patients whose appointments are after yours.  Also, if you miss three or more appointments without notifying the office, you may be dismissed from the clinic at the provider's discretion.      For prescription refill requests, have your pharmacy contact our office and allow 72 hours for refills to be completed.    Today you received the following chemotherapy and/or immunotherapy agents: Kadcyla.      To help prevent nausea and vomiting after your treatment, we encourage you to take your nausea medication as directed.  BELOW ARE SYMPTOMS THAT SHOULD BE REPORTED IMMEDIATELY: *FEVER GREATER THAN 100.4 F (38 C) OR HIGHER *CHILLS OR SWEATING *NAUSEA AND VOMITING THAT IS NOT CONTROLLED WITH YOUR NAUSEA MEDICATION *UNUSUAL SHORTNESS OF BREATH *UNUSUAL BRUISING OR BLEEDING *URINARY PROBLEMS (pain or burning when urinating, or frequent urination) *BOWEL PROBLEMS (unusual diarrhea, constipation, pain near the anus) TENDERNESS IN MOUTH AND THROAT WITH OR WITHOUT PRESENCE OF ULCERS (sore throat, sores in mouth, or a toothache) UNUSUAL RASH, SWELLING OR PAIN  UNUSUAL VAGINAL DISCHARGE OR ITCHING   Items with * indicate a potential emergency and should be followed up as soon as possible or go to the Emergency Department if any problems should occur.  Please show the CHEMOTHERAPY ALERT CARD or IMMUNOTHERAPY ALERT CARD at check-in to  the Emergency Department and triage nurse.  Should you have questions after your visit or need to cancel or reschedule your appointment, please contact [facility]  Dept: [phone]  and follow the prompts.  Office hours are 8:00 a.m. to 4:30 p.m. Monday - Friday. Please note that voicemails left after 4:00 p.m. may not be returned until the following business day.  We are closed weekends and major holidays. You have access to a nurse at all times for urgent questions. Please call the main number to the clinic Dept: [phone] and follow the prompts.   For any non-urgent questions, you may also contact your provider using MyChart. We now offer e-Visits for anyone 73 and older to request care online for non-urgent symptoms. For details visit [email or web address].   Also download the MyChart app! Go to the app store, search "MyChart", open the app, select [facility], and log in with your MyChart username and password.  Due to Covid, a mask is required upon entering the hospital/clinic. If you do not have a mask, one will be given to you upon arrival. For doctor visits, patients may have 1 support person aged 67 or older with them. For treatment visits, patients cannot have anyone with them due to current Covid guidelines and our immunocompromised population.   Ado-Trastuzumab Emtansine for injection What is this medication? ADO-TRASTUZUMAB EMTANSINE (ADD oh traz TOO zuh mab em TAN zine) is a monoclonalantibody combined with chemotherapy. It is used to treat breast cancer. This medicine may be used for other purposes; ask your health care provider orpharmacist if you have  questions. COMMON BRAND NAME(S): Kadcyla What should I tell my care team before I take this medication? They need to know if you have any of these conditions: heart disease heart failure infection (especially a virus infection such as chickenpox, cold sores, or herpes) liver disease lung or  breathing disease, like asthma tingling of the fingers or toes, or other nerve disorder an unusual or allergic reaction to ado-trastuzumab emtansine, other medications, foods, dyes, or preservatives pregnant or trying to get pregnant breast-feeding How should I use this medication? This medicine is for infusion into a vein. It is given by a health careprofessional in a hospital or clinic setting. Talk to your pediatrician regarding the use of this medicine in children.Special care may be needed. Overdosage: If you think you have taken too much of this medicine contact apoison control center or emergency room at once. NOTE: This medicine is only for you. Do not share this medicine with others. What if I miss a dose? It is important not to miss your dose. Call your doctor or health careprofessional if you are unable to keep an appointment. What may interact with this medication? This medicine may also interact with the following medications: atazanavir boceprevir clarithromycin delavirdine indinavir dalfopristin; quinupristin isoniazid, INH itraconazole ketoconazole nefazodone nelfinavir ritonavir telaprevir telithromycin tipranavir voriconazole This list may not describe all possible interactions. Give your health care provider a list of all the medicines, herbs, non-prescription drugs, or dietary supplements you use. Also tell them if you smoke, drink alcohol, or use illegaldrugs. Some items may interact with your medicine. What should I watch for while using this medication? Visit your doctor for checks on your progress. This drug may make you feel generally unwell. This is not uncommon, as chemotherapy can affect healthy cells as well as cancer cells. Report any side effects. Continue your course oftreatment even though you feel ill unless your doctor tells you to stop. You may need blood work done while you are taking this medicine. Call your doctor or health care professional  for advice if you get a fever, chills or sore throat, or other symptoms of a cold or flu. Do not treat yourself. This drug decreases your body's ability to fight infections. Try toavoid being around people who are sick. Be careful brushing and flossing your teeth or using a toothpick because you may get an infection or bleed more easily. If you have any dental work done,tell your dentist you are receiving this medicine. Avoid taking products that contain aspirin, acetaminophen, ibuprofen, naproxen, or ketoprofen unless instructed by your doctor. These medicines may hide afever. Do not become pregnant while taking this medicine or for 7 months after stopping it, men with female partners should use contraception during treatment and for 4 months after the last dose. Women should inform their doctor if they wish to become pregnant or think they might be pregnant. There is a potential for serious side effects to an unborn child. Do not breast-feed an infant whiletaking this medicine or for 7 months after the last dose. Men who have a partner who is pregnant or who is capable of becoming pregnant should use a condom during sexual activity while taking this medicine and for 4 months after stopping it. Men should inform their doctors if they wish to father a child. This medicine may lower sperm counts. Talk to your health careprofessional or pharmacist for more information. What side effects may I notice from receiving this medication? Side effects that you should report to your  doctor or health care professionalas soon as possible: allergic reactions like skin rash, itching or hives, swelling of the face, lips, or tongue breathing problems chest pain or palpitations fever or chills, sore throat general ill feeling or flu-like symptoms light-colored stools nausea, vomiting pain, tingling, numbness in the hands or feet signs and symptoms of bleeding such as bloody or black, tarry stools; red or dark-brown  urine; spitting up blood or brown material that looks like coffee grounds; red spots on the skin; unusual bruising or bleeding from the eye, gums, or nose swelling of the legs or ankles yellowing of the eyes or skin Side effects that usually do not require medical attention (report to yourdoctor or health care professional if they continue or are bothersome): changes in taste constipation dizziness headache joint pain muscle pain trouble sleeping unusually weak or tired This list may not describe all possible side effects. Call your doctor for medical advice about side effects. You may report side effects to FDA at1-800-FDA-1088. Where should I keep my medication? This drug is given in a hospital or clinic and will not be stored at home. NOTE: This sheet is a summary. It may not cover all possible information. If you have questions about this medicine, talk to your doctor, pharmacist, orhealth care provider.  2022 Elsevier/Gold Standard (2017-07-21 10:03:15)

## 2020-10-04 ENCOUNTER — Encounter: Payer: Self-pay | Admitting: Hematology

## 2020-10-05 ENCOUNTER — Telehealth: Payer: Self-pay | Admitting: Hematology

## 2020-10-05 NOTE — Telephone Encounter (Signed)
Scheduled follow-up appointment per 7/29 los. Patient's daughter is aware.

## 2020-10-15 ENCOUNTER — Telehealth: Payer: Self-pay

## 2020-10-15 NOTE — Telephone Encounter (Signed)
This patient called and left a message stating that she is having difficulty walking.  She is not sure what is causing it and would like your opinion.  She also states that she was recently diagnosed with spinal stenosis.  This nurse will inform MD.

## 2020-10-23 ENCOUNTER — Inpatient Hospital Stay: Payer: Medicare Other | Attending: Nurse Practitioner

## 2020-10-23 ENCOUNTER — Telehealth: Payer: Self-pay

## 2020-10-23 ENCOUNTER — Inpatient Hospital Stay: Payer: Medicare Other

## 2020-10-23 ENCOUNTER — Inpatient Hospital Stay (HOSPITAL_BASED_OUTPATIENT_CLINIC_OR_DEPARTMENT_OTHER): Payer: Medicare Other | Admitting: Hematology

## 2020-10-23 ENCOUNTER — Other Ambulatory Visit: Payer: Self-pay

## 2020-10-23 ENCOUNTER — Encounter: Payer: Self-pay | Admitting: Hematology

## 2020-10-23 VITALS — BP 158/67 | HR 60 | Temp 98.6°F | Resp 18 | Wt 183.1 lb

## 2020-10-23 DIAGNOSIS — Z171 Estrogen receptor negative status [ER-]: Secondary | ICD-10-CM | POA: Diagnosis not present

## 2020-10-23 DIAGNOSIS — G8929 Other chronic pain: Secondary | ICD-10-CM | POA: Diagnosis not present

## 2020-10-23 DIAGNOSIS — I6782 Cerebral ischemia: Secondary | ICD-10-CM | POA: Diagnosis not present

## 2020-10-23 DIAGNOSIS — Z5112 Encounter for antineoplastic immunotherapy: Secondary | ICD-10-CM | POA: Insufficient documentation

## 2020-10-23 DIAGNOSIS — Z79899 Other long term (current) drug therapy: Secondary | ICD-10-CM | POA: Insufficient documentation

## 2020-10-23 DIAGNOSIS — Z803 Family history of malignant neoplasm of breast: Secondary | ICD-10-CM | POA: Diagnosis not present

## 2020-10-23 DIAGNOSIS — C50412 Malignant neoplasm of upper-outer quadrant of left female breast: Secondary | ICD-10-CM | POA: Diagnosis not present

## 2020-10-23 DIAGNOSIS — E1122 Type 2 diabetes mellitus with diabetic chronic kidney disease: Secondary | ICD-10-CM | POA: Diagnosis not present

## 2020-10-23 DIAGNOSIS — R262 Difficulty in walking, not elsewhere classified: Secondary | ICD-10-CM | POA: Diagnosis not present

## 2020-10-23 DIAGNOSIS — M549 Dorsalgia, unspecified: Secondary | ICD-10-CM | POA: Diagnosis not present

## 2020-10-23 DIAGNOSIS — I129 Hypertensive chronic kidney disease with stage 1 through stage 4 chronic kidney disease, or unspecified chronic kidney disease: Secondary | ICD-10-CM | POA: Insufficient documentation

## 2020-10-23 DIAGNOSIS — Z95828 Presence of other vascular implants and grafts: Secondary | ICD-10-CM

## 2020-10-23 DIAGNOSIS — C7800 Secondary malignant neoplasm of unspecified lung: Secondary | ICD-10-CM | POA: Insufficient documentation

## 2020-10-23 DIAGNOSIS — I700 Atherosclerosis of aorta: Secondary | ICD-10-CM | POA: Diagnosis not present

## 2020-10-23 DIAGNOSIS — I6381 Other cerebral infarction due to occlusion or stenosis of small artery: Secondary | ICD-10-CM | POA: Insufficient documentation

## 2020-10-23 DIAGNOSIS — R5383 Other fatigue: Secondary | ICD-10-CM | POA: Diagnosis not present

## 2020-10-23 DIAGNOSIS — M4800 Spinal stenosis, site unspecified: Secondary | ICD-10-CM | POA: Insufficient documentation

## 2020-10-23 DIAGNOSIS — N183 Chronic kidney disease, stage 3 unspecified: Secondary | ICD-10-CM | POA: Insufficient documentation

## 2020-10-23 LAB — CBC WITH DIFFERENTIAL (CANCER CENTER ONLY)
Abs Immature Granulocytes: 0.01 10*3/uL (ref 0.00–0.07)
Basophils Absolute: 0 10*3/uL (ref 0.0–0.1)
Basophils Relative: 1 %
Eosinophils Absolute: 0.2 10*3/uL (ref 0.0–0.5)
Eosinophils Relative: 4 %
HCT: 32.1 % — ABNORMAL LOW (ref 36.0–46.0)
Hemoglobin: 10.5 g/dL — ABNORMAL LOW (ref 12.0–15.0)
Immature Granulocytes: 0 %
Lymphocytes Relative: 31 %
Lymphs Abs: 1.8 10*3/uL (ref 0.7–4.0)
MCH: 23.9 pg — ABNORMAL LOW (ref 26.0–34.0)
MCHC: 32.7 g/dL (ref 30.0–36.0)
MCV: 73.1 fL — ABNORMAL LOW (ref 80.0–100.0)
Monocytes Absolute: 0.3 10*3/uL (ref 0.1–1.0)
Monocytes Relative: 6 %
Neutro Abs: 3.3 10*3/uL (ref 1.7–7.7)
Neutrophils Relative %: 58 %
Platelet Count: 248 10*3/uL (ref 150–400)
RBC: 4.39 MIL/uL (ref 3.87–5.11)
RDW: 15.5 % (ref 11.5–15.5)
WBC Count: 5.7 10*3/uL (ref 4.0–10.5)
nRBC: 0 % (ref 0.0–0.2)

## 2020-10-23 LAB — CMP (CANCER CENTER ONLY)
ALT: 22 U/L (ref 0–44)
AST: 24 U/L (ref 15–41)
Albumin: 3.4 g/dL — ABNORMAL LOW (ref 3.5–5.0)
Alkaline Phosphatase: 64 U/L (ref 38–126)
Anion gap: 11 (ref 5–15)
BUN: 22 mg/dL (ref 8–23)
CO2: 25 mmol/L (ref 22–32)
Calcium: 10.3 mg/dL (ref 8.9–10.3)
Chloride: 109 mmol/L (ref 98–111)
Creatinine: 1.73 mg/dL — ABNORMAL HIGH (ref 0.44–1.00)
GFR, Estimated: 30 mL/min — ABNORMAL LOW (ref >60)
Glucose, Bld: 118 mg/dL — ABNORMAL HIGH (ref 70–99)
Potassium: 4 mmol/L (ref 3.5–5.1)
Sodium: 145 mmol/L (ref 135–145)
Total Bilirubin: 0.4 mg/dL (ref 0.3–1.2)
Total Protein: 6.9 g/dL (ref 6.5–8.1)

## 2020-10-23 MED ORDER — SODIUM CHLORIDE 0.9 % IV SOLN
3.6000 mg/kg | Freq: Once | INTRAVENOUS | Status: AC
  Administered 2020-10-23: 300 mg via INTRAVENOUS
  Filled 2020-10-23: qty 15

## 2020-10-23 MED ORDER — SODIUM CHLORIDE 0.9 % IV SOLN: Freq: Once | INTRAVENOUS | Status: AC

## 2020-10-23 MED ORDER — LIDOCAINE-PRILOCAINE 2.5-2.5 % EX CREA: 1.0000 "application " | TOPICAL_CREAM | CUTANEOUS | 2 refills | Status: DC | PRN

## 2020-10-23 MED ORDER — SODIUM CHLORIDE 0.9% FLUSH
10.0000 mL | INTRAVENOUS | Status: DC | PRN
  Administered 2020-10-23: 10 mL

## 2020-10-23 MED ORDER — SODIUM CHLORIDE 0.9% FLUSH
10.0000 mL | Freq: Once | INTRAVENOUS | Status: AC
  Administered 2020-10-23: 10 mL

## 2020-10-23 MED ORDER — HEPARIN SOD (PORK) LOCK FLUSH 100 UNIT/ML IV SOLN
500.0000 [IU] | Freq: Once | INTRAVENOUS | Status: AC | PRN
  Administered 2020-10-23: 500 [IU]

## 2020-10-23 MED ORDER — ACETAMINOPHEN 325 MG PO TABS
650.0000 mg | ORAL_TABLET | Freq: Once | ORAL | Status: AC
  Administered 2020-10-23: 650 mg via ORAL
  Filled 2020-10-23: qty 2

## 2020-10-23 MED ORDER — DIPHENHYDRAMINE HCL 25 MG PO CAPS
50.0000 mg | ORAL_CAPSULE | Freq: Once | ORAL | Status: AC
  Administered 2020-10-23: 50 mg via ORAL
  Filled 2020-10-23: qty 2

## 2020-10-23 NOTE — Progress Notes (Signed)
Christy [name]   [phone] [phone]   Clinic Follow up Note   Patient Care Team: Nolene Ebbs, MD as PCP - General (Internal Medicine) Mauro Kaufmann, RN as Oncology Nurse Navigator Rockwell Germany, RN as Oncology Nurse Navigator  Date of Service:  10/23/2020  CHIEF COMPLAINT: f/u of metastatic breast cancer  SUMMARY OF ONCOLOGIC HISTORY: Oncology History Overview Note  Cancer Staging Malignant neoplasm of upper-outer quadrant of female breast [facility]) Staging form: Breast, AJCC 8th Edition - Clinical stage from 07/31/2020: Stage IV (cT2, cN3, cM1, G2, ER-, PR-, HER2+) - Signed by Alla Feeling, NP on 08/31/2020 Stage prefix: Initial diagnosis Nuclear grade: G2 Histologic grading system: 3 grade system    Malignant neoplasm of upper-outer quadrant of female breast ([place])  07/24/2020 Breast US   On physical exam, a very firm mass is identified at the 1:30 position of the LEFT breast 5 cm from the nipple.   IMPRESSION: 1. Highly suspicious 4.3 cm mass at the 1:30 position of the LEFT breast and highly suspicious 2.7 cm mass at the 2 o'clock position of the LEFT breast, with the 2 masses encompassing an area measuring at least 6.1 cm. 2. 3 abnormal LEFT axillary lymph nodes with cortical thickening, tissue sampling of 1 of these lymph nodes recommended. 3. Anterior LEFT breast skin thickening nonspecific but may reflect dermal involvement. 4. No mammographic evidence of RIGHT breast malignancy.   07/31/2020 Initial Biopsy   Diagnosis 1. Breast, left, needle core biopsy, 1 :30 pm, 5cmfn - INVASIVE DUCTAL CARCINOMA, SEE COMMENT. 2. Breast, left, needle core biopsy, 2 o'clock, 8cmfn - INVASIVE DUCTAL CARCINOMA, SEE COMMENT. - DUCTAL CARCINOMA IN SITU. 3. Lymph node, needle/core biopsy, left axillary - METASTATIC CARCINOMA IN A LYMPH NODE. Microscopic Comment 1. and 3. The carcinoma in both parts has a similar morphology and appears grade  2-3. The DCIS in part 2 is high-grade with necrosis. The carcinoma measures 15 mm (part 1) and 14 mm (part 2) in greatest linear extent  2. PROGNOSTIC INDICATORS Results: IMMUNOHISTOCHEMICAL AND MORPHOMETRIC ANALYSIS PERFORMED MANUALLY The tumor cells are POSITIVE for Her2 (3+). Estrogen Receptor: 0%, NEGATIVE Progesterone Receptor: 0%, NEGATIVE Proliferation Marker Ki67: 20%    07/31/2020 Cancer Staging   Staging form: Breast, AJCC 8th Edition - Clinical stage from 07/31/2020: Stage IV (cT2, cN3, cM1, G2, ER-, PR-, HER2+) - Signed by Alla Feeling, NP on 08/31/2020 Stage prefix: Initial diagnosis Nuclear grade: G2 Histologic grading system: 3 grade system   08/13/2020 Initial Diagnosis   Malignant neoplasm of upper-outer quadrant of female breast ([place])   08/28/2020 PET scan   IMPRESSION: Signs of LEFT breast cancer with LEFT axillary and retropectoral adenopathy as well as LEFT supraclavicular adenopathy.   Signs of pulmonary metastatic disease.   Mild asymmetric uptake in the LEFT as compared to the RIGHT adrenal gland. Equivocal but suspicious, attention on follow-up.   Aortic atherosclerosis.   Cystic changes and potential bronchiectasis in the RIGHT lung base likely post infectious or inflammatory. Correlate with any respiratory symptoms. Comparison with prior imaging may be helpful with attention on follow-up.   08/28/2020 Imaging   BRAIN MRI IMPRESSION: 1. Subacute lacunar infarct of the left basal ganglia with no malignant hemorrhagic transformation or mass effect. Underlying chronic bilateral basal ganglia ischemia. 2. No metastatic disease or other acute intracranial abnormality identified.   08/28/2020 Imaging   Breast MRI   IMPRESSION: 1. 8.1 x 8.1 x 5.1 cm area of biopsy-proven malignancy in  the central, upper outer and lower outer quadrants of the left breast. 2. Diffuse anterior left breast skin thickening without abnormal enhancement. This may represent  thickening due to lymphedema associated with lymphatic obstruction. 3. Metastatic level 1 and level 2 left axillary adenopathy. 4. No evidence of malignancy on the right.   09/11/2020 -  Chemotherapy    Patient is on Treatment Plan: BREAST ADO-TRASTUZUMAB EMTANSINE (KADCYLA) Q21D          CURRENT THERAPY:  Kadcyla every 3 weeks, started on 09/11/2020  INTERVAL HISTORY:  Christy [name] is here for a follow up of metastatic breast cancer. She was last seen by me on 10/02/20. She presents to the clinic accompanied by her daughter. She reports she contacted [place] a week ago about pain her right breast. Our telephone note from 10/15/20 states she called about difficulty walking. Her daughter notes she has difficulty getting up from sitting. She reports sometimes her leg "gives out."  In regards to her breast, she reports shooting pain in her right breast that lasted for about an hour. It happened at least twice. She reports her hands "stay ice cold all the time," more so on the right. She notes some discomfort in her foot at baseline due to a bunion.   All other systems were reviewed with the patient and are negative.  MEDICAL HISTORY:  Past Medical History:  Diagnosis Date   Diabetes mellitus without complication ([place])    Hyperlipidemia    Hypertension     SURGICAL HISTORY: Past Surgical History:  Procedure Laterality Date   FOOT FRACTURE SURGERY Right    IR IMAGING GUIDED PORT INSERTION  09/22/2020    I have reviewed the social history and family history with the patient and they are unchanged from previous note.  ALLERGIES:  has No Known Allergies.  MEDICATIONS:  Current Outpatient Medications  Medication Sig Dispense Refill   lidocaine-prilocaine (EMLA) cream Apply 1 application topically as needed. 30 g 2   amLODipine (NORVASC) 5 MG tablet  (Patient not taking: Reported on 09/17/2020)     aspirin EC 325 MG tablet Take 1 tablet (325 mg total) by mouth daily. 30 tablet 5    atorvastatin (LIPITOR) 20 MG tablet Take 1 tablet (20 mg total) by mouth daily. 30 tablet 5   cetirizine (ZYRTEC ALLERGY) 10 MG tablet Take 1 tablet (10 mg total) by mouth daily. (Patient not taking: Reported on 09/17/2020) 30 tablet 0   clotrimazole (CLOTRIMAZOLE AF) 1 % cream Apply 1 application topically 2 (two) times daily. 30 g 0   fluticasone (FLONASE) 50 MCG/ACT nasal spray Place 1 spray into both nostrils daily. (Patient not taking: Reported on 09/17/2020) 1 g 0   metFORMIN (GLUCOPHAGE) 500 MG tablet Take 1 tablet (500 mg total) by mouth 2 (two) times daily with a meal. 60 tablet 1   olmesartan (BENICAR) 40 MG tablet      No current facility-administered medications for this visit.   Facility-Administered Medications Ordered in Other Visits  Medication Dose Route Frequency Provider Last Rate Last Admin   sodium chloride flush (NS) 0.9 % injection 10 mL  10 mL Intracatheter PRN Truitt Merle, MD   10 mL at 10/23/20 1315    PHYSICAL EXAMINATION: ECOG PERFORMANCE STATUS: 2 - Symptomatic, <50% confined to bed  Vitals:   10/23/20 1050  BP: (!) 158/67  Pulse: 60  Resp: 18  Temp: 98.6 F (37 C)  SpO2: 100%   Wt Readings from Last 3 Encounters:  10/23/20 183 lb 1.6 oz (83.1 kg)  10/02/20 181 lb 8 oz (82.3 kg)  09/21/20 184 lb 14.4 oz (83.9 kg)     GENERAL:alert, no distress and comfortable SKIN: skin color, texture, turgor are normal, no rashes or significant lesions EYES: normal, Conjunctiva are pink and non-injected, sclera clear  NECK: supple, thyroid normal size, non-tender, without nodularity LYMPH:  no palpable lymphadenopathy in the cervical, axillary  LUNGS: clear to auscultation and percussion with normal breathing effort HEART: regular rate & rhythm and no murmurs and no lower extremity edema ABDOMEN:abdomen soft, non-tender and normal bowel sounds Musculoskeletal:no cyanosis of digits and no clubbing  NEURO: alert & oriented x 3 with fluent speech, no focal motor/sensory  deficits BREAST: 3.5 x 3 cm index mass in left breast  LABORATORY DATA:  I have reviewed the data as listed CBC Latest Ref Rng & Units 10/23/2020 10/02/2020 09/21/2020  WBC 4.0 - 10.5 K/uL 5.7 5.3 6.5  Hemoglobin 12.0 - 15.0 g/dL 10.5(L) 11.1(L) 11.5(L)  Hematocrit 36.0 - 46.0 % 32.1(L) 33.8(L) 34.3(L)  Platelets 150 - 400 K/uL 248 321 161     CMP Latest Ref Rng & Units 10/23/2020 10/02/2020 09/21/2020  Glucose 70 - 99 mg/dL 118(H) 107(H) 120(H)  BUN 8 - 23 mg/dL 22 19 19   Creatinine 0.44 - 1.00 mg/dL 1.73(H) 1.78(H) 2.05(H)  Sodium 135 - 145 mmol/L 145 144 144  Potassium 3.5 - 5.1 mmol/L 4.0 4.2 3.8  Chloride 98 - 111 mmol/L 109 108 108  CO2 22 - 32 mmol/L 25 27 23   Calcium 8.9 - 10.3 mg/dL 10.3 10.6(H) 10.6(H)  Total Protein 6.5 - 8.1 g/dL 6.9 7.0 7.5  Total Bilirubin 0.3 - 1.2 mg/dL 0.4 0.4 0.3  Alkaline Phos 38 - 126 U/L 64 74 77  AST 15 - 41 U/L 24 24 49(H)  ALT 0 - 44 U/L 22 21 28       RADIOGRAPHIC STUDIES: I have personally reviewed the radiological images as listed and agreed with the findings in the report. No results found.   ASSESSMENT & PLAN:  SHARDA KEDDY is a 80 y.o. female with   1. Malignant neoplasm of overlapping sites in the central and upper out quadrant left breast, invasive ductal carcinoma, cT2N3M1 stage IV, grade 2-3, ER/PR negative HER2 positive, MIB-1 of 20% - with nodal and pulmonary metastases -She initially self palpated a left breast mass in 06/2020, possibly earlier.  Work-up showed 2 masses in the left upper outer breast and 3 abnormal lymph nodes.  Biopsy of the 2 masses and 1 lymph node all showed invasive ductal carcinoma, grade 2-3, ER/PR negative and HER2 positive. -PET scan 08/28/20 shows the hypermetabolic known left breast malignancy with hypermetabolic left axillary, retropectoral, and supraclavicular adenopathy.  Additionally there are several small 3-6 mm pulmonary nodules with mild hypermetabolic activity, felt to be consistent with  pulmonary metastasis.  -baseline labs which shows CKD and mild anemia, CA 27.29 is normal. Baseline echo EF 55-60% -She was started on first-line systemic Kadcyla, given once every 3 weeks, on 09/11/20. She tolerates well with some fatigue. -She reports she never got the emla cream for her port site. I will call this in for her today. -Her daughter asked about restaging scan. We will plan for this in 5-6 weeks, after cycle 4. If her insurance covers it, we will obtain a PET. Otherwise, she will have CT and bone scan. -the index mass measures slightly smaller today on exam today, likely indicating good treatment response. -  Lab reviewed, adequate for treatment, will proceed third cycle Kadcyla today with full dose.  She has stage III CKD, if she does not tolerate full dose, will reduce her dose back again.   2. Genetics -Her sister had metastatic breast cancer in her 54s, and that sisters daughter possibly have breast cancer. -Due to family history and ER/PR negative disease, patient qualifies for genetics. She has not been evaluated or tested yet. -Patient's daughter understands her breast cancer risk and the importance of annual mammography.   3. Spinal stenosis -Reportedly diagnosed in [place], [place].  Patient has chronic back pain and left leg weakness at baseline -She now also has right leg weakness. She is ambulating with a walker. -PET is negative for spinal mets and brain MRI shows subacute lacunar infarct without metastatic disease    4. HTN, DM -on metformin, amlodipine, and olmesartan -f/up PCP Dr. Jeanie Cooks -Denies baseline neuropathy   5. Subacute stroke -Her brain MRI showed subacute lacunar infarct of the left basal ganglia, she has no clinical symptoms of stroke. -She was seen by Dr. Mickeal Skinner on 09/17/20. He recommended cholesterol medicine and full-dose aspirin. She was prescribed lipitor at that time.     PLAN: -I will call in emla cream for her today -proceed with C3 Kadcyla   -labs, flush, f/u, and C4 Kadcyla in 3 weeks -restaging CT chest w contrast  in 5-6 weeks   No problem-specific Assessment & Plan notes found for this encounter.   Orders Placed This Encounter  Procedures   CT Chest W Contrast    Standing Status:   Future    Standing Expiration Date:   10/23/2021    Order Specific Question:   If indicated for the ordered procedure, I authorize the administration of contrast media per Radiology protocol    Answer:   Yes    Order Specific Question:   Preferred imaging location?    Answer:   [facility]   All questions were answered. The patient knows to call the clinic with any problems, questions or concerns. No barriers to learning was detected. The total time spent in the appointment was 30 minutes.     Truitt Merle, MD 10/23/2020   I, Wilburn Mylar, am acting as scribe for Truitt Merle, MD.   I have reviewed the above documentation for accuracy and completeness, and I agree with the above.

## 2020-10-23 NOTE — Progress Notes (Signed)
Dr. Burr Medico aware fo creatinine of 1.73, okay to proceed with tx as scheduled

## 2020-10-23 NOTE — Patient Instructions (Signed)
[facility]  Discharge Instructions: Thank you for choosing [facility] to provide your oncology and hematology care.   If you have a lab appointment with the [facility], please go directly to the [facility] and check in at the registration area.   Wear comfortable clothing and clothing appropriate for easy access to any Portacath or PICC line.   We strive to give you quality time with your provider. You may need to reschedule your appointment if you arrive late (15 or more minutes).  Arriving late affects you and other patients whose appointments are after yours.  Also, if you miss three or more appointments without notifying the office, you may be dismissed from the clinic at the provider's discretion.      For prescription refill requests, have your pharmacy contact our office and allow 72 hours for refills to be completed.    Today you received the following chemotherapy and/or immunotherapy agents : Kadcyla   To help prevent nausea and vomiting after your treatment, we encourage you to take your nausea medication as directed.  BELOW ARE SYMPTOMS THAT SHOULD BE REPORTED IMMEDIATELY: *FEVER GREATER THAN 100.4 F (38 C) OR HIGHER *CHILLS OR SWEATING *NAUSEA AND VOMITING THAT IS NOT CONTROLLED WITH YOUR NAUSEA MEDICATION *UNUSUAL SHORTNESS OF BREATH *UNUSUAL BRUISING OR BLEEDING *URINARY PROBLEMS (pain or burning when urinating, or frequent urination) *BOWEL PROBLEMS (unusual diarrhea, constipation, pain near the anus) TENDERNESS IN MOUTH AND THROAT WITH OR WITHOUT PRESENCE OF ULCERS (sore throat, sores in mouth, or a toothache) UNUSUAL RASH, SWELLING OR PAIN  UNUSUAL VAGINAL DISCHARGE OR ITCHING   Items with * indicate a potential emergency and should be followed up as soon as possible or go to the Emergency Department if any problems should occur.  Please show the CHEMOTHERAPY ALERT CARD or IMMUNOTHERAPY ALERT CARD at check-in to the  Emergency Department and triage nurse.  Should you have questions after your visit or need to cancel or reschedule your appointment, please contact [facility]  Dept: [phone]  and follow the prompts.  Office hours are 8:00 a.m. to 4:30 p.m. Monday - Friday. Please note that voicemails left after 4:00 p.m. may not be returned until the following business day.  We are closed weekends and major holidays. You have access to a nurse at all times for urgent questions. Please call the main number to the clinic Dept: [phone] and follow the prompts.   For any non-urgent questions, you may also contact your provider using MyChart. We now offer e-Visits for anyone 26 and older to request care online for non-urgent symptoms. For details visit [email or web address].   Also download the MyChart app! Go to the app store, search "MyChart", open the app, select [facility], and log in with your MyChart username and password.  Due to Covid, a mask is required upon entering the hospital/clinic. If you do not have a mask, one will be given to you upon arrival. For doctor visits, patients may have 1 support person aged 55 or older with them. For treatment visits, patients cannot have anyone with them due to current Covid guidelines and our immunocompromised population.

## 2020-10-23 NOTE — Telephone Encounter (Signed)
Notified Patient of Prior Authorization approval for Lidocaine-Prilocaine 2.5-2.5% Cream. Authorization is approved from 10/23/20 through 10/23/21

## 2020-10-24 LAB — CANCER ANTIGEN 27.29: CA 27.29: 31.6 U/mL (ref 0.0–38.6)

## 2020-11-04 ENCOUNTER — Telehealth: Payer: Self-pay

## 2020-11-04 NOTE — Telephone Encounter (Signed)
This nurse received a phone call from patient requesting to know how Emla cream should be applied and when.  This nurse explained use of the cream.  Patient acknowledged understanding.  Knows to call clinic if there are any further questions or concerns.

## 2020-11-12 ENCOUNTER — Telehealth: Payer: Self-pay

## 2020-11-12 NOTE — Telephone Encounter (Signed)
This nurse received a message from this patient to return call. Attempted to reach her, no answer.   Left a message for this patient to return call to the facility.

## 2020-11-12 NOTE — Telephone Encounter (Signed)
The nurse spoke with patient and informed her that MD would like her to try Milk of Magnesia or Magnesium Citrate for her constipation.  Nurse vrified with patient that her difficulty with urinating also started on Saturday or Sunday.  She states that she is urinating its just not as much as usual or as often.  Patient states that she will try to get the Magnesium Citrate tomorrow, she does to have transportation to get it today.  This nurse reminded patient of her appointment times on 9/9 and she states that she will be here.  No further questions or concerns at this time.

## 2020-11-12 NOTE — Telephone Encounter (Signed)
This nurse received call from patient who states that she has been having constipation since 9/4 and has also been unable to urinate.  She states that she feels the urge and she is only having trickles.  Patient states that she went to the pharmacy and they gave her Clearlax and it has not been effective.  Patient also states that she is having some issue with her skin.  States that it looks like a rash but she believes it is just dry skin.  This nurse advised that this information will be forwarded to the MD for recommendations.

## 2020-11-13 ENCOUNTER — Other Ambulatory Visit: Payer: Self-pay

## 2020-11-13 ENCOUNTER — Inpatient Hospital Stay: Payer: Medicare Other | Attending: Nurse Practitioner

## 2020-11-13 ENCOUNTER — Inpatient Hospital Stay: Payer: Medicare Other

## 2020-11-13 ENCOUNTER — Inpatient Hospital Stay (HOSPITAL_BASED_OUTPATIENT_CLINIC_OR_DEPARTMENT_OTHER): Payer: Medicare Other | Admitting: Hematology

## 2020-11-13 ENCOUNTER — Encounter: Payer: Self-pay | Admitting: Hematology

## 2020-11-13 VITALS — BP 148/64 | HR 75 | Temp 97.8°F | Resp 18

## 2020-11-13 VITALS — BP 174/71 | HR 64 | Temp 98.0°F | Resp 18 | Ht 64.0 in | Wt 177.2 lb

## 2020-11-13 DIAGNOSIS — L853 Xerosis cutis: Secondary | ICD-10-CM | POA: Diagnosis not present

## 2020-11-13 DIAGNOSIS — Z79899 Other long term (current) drug therapy: Secondary | ICD-10-CM | POA: Insufficient documentation

## 2020-11-13 DIAGNOSIS — N189 Chronic kidney disease, unspecified: Secondary | ICD-10-CM | POA: Diagnosis not present

## 2020-11-13 DIAGNOSIS — E1122 Type 2 diabetes mellitus with diabetic chronic kidney disease: Secondary | ICD-10-CM | POA: Insufficient documentation

## 2020-11-13 DIAGNOSIS — Z5112 Encounter for antineoplastic immunotherapy: Secondary | ICD-10-CM | POA: Diagnosis present

## 2020-11-13 DIAGNOSIS — R234 Changes in skin texture: Secondary | ICD-10-CM | POA: Insufficient documentation

## 2020-11-13 DIAGNOSIS — M4800 Spinal stenosis, site unspecified: Secondary | ICD-10-CM | POA: Diagnosis not present

## 2020-11-13 DIAGNOSIS — Z171 Estrogen receptor negative status [ER-]: Secondary | ICD-10-CM | POA: Diagnosis not present

## 2020-11-13 DIAGNOSIS — I6782 Cerebral ischemia: Secondary | ICD-10-CM | POA: Diagnosis not present

## 2020-11-13 DIAGNOSIS — Z803 Family history of malignant neoplasm of breast: Secondary | ICD-10-CM | POA: Insufficient documentation

## 2020-11-13 DIAGNOSIS — C7800 Secondary malignant neoplasm of unspecified lung: Secondary | ICD-10-CM | POA: Insufficient documentation

## 2020-11-13 DIAGNOSIS — I700 Atherosclerosis of aorta: Secondary | ICD-10-CM | POA: Diagnosis not present

## 2020-11-13 DIAGNOSIS — Z95828 Presence of other vascular implants and grafts: Secondary | ICD-10-CM

## 2020-11-13 DIAGNOSIS — R5383 Other fatigue: Secondary | ICD-10-CM | POA: Diagnosis not present

## 2020-11-13 DIAGNOSIS — M549 Dorsalgia, unspecified: Secondary | ICD-10-CM | POA: Diagnosis not present

## 2020-11-13 DIAGNOSIS — C50412 Malignant neoplasm of upper-outer quadrant of left female breast: Secondary | ICD-10-CM

## 2020-11-13 DIAGNOSIS — K5900 Constipation, unspecified: Secondary | ICD-10-CM | POA: Diagnosis not present

## 2020-11-13 DIAGNOSIS — M79604 Pain in right leg: Secondary | ICD-10-CM | POA: Insufficient documentation

## 2020-11-13 DIAGNOSIS — I6381 Other cerebral infarction due to occlusion or stenosis of small artery: Secondary | ICD-10-CM | POA: Diagnosis not present

## 2020-11-13 DIAGNOSIS — G8929 Other chronic pain: Secondary | ICD-10-CM | POA: Diagnosis not present

## 2020-11-13 DIAGNOSIS — R590 Localized enlarged lymph nodes: Secondary | ICD-10-CM | POA: Diagnosis not present

## 2020-11-13 DIAGNOSIS — I129 Hypertensive chronic kidney disease with stage 1 through stage 4 chronic kidney disease, or unspecified chronic kidney disease: Secondary | ICD-10-CM | POA: Insufficient documentation

## 2020-11-13 LAB — CBC WITH DIFFERENTIAL (CANCER CENTER ONLY)
Abs Immature Granulocytes: 0.01 10*3/uL (ref 0.00–0.07)
Basophils Absolute: 0 10*3/uL (ref 0.0–0.1)
Basophils Relative: 1 %
Eosinophils Absolute: 0.2 10*3/uL (ref 0.0–0.5)
Eosinophils Relative: 4 %
HCT: 32.3 % — ABNORMAL LOW (ref 36.0–46.0)
Hemoglobin: 10.7 g/dL — ABNORMAL LOW (ref 12.0–15.0)
Immature Granulocytes: 0 %
Lymphocytes Relative: 35 %
Lymphs Abs: 1.9 10*3/uL (ref 0.7–4.0)
MCH: 24.1 pg — ABNORMAL LOW (ref 26.0–34.0)
MCHC: 33.1 g/dL (ref 30.0–36.0)
MCV: 72.7 fL — ABNORMAL LOW (ref 80.0–100.0)
Monocytes Absolute: 0.4 10*3/uL (ref 0.1–1.0)
Monocytes Relative: 7 %
Neutro Abs: 2.9 10*3/uL (ref 1.7–7.7)
Neutrophils Relative %: 53 %
Platelet Count: 262 10*3/uL (ref 150–400)
RBC: 4.44 MIL/uL (ref 3.87–5.11)
RDW: 15.8 % — ABNORMAL HIGH (ref 11.5–15.5)
WBC Count: 5.4 10*3/uL (ref 4.0–10.5)
nRBC: 0 % (ref 0.0–0.2)

## 2020-11-13 LAB — CMP (CANCER CENTER ONLY)
ALT: 21 U/L (ref 0–44)
AST: 31 U/L (ref 15–41)
Albumin: 3.5 g/dL (ref 3.5–5.0)
Alkaline Phosphatase: 63 U/L (ref 38–126)
Anion gap: 11 (ref 5–15)
BUN: 18 mg/dL (ref 8–23)
CO2: 25 mmol/L (ref 22–32)
Calcium: 10.6 mg/dL — ABNORMAL HIGH (ref 8.9–10.3)
Chloride: 109 mmol/L (ref 98–111)
Creatinine: 1.8 mg/dL — ABNORMAL HIGH (ref 0.44–1.00)
GFR, Estimated: 28 mL/min — ABNORMAL LOW (ref >60)
Glucose, Bld: 98 mg/dL (ref 70–99)
Potassium: 4 mmol/L (ref 3.5–5.1)
Sodium: 145 mmol/L (ref 135–145)
Total Bilirubin: 0.4 mg/dL (ref 0.3–1.2)
Total Protein: 7.2 g/dL (ref 6.5–8.1)

## 2020-11-13 MED ORDER — ACETAMINOPHEN 325 MG PO TABS
650.0000 mg | ORAL_TABLET | Freq: Once | ORAL | Status: AC
  Administered 2020-11-13: 650 mg via ORAL
  Filled 2020-11-13: qty 2

## 2020-11-13 MED ORDER — SODIUM CHLORIDE 0.9 % IV SOLN
Freq: Once | INTRAVENOUS | Status: AC
Start: 2020-11-13 — End: 2020-11-13

## 2020-11-13 MED ORDER — SODIUM CHLORIDE 0.9 % IV SOLN: 3.0000 mg/kg | Freq: Once | INTRAVENOUS | Status: DC

## 2020-11-13 MED ORDER — SODIUM CHLORIDE 0.9 % IV SOLN: Freq: Once | INTRAVENOUS | Status: AC

## 2020-11-13 MED ORDER — SODIUM CHLORIDE 0.9% FLUSH
10.0000 mL | Freq: Once | INTRAVENOUS | Status: AC
  Administered 2020-11-13: 10 mL

## 2020-11-13 MED ORDER — HEPARIN SOD (PORK) LOCK FLUSH 100 UNIT/ML IV SOLN
500.0000 [IU] | Freq: Once | INTRAVENOUS | Status: AC | PRN
  Administered 2020-11-13: 500 [IU]

## 2020-11-13 MED ORDER — DIPHENHYDRAMINE HCL 25 MG PO CAPS
50.0000 mg | ORAL_CAPSULE | Freq: Once | ORAL | Status: AC
  Administered 2020-11-13: 50 mg via ORAL
  Filled 2020-11-13: qty 2

## 2020-11-13 MED ORDER — SODIUM CHLORIDE 0.9% FLUSH
10.0000 mL | INTRAVENOUS | Status: DC | PRN
  Administered 2020-11-13: 10 mL

## 2020-11-13 MED ORDER — SODIUM CHLORIDE 0.9 % IV SOLN
3.0000 mg/kg | Freq: Once | INTRAVENOUS | Status: AC
  Administered 2020-11-13: 240 mg via INTRAVENOUS
  Filled 2020-11-13: qty 8

## 2020-11-13 NOTE — Progress Notes (Addendum)
[facility]   [phone] [phone]   Clinic Follow up Note   Patient Care Team: Nolene Ebbs, MD as PCP - General (Internal Medicine) Mauro Kaufmann, RN as Oncology Nurse Navigator Rockwell Germany, RN as Oncology Nurse Navigator  Date of Service:  11/13/2020  CHIEF COMPLAINT: f/u of metastatic breast cancer  CURRENT THERAPY:  Kadcyla every 3 weeks, started on 09/11/2020  ASSESSMENT & PLAN:  Christy [name] is a 80 y.o. female with   1. Malignant neoplasm of overlapping sites in the central and upper out quadrant left breast, invasive ductal carcinoma, cT2N3M1 stage IV, grade 2-3, ER/PR negative HER2 positive, MIB-1 of 20% - with nodal and pulmonary metastases -She initially self palpated a left breast mass in 06/2020, possibly earlier.  Work-up showed 2 masses in the left upper outer breast and 3 abnormal lymph nodes.  Biopsy of the 2 masses and 1 lymph node all showed invasive ductal carcinoma, grade 2-3, ER/PR negative and HER2 positive. -PET scan 08/28/20 shows the hypermetabolic known left breast malignancy with hypermetabolic left axillary, retropectoral, and supraclavicular adenopathy.  Additionally there are several small 3-6 mm pulmonary nodules with mild hypermetabolic activity, felt to be consistent with pulmonary metastasis.  -baseline labs which shows CKD and mild anemia, CA 27.29 is normal. Baseline echo EF 55-60% -She was started on first-line systemic Kadcyla, given once every 3 weeks, on 09/11/20. She tolerates well with some fatigue. -I will decrease her Kadcyla dose today due to fatigue. Labs reviewed, overall adequate for treatment. -plan for restaging scan after this cycle.   2. Symptom management: Constipation, difficulty urinating, diminished appetite/weight loss, fatigue -chronic, but worsening -has not had a bowel movement in almost a week. She tried OTC medication but only took one a day. It did not help. I recommend either  increasing the dose or using Magnesium Citrate. -also reports only urinating once a day, despite feeling the need to go -she does not eat enough. Given her DM, I recommend she use Glucerna for nutritional supplement.   3. Genetics -Her sister had metastatic breast cancer in her 58s, and that sisters daughter possibly have breast cancer. -Due to family history and ER/PR negative disease, patient qualifies for genetics. She has not been evaluated or tested yet. -Patient's daughter understands her breast cancer risk and the importance of annual mammography.   4. Spinal stenosis -Reportedly diagnosed in [place], [place].  Patient has chronic back pain and left leg weakness at baseline -She now also has right leg weakness. She is ambulating with a walker. -PET is negative for spinal mets and brain MRI shows subacute lacunar infarct without metastatic disease    5. HTN, DM -on metformin, amlodipine, and olmesartan -f/up PCP Dr. Jeanie Cooks -Denies baseline neuropathy -her BP is elevated today. She is unsure what she is taking.   6. Subacute stroke -Her brain MRI showed subacute lacunar infarct of the left basal ganglia, she has no clinical symptoms of stroke. -She was seen by Dr. Mickeal Skinner on 09/17/20. He recommended cholesterol medicine and full-dose aspirin. She was prescribed lipitor at that time.     PLAN: -proceed with C4 Kadcyla with dose reduction to 52m/kg due to fatigue  -restaging CT chest in 2-3 weeks -labs, flush, f/u, and C5 Kadcyla in 3 weeks (after scans) -she will try milk of magnesium for constipation, and increased MiraLAX  -NS [id number]   No problem-specific Assessment & Plan notes found for this encounter.   SUMMARY OF ONCOLOGIC HISTORY:  Oncology History Overview Note  Cancer Staging Malignant neoplasm of upper-outer quadrant of female breast [facility]) Staging form: Breast, AJCC 8th Edition - Clinical stage from 07/31/2020: Stage IV (cT2, cN3, cM1, G2, ER-, PR-, HER2+) - Signed  by Alla Feeling, NP on 08/31/2020 Stage prefix: Initial diagnosis Nuclear grade: G2 Histologic grading system: 3 grade system    Malignant neoplasm of upper-outer quadrant of female breast ([place])  07/24/2020 Breast US   On physical exam, a very firm mass is identified at the 1:30 position of the LEFT breast 5 cm from the nipple.   IMPRESSION: 1. Highly suspicious 4.3 cm mass at the 1:30 position of the LEFT breast and highly suspicious 2.7 cm mass at the 2 o'clock position of the LEFT breast, with the 2 masses encompassing an area measuring at least 6.1 cm. 2. 3 abnormal LEFT axillary lymph nodes with cortical thickening, tissue sampling of 1 of these lymph nodes recommended. 3. Anterior LEFT breast skin thickening nonspecific but may reflect dermal involvement. 4. No mammographic evidence of RIGHT breast malignancy.   07/31/2020 Initial Biopsy   Diagnosis 1. Breast, left, needle core biopsy, 1 :30 pm, 5cmfn - INVASIVE DUCTAL CARCINOMA, SEE COMMENT. 2. Breast, left, needle core biopsy, 2 o'clock, 8cmfn - INVASIVE DUCTAL CARCINOMA, SEE COMMENT. - DUCTAL CARCINOMA IN SITU. 3. Lymph node, needle/core biopsy, left axillary - METASTATIC CARCINOMA IN A LYMPH NODE. Microscopic Comment 1. and 3. The carcinoma in both parts has a similar morphology and appears grade 2-3. The DCIS in part 2 is high-grade with necrosis. The carcinoma measures 15 mm (part 1) and 14 mm (part 2) in greatest linear extent  2. PROGNOSTIC INDICATORS Results: IMMUNOHISTOCHEMICAL AND MORPHOMETRIC ANALYSIS PERFORMED MANUALLY The tumor cells are POSITIVE for Her2 (3+). Estrogen Receptor: 0%, NEGATIVE Progesterone Receptor: 0%, NEGATIVE Proliferation Marker Ki67: 20%    07/31/2020 Cancer Staging   Staging form: Breast, AJCC 8th Edition - Clinical stage from 07/31/2020: Stage IV (cT2, cN3, cM1, G2, ER-, PR-, HER2+) - Signed by Alla Feeling, NP on 08/31/2020 Stage prefix: Initial diagnosis Nuclear grade:  G2 Histologic grading system: 3 grade system   08/13/2020 Initial Diagnosis   Malignant neoplasm of upper-outer quadrant of female breast ([place])   08/28/2020 PET scan   IMPRESSION: Signs of LEFT breast cancer with LEFT axillary and retropectoral adenopathy as well as LEFT supraclavicular adenopathy.   Signs of pulmonary metastatic disease.   Mild asymmetric uptake in the LEFT as compared to the RIGHT adrenal gland. Equivocal but suspicious, attention on follow-up.   Aortic atherosclerosis.   Cystic changes and potential bronchiectasis in the RIGHT lung base likely post infectious or inflammatory. Correlate with any respiratory symptoms. Comparison with prior imaging may be helpful with attention on follow-up.   08/28/2020 Imaging   BRAIN MRI IMPRESSION: 1. Subacute lacunar infarct of the left basal ganglia with no malignant hemorrhagic transformation or mass effect. Underlying chronic bilateral basal ganglia ischemia. 2. No metastatic disease or other acute intracranial abnormality identified.   08/28/2020 Imaging   Breast MRI   IMPRESSION: 1. 8.1 x 8.1 x 5.1 cm area of biopsy-proven malignancy in the central, upper outer and lower outer quadrants of the left breast. 2. Diffuse anterior left breast skin thickening without abnormal enhancement. This may represent thickening due to lymphedema associated with lymphatic obstruction. 3. Metastatic level 1 and level 2 left axillary adenopathy. 4. No evidence of malignancy on the right.   09/11/2020 -  Chemotherapy    Patient is on Treatment  Plan: BREAST ADO-TRASTUZUMAB EMTANSINE [facility]) Q21D          INTERVAL HISTORY:  LADELL BEY is here for a follow up of metastatic breast cancer. She was last seen by me on 10/23/20. She presents to the clinic accompanied by her daughter. She reports severe constipation, with no bowel movement for a week. Her daughter notes she tried an OTC laxative with no relief. She only took one dose  a day. She reports dry skin to her lower arms/hands. She reports she does not eat much. She is not sure why. She notes she stays active but is always tired. She also reports her legs are sore, mainly her right. She notes the leg pain is chronic but feels the right leg/knee is worse.   All other systems were reviewed with the patient and are negative.   MEDICAL HISTORY:  Past Medical History:  Diagnosis Date   Diabetes mellitus without complication ([place])    Hyperlipidemia    Hypertension     SURGICAL HISTORY: Past Surgical History:  Procedure Laterality Date   FOOT FRACTURE SURGERY Right    IR IMAGING GUIDED PORT INSERTION  09/22/2020    I have reviewed the social history and family history with the patient and they are unchanged from previous note.  ALLERGIES:  has No Known Allergies.  MEDICATIONS:  Current Outpatient Medications  Medication Sig Dispense Refill   amLODipine (NORVASC) 5 MG tablet  (Patient not taking: Reported on 09/17/2020)     aspirin EC 325 MG tablet Take 1 tablet (325 mg total) by mouth daily. 30 tablet 5   atorvastatin (LIPITOR) 20 MG tablet Take 1 tablet (20 mg total) by mouth daily. 30 tablet 5   cetirizine (ZYRTEC ALLERGY) 10 MG tablet Take 1 tablet (10 mg total) by mouth daily. (Patient not taking: Reported on 09/17/2020) 30 tablet 0   clotrimazole (CLOTRIMAZOLE AF) 1 % cream Apply 1 application topically 2 (two) times daily. 30 g 0   fluticasone (FLONASE) 50 MCG/ACT nasal spray Place 1 spray into both nostrils daily. (Patient not taking: Reported on 09/17/2020) 1 g 0   lidocaine-prilocaine (EMLA) cream Apply 1 application topically as needed. 30 g 2   metFORMIN (GLUCOPHAGE) 500 MG tablet Take 1 tablet (500 mg total) by mouth 2 (two) times daily with a meal. 60 tablet 1   olmesartan (BENICAR) 40 MG tablet      No current facility-administered medications for this visit.   Facility-Administered Medications Ordered in Other Visits  Medication Dose Route  Frequency Provider Last Rate Last Admin   sodium chloride flush (NS) 0.9 % injection 10 mL  10 mL Intracatheter PRN Truitt Merle, MD   10 mL at 11/13/20 1617    PHYSICAL EXAMINATION: ECOG PERFORMANCE STATUS: 2 - Symptomatic, <50% confined to bed  Vitals:   11/13/20 1343  BP: (!) 174/71  Pulse: 64  Resp: 18  Temp: 98 F (36.7 C)  SpO2: 98%   Wt Readings from Last 3 Encounters:  11/13/20 177 lb 3.2 oz (80.4 kg)  10/23/20 183 lb 1.6 oz (83.1 kg)  10/02/20 181 lb 8 oz (82.3 kg)     GENERAL:alert, no distress and comfortable SKIN: skin color, texture, turgor are normal, no rashes or significant lesions EYES: normal, Conjunctiva are pink and non-injected, sclera clear  NECK: supple, thyroid normal size, non-tender, without nodularity LYMPH:  no palpable lymphadenopathy in the cervical, axillary  LUNGS: clear to auscultation and percussion with normal breathing effort HEART: regular rate &  rhythm and no murmurs and no lower extremity edema ABDOMEN:abdomen soft, non-tender and normal bowel sounds Musculoskeletal:no cyanosis of digits and no clubbing  NEURO: alert & oriented x 3 with fluent speech, no focal motor/sensory deficits  LABORATORY DATA:  I have reviewed the data as listed CBC Latest Ref Rng & Units 11/13/2020 10/23/2020 10/02/2020  WBC 4.0 - 10.5 K/uL 5.4 5.7 5.3  Hemoglobin 12.0 - 15.0 g/dL 10.7(L) 10.5(L) 11.1(L)  Hematocrit 36.0 - 46.0 % 32.3(L) 32.1(L) 33.8(L)  Platelets 150 - 400 K/uL 262 248 321     CMP Latest Ref Rng & Units 11/13/2020 10/23/2020 10/02/2020  Glucose 70 - 99 mg/dL 98 118(H) 107(H)  BUN 8 - 23 mg/dL 18 22 19   Creatinine 0.44 - 1.00 mg/dL 1.80(H) 1.73(H) 1.78(H)  Sodium 135 - 145 mmol/L 145 145 144  Potassium 3.5 - 5.1 mmol/L 4.0 4.0 4.2  Chloride 98 - 111 mmol/L 109 109 108  CO2 22 - 32 mmol/L 25 25 27   Calcium 8.9 - 10.3 mg/dL 10.6(H) 10.3 10.6(H)  Total Protein 6.5 - 8.1 g/dL 7.2 6.9 7.0  Total Bilirubin 0.3 - 1.2 mg/dL 0.4 0.4 0.4  Alkaline Phos  38 - 126 U/L 63 64 74  AST 15 - 41 U/L 31 24 24   ALT 0 - 44 U/L 21 22 21       RADIOGRAPHIC STUDIES: I have personally reviewed the radiological images as listed and agreed with the findings in the report. No results found.    No orders of the defined types were placed in this encounter.  All questions were answered. The patient knows to call the clinic with any problems, questions or concerns. No barriers to learning was detected. The total time spent in the appointment was 30 minutes.     Truitt Merle, MD 11/13/2020   I, Wilburn Mylar, am acting as scribe for Truitt Merle, MD.   I have reviewed the above documentation for accuracy and completeness, and I agree with the above.

## 2020-11-13 NOTE — Patient Instructions (Signed)
[facility] ONCOLOGY   Discharge Instructions: Thank you for choosing [facility] to provide your oncology and hematology care.   If you have a lab appointment with the [facility], please go directly to the [facility] and check in at the registration area.   Wear comfortable clothing and clothing appropriate for easy access to any Portacath or PICC line.   We strive to give you quality time with your provider. You may need to reschedule your appointment if you arrive late (15 or more minutes).  Arriving late affects you and other patients whose appointments are after yours.  Also, if you miss three or more appointments without notifying the office, you may be dismissed from the clinic at the provider's discretion.      For prescription refill requests, have your pharmacy contact our office and allow 72 hours for refills to be completed.    Today you received the following chemotherapy and/or immunotherapy agents Trastuzumab Emtansine (Kadcyla).      To help prevent nausea and vomiting after your treatment, we encourage you to take your nausea medication as directed.  BELOW ARE SYMPTOMS THAT SHOULD BE REPORTED IMMEDIATELY: *FEVER GREATER THAN 100.4 F (38 C) OR HIGHER *CHILLS OR SWEATING *NAUSEA AND VOMITING THAT IS NOT CONTROLLED WITH YOUR NAUSEA MEDICATION *UNUSUAL SHORTNESS OF BREATH *UNUSUAL BRUISING OR BLEEDING *URINARY PROBLEMS (pain or burning when urinating, or frequent urination) *BOWEL PROBLEMS (unusual diarrhea, constipation, pain near the anus) TENDERNESS IN MOUTH AND THROAT WITH OR WITHOUT PRESENCE OF ULCERS (sore throat, sores in mouth, or a toothache) UNUSUAL RASH, SWELLING OR PAIN  UNUSUAL VAGINAL DISCHARGE OR ITCHING   Items with * indicate a potential emergency and should be followed up as soon as possible or go to the Emergency Department if any problems should occur.  Please show the CHEMOTHERAPY ALERT CARD or IMMUNOTHERAPY  ALERT CARD at check-in to the Emergency Department and triage nurse.  Should you have questions after your visit or need to cancel or reschedule your appointment, please contact [facility]  Dept: [phone]  and follow the prompts.  Office hours are 8:00 a.m. to 4:30 p.m. Monday - Friday. Please note that voicemails left after 4:00 p.m. may not be returned until the following business day.  We are closed weekends and major holidays. You have access to a nurse at all times for urgent questions. Please call the main number to the clinic Dept: [phone] and follow the prompts.   For any non-urgent questions, you may also contact your provider using MyChart. We now offer e-Visits for anyone 54 and older to request care online for non-urgent symptoms. For details visit [email or web address].   Also download the MyChart app! Go to the app store, search "MyChart", open the app, select [facility], and log in with your MyChart username and password.  Due to Covid, a mask is required upon entering the hospital/clinic. If you do not have a mask, one will be given to you upon arrival. For doctor visits, patients may have 1 support person aged 85 or older with them. For treatment visits, patients cannot have anyone with them due to current Covid guidelines and our immunocompromised population.

## 2020-11-13 NOTE — Progress Notes (Signed)
Per Dr. Burr Medico, okay to treat with elevated SCr.

## 2020-11-14 LAB — CANCER ANTIGEN 27.29: CA 27.29: 34.1 U/mL (ref 0.0–38.6)

## 2020-11-16 ENCOUNTER — Telehealth: Payer: Self-pay | Admitting: Hematology

## 2020-11-16 NOTE — Telephone Encounter (Signed)
Left message with follow-up appointment per 9/9 los.

## 2020-11-19 ENCOUNTER — Telehealth: Payer: Self-pay

## 2020-11-19 NOTE — Telephone Encounter (Signed)
This nurse spoke with patient who called in because she was confused about her scheduled appointments.  This nurse went over appt schedule with patient.  She acknowledged understanding.  No further questions or concerns at this time.

## 2020-11-26 ENCOUNTER — Other Ambulatory Visit: Payer: Self-pay

## 2020-11-26 DIAGNOSIS — C50412 Malignant neoplasm of upper-outer quadrant of left female breast: Secondary | ICD-10-CM

## 2020-11-26 DIAGNOSIS — Z171 Estrogen receptor negative status [ER-]: Secondary | ICD-10-CM

## 2020-11-30 ENCOUNTER — Ambulatory Visit (HOSPITAL_COMMUNITY)
Admission: RE | Admit: 2020-11-30 | Discharge: 2020-11-30 | Disposition: A | Discharge status: Home / Self Care | Payer: Medicare Other | Source: Ambulatory Visit | Attending: Hematology | Admitting: Hematology

## 2020-11-30 ENCOUNTER — Other Ambulatory Visit: Payer: Self-pay

## 2020-11-30 ENCOUNTER — Encounter (HOSPITAL_COMMUNITY): Payer: Self-pay

## 2020-11-30 DIAGNOSIS — C50412 Malignant neoplasm of upper-outer quadrant of left female breast: Secondary | ICD-10-CM | POA: Insufficient documentation

## 2020-11-30 DIAGNOSIS — Z171 Estrogen receptor negative status [ER-]: Secondary | ICD-10-CM | POA: Diagnosis present

## 2020-11-30 IMAGING — CT CT CHEST W/O CM
2 of 4 series · 15 of 36 positions shown, 18 images · non-contrast
Comparison: PET-CT [DATE]

CLINICAL DATA: Breast cancer.  Restaging.

EXAM:
CT CHEST WITHOUT CONTRAST
TECHNIQUE: Multidetector CT imaging of the chest was performed following the
standard protocol without IV contrast.

[Series 2: thorax · axial · 0.73mm/px · z∈[-271,-21]mm · 12 of 149 slices shown, 15 images]
[im 12/149  mediastinal]
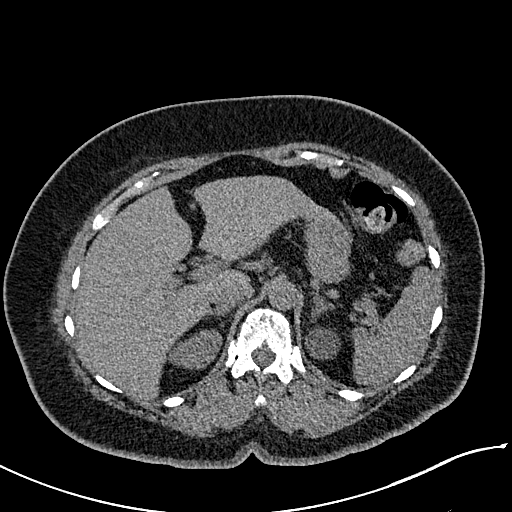
[im 12/149  lung]
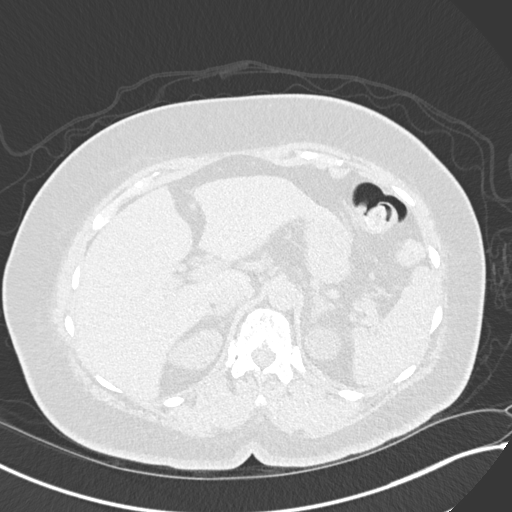
[im 23/149  lung]
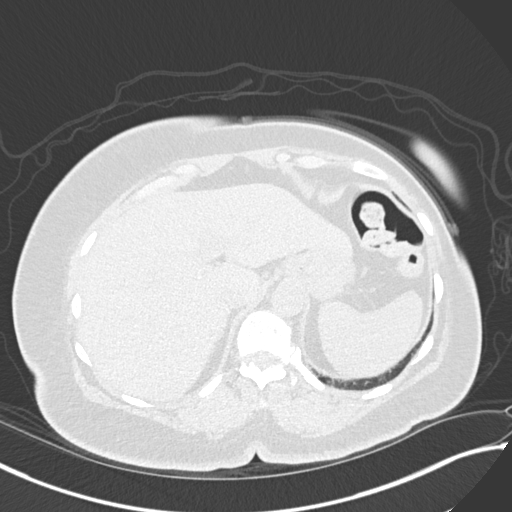
[im 35/149  lung]
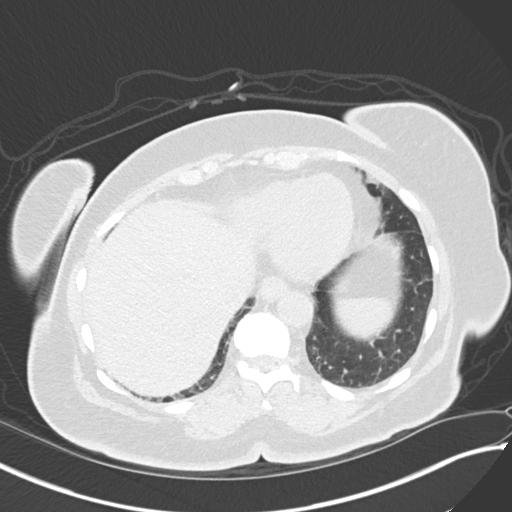
[im 46/149  lung]
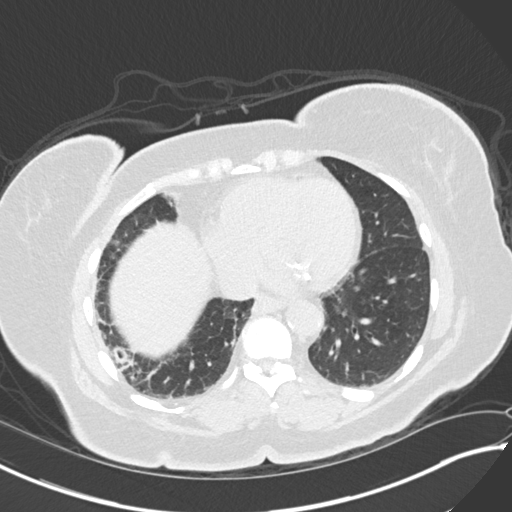
[im 57/149  mediastinal]
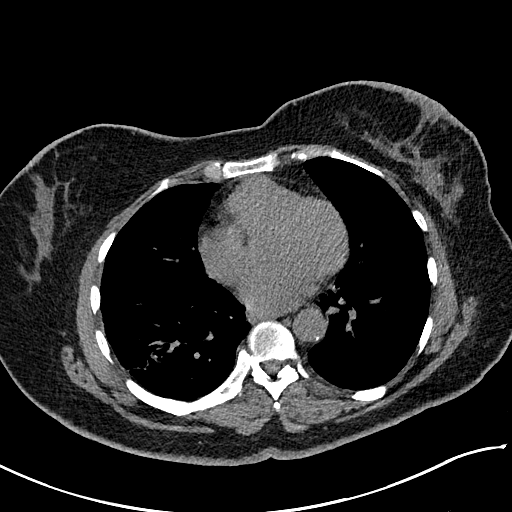
[im 57/149  lung]
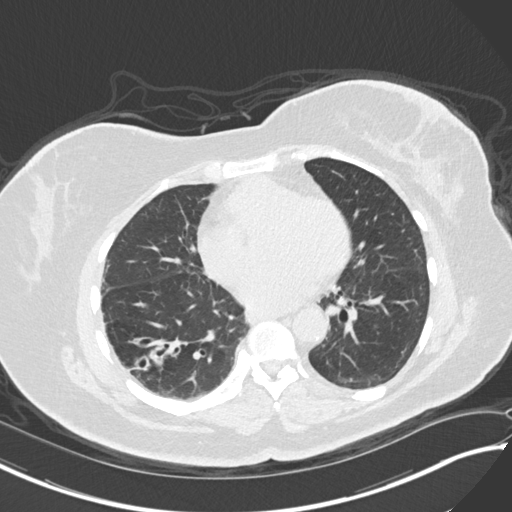
[im 69/149  lung]
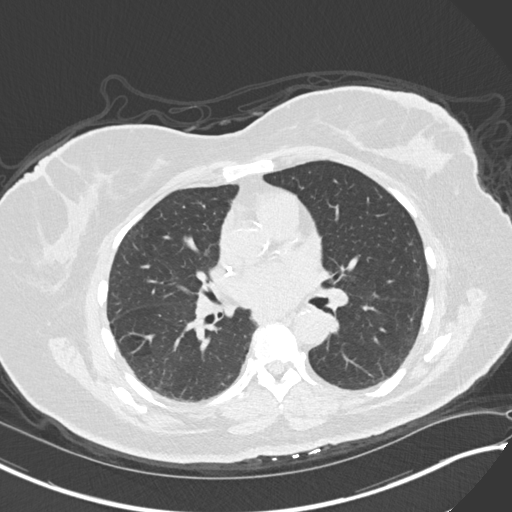
[im 80/149  lung]
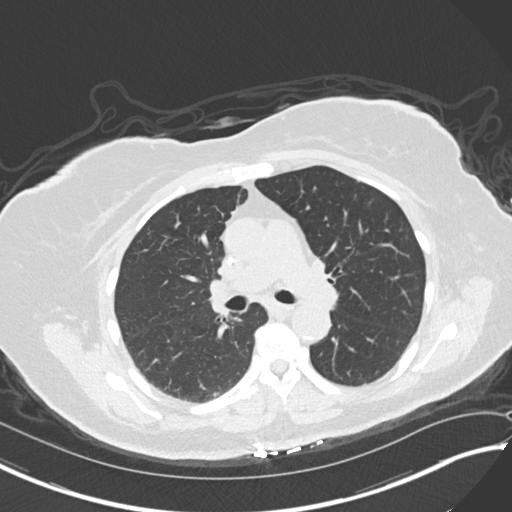
[im 92/149  lung]
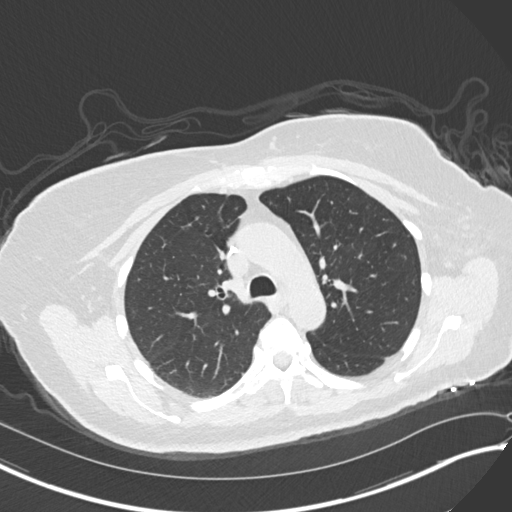
[im 103/149  mediastinal]
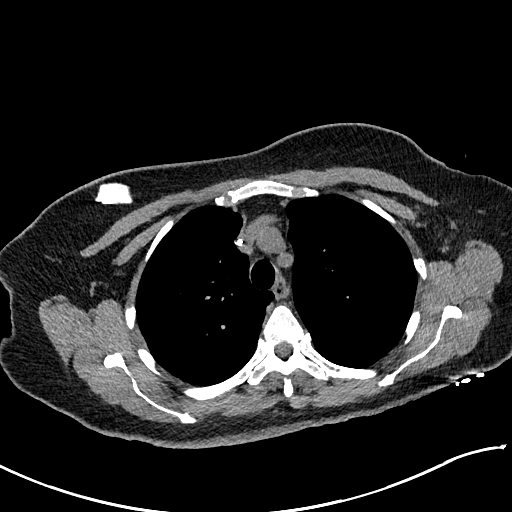
[im 103/149  lung]
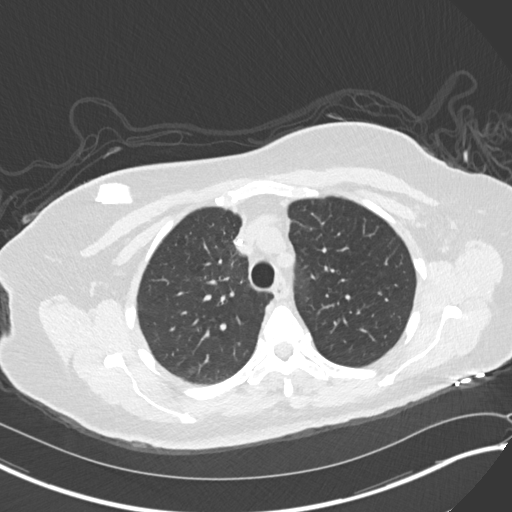
[im 114/149  lung]
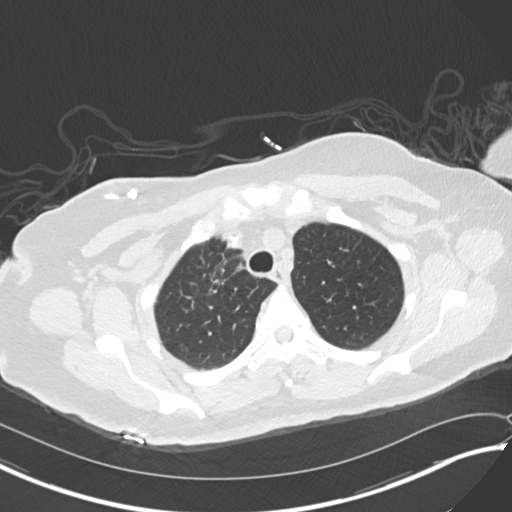
[im 126/149  lung]
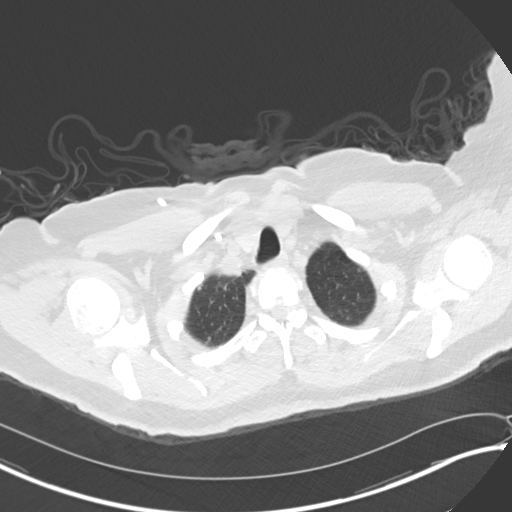
[im 137/149  lung]
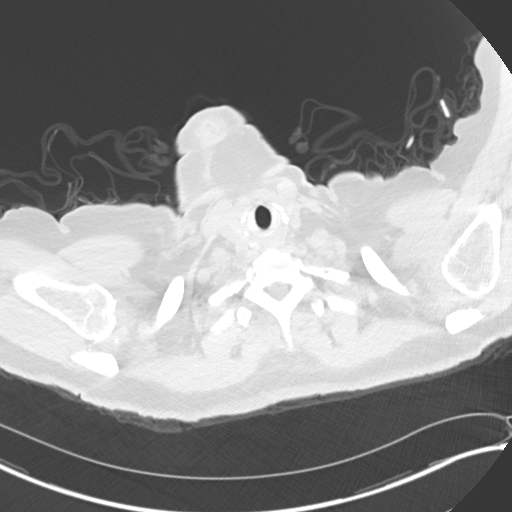

[Series 6: coronal · coronal · 0.60mm/px · 3 of 126 slices shown]
[im 26/126  lung]
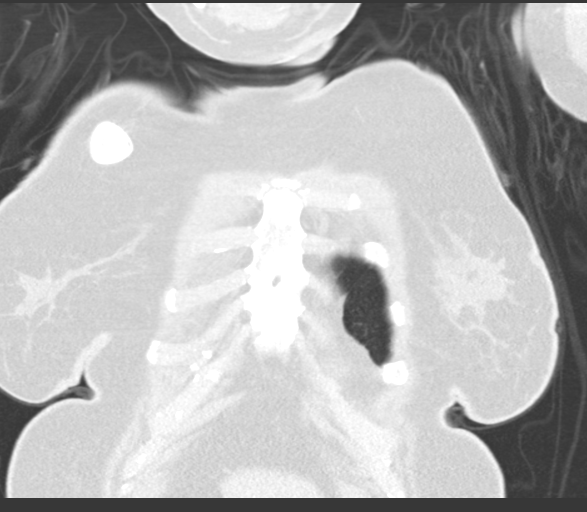
[im 51/126  lung]
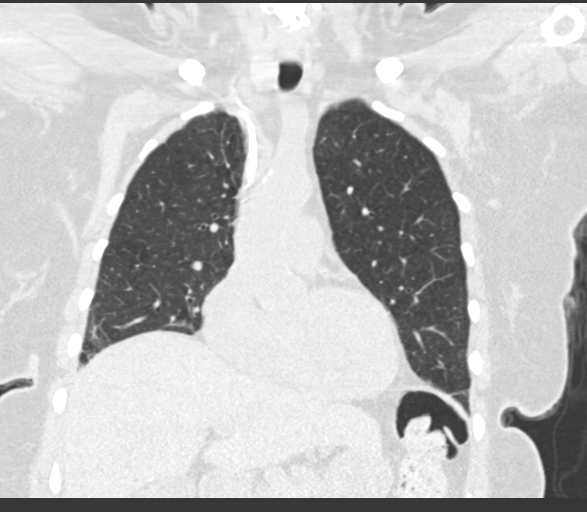
[im 76/126  lung]
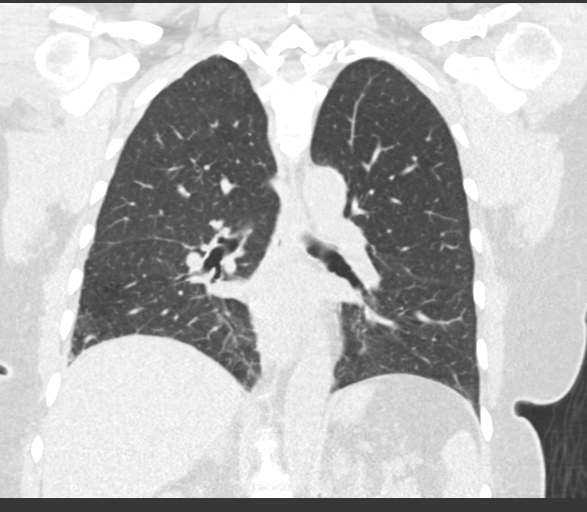

[15 of 36 positions shown; findings below may reference images not displayed]

FINDINGS: Cardiovascular: Heart size appears within normal limits. Aortic
atherosclerosis. Coronary artery atherosclerotic calcifications.

Mediastinum/Nodes: Normal appearance of the thyroid gland. The
trachea appears patent and is midline. Normal appearance of the
esophagus. No mediastinal adenopathy. No enlarged axillary,
supraclavicular adenopathy. Previous FDG avid left axillary lymph
nodes appear decreased in size from previous exam. Index left
axillary lymph node measures 0.5 cm, image 61/2. Formally 0.9 cm.
Previous lateral left retropectoral lymph node measures 4 mm, image
54/2. Formally 1 cm. The FDG avid left breast mass containing biopsy
clip measures 2.7 x 1.7 cm. Previously 3.3 by 2.7 cm.

Lungs/Pleura: No acute findings. Chronic bronchiectasis is
identified within the posterior and lateral right lower lobe.
Previous peripheral nodule in the right middle lobe measures 0.5 cm,
image 86/5. Formally 0.7 cm. Previous subpleural nodule in the
superior segment of right lower lobe measures 0.4 cm formally this
measured the same. No new or progressive disease.

Upper Abdomen: No acute abnormality. Upper pole left kidney cyst is
again noted measuring 2.8 cm. Aortic atherosclerosis.

Musculoskeletal: No acute or suspicious osseous findings.
IMPRESSION: 1. Interval response to therapy. The left breast mass has mildly
decreased in size in the interval.
2. Decrease in size of left axillary and left retropectoral lymph
nodes.
3. Small right lung nodules stable to improved.
4. Coronary artery calcifications noted.
5. Aortic Atherosclerosis ([R9]-[R9]).

## 2020-12-04 ENCOUNTER — Inpatient Hospital Stay: Payer: Medicare Other | Admitting: Hematology

## 2020-12-04 ENCOUNTER — Ambulatory Visit: Payer: Medicare Other

## 2020-12-04 ENCOUNTER — Other Ambulatory Visit: Payer: Medicare Other

## 2020-12-04 ENCOUNTER — Inpatient Hospital Stay: Payer: Medicare Other

## 2020-12-04 ENCOUNTER — Ambulatory Visit: Payer: Medicare Other | Admitting: Hematology

## 2020-12-07 ENCOUNTER — Telehealth: Payer: Self-pay | Admitting: Hematology

## 2020-12-07 NOTE — Telephone Encounter (Signed)
Sch per 9/30 pt request

## 2020-12-08 ENCOUNTER — Inpatient Hospital Stay: Payer: Medicare Other

## 2020-12-08 ENCOUNTER — Telehealth: Payer: Self-pay

## 2020-12-08 ENCOUNTER — Inpatient Hospital Stay: Payer: Medicare Other | Admitting: Hematology

## 2020-12-08 NOTE — Telephone Encounter (Signed)
This nurse attempted to reach out to patient related to missed port flush and infusion.  There was no answer and nurse unable to leave a message.  Contacted scheduling and was notified that patient called yesterday evening to say that her appointment time was to early.  Will attempt to reach patient again.

## 2020-12-09 ENCOUNTER — Telehealth: Payer: Self-pay | Admitting: Hematology

## 2020-12-09 NOTE — Telephone Encounter (Signed)
Sch per 9/30 pt request ,pt aware

## 2020-12-16 ENCOUNTER — Other Ambulatory Visit: Payer: Self-pay

## 2020-12-16 ENCOUNTER — Inpatient Hospital Stay: Payer: Medicare Other | Attending: Nurse Practitioner

## 2020-12-16 ENCOUNTER — Inpatient Hospital Stay (HOSPITAL_BASED_OUTPATIENT_CLINIC_OR_DEPARTMENT_OTHER): Payer: Medicare Other | Admitting: Hematology

## 2020-12-16 ENCOUNTER — Inpatient Hospital Stay: Payer: Medicare Other

## 2020-12-16 ENCOUNTER — Encounter: Payer: Self-pay | Admitting: Hematology

## 2020-12-16 VITALS — BP 159/87 | HR 67 | Temp 97.8°F | Resp 18 | Ht 64.0 in | Wt 174.3 lb

## 2020-12-16 DIAGNOSIS — K5900 Constipation, unspecified: Secondary | ICD-10-CM | POA: Insufficient documentation

## 2020-12-16 DIAGNOSIS — C50812 Malignant neoplasm of overlapping sites of left female breast: Secondary | ICD-10-CM | POA: Insufficient documentation

## 2020-12-16 DIAGNOSIS — I6381 Other cerebral infarction due to occlusion or stenosis of small artery: Secondary | ICD-10-CM | POA: Insufficient documentation

## 2020-12-16 DIAGNOSIS — Z171 Estrogen receptor negative status [ER-]: Secondary | ICD-10-CM | POA: Insufficient documentation

## 2020-12-16 DIAGNOSIS — Z79899 Other long term (current) drug therapy: Secondary | ICD-10-CM | POA: Insufficient documentation

## 2020-12-16 DIAGNOSIS — I6782 Cerebral ischemia: Secondary | ICD-10-CM | POA: Diagnosis not present

## 2020-12-16 DIAGNOSIS — C7800 Secondary malignant neoplasm of unspecified lung: Secondary | ICD-10-CM | POA: Diagnosis not present

## 2020-12-16 DIAGNOSIS — I2510 Atherosclerotic heart disease of native coronary artery without angina pectoris: Secondary | ICD-10-CM | POA: Diagnosis not present

## 2020-12-16 DIAGNOSIS — I700 Atherosclerosis of aorta: Secondary | ICD-10-CM | POA: Diagnosis not present

## 2020-12-16 DIAGNOSIS — M549 Dorsalgia, unspecified: Secondary | ICD-10-CM | POA: Diagnosis not present

## 2020-12-16 DIAGNOSIS — C50412 Malignant neoplasm of upper-outer quadrant of left female breast: Secondary | ICD-10-CM | POA: Diagnosis not present

## 2020-12-16 DIAGNOSIS — R634 Abnormal weight loss: Secondary | ICD-10-CM | POA: Diagnosis not present

## 2020-12-16 DIAGNOSIS — G8929 Other chronic pain: Secondary | ICD-10-CM | POA: Insufficient documentation

## 2020-12-16 DIAGNOSIS — C773 Secondary and unspecified malignant neoplasm of axilla and upper limb lymph nodes: Secondary | ICD-10-CM | POA: Diagnosis not present

## 2020-12-16 DIAGNOSIS — M4800 Spinal stenosis, site unspecified: Secondary | ICD-10-CM | POA: Diagnosis not present

## 2020-12-16 DIAGNOSIS — Z5112 Encounter for antineoplastic immunotherapy: Secondary | ICD-10-CM | POA: Insufficient documentation

## 2020-12-16 DIAGNOSIS — Z95828 Presence of other vascular implants and grafts: Secondary | ICD-10-CM

## 2020-12-16 DIAGNOSIS — I129 Hypertensive chronic kidney disease with stage 1 through stage 4 chronic kidney disease, or unspecified chronic kidney disease: Secondary | ICD-10-CM | POA: Insufficient documentation

## 2020-12-16 DIAGNOSIS — E1122 Type 2 diabetes mellitus with diabetic chronic kidney disease: Secondary | ICD-10-CM | POA: Insufficient documentation

## 2020-12-16 DIAGNOSIS — Z803 Family history of malignant neoplasm of breast: Secondary | ICD-10-CM | POA: Insufficient documentation

## 2020-12-16 LAB — CMP (CANCER CENTER ONLY)
ALT: 16 U/L (ref 0–44)
AST: 28 U/L (ref 15–41)
Albumin: 3.6 g/dL (ref 3.5–5.0)
Alkaline Phosphatase: 54 U/L (ref 38–126)
Anion gap: 8 (ref 5–15)
BUN: 19 mg/dL (ref 8–23)
CO2: 25 mmol/L (ref 22–32)
Calcium: 10.6 mg/dL — ABNORMAL HIGH (ref 8.9–10.3)
Chloride: 114 mmol/L — ABNORMAL HIGH (ref 98–111)
Creatinine: 1.76 mg/dL — ABNORMAL HIGH (ref 0.44–1.00)
GFR, Estimated: 29 mL/min — ABNORMAL LOW (ref >60)
Glucose, Bld: 95 mg/dL (ref 70–99)
Potassium: 4.2 mmol/L (ref 3.5–5.1)
Sodium: 147 mmol/L — ABNORMAL HIGH (ref 135–145)
Total Bilirubin: 0.4 mg/dL (ref 0.3–1.2)
Total Protein: 7.5 g/dL (ref 6.5–8.1)

## 2020-12-16 LAB — CBC WITH DIFFERENTIAL (CANCER CENTER ONLY)
Abs Immature Granulocytes: 0.01 10*3/uL (ref 0.00–0.07)
Basophils Absolute: 0 10*3/uL (ref 0.0–0.1)
Basophils Relative: 1 %
Eosinophils Absolute: 0.3 10*3/uL (ref 0.0–0.5)
Eosinophils Relative: 5 %
HCT: 33.3 % — ABNORMAL LOW (ref 36.0–46.0)
Hemoglobin: 10.9 g/dL — ABNORMAL LOW (ref 12.0–15.0)
Immature Granulocytes: 0 %
Lymphocytes Relative: 34 %
Lymphs Abs: 2.1 10*3/uL (ref 0.7–4.0)
MCH: 24 pg — ABNORMAL LOW (ref 26.0–34.0)
MCHC: 32.7 g/dL (ref 30.0–36.0)
MCV: 73.2 fL — ABNORMAL LOW (ref 80.0–100.0)
Monocytes Absolute: 0.4 10*3/uL (ref 0.1–1.0)
Monocytes Relative: 6 %
Neutro Abs: 3.4 10*3/uL (ref 1.7–7.7)
Neutrophils Relative %: 54 %
Platelet Count: 250 10*3/uL (ref 150–400)
RBC: 4.55 MIL/uL (ref 3.87–5.11)
RDW: 16.2 % — ABNORMAL HIGH (ref 11.5–15.5)
WBC Count: 6.1 10*3/uL (ref 4.0–10.5)
nRBC: 0 % (ref 0.0–0.2)

## 2020-12-16 MED ORDER — ACETAMINOPHEN 325 MG PO TABS
650.0000 mg | ORAL_TABLET | Freq: Once | ORAL | Status: AC
  Administered 2020-12-16: 650 mg via ORAL
  Filled 2020-12-16: qty 2

## 2020-12-16 MED ORDER — SODIUM CHLORIDE 0.9 % IV SOLN: Freq: Once | INTRAVENOUS | Status: AC

## 2020-12-16 MED ORDER — DIPHENHYDRAMINE HCL 25 MG PO CAPS
50.0000 mg | ORAL_CAPSULE | Freq: Once | ORAL | Status: AC
  Administered 2020-12-16: 50 mg via ORAL
  Filled 2020-12-16: qty 2

## 2020-12-16 MED ORDER — SODIUM CHLORIDE 0.9 % IV SOLN
240.0000 mg | Freq: Once | INTRAVENOUS | Status: AC
  Administered 2020-12-16: 240 mg via INTRAVENOUS
  Filled 2020-12-16: qty 8

## 2020-12-16 MED ORDER — HEPARIN SOD (PORK) LOCK FLUSH 100 UNIT/ML IV SOLN
500.0000 [IU] | Freq: Once | INTRAVENOUS | Status: AC | PRN
  Administered 2020-12-16: 500 [IU]

## 2020-12-16 MED ORDER — SODIUM CHLORIDE 0.9% FLUSH
10.0000 mL | INTRAVENOUS | Status: DC | PRN
  Administered 2020-12-16: 10 mL

## 2020-12-16 MED ORDER — SODIUM CHLORIDE 0.9% FLUSH
10.0000 mL | Freq: Once | INTRAVENOUS | Status: AC
  Administered 2020-12-16: 10 mL

## 2020-12-16 NOTE — Patient Instructions (Signed)
[facility] ONCOLOGY   Discharge Instructions: Thank you for choosing [facility] to provide your oncology and hematology care.   If you have a lab appointment with the [facility], please go directly to the [facility] and check in at the registration area.   Wear comfortable clothing and clothing appropriate for easy access to any Portacath or PICC line.   We strive to give you quality time with your provider. You may need to reschedule your appointment if you arrive late (15 or more minutes).  Arriving late affects you and other patients whose appointments are after yours.  Also, if you miss three or more appointments without notifying the office, you may be dismissed from the clinic at the provider's discretion.      For prescription refill requests, have your pharmacy contact our office and allow 72 hours for refills to be completed.    Today you received the following chemotherapy and/or immunotherapy agents Trastuzumab Emtansine (Kadcyla).      To help prevent nausea and vomiting after your treatment, we encourage you to take your nausea medication as directed.  BELOW ARE SYMPTOMS THAT SHOULD BE REPORTED IMMEDIATELY: *FEVER GREATER THAN 100.4 F (38 C) OR HIGHER *CHILLS OR SWEATING *NAUSEA AND VOMITING THAT IS NOT CONTROLLED WITH YOUR NAUSEA MEDICATION *UNUSUAL SHORTNESS OF BREATH *UNUSUAL BRUISING OR BLEEDING *URINARY PROBLEMS (pain or burning when urinating, or frequent urination) *BOWEL PROBLEMS (unusual diarrhea, constipation, pain near the anus) TENDERNESS IN MOUTH AND THROAT WITH OR WITHOUT PRESENCE OF ULCERS (sore throat, sores in mouth, or a toothache) UNUSUAL RASH, SWELLING OR PAIN  UNUSUAL VAGINAL DISCHARGE OR ITCHING   Items with * indicate a potential emergency and should be followed up as soon as possible or go to the Emergency Department if any problems should occur.  Please show the CHEMOTHERAPY ALERT CARD or IMMUNOTHERAPY  ALERT CARD at check-in to the Emergency Department and triage nurse.  Should you have questions after your visit or need to cancel or reschedule your appointment, please contact [facility]  Dept: [phone]  and follow the prompts.  Office hours are 8:00 a.m. to 4:30 p.m. Monday - Friday. Please note that voicemails left after 4:00 p.m. may not be returned until the following business day.  We are closed weekends and major holidays. You have access to a nurse at all times for urgent questions. Please call the main number to the clinic Dept: [phone] and follow the prompts.   For any non-urgent questions, you may also contact your provider using MyChart. We now offer e-Visits for anyone 83 and older to request care online for non-urgent symptoms. For details visit [email or web address].   Also download the MyChart app! Go to the app store, search "MyChart", open the app, select [facility], and log in with your MyChart username and password.  Due to Covid, a mask is required upon entering the hospital/clinic. If you do not have a mask, one will be given to you upon arrival. For doctor visits, patients may have 1 support person aged 8 or older with them. For treatment visits, patients cannot have anyone with them due to current Covid guidelines and our immunocompromised population.

## 2020-12-16 NOTE — Progress Notes (Signed)
Christy [name]   [phone] Fax:([phone]) [phone]   Clinic Follow up Note   Patient Care Team: Nolene Ebbs, MD as PCP - General (Internal Medicine) Mauro Kaufmann, RN as Oncology Nurse Navigator Rockwell Germany, RN as Oncology Nurse Navigator  Date of Service:  12/16/2020  CHIEF COMPLAINT: f/u of metastatic breast cancer  CURRENT THERAPY:  Kadcyla every 3 weeks, started on 09/11/2020  ASSESSMENT & PLAN:  Christy [name] is a 80 y.o. female with   1. Malignant neoplasm of overlapping sites in the central and upper out quadrant left breast, invasive ductal carcinoma, cT2N3M1 stage IV, grade 2-3, ER/PR negative HER2 positive, MIB-1 of 20% - with nodal and pulmonary metastases -She initially self palpated a left breast mass in 06/2020, possibly earlier.  Work-up showed 2 masses in the left upper outer breast and 3 abnormal lymph nodes.  Biopsy of the 2 masses and 1 lymph node all showed invasive ductal carcinoma, grade 2-3, ER/PR negative and HER2 positive. -PET scan 08/28/20 shows the hypermetabolic known left breast malignancy with hypermetabolic left axillary, retropectoral, and supraclavicular adenopathy.  Additionally there are several small 3-6 mm pulmonary nodules with mild hypermetabolic activity, felt to be consistent with pulmonary metastasis.  -baseline labs which shows CKD and mild anemia, CA 27.29 is normal. Baseline echo EF 55-60% -She was started on first-line systemic Kadcyla, given once every 3 weeks, on 09/11/20. She tolerates well with some fatigue. -restaging chest CT 11/30/20 showed positive response to therapy, with decrease in size of left breast mass, lymph nodes, and right lung nodules. I reviewed with the patient and her daughter today. -I decreased her Kadcyla dose with C4 due to fatigue. Labs reviewed, overall adequate for treatment. -she is due for repeat echo. I will order today.   2. Symptom management: Constipation, difficulty urinating,  diminished appetite/weight loss, fatigue -chronic, but worsening -has not had a bowel movement in almost a week. She tried OTC medication but only took one a day. It did not help. I recommend either increasing the dose or using Magnesium Citrate. I again told her to increase her miralax to twice a day. -also reports only urinating once a day, despite feeling the need to go -she does not eat enough. Given her DM, I recommend she use Glucerna for nutritional supplement.   3. Genetics -Her sister had metastatic breast cancer in her 23s, and that sisters daughter possibly have breast cancer. -Due to family history and ER/PR negative disease, patient qualifies for genetics. She has not been evaluated or tested yet. -Patient's daughter understands her breast cancer risk and the importance of annual mammography.   4. Spinal stenosis -Reportedly diagnosed in [place], [place].  Patient has chronic back pain and left leg weakness at baseline -She now also has right leg weakness. She is ambulating with a walker. -PET is negative for spinal mets and brain MRI shows subacute lacunar infarct without metastatic disease    5. HTN, DM -on metformin, amlodipine, and olmesartan -f/up PCP Dr. Jeanie Cooks -Denies baseline neuropathy -her BP is elevated today. She is unsure what she is taking.   6. Subacute stroke -Her brain MRI showed subacute lacunar infarct of the left basal ganglia, she has no clinical symptoms of stroke. -She was seen by Dr. Mickeal Skinner on 09/17/20. He recommended cholesterol medicine and full-dose aspirin. She was prescribed lipitor at that time.     PLAN: -proceed with C5 Kadcyla with same dose reduction to 63m/kg -increase miralax dose for constipation  -  repeat echo in next few weeks  -labs, flush, f/u, and Kadcyla in 3 and 7 weeks    No problem-specific Assessment & Plan notes found for this encounter.   SUMMARY OF ONCOLOGIC HISTORY: Oncology History Overview Note  Cancer  Staging Malignant neoplasm of upper-outer quadrant of female breast ([facility]) Staging form: Breast, AJCC 8th Edition - Clinical stage from 07/31/2020: Stage IV (cT2, cN3, cM1, G2, ER-, PR-, HER2+) - Signed by Alla Feeling, NP on 08/31/2020 Stage prefix: Initial diagnosis Nuclear grade: G2 Histologic grading system: 3 grade system    Malignant neoplasm of upper-outer quadrant of female breast ([facility])  07/24/2020 Breast US   On physical exam, a very firm mass is identified at the 1:30 position of the LEFT breast 5 cm from the nipple.   IMPRESSION: 1. Highly suspicious 4.3 cm mass at the 1:30 position of the LEFT breast and highly suspicious 2.7 cm mass at the 2 o'clock position of the LEFT breast, with the 2 masses encompassing an area measuring at least 6.1 cm. 2. 3 abnormal LEFT axillary lymph nodes with cortical thickening, tissue sampling of 1 of these lymph nodes recommended. 3. Anterior LEFT breast skin thickening nonspecific but may reflect dermal involvement. 4. No mammographic evidence of RIGHT breast malignancy.   07/31/2020 Initial Biopsy   Diagnosis 1. Breast, left, needle core biopsy, 1 :30 pm, 5cmfn - INVASIVE DUCTAL CARCINOMA, SEE COMMENT. 2. Breast, left, needle core biopsy, 2 o'clock, 8cmfn - INVASIVE DUCTAL CARCINOMA, SEE COMMENT. - DUCTAL CARCINOMA IN SITU. 3. Lymph node, needle/core biopsy, left axillary - METASTATIC CARCINOMA IN A LYMPH NODE. Microscopic Comment 1. and 3. The carcinoma in both parts has a similar morphology and appears grade 2-3. The DCIS in part 2 is high-grade with necrosis. The carcinoma measures 15 mm (part 1) and 14 mm (part 2) in greatest linear extent  2. PROGNOSTIC INDICATORS Results: IMMUNOHISTOCHEMICAL AND MORPHOMETRIC ANALYSIS PERFORMED MANUALLY The tumor cells are POSITIVE for Her2 (3+). Estrogen Receptor: 0%, NEGATIVE Progesterone Receptor: 0%, NEGATIVE Proliferation Marker Ki67: 20%    07/31/2020 Cancer Staging   Staging form:  Breast, AJCC 8th Edition - Clinical stage from 07/31/2020: Stage IV (cT2, cN3, cM1, G2, ER-, PR-, HER2+) - Signed by Alla Feeling, NP on 08/31/2020 Stage prefix: Initial diagnosis Nuclear grade: G2 Histologic grading system: 3 grade system   08/13/2020 Initial Diagnosis   Malignant neoplasm of upper-outer quadrant of female breast ([facility])   08/28/2020 PET scan   IMPRESSION: Signs of LEFT breast cancer with LEFT axillary and retropectoral adenopathy as well as LEFT supraclavicular adenopathy.   Signs of pulmonary metastatic disease.   Mild asymmetric uptake in the LEFT as compared to the RIGHT adrenal gland. Equivocal but suspicious, attention on follow-up.   Aortic atherosclerosis.   Cystic changes and potential bronchiectasis in the RIGHT lung base likely post infectious or inflammatory. Correlate with any respiratory symptoms. Comparison with prior imaging may be helpful with attention on follow-up.   08/28/2020 Imaging   BRAIN MRI IMPRESSION: 1. Subacute lacunar infarct of the left basal ganglia with no malignant hemorrhagic transformation or mass effect. Underlying chronic bilateral basal ganglia ischemia. 2. No metastatic disease or other acute intracranial abnormality identified.   08/28/2020 Imaging   Breast MRI   IMPRESSION: 1. 8.1 x 8.1 x 5.1 cm area of biopsy-proven malignancy in the central, upper outer and lower outer quadrants of the left breast. 2. Diffuse anterior left breast skin thickening without abnormal enhancement. This may represent thickening due to  lymphedema associated with lymphatic obstruction. 3. Metastatic level 1 and level 2 left axillary adenopathy. 4. No evidence of malignancy on the right.   09/11/2020 -  Chemotherapy   Patient is on Treatment Plan : BREAST ADO-Trastuzumab Emtansine (Kadcyla) q21d     11/30/2020 Imaging   CT Chest w/o contrast  IMPRESSION: 1. Interval response to therapy. The left breast mass has mildly decreased in size in  the interval. 2. Decrease in size of left axillary and left retropectoral lymph nodes. 3. Small right lung nodules stable to improved. 4. Coronary artery calcifications noted. 5. Aortic Atherosclerosis (ICD10-I70.0).      INTERVAL HISTORY:  Christy [name] is here for a follow up of metastatic breast cancer. She was last seen by me on 11/13/20. She presents to the clinic accompanied by her daughter. She has lost some weight. Her daughter feels she is eating well. "She eats grapes. A lot! She eats waffles, bacon, and eggs. She likes those." She reports Ghazal is still having stomach issues with constipation. Patient reports she is using miralax only once a day. She notes she had some itching to her arm, but this has resolved today.   All other systems were reviewed with the patient and are negative.  MEDICAL HISTORY:  Past Medical History:  Diagnosis Date   Diabetes mellitus without complication ([facility])    Hyperlipidemia    Hypertension     SURGICAL HISTORY: Past Surgical History:  Procedure Laterality Date   FOOT FRACTURE SURGERY Right    IR IMAGING GUIDED PORT INSERTION  09/22/2020    I have reviewed the social history and family history with the patient and they are unchanged from previous note.  ALLERGIES:  has No Known Allergies.  MEDICATIONS:  Current Outpatient Medications  Medication Sig Dispense Refill   amLODipine (NORVASC) 5 MG tablet  (Patient not taking: Reported on 09/17/2020)     aspirin EC 325 MG tablet Take 1 tablet (325 mg total) by mouth daily. 30 tablet 5   atorvastatin (LIPITOR) 20 MG tablet Take 1 tablet (20 mg total) by mouth daily. 30 tablet 5   cetirizine (ZYRTEC ALLERGY) 10 MG tablet Take 1 tablet (10 mg total) by mouth daily. (Patient not taking: Reported on 09/17/2020) 30 tablet 0   clotrimazole (CLOTRIMAZOLE AF) 1 % cream Apply 1 application topically 2 (two) times daily. 30 g 0   fluticasone (FLONASE) 50 MCG/ACT nasal spray Place 1 spray into  both nostrils daily. (Patient not taking: Reported on 09/17/2020) 1 g 0   lidocaine-prilocaine (EMLA) cream Apply 1 application topically as needed. 30 g 2   metFORMIN (GLUCOPHAGE) 500 MG tablet Take 1 tablet (500 mg total) by mouth 2 (two) times daily with a meal. 60 tablet 1   olmesartan (BENICAR) 40 MG tablet      No current facility-administered medications for this visit.    PHYSICAL EXAMINATION: ECOG PERFORMANCE STATUS: 2 - Symptomatic, <50% confined to bed  Vitals:   12/16/20 1238  BP: (!) 159/87  Pulse: 67  Resp: 18  Temp: 97.8 F (36.6 C)  SpO2: 100%   Wt Readings from Last 3 Encounters:  12/16/20 174 lb 4.8 oz (79.1 kg)  11/13/20 177 lb 3.2 oz (80.4 kg)  10/23/20 183 lb 1.6 oz (83.1 kg)     GENERAL:alert, no distress and comfortable SKIN: skin color, texture, turgor are normal, no rashes or significant lesions EYES: normal, Conjunctiva are pink and non-injected, sclera clear  NECK: supple, thyroid normal size, non-tender, without  nodularity LYMPH:  no palpable lymphadenopathy in the cervical, axillary  LUNGS: clear to auscultation and percussion with normal breathing effort HEART: regular rate & rhythm and no murmurs and no lower extremity edema ABDOMEN:abdomen soft, non-tender and normal bowel sounds Musculoskeletal:no cyanosis of digits and no clubbing  NEURO: alert & oriented x 3 with fluent speech, no focal motor/sensory deficits BREAST: 2.5 x 1.5 cm at 1 o'clock position of left breast. No palpable adenopathy bilaterally.   LABORATORY DATA:  I have reviewed the data as listed CBC Latest Ref Rng & Units 12/16/2020 11/13/2020 10/23/2020  WBC 4.0 - 10.5 K/uL 6.1 5.4 5.7  Hemoglobin 12.0 - 15.0 g/dL 10.9(L) 10.7(L) 10.5(L)  Hematocrit 36.0 - 46.0 % 33.3(L) 32.3(L) 32.1(L)  Platelets 150 - 400 K/uL 250 262 248     CMP Latest Ref Rng & Units 12/16/2020 11/13/2020 10/23/2020  Glucose 70 - 99 mg/dL 95 98 118(H)  BUN 8 - 23 mg/dL _0 Creatinine 0.44 - 1.00  mg/dL 1.76(H) 1.80(H) 1.73(H)  Sodium 135 - 145 mmol/L 147(H) 145 145  Potassium 3.5 - 5.1 mmol/L 4.2 4.0 4.0  Chloride 98 - 111 mmol/L 114(H) 109 109  CO2 22 - 32 mmol/L _1 Calcium 8.9 - 10.3 mg/dL 10.6(H) 10.6(H) 10.3  Total Protein 6.5 - 8.1 g/dL 7.5 7.2 6.9  Total Bilirubin 0.3 - 1.2 mg/dL 0.4 0.4 0.4  Alkaline Phos 38 - 126 U/L 54 63 64  AST 15 - 41 U/L _2 ALT 0 - 44 U/L _3 RADIOGRAPHIC STUDIES: I have personally reviewed the radiological images as listed and agreed with the findings in the report. No results found.    Orders Placed This Encounter  Procedures   ECHOCARDIOGRAM COMPLETE    Standing Status:   Future    Standing Expiration Date:   12/16/2021    Order Specific Question:   Where should this test be performed    Answer:   [facility]    Order Specific Question:   Perflutren DEFINITY (image enhancing agent) should be administered unless hypersensitivity or allergy exist    Answer:   Administer Perflutren    Order Specific Question:   Reason for exam-Echo    Answer:   Chemo  Z09   All questions were answered. The patient knows to call the clinic with any problems, questions or concerns. No barriers to learning was detected. The total time spent in the appointment was 30 minutes.     Truitt Merle, MD 12/16/2020   I, Wilburn Mylar, am acting as scribe for Truitt Merle, MD.   I have reviewed the above documentation for accuracy and completeness, and I agree with the above.

## 2020-12-16 NOTE — Progress Notes (Signed)
Per Dr. Burr Medico, ok to treat with elevated scr.  Patient was observed for 30 minutes following Kadcyla infusion with no adverse reaction.

## 2020-12-17 ENCOUNTER — Telehealth: Payer: Self-pay | Admitting: Hematology

## 2020-12-17 LAB — CANCER ANTIGEN 27.29: CA 27.29: 25.9 U/mL (ref 0.0–38.6)

## 2020-12-17 NOTE — Telephone Encounter (Signed)
Left message with follow-up appointments per 10/12 los.

## 2020-12-24 ENCOUNTER — Ambulatory Visit (HOSPITAL_COMMUNITY)
Admission: RE | Admit: 2020-12-24 | Discharge: 2020-12-24 | Disposition: A | Discharge status: Home / Self Care | Payer: Medicare Other | Source: Ambulatory Visit | Attending: Hematology | Admitting: Hematology

## 2020-12-24 ENCOUNTER — Other Ambulatory Visit: Payer: Self-pay

## 2020-12-24 DIAGNOSIS — Z0189 Encounter for other specified special examinations: Secondary | ICD-10-CM

## 2020-12-24 DIAGNOSIS — Z171 Estrogen receptor negative status [ER-]: Secondary | ICD-10-CM

## 2020-12-24 DIAGNOSIS — C50412 Malignant neoplasm of upper-outer quadrant of left female breast: Secondary | ICD-10-CM | POA: Diagnosis not present

## 2020-12-24 DIAGNOSIS — Z8673 Personal history of transient ischemic attack (TIA), and cerebral infarction without residual deficits: Secondary | ICD-10-CM | POA: Insufficient documentation

## 2020-12-24 DIAGNOSIS — Z01810 Encounter for preprocedural cardiovascular examination: Secondary | ICD-10-CM | POA: Diagnosis not present

## 2020-12-24 LAB — ECHOCARDIOGRAM COMPLETE
Area-P 1/2: 2.8 cm2
Calc EF: 55.4 %
S' Lateral: 2.5 cm
Single Plane A2C EF: 53.7 %
Single Plane A4C EF: 59.9 %

## 2020-12-24 NOTE — Progress Notes (Signed)
  Echocardiogram 2D Echocardiogram has been performed.  Christy [name] 12/24/2020, 10:43 AM

## 2020-12-31 ENCOUNTER — Encounter: Payer: Self-pay | Admitting: Radiology

## 2021-01-05 ENCOUNTER — Inpatient Hospital Stay: Payer: Medicare Other

## 2021-01-05 ENCOUNTER — Encounter: Payer: Self-pay | Admitting: Hematology

## 2021-01-05 ENCOUNTER — Other Ambulatory Visit: Payer: Self-pay

## 2021-01-05 ENCOUNTER — Inpatient Hospital Stay: Payer: Medicare Other | Attending: Nurse Practitioner

## 2021-01-05 ENCOUNTER — Inpatient Hospital Stay (HOSPITAL_BASED_OUTPATIENT_CLINIC_OR_DEPARTMENT_OTHER): Payer: Medicare Other | Admitting: Hematology

## 2021-01-05 VITALS — BP 188/72 | HR 62 | Temp 98.1°F | Resp 17 | Wt 172.6 lb

## 2021-01-05 VITALS — BP 154/57 | HR 63 | Temp 98.7°F | Resp 18

## 2021-01-05 DIAGNOSIS — I700 Atherosclerosis of aorta: Secondary | ICD-10-CM | POA: Insufficient documentation

## 2021-01-05 DIAGNOSIS — I129 Hypertensive chronic kidney disease with stage 1 through stage 4 chronic kidney disease, or unspecified chronic kidney disease: Secondary | ICD-10-CM | POA: Diagnosis not present

## 2021-01-05 DIAGNOSIS — Z171 Estrogen receptor negative status [ER-]: Secondary | ICD-10-CM

## 2021-01-05 DIAGNOSIS — C50412 Malignant neoplasm of upper-outer quadrant of left female breast: Secondary | ICD-10-CM | POA: Diagnosis not present

## 2021-01-05 DIAGNOSIS — Z5112 Encounter for antineoplastic immunotherapy: Secondary | ICD-10-CM | POA: Diagnosis present

## 2021-01-05 DIAGNOSIS — D631 Anemia in chronic kidney disease: Secondary | ICD-10-CM | POA: Diagnosis not present

## 2021-01-05 DIAGNOSIS — Z803 Family history of malignant neoplasm of breast: Secondary | ICD-10-CM | POA: Insufficient documentation

## 2021-01-05 DIAGNOSIS — I2510 Atherosclerotic heart disease of native coronary artery without angina pectoris: Secondary | ICD-10-CM | POA: Diagnosis not present

## 2021-01-05 DIAGNOSIS — C50812 Malignant neoplasm of overlapping sites of left female breast: Secondary | ICD-10-CM | POA: Insufficient documentation

## 2021-01-05 DIAGNOSIS — K5900 Constipation, unspecified: Secondary | ICD-10-CM | POA: Diagnosis not present

## 2021-01-05 DIAGNOSIS — G8929 Other chronic pain: Secondary | ICD-10-CM | POA: Insufficient documentation

## 2021-01-05 DIAGNOSIS — E1122 Type 2 diabetes mellitus with diabetic chronic kidney disease: Secondary | ICD-10-CM | POA: Diagnosis not present

## 2021-01-05 DIAGNOSIS — I6782 Cerebral ischemia: Secondary | ICD-10-CM | POA: Insufficient documentation

## 2021-01-05 DIAGNOSIS — N189 Chronic kidney disease, unspecified: Secondary | ICD-10-CM | POA: Diagnosis not present

## 2021-01-05 DIAGNOSIS — I6381 Other cerebral infarction due to occlusion or stenosis of small artery: Secondary | ICD-10-CM | POA: Insufficient documentation

## 2021-01-05 DIAGNOSIS — C7800 Secondary malignant neoplasm of unspecified lung: Secondary | ICD-10-CM | POA: Insufficient documentation

## 2021-01-05 DIAGNOSIS — Z95828 Presence of other vascular implants and grafts: Secondary | ICD-10-CM

## 2021-01-05 DIAGNOSIS — M549 Dorsalgia, unspecified: Secondary | ICD-10-CM | POA: Diagnosis not present

## 2021-01-05 DIAGNOSIS — M4800 Spinal stenosis, site unspecified: Secondary | ICD-10-CM | POA: Insufficient documentation

## 2021-01-05 DIAGNOSIS — R252 Cramp and spasm: Secondary | ICD-10-CM | POA: Insufficient documentation

## 2021-01-05 LAB — CBC WITH DIFFERENTIAL (CANCER CENTER ONLY)
Abs Immature Granulocytes: 0.01 10*3/uL (ref 0.00–0.07)
Basophils Absolute: 0 10*3/uL (ref 0.0–0.1)
Basophils Relative: 1 %
Eosinophils Absolute: 0.3 10*3/uL (ref 0.0–0.5)
Eosinophils Relative: 5 %
HCT: 33 % — ABNORMAL LOW (ref 36.0–46.0)
Hemoglobin: 10.8 g/dL — ABNORMAL LOW (ref 12.0–15.0)
Immature Granulocytes: 0 %
Lymphocytes Relative: 35 %
Lymphs Abs: 2.2 10*3/uL (ref 0.7–4.0)
MCH: 23.9 pg — ABNORMAL LOW (ref 26.0–34.0)
MCHC: 32.7 g/dL (ref 30.0–36.0)
MCV: 73.2 fL — ABNORMAL LOW (ref 80.0–100.0)
Monocytes Absolute: 0.4 10*3/uL (ref 0.1–1.0)
Monocytes Relative: 6 %
Neutro Abs: 3.4 10*3/uL (ref 1.7–7.7)
Neutrophils Relative %: 53 %
Platelet Count: 257 10*3/uL (ref 150–400)
RBC: 4.51 MIL/uL (ref 3.87–5.11)
RDW: 16.3 % — ABNORMAL HIGH (ref 11.5–15.5)
WBC Count: 6.4 10*3/uL (ref 4.0–10.5)
nRBC: 0 % (ref 0.0–0.2)

## 2021-01-05 LAB — CMP (CANCER CENTER ONLY)
ALT: 17 U/L (ref 0–44)
AST: 24 U/L (ref 15–41)
Albumin: 3.3 g/dL — ABNORMAL LOW (ref 3.5–5.0)
Alkaline Phosphatase: 66 U/L (ref 38–126)
Anion gap: 8 (ref 5–15)
BUN: 17 mg/dL (ref 8–23)
CO2: 26 mmol/L (ref 22–32)
Calcium: 9.8 mg/dL (ref 8.9–10.3)
Chloride: 109 mmol/L (ref 98–111)
Creatinine: 1.64 mg/dL — ABNORMAL HIGH (ref 0.44–1.00)
GFR, Estimated: 31 mL/min — ABNORMAL LOW (ref >60)
Glucose, Bld: 100 mg/dL — ABNORMAL HIGH (ref 70–99)
Potassium: 3.9 mmol/L (ref 3.5–5.1)
Sodium: 143 mmol/L (ref 135–145)
Total Bilirubin: 0.4 mg/dL (ref 0.3–1.2)
Total Protein: 7.1 g/dL (ref 6.5–8.1)

## 2021-01-05 MED ORDER — SODIUM CHLORIDE 0.9% FLUSH
10.0000 mL | Freq: Once | INTRAVENOUS | Status: AC
  Administered 2021-01-05: 10 mL

## 2021-01-05 MED ORDER — LIDOCAINE-PRILOCAINE 2.5-2.5 % EX CREA: 1.0000 "application " | TOPICAL_CREAM | CUTANEOUS | 2 refills | Status: DC | PRN

## 2021-01-05 MED ORDER — HEPARIN SOD (PORK) LOCK FLUSH 100 UNIT/ML IV SOLN
500.0000 [IU] | Freq: Once | INTRAVENOUS | Status: AC | PRN
  Administered 2021-01-05: 500 [IU]

## 2021-01-05 MED ORDER — ACETAMINOPHEN 325 MG PO TABS
650.0000 mg | ORAL_TABLET | Freq: Once | ORAL | Status: AC
  Administered 2021-01-05: 650 mg via ORAL
  Filled 2021-01-05: qty 2

## 2021-01-05 MED ORDER — SODIUM CHLORIDE 0.9 % IV SOLN
3.0000 mg/kg | Freq: Once | INTRAVENOUS | Status: AC
  Administered 2021-01-05: 260 mg via INTRAVENOUS
  Filled 2021-01-05: qty 5

## 2021-01-05 MED ORDER — SODIUM CHLORIDE 0.9 % IV SOLN: Freq: Once | INTRAVENOUS | Status: AC

## 2021-01-05 MED ORDER — DIPHENHYDRAMINE HCL 25 MG PO CAPS
50.0000 mg | ORAL_CAPSULE | Freq: Once | ORAL | Status: AC
  Administered 2021-01-05: 50 mg via ORAL
  Filled 2021-01-05: qty 2

## 2021-01-05 MED ORDER — SODIUM CHLORIDE 0.9% FLUSH
10.0000 mL | INTRAVENOUS | Status: DC | PRN
Start: 2021-01-05 — End: 2021-01-05
  Administered 2021-01-05: 10 mL

## 2021-01-05 NOTE — Progress Notes (Signed)
Per Dr. Burr Medico, ok for treatment today with elevated blood pressure and elevate serum creatine level.

## 2021-01-05 NOTE — Patient Instructions (Addendum)
[facility]   Discharge Instructions: Thank you for choosing [facility] to provide your oncology and hematology care.   If you have a lab appointment with the [facility], please go directly to the [facility] and check in at the registration area.   Wear comfortable clothing and clothing appropriate for easy access to any Portacath or PICC line.   We strive to give you quality time with your provider. You may need to reschedule your appointment if you arrive late (15 or more minutes).  Arriving late affects you and other patients whose appointments are after yours.  Also, if you miss three or more appointments without notifying the office, you may be dismissed from the clinic at the provider's discretion.      For prescription refill requests, have your pharmacy contact our office and allow 72 hours for refills to be completed.    Today you received the following chemotherapy and/or immunotherapy agent: Trastuzumab (Kadcyla)   To help prevent nausea and vomiting after your treatment, we encourage you to take your nausea medication as directed.  BELOW ARE SYMPTOMS THAT SHOULD BE REPORTED IMMEDIATELY: *FEVER GREATER THAN 100.4 F (38 C) OR HIGHER *CHILLS OR SWEATING *NAUSEA AND VOMITING THAT IS NOT CONTROLLED WITH YOUR NAUSEA MEDICATION *UNUSUAL SHORTNESS OF BREATH *UNUSUAL BRUISING OR BLEEDING *URINARY PROBLEMS (pain or burning when urinating, or frequent urination) *BOWEL PROBLEMS (unusual diarrhea, constipation, pain near the anus) TENDERNESS IN MOUTH AND THROAT WITH OR WITHOUT PRESENCE OF ULCERS (sore throat, sores in mouth, or a toothache) UNUSUAL RASH, SWELLING OR PAIN  UNUSUAL VAGINAL DISCHARGE OR ITCHING   Items with * indicate a potential emergency and should be followed up as soon as possible or go to the Emergency Department if any problems should occur.  Please show the CHEMOTHERAPY ALERT CARD or IMMUNOTHERAPY ALERT CARD at  check-in to the Emergency Department and triage nurse.  Should you have questions after your visit or need to cancel or reschedule your appointment, please contact [facility]  Dept: [phone]  and follow the prompts.  Office hours are 8:00 a.m. to 4:30 p.m. Monday - Friday. Please note that voicemails left after 4:00 p.m. may not be returned until the following business day.  We are closed weekends and major holidays. You have access to a nurse at all times for urgent questions. Please call the main number to the clinic Dept: [phone] and follow the prompts.   For any non-urgent questions, you may also contact your provider using MyChart. We now offer e-Visits for anyone 87 and older to request care online for non-urgent symptoms. For details visit [email or web address].   Also download the MyChart app! Go to the app store, search "MyChart", open the app, select [facility], and log in with your MyChart username and password.  Due to Covid, a mask is required upon entering the hospital/clinic. If you do not have a mask, one will be given to you upon arrival. For doctor visits, patients may have 1 support person aged 8 or older with them. For treatment visits, patients cannot have anyone with them due to current Covid guidelines and our immunocompromised population.

## 2021-01-05 NOTE — Progress Notes (Signed)
Christy [name]   [phone] [phone]) [phone]   Clinic Follow up Note   Patient Care Team: Nolene Ebbs, MD as PCP - General (Internal Medicine) Mauro Kaufmann, RN as Oncology Nurse Navigator Rockwell Germany, RN as Oncology Nurse Navigator  Date of Service:  01/05/2021  CHIEF COMPLAINT: f/u of metastatic breast cancer  CURRENT THERAPY:  Kadcyla every 3 weeks, started on 09/11/20  ASSESSMENT & PLAN:  Christy [name] is a 80 y.o. female with   1. Malignant neoplasm of overlapping sites in the central and upper out quadrant left breast, invasive ductal carcinoma, cT2N3M1 stage IV, grade 2-3, ER/PR negative HER2 positive, MIB-1 of 20% - with nodal and pulmonary metastases -She initially self palpated a left breast mass in 06/2020, possibly earlier.  Work-up showed 2 masses in the left upper outer breast and 3 abnormal lymph nodes.  Biopsy of the 2 masses and 1 lymph node all showed invasive ductal carcinoma, grade 2-3, ER/PR negative and HER2 positive. -PET scan 08/28/20 shows the hypermetabolic known left breast malignancy with hypermetabolic left axillary, retropectoral, and supraclavicular adenopathy.  Additionally there are several small 3-6 mm pulmonary nodules with mild hypermetabolic activity, felt to be consistent with pulmonary metastasis.  -baseline labs which shows CKD and mild anemia, CA 27.29 is normal. Baseline echo EF 55-60% -She was started on first-line systemic Kadcyla, given once every 3 weeks, on 09/11/20. She tolerates well with some fatigue. -restaging chest CT 11/30/20 showed positive response to therapy, with decrease in size of left breast mass, lymph nodes, and right lung nodules.  -I decreased her Kadcyla dose with C4 due to fatigue.  -repeat echo 12/24/20 was stable. -Labs reviewed, overall adequate for treatment. Her physical exam shows stable left breast mass.   2. Symptom management: Constipation, difficulty urinating, diminished  appetite/weight loss, fatigue -chronic, but worsening -has not had a bowel movement in almost a week. She tried OTC medication but only took one a day. It did not help. I recommend either increasing the dose or using Magnesium Citrate. I again told her to increase her miralax to twice a day. -also reports only urinating once a day, despite feeling the need to go -she does not eat enough. Given her DM, I recommend she use Glucerna for nutritional supplement.   3. Genetics -Her sister had metastatic breast cancer in her 76s, and that sisters daughter possibly have breast cancer. -Due to family history and ER/PR negative disease, patient qualifies for genetics. She has not been evaluated or tested yet. -Patient's daughter understands her breast cancer risk and the importance of annual mammography.   4. Spinal stenosis -Reportedly diagnosed in [place], [place].  Patient has chronic back pain and left leg weakness at baseline -She now also has right leg weakness. She is ambulating with a walker. -PET is negative for spinal mets and brain MRI shows subacute lacunar infarct without metastatic disease    5. HTN, DM -on metformin, amlodipine, and olmesartan -f/up PCP Dr. Jeanie Cooks -Denies baseline neuropathy -her BP is elevated today. She is unsure what she is taking.   6. Subacute stroke -Her brain MRI showed subacute lacunar infarct of the left basal ganglia, she has no clinical symptoms of stroke. -She was seen by Dr. Mickeal Skinner on 09/17/20. He recommended cholesterol medicine and full-dose aspirin. She was prescribed lipitor at that time.     PLAN: -proceed with C6 Kadcyla with same dose reduction to 76m/kg -labs, flush, f/u, and Kadcyla in 3 and 6 weeks  No problem-specific Assessment & Plan notes found for this encounter.   SUMMARY OF ONCOLOGIC HISTORY: Oncology History Overview Note  Cancer Staging Malignant neoplasm of upper-outer quadrant of female breast ([place]) Staging form: Breast,  AJCC 8th Edition - Clinical stage from 07/31/2020: Stage IV (cT2, cN3, cM1, G2, ER-, PR-, HER2+) - Signed by Alla Feeling, NP on 08/31/2020 Stage prefix: Initial diagnosis Nuclear grade: G2 Histologic grading system: 3 grade system    Malignant neoplasm of upper-outer quadrant of female breast ([place])  07/24/2020 Breast US   On physical exam, a very firm mass is identified at the 1:30 position of the LEFT breast 5 cm from the nipple.   IMPRESSION: 1. Highly suspicious 4.3 cm mass at the 1:30 position of the LEFT breast and highly suspicious 2.7 cm mass at the 2 o'clock position of the LEFT breast, with the 2 masses encompassing an area measuring at least 6.1 cm. 2. 3 abnormal LEFT axillary lymph nodes with cortical thickening, tissue sampling of 1 of these lymph nodes recommended. 3. Anterior LEFT breast skin thickening nonspecific but may reflect dermal involvement. 4. No mammographic evidence of RIGHT breast malignancy.   07/31/2020 Initial Biopsy   Diagnosis 1. Breast, left, needle core biopsy, 1 :30 pm, 5cmfn - INVASIVE DUCTAL CARCINOMA, SEE COMMENT. 2. Breast, left, needle core biopsy, 2 o'clock, 8cmfn - INVASIVE DUCTAL CARCINOMA, SEE COMMENT. - DUCTAL CARCINOMA IN SITU. 3. Lymph node, needle/core biopsy, left axillary - METASTATIC CARCINOMA IN A LYMPH NODE. Microscopic Comment 1. and 3. The carcinoma in both parts has a similar morphology and appears grade 2-3. The DCIS in part 2 is high-grade with necrosis. The carcinoma measures 15 mm (part 1) and 14 mm (part 2) in greatest linear extent  2. PROGNOSTIC INDICATORS Results: IMMUNOHISTOCHEMICAL AND MORPHOMETRIC ANALYSIS PERFORMED MANUALLY The tumor cells are POSITIVE for Her2 (3+). Estrogen Receptor: 0%, NEGATIVE Progesterone Receptor: 0%, NEGATIVE Proliferation Marker Ki67: 20%    07/31/2020 Cancer Staging   Staging form: Breast, AJCC 8th Edition - Clinical stage from 07/31/2020: Stage IV (cT2, cN3, cM1, G2, ER-, PR-,  HER2+) - Signed by Alla Feeling, NP on 08/31/2020 Stage prefix: Initial diagnosis Nuclear grade: G2 Histologic grading system: 3 grade system    08/13/2020 Initial Diagnosis   Malignant neoplasm of upper-outer quadrant of female breast ([place])   08/28/2020 PET scan   IMPRESSION: Signs of LEFT breast cancer with LEFT axillary and retropectoral adenopathy as well as LEFT supraclavicular adenopathy.   Signs of pulmonary metastatic disease.   Mild asymmetric uptake in the LEFT as compared to the RIGHT adrenal gland. Equivocal but suspicious, attention on follow-up.   Aortic atherosclerosis.   Cystic changes and potential bronchiectasis in the RIGHT lung base likely post infectious or inflammatory. Correlate with any respiratory symptoms. Comparison with prior imaging may be helpful with attention on follow-up.   08/28/2020 Imaging   BRAIN MRI IMPRESSION: 1. Subacute lacunar infarct of the left basal ganglia with no malignant hemorrhagic transformation or mass effect. Underlying chronic bilateral basal ganglia ischemia. 2. No metastatic disease or other acute intracranial abnormality identified.   08/28/2020 Imaging   Breast MRI   IMPRESSION: 1. 8.1 x 8.1 x 5.1 cm area of biopsy-proven malignancy in the central, upper outer and lower outer quadrants of the left breast. 2. Diffuse anterior left breast skin thickening without abnormal enhancement. This may represent thickening due to lymphedema associated with lymphatic obstruction. 3. Metastatic level 1 and level 2 left axillary adenopathy. 4. No evidence of  malignancy on the right.   09/11/2020 -  Chemotherapy   Patient is on Treatment Plan : BREAST ADO-Trastuzumab Emtansine (Kadcyla) q21d     11/30/2020 Imaging   CT Chest w/o contrast  IMPRESSION: 1. Interval response to therapy. The left breast mass has mildly decreased in size in the interval. 2. Decrease in size of left axillary and left retropectoral lymph nodes. 3.  Small right lung nodules stable to improved. 4. Coronary artery calcifications noted. 5. Aortic Atherosclerosis (ICD10-I70.0).      INTERVAL HISTORY:  Christy [name] is here for a follow up of metastatic breast cancer. She was last seen by me on 12/16/20. She presents to the clinic accompanied by her daughter. Her daughter reports she fell several weeks ago. She reports leg cramps at night.   All other systems were reviewed with the patient and are negative.  MEDICAL HISTORY:  Past Medical History:  Diagnosis Date   Diabetes mellitus without complication ([place])    Hyperlipidemia    Hypertension     SURGICAL HISTORY: Past Surgical History:  Procedure Laterality Date   FOOT FRACTURE SURGERY Right    IR IMAGING GUIDED PORT INSERTION  09/22/2020   IR RADIOLOGIST EVAL & MGMT  09/25/2020    I have reviewed the social history and family history with the patient and they are unchanged from previous note.  ALLERGIES:  has No Known Allergies.  MEDICATIONS:  Current Outpatient Medications  Medication Sig Dispense Refill   amLODipine (NORVASC) 5 MG tablet  (Patient not taking: Reported on 09/17/2020)     aspirin EC 325 MG tablet Take 1 tablet (325 mg total) by mouth daily. 30 tablet 5   atorvastatin (LIPITOR) 20 MG tablet Take 1 tablet (20 mg total) by mouth daily. 30 tablet 5   cetirizine (ZYRTEC ALLERGY) 10 MG tablet Take 1 tablet (10 mg total) by mouth daily. (Patient not taking: Reported on 09/17/2020) 30 tablet 0   clotrimazole (CLOTRIMAZOLE AF) 1 % cream Apply 1 application topically 2 (two) times daily. 30 g 0   fluticasone (FLONASE) 50 MCG/ACT nasal spray Place 1 spray into both nostrils daily. (Patient not taking: Reported on 09/17/2020) 1 g 0   lidocaine-prilocaine (EMLA) cream Apply 1 application topically as needed. 30 g 2   metFORMIN (GLUCOPHAGE) 500 MG tablet Take 1 tablet (500 mg total) by mouth 2 (two) times daily with a meal. 60 tablet 1   olmesartan (BENICAR) 40 MG  tablet      No current facility-administered medications for this visit.   Facility-Administered Medications Ordered in Other Visits  Medication Dose Route Frequency Provider Last Rate Last Admin   sodium chloride flush (NS) 0.9 % injection 10 mL  10 mL Intracatheter PRN Truitt Merle, MD   10 mL at 01/05/21 1452    PHYSICAL EXAMINATION: ECOG PERFORMANCE STATUS: 2 - Symptomatic, <50% confined to bed  Vitals:   01/05/21 1147  BP: (!) 188/72  Pulse: 62  Resp: 17  Temp: 98.1 F (36.7 C)  SpO2: 99%   Wt Readings from Last 3 Encounters:  01/05/21 172 lb 9.6 oz (78.3 kg)  12/16/20 174 lb 4.8 oz (79.1 kg)  11/13/20 177 lb 3.2 oz (80.4 kg)     GENERAL:alert, no distress and comfortable SKIN: skin color, texture, turgor are normal, no rashes or significant lesions EYES: normal, Conjunctiva are pink and non-injected, sclera clear  NECK: supple, thyroid normal size, non-tender, without nodularity LYMPH:  no palpable lymphadenopathy in the cervical, axillary  LUNGS:  clear to auscultation and percussion with normal breathing effort HEART: regular rate & rhythm and no murmurs and no lower extremity edema ABDOMEN:abdomen soft, non-tender and normal bowel sounds Musculoskeletal:no cyanosis of digits and no clubbing  NEURO: alert & oriented x 3 with fluent speech, no focal motor/sensory deficits BREAST: 2.5 x 1.5 cm at 1 o'clock of left breast, stable.  LABORATORY DATA:  I have reviewed the data as listed CBC Latest Ref Rng & Units 01/05/2021 12/16/2020 11/13/2020  WBC 4.0 - 10.5 K/uL 6.4 6.1 5.4  Hemoglobin 12.0 - 15.0 g/dL 10.8(L) 10.9(L) 10.7(L)  Hematocrit 36.0 - 46.0 % 33.0(L) 33.3(L) 32.3(L)  Platelets 150 - 400 K/uL 257 250 262     CMP Latest Ref Rng & Units 01/05/2021 12/16/2020 11/13/2020  Glucose 70 - 99 mg/dL 100(H) 95 98  BUN 8 - 23 mg/dL 17 19 18   Creatinine 0.44 - 1.00 mg/dL 1.64(H) 1.76(H) 1.80(H)  Sodium 135 - 145 mmol/L 143 147(H) 145  Potassium 3.5 - 5.1 mmol/L 3.9 4.2  4.0  Chloride 98 - 111 mmol/L 109 114(H) 109  CO2 22 - 32 mmol/L 26 25 25   Calcium 8.9 - 10.3 mg/dL 9.8 10.6(H) 10.6(H)  Total Protein 6.5 - 8.1 g/dL 7.1 7.5 7.2  Total Bilirubin 0.3 - 1.2 mg/dL 0.4 0.4 0.4  Alkaline Phos 38 - 126 U/L 66 54 63  AST 15 - 41 U/L 24 28 31   ALT 0 - 44 U/L 17 16 21       RADIOGRAPHIC STUDIES: I have personally reviewed the radiological images as listed and agreed with the findings in the report. No results found.    No orders of the defined types were placed in this encounter.  All questions were answered. The patient knows to call the clinic with any problems, questions or concerns. No barriers to learning was detected. The total time spent in the appointment was 30 minutes.     Truitt Merle, MD 01/05/2021   I, Wilburn Mylar, am acting as scribe for Truitt Merle, MD.   I have reviewed the above documentation for accuracy and completeness, and I agree with the above.

## 2021-01-06 ENCOUNTER — Telehealth: Payer: Self-pay | Admitting: Hematology

## 2021-01-06 LAB — CANCER ANTIGEN 27.29: CA 27.29: 25.6 U/mL (ref 0.0–38.6)

## 2021-01-06 NOTE — Telephone Encounter (Signed)
Left message with follow-up appointment per 11/1 los.

## 2021-01-18 ENCOUNTER — Telehealth: Payer: Self-pay | Admitting: Emergency Medicine

## 2021-01-19 ENCOUNTER — Ambulatory Visit: Payer: Self-pay

## 2021-01-26 ENCOUNTER — Other Ambulatory Visit: Payer: Self-pay

## 2021-01-26 ENCOUNTER — Inpatient Hospital Stay: Payer: Medicare Other

## 2021-01-26 ENCOUNTER — Ambulatory Visit (HOSPITAL_COMMUNITY)
Admission: RE | Admit: 2021-01-26 | Discharge: 2021-01-26 | Disposition: A | Discharge status: Home / Self Care | Payer: Medicare Other | Source: Ambulatory Visit | Attending: Hematology | Admitting: Hematology

## 2021-01-26 ENCOUNTER — Encounter: Payer: Self-pay | Admitting: Hematology

## 2021-01-26 ENCOUNTER — Inpatient Hospital Stay (HOSPITAL_BASED_OUTPATIENT_CLINIC_OR_DEPARTMENT_OTHER): Payer: Medicare Other | Admitting: Hematology

## 2021-01-26 VITALS — BP 147/73 | HR 72 | Temp 97.8°F | Resp 18 | Ht 64.0 in | Wt 172.7 lb

## 2021-01-26 VITALS — BP 159/68 | HR 72 | Temp 97.7°F | Resp 18

## 2021-01-26 DIAGNOSIS — C50412 Malignant neoplasm of upper-outer quadrant of left female breast: Secondary | ICD-10-CM | POA: Diagnosis not present

## 2021-01-26 DIAGNOSIS — T1490XA Injury, unspecified, initial encounter: Secondary | ICD-10-CM | POA: Insufficient documentation

## 2021-01-26 DIAGNOSIS — Z171 Estrogen receptor negative status [ER-]: Secondary | ICD-10-CM | POA: Insufficient documentation

## 2021-01-26 DIAGNOSIS — Z95828 Presence of other vascular implants and grafts: Secondary | ICD-10-CM

## 2021-01-26 LAB — CMP (CANCER CENTER ONLY)
ALT: 18 U/L (ref 0–44)
AST: 27 U/L (ref 15–41)
Albumin: 3.4 g/dL — ABNORMAL LOW (ref 3.5–5.0)
Alkaline Phosphatase: 77 U/L (ref 38–126)
Anion gap: 10 (ref 5–15)
BUN: 23 mg/dL (ref 8–23)
CO2: 25 mmol/L (ref 22–32)
Calcium: 10.7 mg/dL — ABNORMAL HIGH (ref 8.9–10.3)
Chloride: 110 mmol/L (ref 98–111)
Creatinine: 1.94 mg/dL — ABNORMAL HIGH (ref 0.44–1.00)
GFR, Estimated: 26 mL/min — ABNORMAL LOW (ref >60)
Glucose, Bld: 101 mg/dL — ABNORMAL HIGH (ref 70–99)
Potassium: 4.1 mmol/L (ref 3.5–5.1)
Sodium: 145 mmol/L (ref 135–145)
Total Bilirubin: 0.4 mg/dL (ref 0.3–1.2)
Total Protein: 7.4 g/dL (ref 6.5–8.1)

## 2021-01-26 LAB — CBC WITH DIFFERENTIAL (CANCER CENTER ONLY)
Abs Immature Granulocytes: 0.02 10*3/uL (ref 0.00–0.07)
Basophils Absolute: 0 10*3/uL (ref 0.0–0.1)
Basophils Relative: 1 %
Eosinophils Absolute: 0.3 10*3/uL (ref 0.0–0.5)
Eosinophils Relative: 4 %
HCT: 33.6 % — ABNORMAL LOW (ref 36.0–46.0)
Hemoglobin: 11 g/dL — ABNORMAL LOW (ref 12.0–15.0)
Immature Granulocytes: 0 %
Lymphocytes Relative: 34 %
Lymphs Abs: 2.3 10*3/uL (ref 0.7–4.0)
MCH: 24 pg — ABNORMAL LOW (ref 26.0–34.0)
MCHC: 32.7 g/dL (ref 30.0–36.0)
MCV: 73.2 fL — ABNORMAL LOW (ref 80.0–100.0)
Monocytes Absolute: 0.4 10*3/uL (ref 0.1–1.0)
Monocytes Relative: 7 %
Neutro Abs: 3.7 10*3/uL (ref 1.7–7.7)
Neutrophils Relative %: 54 %
Platelet Count: 260 10*3/uL (ref 150–400)
RBC: 4.59 MIL/uL (ref 3.87–5.11)
RDW: 16.3 % — ABNORMAL HIGH (ref 11.5–15.5)
WBC Count: 6.7 10*3/uL (ref 4.0–10.5)
nRBC: 0 % (ref 0.0–0.2)

## 2021-01-26 IMAGING — DX DG LUMBAR SPINE 2-3V
3 series · 3 of 3 positions shown · non-contrast
Comparison: [DATE]

CLINICAL DATA: Fall 2 weeks ago with low back pain, initial
encounter

EXAM:
LUMBAR SPINE - 2-3 VIEW

[l-spine ap]
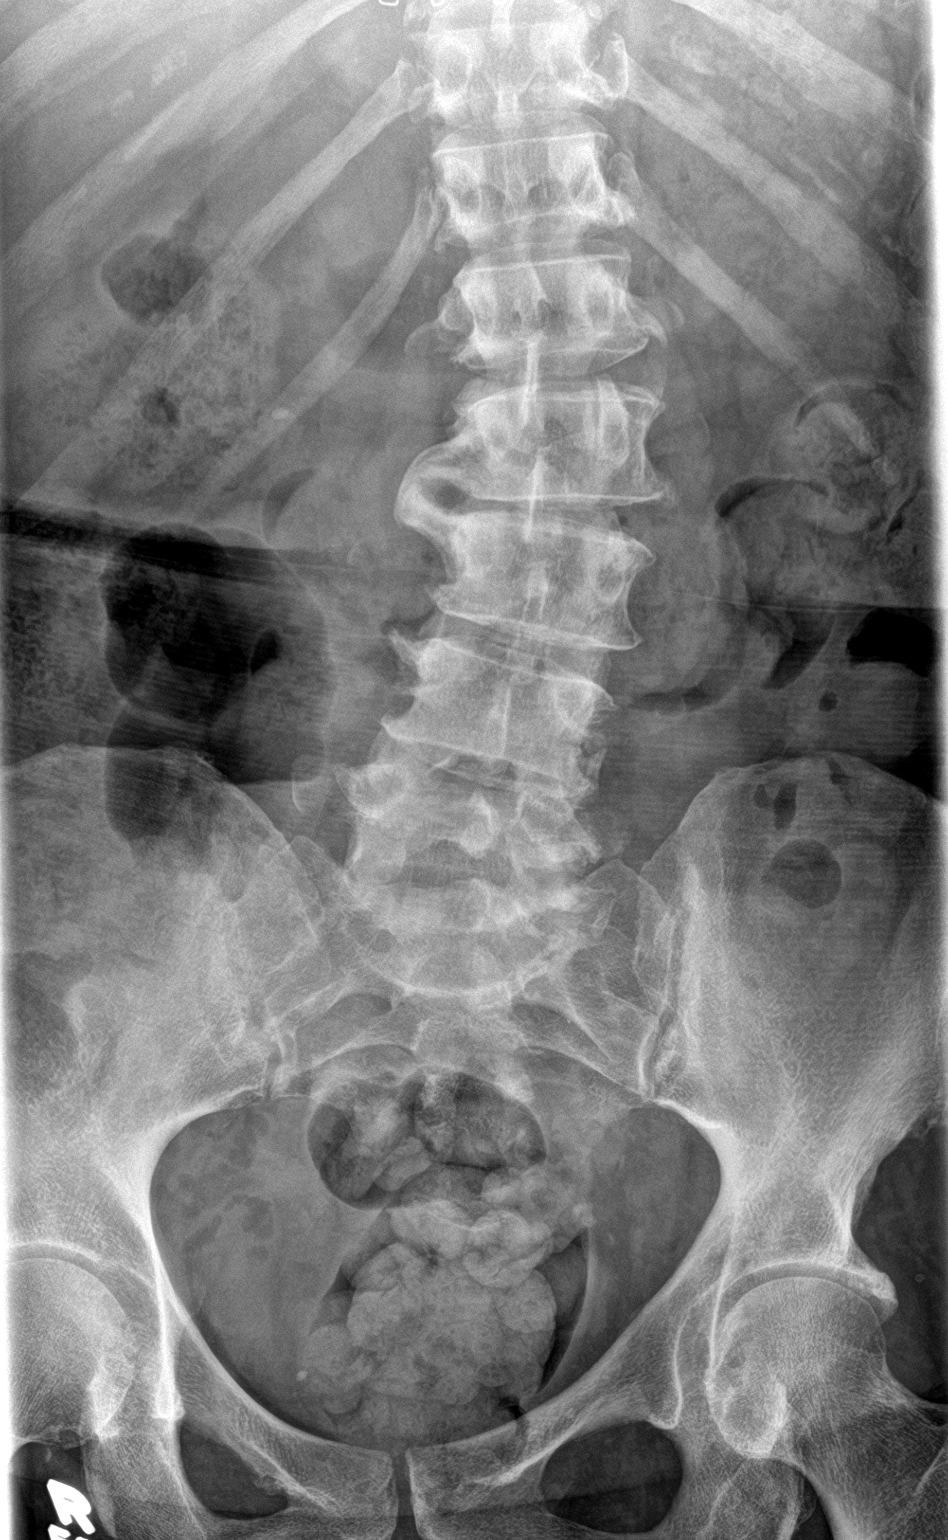

[l-spine lat]
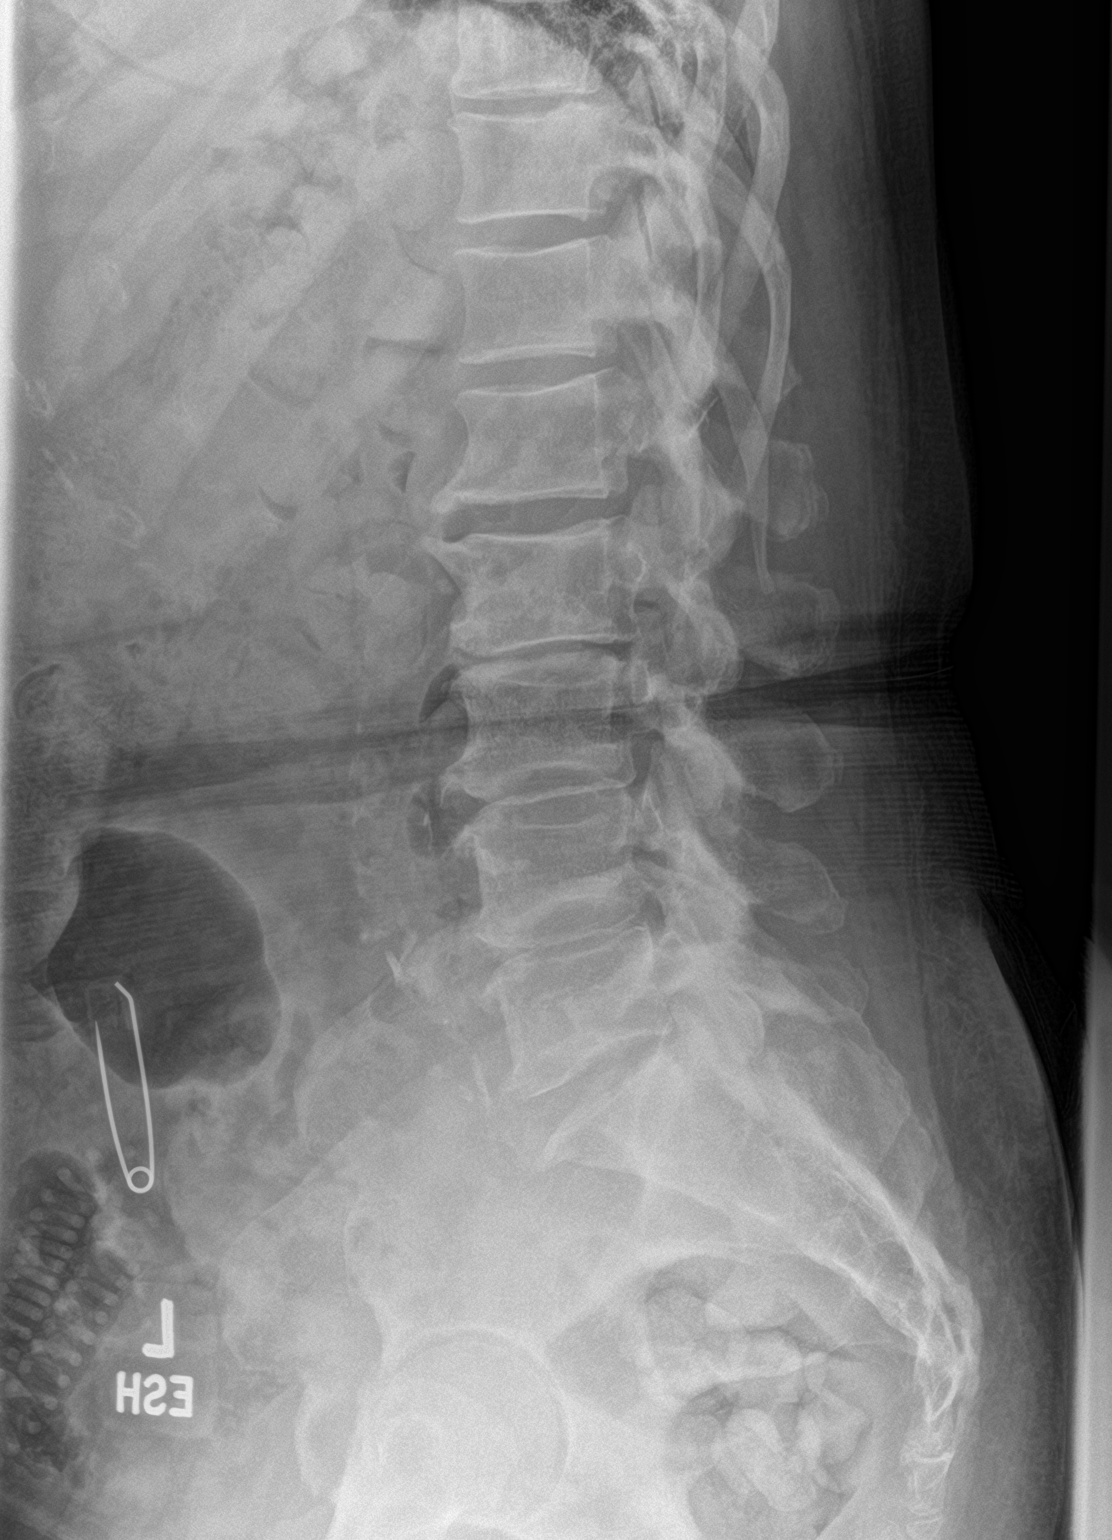

[l-spine spot]
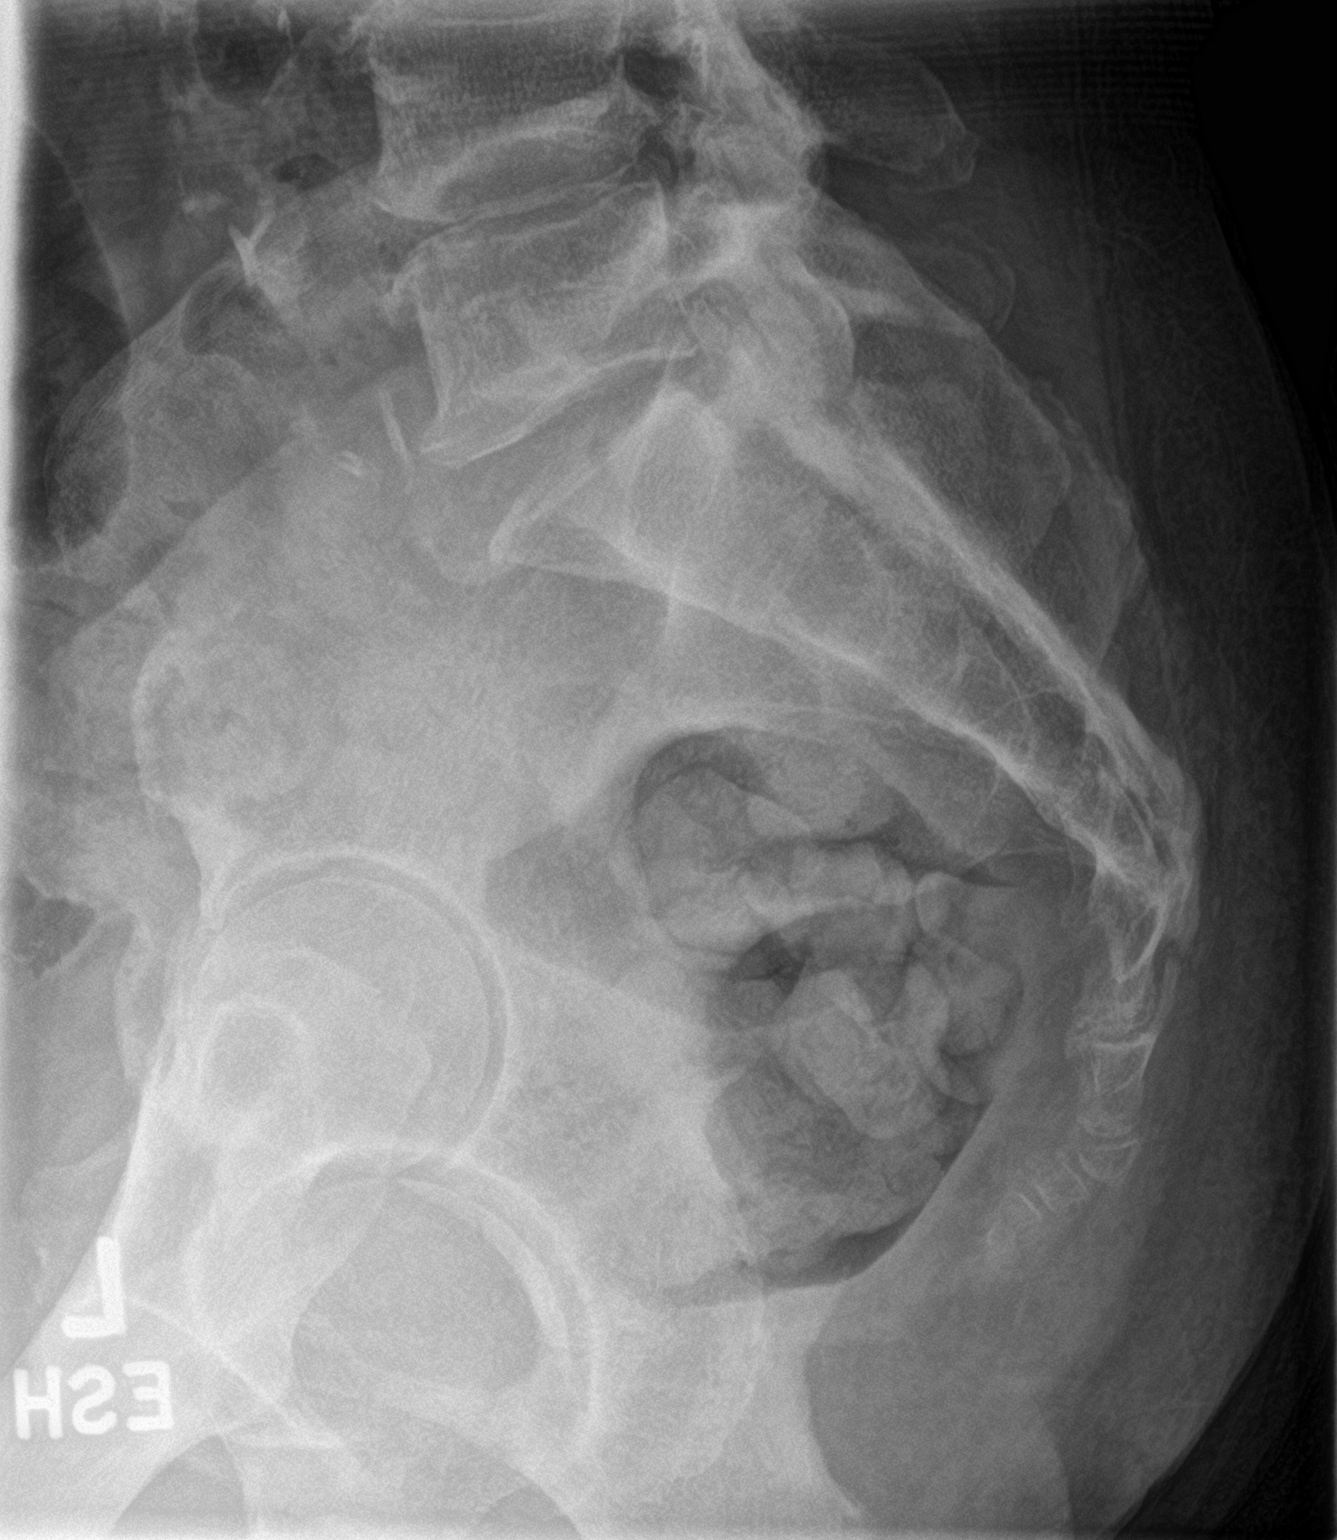

[3 of 3 positions shown; findings below may reference images not displayed]

FINDINGS: Five lumbar type vertebral bodies are well visualized. Vertebral
body height is well maintained. Osteophytic changes are seen.
Scoliosis concave to the right is seen stable from the prior exam.
Straightening of the normal lumbar lordosis is noted which may be
related to muscular spasm. No anterolisthesis is seen. Retained
fecal material is noted throughout the colon. Stable right renal
calculus is noted measuring 4 mm.
IMPRESSION: Degenerative change with scoliosis stable from the prior exam.

Changes of mild colonic constipation.

Right renal stone stable from the prior study.

## 2021-01-26 MED ORDER — SODIUM CHLORIDE 0.9 % IV SOLN
240.0000 mg | Freq: Once | INTRAVENOUS | Status: AC
  Administered 2021-01-26: 240 mg via INTRAVENOUS
  Filled 2021-01-26: qty 8

## 2021-01-26 MED ORDER — ACETAMINOPHEN 325 MG PO TABS
650.0000 mg | ORAL_TABLET | Freq: Once | ORAL | Status: AC
  Administered 2021-01-26: 650 mg via ORAL
  Filled 2021-01-26: qty 2

## 2021-01-26 MED ORDER — HEPARIN SOD (PORK) LOCK FLUSH 100 UNIT/ML IV SOLN
500.0000 [IU] | Freq: Once | INTRAVENOUS | Status: AC | PRN
  Administered 2021-01-26: 500 [IU]

## 2021-01-26 MED ORDER — SODIUM CHLORIDE 0.9% FLUSH
10.0000 mL | Freq: Once | INTRAVENOUS | Status: AC
  Administered 2021-01-26: 10 mL

## 2021-01-26 MED ORDER — SODIUM CHLORIDE 0.9% FLUSH
10.0000 mL | INTRAVENOUS | Status: DC | PRN
  Administered 2021-01-26: 10 mL

## 2021-01-26 MED ORDER — SODIUM CHLORIDE 0.9 % IV SOLN: Freq: Once | INTRAVENOUS | Status: AC

## 2021-01-26 MED ORDER — DIPHENHYDRAMINE HCL 25 MG PO CAPS
50.0000 mg | ORAL_CAPSULE | Freq: Once | ORAL | Status: AC
  Administered 2021-01-26: 50 mg via ORAL
  Filled 2021-01-26: qty 2

## 2021-01-26 NOTE — Progress Notes (Signed)
[name]   [phone] Fax:([phone]   Clinic Follow up Note   Patient Care Team: Nolene Ebbs, MD as PCP - General (Internal Medicine) Mauro Kaufmann, RN as Oncology Nurse Navigator Rockwell Germany, RN as Oncology Nurse Navigator  Date of Service:  01/26/2021  CHIEF COMPLAINT: f/u of metastatic breast cancer  CURRENT THERAPY:  Kadcyla every 3 weeks, started on 09/11/20  ASSESSMENT & PLAN:  Christy [name] is a 80 y.o. female with   1. Malignant neoplasm of overlapping sites in the central and upper out quadrant left breast, invasive ductal carcinoma, cT2N3M1 stage IV, grade 2-3, ER/PR negative HER2 positive, MIB-1 of 20% - with nodal and pulmonary metastases -She initially self palpated a left breast mass in 06/2020, possibly earlier.  Work-up showed 2 masses in the left upper outer breast and 3 abnormal lymph nodes.  Biopsy of the 2 masses and 1 lymph node all showed invasive ductal carcinoma, grade 2-3, ER/PR negative and HER2 positive. -PET scan 08/28/20 shows the hypermetabolic known left breast malignancy with hypermetabolic left axillary, retropectoral, and supraclavicular adenopathy.  Additionally there are several small 3-6 mm pulmonary nodules with mild hypermetabolic activity, felt to be consistent with pulmonary metastasis.  -baseline labs which shows CKD and mild anemia, CA 27.29 is normal. Baseline echo EF 55-60% -She was started on first-line systemic Kadcyla, given once every 3 weeks, on 09/11/20. She tolerates well with some fatigue. -restaging chest CT 11/30/20 showed positive response to therapy, with decrease in size of left breast mass, lymph nodes, and right lung nodules.  -I decreased her Kadcyla dose with C4 due to fatigue.  -repeat echo 12/24/20 was stable. -Labs reviewed, overall adequate for treatment.   2. Fall -she recently fell off a chair while reaching for a cabinet. Daughter reports she hit her mid-low back and her  head. This has caused pain with walking, making walking more difficult (she has leg weakness at baseline) -I will order lumbar spine x-ray and head CT to rule out fractures and bleeding.   3. Symptom management: Constipation, difficulty urinating, diminished appetite/weight loss, fatigue -chronic, but worsening -has not had a bowel movement in almost a week. She tried OTC medication but only took one a day. It did not help. I recommend either increasing the dose or using Magnesium Citrate. I again told her to increase her miralax to twice a day. -also reports only urinating once a day, despite feeling the need to go -she does not eat enough. Given her DM, I recommend she use Glucerna for nutritional supplement.   4. Genetics -Her sister had metastatic breast cancer in her 53s, and that sister's daughter possibly have breast cancer. -Due to family history and ER/PR negative disease, patient qualifies for genetics. She has not been evaluated or tested yet. -Patient's daughter understands her breast cancer risk and the importance of annual mammography.   5. Spinal stenosis -Reportedly diagnosed in [place], [place].  Patient has chronic back pain and left leg weakness at baseline -She now also has right leg weakness. She is ambulating with a walker. -PET is negative for spinal mets and brain MRI shows subacute lacunar infarct without metastatic disease    6. HTN, DM -on metformin, amlodipine, and olmesartan -f/up PCP Dr. Jeanie Cooks -Denies baseline neuropathy   7. Subacute stroke -Her brain MRI showed subacute lacunar infarct of the left basal ganglia, she has no clinical symptoms of stroke. -She was seen by Dr. Mickeal Skinner on 09/17/20. He recommended cholesterol  medicine and full-dose aspirin. She was prescribed lipitor at that time.     PLAN: -proceed with C7 Kadcyla with same dose reduction to 55m/kg -lumbar x-ray today, head CT soon due to her recent fall  -labs, flush, f/u, and Kadcyla in 3 and 6  weeks   -PET in 5-6 weeks, prior to visit   No problem-specific Assessment & Plan notes found for this encounter.   SUMMARY OF ONCOLOGIC HISTORY: Oncology History Overview Note  Cancer Staging Malignant neoplasm of upper-outer quadrant of female breast ([facility] Staging form: Breast, AJCC 8th Edition - Clinical stage from 07/31/2020: Stage IV (cT2, cN3, cM1, G2, ER-, PR-, HER2+) - Signed by BAlla Feeling NP on 08/31/2020 Stage prefix: Initial diagnosis Nuclear grade: G2 Histologic grading system: 3 grade system    Malignant neoplasm of upper-outer quadrant of female breast ([facility]  07/24/2020 Breast [place]  On physical exam, a very firm mass is identified at the 1:30 position of the LEFT breast 5 cm from the nipple.   IMPRESSION: 1. Highly suspicious 4.3 cm mass at the 1:30 position of the LEFT breast and highly suspicious 2.7 cm mass at the 2 o'clock position of the LEFT breast, with the 2 masses encompassing an area measuring at least 6.1 cm. 2. 3 abnormal LEFT axillary lymph nodes with cortical thickening, tissue sampling of 1 of these lymph nodes recommended. 3. Anterior LEFT breast skin thickening nonspecific but may reflect dermal involvement. 4. No mammographic evidence of RIGHT breast malignancy.   07/31/2020 Initial Biopsy   Diagnosis 1. Breast, left, needle core biopsy, 1 :30 pm, 5cmfn - INVASIVE DUCTAL CARCINOMA, SEE COMMENT. 2. Breast, left, needle core biopsy, 2 o'clock, 8cmfn - INVASIVE DUCTAL CARCINOMA, SEE COMMENT. - DUCTAL CARCINOMA IN SITU. 3. Lymph node, needle/core biopsy, left axillary - METASTATIC CARCINOMA IN A LYMPH NODE. Microscopic Comment 1. and 3. The carcinoma in both parts has a similar morphology and appears grade 2-3. The DCIS in part 2 is high-grade with necrosis. The carcinoma measures 15 mm (part 1) and 14 mm (part 2) in greatest linear extent  2. PROGNOSTIC INDICATORS Results: IMMUNOHISTOCHEMICAL AND MORPHOMETRIC ANALYSIS PERFORMED MANUALLY The  tumor cells are POSITIVE for Her2 (3+). Estrogen Receptor: 0%, NEGATIVE Progesterone Receptor: 0%, NEGATIVE Proliferation Marker Ki67: 20%    07/31/2020 Cancer Staging   Staging form: Breast, AJCC 8th Edition - Clinical stage from 07/31/2020: Stage IV (cT2, cN3, cM1, G2, ER-, PR-, HER2+) - Signed by BAlla Feeling NP on 08/31/2020 Stage prefix: Initial diagnosis Nuclear grade: G2 Histologic grading system: 3 grade system    08/13/2020 Initial Diagnosis   Malignant neoplasm of upper-outer quadrant of female breast ([facility]   08/28/2020 PET scan   IMPRESSION: Signs of LEFT breast cancer with LEFT axillary and retropectoral adenopathy as well as LEFT supraclavicular adenopathy.   Signs of pulmonary metastatic disease.   Mild asymmetric uptake in the LEFT as compared to the RIGHT adrenal gland. Equivocal but suspicious, attention on follow-up.   Aortic atherosclerosis.   Cystic changes and potential bronchiectasis in the RIGHT lung base likely post infectious or inflammatory. Correlate with any respiratory symptoms. Comparison with prior imaging may be helpful with attention on follow-up.   08/28/2020 Imaging   BRAIN MRI IMPRESSION: 1. Subacute lacunar infarct of the left basal ganglia with no malignant hemorrhagic transformation or mass effect. Underlying chronic bilateral basal ganglia ischemia. 2. No metastatic disease or other acute intracranial abnormality identified.   08/28/2020 Imaging   Breast MRI   IMPRESSION:  1. 8.1 x 8.1 x 5.1 cm area of biopsy-proven malignancy in the central, upper outer and lower outer quadrants of the left breast. 2. Diffuse anterior left breast skin thickening without abnormal enhancement. This may represent thickening due to lymphedema associated with lymphatic obstruction. 3. Metastatic level 1 and level 2 left axillary adenopathy. 4. No evidence of malignancy on the right.   09/11/2020 -  Chemotherapy   Patient is on Treatment Plan : BREAST  ADO-Trastuzumab Emtansine (Kadcyla) q21d     11/30/2020 Imaging   CT Chest w/o contrast  IMPRESSION: 1. Interval response to therapy. The left breast mass has mildly decreased in size in the interval. 2. Decrease in size of left axillary and left retropectoral lymph nodes. 3. Small right lung nodules stable to improved. 4. Coronary artery calcifications noted. 5. Aortic Atherosclerosis (ICD10-I70.0).      INTERVAL HISTORY:  Christy [name] is here for a follow up of metastatic breast cancer. She was last seen by me on 01/05/21. She presents to the clinic accompanied by her daughter. She reports she is not doing well today. She reports she fell after climbing on a chair to reach a cabinet, and notes she hit her head and her back.  She reports intermittent pain to the right side of her face. She notes this occurred at least once prior to the fall but has gotten worse since. She denies any new breast issues.   All other systems were reviewed with the patient and are negative.  MEDICAL HISTORY:  Past Medical History:  Diagnosis Date   Diabetes mellitus without complication ([facility])    Hyperlipidemia    Hypertension     SURGICAL HISTORY: Past Surgical History:  Procedure Laterality Date   FOOT FRACTURE SURGERY Right    IR IMAGING GUIDED PORT INSERTION  09/22/2020   IR RADIOLOGIST EVAL & MGMT  09/25/2020    I have reviewed the social history and family history with the patient and they are unchanged from previous note.  ALLERGIES:  has No Known Allergies.  MEDICATIONS:  Current Outpatient Medications  Medication Sig Dispense Refill   amLODipine (NORVASC) 5 MG tablet  (Patient not taking: Reported on 09/17/2020)     aspirin EC 325 MG tablet Take 1 tablet (325 mg total) by mouth daily. 30 tablet 5   atorvastatin (LIPITOR) 20 MG tablet Take 1 tablet (20 mg total) by mouth daily. 30 tablet 5   cetirizine (ZYRTEC ALLERGY) 10 MG tablet Take 1 tablet (10 mg total) by mouth daily.  (Patient not taking: Reported on 09/17/2020) 30 tablet 0   clotrimazole (CLOTRIMAZOLE AF) 1 % cream Apply 1 application topically 2 (two) times daily. 30 g 0   fluticasone (FLONASE) 50 MCG/ACT nasal spray Place 1 spray into both nostrils daily. (Patient not taking: Reported on 09/17/2020) 1 g 0   lidocaine-prilocaine (EMLA) cream Apply 1 application topically as needed. 30 g 2   metFORMIN (GLUCOPHAGE) 500 MG tablet Take 1 tablet (500 mg total) by mouth 2 (two) times daily with a meal. 60 tablet 1   olmesartan (BENICAR) 40 MG tablet      No current facility-administered medications for this visit.    PHYSICAL EXAMINATION: ECOG PERFORMANCE STATUS: 2 - Symptomatic, <50% confined to bed  Vitals:   01/26/21 1143  BP: (!) 147/73  Pulse: 72  Resp: 18  Temp: 97.8 F (36.6 C)  SpO2: 98%   Wt Readings from Last 3 Encounters:  01/26/21 172 lb 11.2 oz (78.3 kg)  01/05/21 172 lb 9.6 oz (78.3 kg)  12/16/20 174 lb 4.8 oz (79.1 kg)     GENERAL:alert, no distress and comfortable SKIN: skin color normal, no rashes or significant lesions EYES: normal, Conjunctiva are pink and non-injected, sclera clear  NEURO: alert & oriented x 3 with fluent speech  LABORATORY DATA:  I have reviewed the data as listed CBC Latest Ref Rng & Units 01/26/2021 01/05/2021 12/16/2020  WBC 4.0 - 10.5 K/uL 6.7 6.4 6.1  Hemoglobin 12.0 - 15.0 g/dL 11.0(L) 10.8(L) 10.9(L)  Hematocrit 36.0 - 46.0 % 33.6(L) 33.0(L) 33.3(L)  Platelets 150 - 400 K/uL 260 257 250     CMP Latest Ref Rng & Units 01/26/2021 01/05/2021 12/16/2020  Glucose 70 - 99 mg/dL 101(H) 100(H) 95  BUN 8 - 23 mg/dL _0 Creatinine 0.44 - 1.00 mg/dL 1.94(H) 1.64(H) 1.76(H)  Sodium 135 - 145 mmol/L 145 143 147(H)  Potassium 3.5 - 5.1 mmol/L 4.1 3.9 4.2  Chloride 98 - 111 mmol/L 110 109 114(H)  CO2 22 - 32 mmol/L _1 Calcium 8.9 - 10.3 mg/dL 10.7(H) 9.8 10.6(H)  Total Protein 6.5 - 8.1 g/dL 7.4 7.1 7.5  Total Bilirubin 0.3 - 1.2 mg/dL 0.4 0.4  0.4  Alkaline Phos 38 - 126 U/L 77 66 54  AST 15 - 41 U/L _2 ALT 0 - 44 U/L _3 RADIOGRAPHIC STUDIES: I have personally reviewed the radiological images as listed and agreed with the findings in the report. DG Lumbar Spine 2-3 Views  Result Date: 01/26/2021 CLINICAL DATA:  Fall 2 weeks ago with low back pain, initial encounter EXAM: LUMBAR SPINE - 2-3 VIEW COMPARISON:  07/06/2020 FINDINGS: Five lumbar type vertebral bodies are well visualized. Vertebral body height is well maintained. Osteophytic changes are seen. Scoliosis concave to the right is seen stable from the prior exam. Straightening of the normal lumbar lordosis is noted which may be related to muscular spasm. No anterolisthesis is seen. Retained fecal material is noted throughout the colon. Stable right renal calculus is noted measuring 4 mm. IMPRESSION: Degenerative change with scoliosis stable from the prior exam. Changes of mild colonic constipation. Right renal stone stable from the prior study. Electronically Signed   By: Inez Catalina M.D.   On: 01/26/2021 22:25      Orders Placed This Encounter  Procedures   CT HEAD WO CONTRAST (5MM)    Standing Status:   Future    Standing Expiration Date:   01/26/2022    Order Specific Question:   Preferred imaging location?    Answer:   [facility]   DG Lumbar Spine 2-3 Views    Standing Status:   Future    Number of Occurrences:   1    Standing Expiration Date:   01/26/2022    Order Specific Question:   Reason for Exam (SYMPTOM  OR DIAGNOSIS REQUIRED)    Answer:   fall 2 weeks ago, landed on back and wrosening back pain since then, rule out fracture    Order Specific Question:   Preferred imaging location?    Answer:   [facility]   All questions were answered. The patient knows to call the clinic with any problems, questions or concerns. No barriers to learning was detected. The total time spent in the appointment was 30 minutes.     Truitt Merle, MD 01/26/2021   I, Wilburn Mylar, am acting as scribe  for Truitt Merle, MD.   I have reviewed the above documentation for accuracy and completeness, and I agree with the above.

## 2021-01-26 NOTE — Patient Instructions (Signed)
[facility]   Discharge Instructions: Thank you for choosing [facility] to provide your oncology and hematology care.   If you have a lab appointment with the [facility], please go directly to the [facility] and check in at the registration area.   Wear comfortable clothing and clothing appropriate for easy access to any Portacath or PICC line.   We strive to give you quality time with your provider. You may need to reschedule your appointment if you arrive late (15 or more minutes).  Arriving late affects you and other patients whose appointments are after yours.  Also, if you miss three or more appointments without notifying the office, you may be dismissed from the clinic at the provider's discretion.      For prescription refill requests, have your pharmacy contact our office and allow 72 hours for refills to be completed.    Today you received the following chemotherapy and/or immunotherapy agent: Trastuzumab (Kadcyla)   To help prevent nausea and vomiting after your treatment, we encourage you to take your nausea medication as directed.  BELOW ARE SYMPTOMS THAT SHOULD BE REPORTED IMMEDIATELY: *FEVER GREATER THAN 100.4 F (38 C) OR HIGHER *CHILLS OR SWEATING *NAUSEA AND VOMITING THAT IS NOT CONTROLLED WITH YOUR NAUSEA MEDICATION *UNUSUAL SHORTNESS OF BREATH *UNUSUAL BRUISING OR BLEEDING *URINARY PROBLEMS (pain or burning when urinating, or frequent urination) *BOWEL PROBLEMS (unusual diarrhea, constipation, pain near the anus) TENDERNESS IN MOUTH AND THROAT WITH OR WITHOUT PRESENCE OF ULCERS (sore throat, sores in mouth, or a toothache) UNUSUAL RASH, SWELLING OR PAIN  UNUSUAL VAGINAL DISCHARGE OR ITCHING   Items with * indicate a potential emergency and should be followed up as soon as possible or go to the Emergency Department if any problems should occur.  Please show the CHEMOTHERAPY ALERT CARD or IMMUNOTHERAPY ALERT CARD at  check-in to the Emergency Department and triage nurse.  Should you have questions after your visit or need to cancel or reschedule your appointment, please contact [facility]  Dept: [phone]  and follow the prompts.  Office hours are 8:00 a.m. to 4:30 p.m. Monday - Friday. Please note that voicemails left after 4:00 p.m. may not be returned until the following business day.  We are closed weekends and major holidays. You have access to a nurse at all times for urgent questions. Please call the main number to the clinic Dept: [phone] and follow the prompts.   For any non-urgent questions, you may also contact your provider using MyChart. We now offer e-Visits for anyone 49 and older to request care online for non-urgent symptoms. For details visit [email or web address].   Also download the MyChart app! Go to the app store, search "MyChart", open the app, select [facility], and log in with your MyChart username and password.  Due to Covid, a mask is required upon entering the hospital/clinic. If you do not have a mask, one will be given to you upon arrival. For doctor visits, patients may have 1 support person aged 110 or older with them. For treatment visits, patients cannot have anyone with them due to current Covid guidelines and our immunocompromised population.

## 2021-01-26 NOTE — Progress Notes (Signed)
Per Dr. Burr Medico, ok to proceed with elevated scr.

## 2021-01-26 NOTE — Progress Notes (Signed)
Decrease Kadcyla to 240mg  per MD review.  Future orders changed as well.  Raul Del [place], [place], BCPS, BCOP 01/26/2021 12:51 PM

## 2021-01-27 ENCOUNTER — Telehealth: Payer: Self-pay

## 2021-01-27 ENCOUNTER — Ambulatory Visit (HOSPITAL_COMMUNITY): Payer: Medicare Other | Attending: Hematology

## 2021-01-27 LAB — CANCER ANTIGEN 27.29: CA 27.29: 25 U/mL (ref 0.0–38.6)

## 2021-01-27 NOTE — Telephone Encounter (Signed)
This nurse reached out to this patient related to scheduled CT scan scheduled for 01/27/21.  Left a message and patient to give [place] a call in the clinic.  No further questions or concerns at this time.

## 2021-01-27 NOTE — Telephone Encounter (Signed)
-----   Message from Truitt Merle, MD sent at 01/27/2021 11:39 AM EST ----- Please let's daughter pt know the result, no fracture, thanks   YF

## 2021-01-27 NOTE — Telephone Encounter (Signed)
This nurse reached out to patient.  Left a message for patient to call clinic related to scheduled CT scan.  No further concerns at this time.

## 2021-01-27 NOTE — Telephone Encounter (Signed)
This nurse reached out to patient related to results of her Xray of her spine and her scheduled CT.  Left a message requesting patient to give [place] a call in the clinic as soon as possible.  No further concerns at this time.

## 2021-01-29 ENCOUNTER — Telehealth: Payer: Self-pay

## 2021-01-29 ENCOUNTER — Telehealth: Payer: Self-pay | Admitting: Hematology

## 2021-01-29 NOTE — Telephone Encounter (Signed)
Left message with follow-up appointment per 11/22 los.

## 2021-01-29 NOTE — Telephone Encounter (Signed)
Returned pt voicemail message to give her a call.  Pt did not indicate why she was calling.  Hopefully pt will give [place] a return call stating the purpose of her initial call.

## 2021-02-08 ENCOUNTER — Other Ambulatory Visit: Payer: Self-pay

## 2021-02-11 ENCOUNTER — Ambulatory Visit (HOSPITAL_COMMUNITY)
Admission: RE | Admit: 2021-02-11 | Discharge: 2021-02-11 | Disposition: A | Discharge status: Home / Self Care | Payer: Medicare Other | Source: Ambulatory Visit | Attending: Hematology | Admitting: Hematology

## 2021-02-11 ENCOUNTER — Other Ambulatory Visit: Payer: Self-pay

## 2021-02-11 DIAGNOSIS — S0990XA Unspecified injury of head, initial encounter: Secondary | ICD-10-CM | POA: Diagnosis present

## 2021-02-11 DIAGNOSIS — T1490XA Injury, unspecified, initial encounter: Secondary | ICD-10-CM

## 2021-02-11 DIAGNOSIS — W19XXXA Unspecified fall, initial encounter: Secondary | ICD-10-CM | POA: Diagnosis not present

## 2021-02-11 IMAGING — CT CT HEAD W/O CM
4 series · 17 of 47 positions shown, 19 images · non-contrast
Comparison: No prior head CT, correlation is made with MRI head
[DATE]

CLINICAL DATA: Fall 1.5 weeks ago.  Right-sided pain

EXAM:
CT HEAD WITHOUT CONTRAST
TECHNIQUE: Contiguous axial images were obtained from the base of the skull
through the vertex without intravenous contrast.

[Series 2: head wo · axial · 0.43mm/px · z∈[+1592,+1712]mm · 7 of 33 slices shown, 9 images]
[im 5/33  brain]
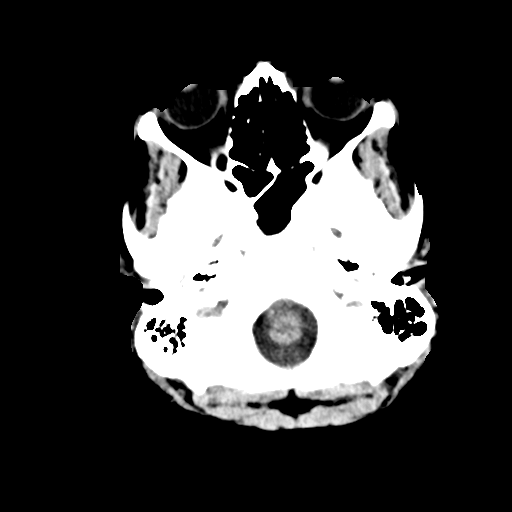
[im 5/33  bone]
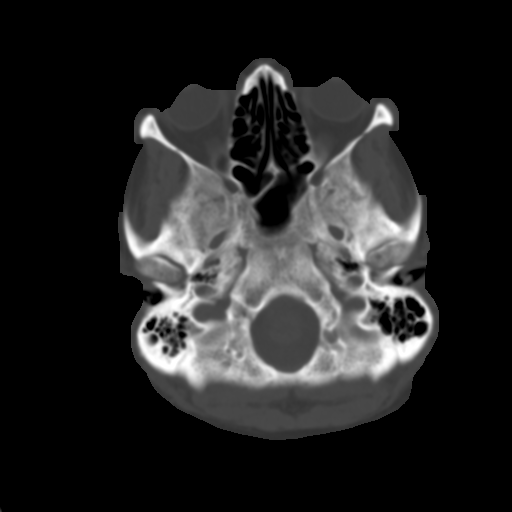
[im 9/33  brain]
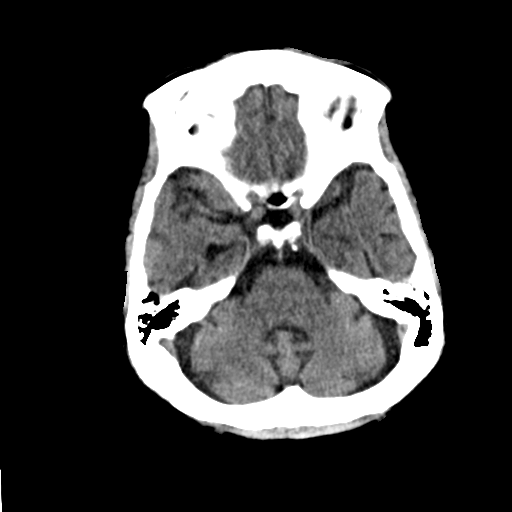
[im 13/33  brain]
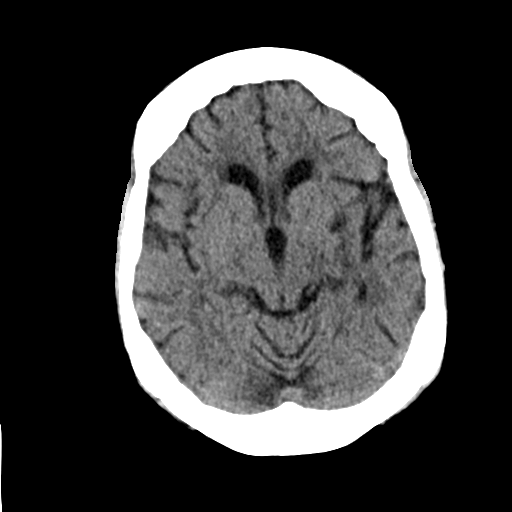
[im 17/33  brain]
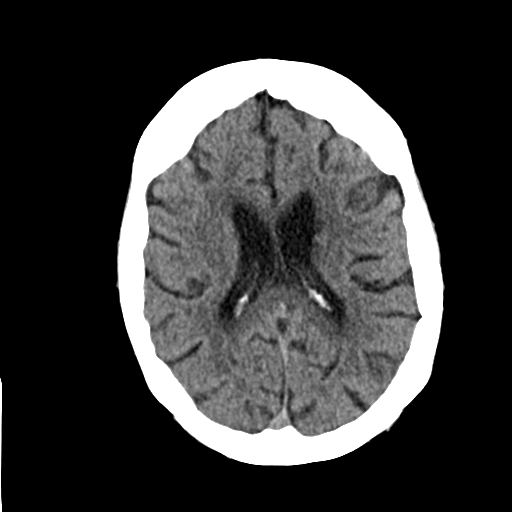
[im 21/33  brain]
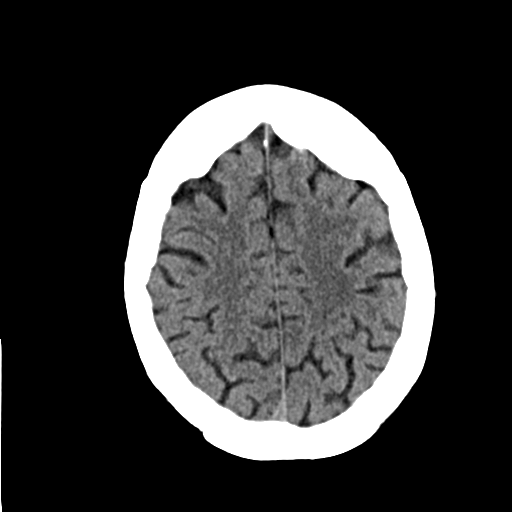
[im 21/33  bone]
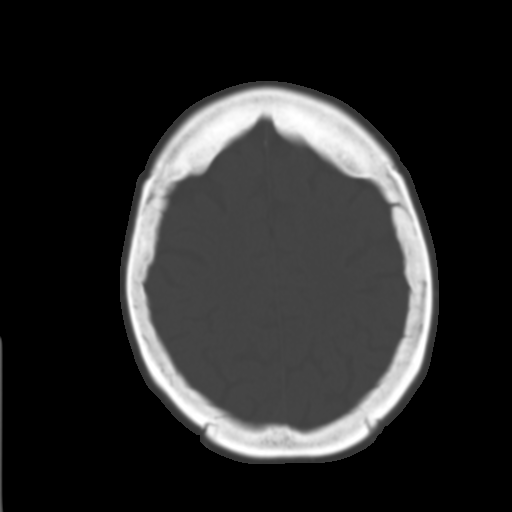
[im 25/33  brain]
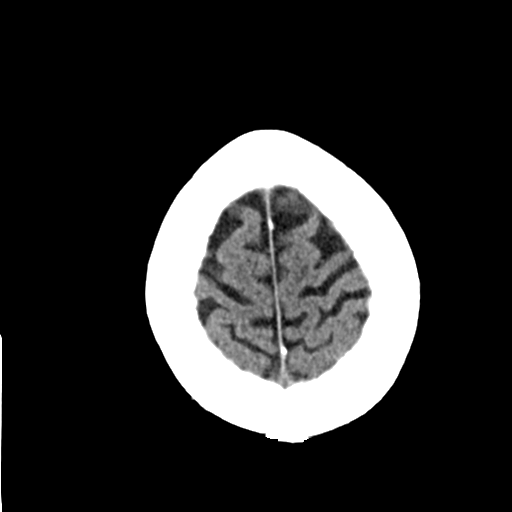
[im 29/33  brain]
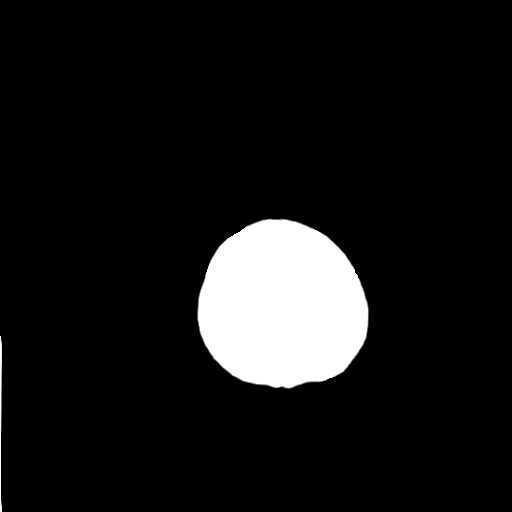

[Series 3: head bone · axial · 0.43mm/px · z∈[+1588,+1644]mm · 4 of 81 slices shown]
[im 9/81  bone]
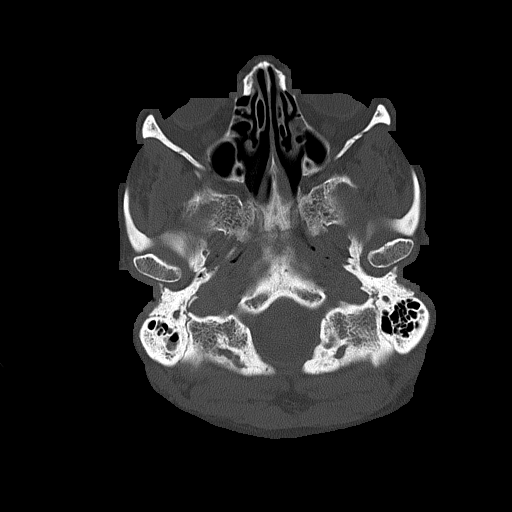
[im 17/81  bone]
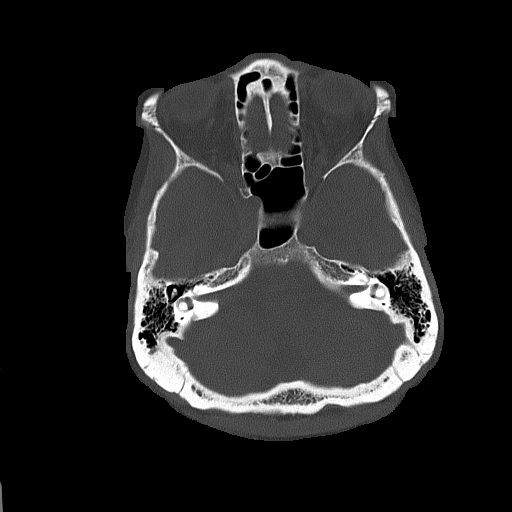
[im 25/81  bone]
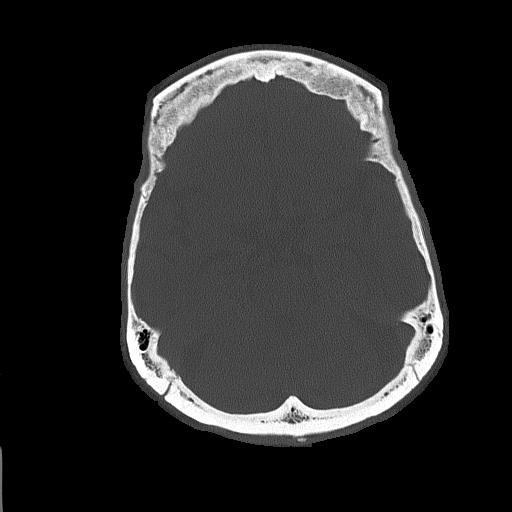
[im 37/81  bone]
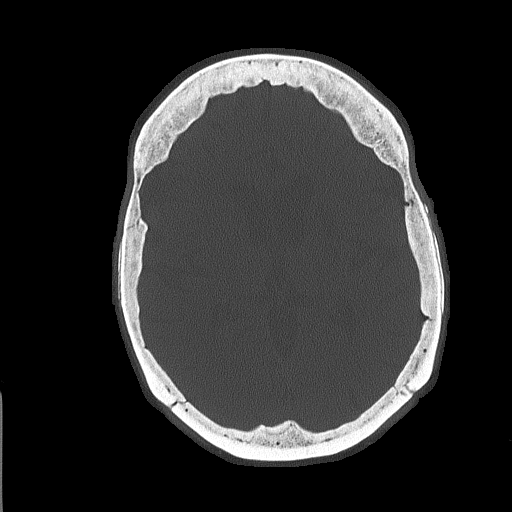

[Series 4: coronal soft tissue · coronal · 0.32mm/px · 3 of 67 slices shown]
[im 23/67  brain]
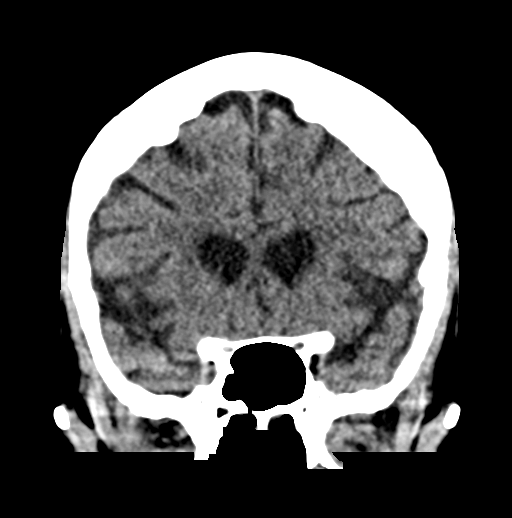
[im 30/67  brain]
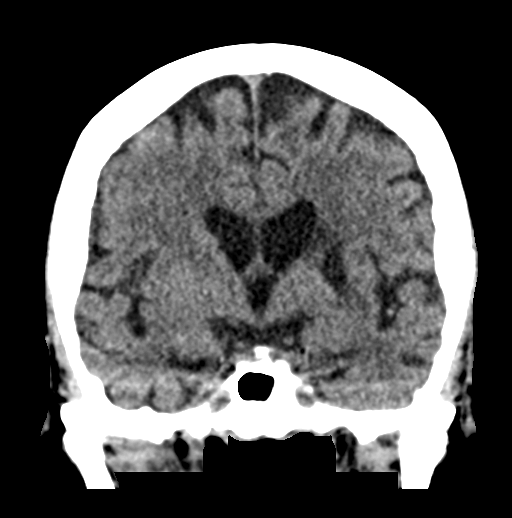
[im 37/67  brain]
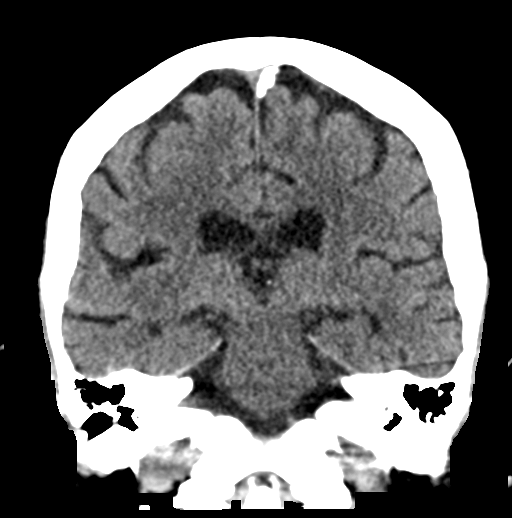

[Series 5: sagittal soft tissue · sagittal · 0.36mm/px · 3 of 59 slices shown]
[im 20/59  brain]
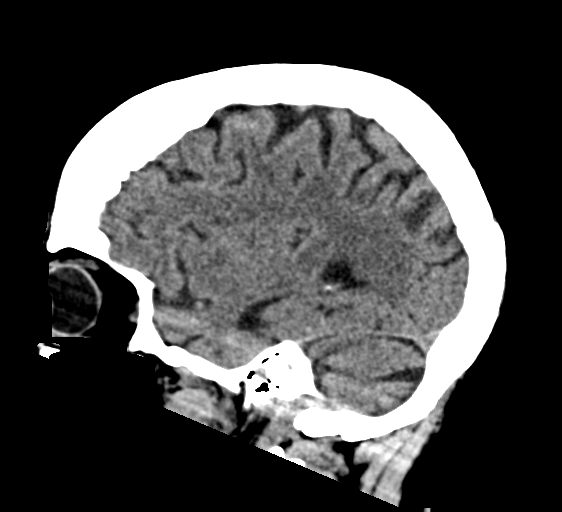
[im 30/59  brain]
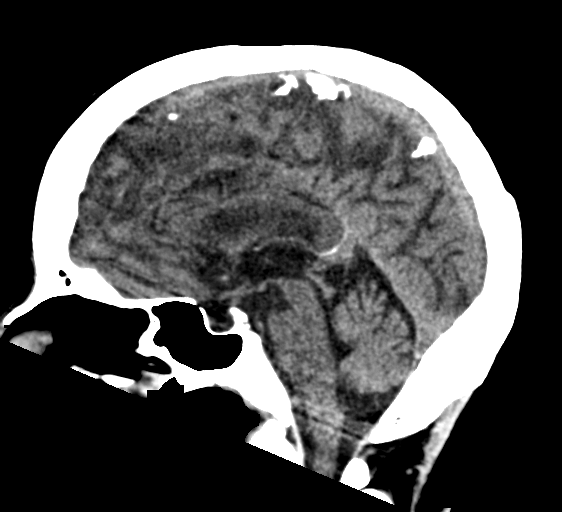
[im 39/59  brain]
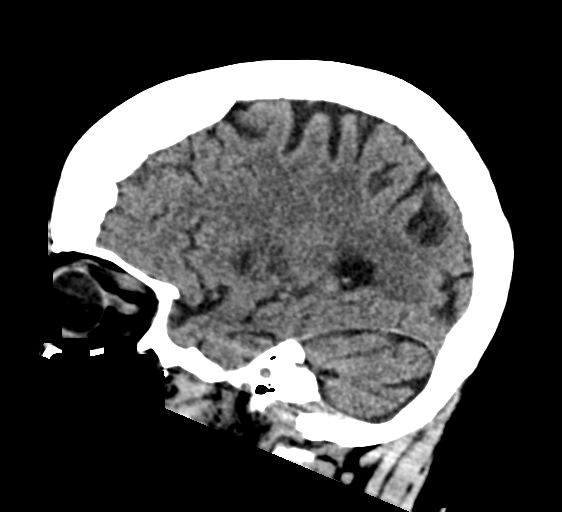

[17 of 47 positions shown; findings below may reference images not displayed]

FINDINGS: Brain: No acute infarct, mass, mass effect, or midline shift. No
hydrocephalus or extra-axial collection. Bilateral basal ganglia
lacunar infarcts. Periventricular white matter changes, likely the
sequela of chronic small vessel ischemic disease.

Vascular: No hyperdense vessel.

Skull: Hyperostosis frontalis. Negative for fracture or focal
lesion.

Sinuses/Orbits: Negative.

Other: The mastoids are well aerated.
IMPRESSION: No acute intracranial process.

## 2021-02-16 ENCOUNTER — Encounter: Payer: Self-pay | Admitting: Hematology

## 2021-02-16 ENCOUNTER — Other Ambulatory Visit: Payer: Self-pay

## 2021-02-16 ENCOUNTER — Inpatient Hospital Stay: Payer: Medicare Other | Attending: Nurse Practitioner

## 2021-02-16 ENCOUNTER — Inpatient Hospital Stay: Payer: Medicare Other

## 2021-02-16 ENCOUNTER — Inpatient Hospital Stay (HOSPITAL_BASED_OUTPATIENT_CLINIC_OR_DEPARTMENT_OTHER): Payer: Medicare Other | Admitting: Hematology

## 2021-02-16 VITALS — BP 150/73 | HR 83 | Temp 98.4°F | Resp 18 | Ht 64.0 in | Wt 171.7 lb

## 2021-02-16 VITALS — BP 130/47 | HR 79 | Temp 97.6°F | Resp 18

## 2021-02-16 DIAGNOSIS — C50812 Malignant neoplasm of overlapping sites of left female breast: Secondary | ICD-10-CM | POA: Diagnosis present

## 2021-02-16 DIAGNOSIS — E1122 Type 2 diabetes mellitus with diabetic chronic kidney disease: Secondary | ICD-10-CM | POA: Diagnosis not present

## 2021-02-16 DIAGNOSIS — Z171 Estrogen receptor negative status [ER-]: Secondary | ICD-10-CM

## 2021-02-16 DIAGNOSIS — C50412 Malignant neoplasm of upper-outer quadrant of left female breast: Secondary | ICD-10-CM

## 2021-02-16 DIAGNOSIS — N189 Chronic kidney disease, unspecified: Secondary | ICD-10-CM | POA: Insufficient documentation

## 2021-02-16 DIAGNOSIS — Z95828 Presence of other vascular implants and grafts: Secondary | ICD-10-CM

## 2021-02-16 DIAGNOSIS — Z5112 Encounter for antineoplastic immunotherapy: Secondary | ICD-10-CM | POA: Insufficient documentation

## 2021-02-16 DIAGNOSIS — Z79899 Other long term (current) drug therapy: Secondary | ICD-10-CM | POA: Diagnosis not present

## 2021-02-16 DIAGNOSIS — Z803 Family history of malignant neoplasm of breast: Secondary | ICD-10-CM | POA: Diagnosis not present

## 2021-02-16 DIAGNOSIS — K5900 Constipation, unspecified: Secondary | ICD-10-CM | POA: Diagnosis not present

## 2021-02-16 DIAGNOSIS — C7800 Secondary malignant neoplasm of unspecified lung: Secondary | ICD-10-CM | POA: Diagnosis not present

## 2021-02-16 DIAGNOSIS — I2510 Atherosclerotic heart disease of native coronary artery without angina pectoris: Secondary | ICD-10-CM | POA: Insufficient documentation

## 2021-02-16 DIAGNOSIS — W07XXXA Fall from chair, initial encounter: Secondary | ICD-10-CM | POA: Insufficient documentation

## 2021-02-16 DIAGNOSIS — I6381 Other cerebral infarction due to occlusion or stenosis of small artery: Secondary | ICD-10-CM | POA: Insufficient documentation

## 2021-02-16 DIAGNOSIS — D631 Anemia in chronic kidney disease: Secondary | ICD-10-CM | POA: Insufficient documentation

## 2021-02-16 DIAGNOSIS — M4800 Spinal stenosis, site unspecified: Secondary | ICD-10-CM | POA: Insufficient documentation

## 2021-02-16 DIAGNOSIS — R634 Abnormal weight loss: Secondary | ICD-10-CM | POA: Diagnosis not present

## 2021-02-16 DIAGNOSIS — I129 Hypertensive chronic kidney disease with stage 1 through stage 4 chronic kidney disease, or unspecified chronic kidney disease: Secondary | ICD-10-CM | POA: Insufficient documentation

## 2021-02-16 DIAGNOSIS — I6782 Cerebral ischemia: Secondary | ICD-10-CM | POA: Diagnosis not present

## 2021-02-16 DIAGNOSIS — I700 Atherosclerosis of aorta: Secondary | ICD-10-CM | POA: Diagnosis not present

## 2021-02-16 DIAGNOSIS — G8929 Other chronic pain: Secondary | ICD-10-CM | POA: Insufficient documentation

## 2021-02-16 LAB — CMP (CANCER CENTER ONLY)
ALT: 18 U/L (ref 0–44)
AST: 32 U/L (ref 15–41)
Albumin: 3.4 g/dL — ABNORMAL LOW (ref 3.5–5.0)
Alkaline Phosphatase: 70 U/L (ref 38–126)
Anion gap: 10 (ref 5–15)
BUN: 16 mg/dL (ref 8–23)
CO2: 24 mmol/L (ref 22–32)
Calcium: 9.8 mg/dL (ref 8.9–10.3)
Chloride: 109 mmol/L (ref 98–111)
Creatinine: 1.79 mg/dL — ABNORMAL HIGH (ref 0.44–1.00)
GFR, Estimated: 28 mL/min — ABNORMAL LOW (ref >60)
Glucose, Bld: 100 mg/dL — ABNORMAL HIGH (ref 70–99)
Potassium: 3.9 mmol/L (ref 3.5–5.1)
Sodium: 143 mmol/L (ref 135–145)
Total Bilirubin: 0.4 mg/dL (ref 0.3–1.2)
Total Protein: 7.3 g/dL (ref 6.5–8.1)

## 2021-02-16 LAB — CBC WITH DIFFERENTIAL (CANCER CENTER ONLY)
Abs Immature Granulocytes: 0.01 10*3/uL (ref 0.00–0.07)
Basophils Absolute: 0 10*3/uL (ref 0.0–0.1)
Basophils Relative: 1 %
Eosinophils Absolute: 0.4 10*3/uL (ref 0.0–0.5)
Eosinophils Relative: 6 %
HCT: 31.6 % — ABNORMAL LOW (ref 36.0–46.0)
Hemoglobin: 10.6 g/dL — ABNORMAL LOW (ref 12.0–15.0)
Immature Granulocytes: 0 %
Lymphocytes Relative: 33 %
Lymphs Abs: 1.9 10*3/uL (ref 0.7–4.0)
MCH: 23.9 pg — ABNORMAL LOW (ref 26.0–34.0)
MCHC: 33.5 g/dL (ref 30.0–36.0)
MCV: 71.2 fL — ABNORMAL LOW (ref 80.0–100.0)
Monocytes Absolute: 0.4 10*3/uL (ref 0.1–1.0)
Monocytes Relative: 7 %
Neutro Abs: 3.1 10*3/uL (ref 1.7–7.7)
Neutrophils Relative %: 53 %
Platelet Count: 272 10*3/uL (ref 150–400)
RBC: 4.44 MIL/uL (ref 3.87–5.11)
RDW: 15.9 % — ABNORMAL HIGH (ref 11.5–15.5)
WBC Count: 5.9 10*3/uL (ref 4.0–10.5)
nRBC: 0 % (ref 0.0–0.2)

## 2021-02-16 MED ORDER — SODIUM CHLORIDE 0.9 % IV SOLN
3.0000 mg/kg | Freq: Once | INTRAVENOUS | Status: AC
  Administered 2021-02-16: 240 mg via INTRAVENOUS
  Filled 2021-02-16: qty 5

## 2021-02-16 MED ORDER — SODIUM CHLORIDE 0.9% FLUSH
10.0000 mL | Freq: Once | INTRAVENOUS | Status: AC
  Administered 2021-02-16: 10 mL

## 2021-02-16 MED ORDER — ACETAMINOPHEN 325 MG PO TABS
650.0000 mg | ORAL_TABLET | Freq: Once | ORAL | Status: AC
  Administered 2021-02-16: 650 mg via ORAL
  Filled 2021-02-16: qty 2

## 2021-02-16 MED ORDER — SODIUM CHLORIDE 0.9% FLUSH
10.0000 mL | INTRAVENOUS | Status: DC | PRN
  Administered 2021-02-16: 10 mL

## 2021-02-16 MED ORDER — SODIUM CHLORIDE 0.9 % IV SOLN: Freq: Once | INTRAVENOUS | Status: AC

## 2021-02-16 MED ORDER — DIPHENHYDRAMINE HCL 25 MG PO CAPS
50.0000 mg | ORAL_CAPSULE | Freq: Once | ORAL | Status: AC
  Administered 2021-02-16: 50 mg via ORAL
  Filled 2021-02-16: qty 2

## 2021-02-16 MED ORDER — HEPARIN SOD (PORK) LOCK FLUSH 100 UNIT/ML IV SOLN
500.0000 [IU] | Freq: Once | INTRAVENOUS | Status: AC | PRN
  Administered 2021-02-16: 500 [IU]

## 2021-02-16 NOTE — Patient Instructions (Signed)
[facility]   Discharge Instructions: Thank you for choosing [facility] to provide your oncology and hematology care.   If you have a lab appointment with the [facility], please go directly to the [facility] and check in at the registration area.   Wear comfortable clothing and clothing appropriate for easy access to any Portacath or PICC line.   We strive to give you quality time with your provider. You may need to reschedule your appointment if you arrive late (15 or more minutes).  Arriving late affects you and other patients whose appointments are after yours.  Also, if you miss three or more appointments without notifying the office, you may be dismissed from the clinic at the providers discretion.      For prescription refill requests, have your pharmacy contact our office and allow 72 hours for refills to be completed.    Today you received the following chemotherapy and/or immunotherapy agent: Trastuzumab- Emtansine (Kadcyla)   To help prevent nausea and vomiting after your treatment, we encourage you to take your nausea medication as directed.  BELOW ARE SYMPTOMS THAT SHOULD BE REPORTED IMMEDIATELY: *FEVER GREATER THAN 100.4 F (38 C) OR HIGHER *CHILLS OR SWEATING *NAUSEA AND VOMITING THAT IS NOT CONTROLLED WITH YOUR NAUSEA MEDICATION *UNUSUAL SHORTNESS OF BREATH *UNUSUAL BRUISING OR BLEEDING *URINARY PROBLEMS (pain or burning when urinating, or frequent urination) *BOWEL PROBLEMS (unusual diarrhea, constipation, pain near the anus) TENDERNESS IN MOUTH AND THROAT WITH OR WITHOUT PRESENCE OF ULCERS (sore throat, sores in mouth, or a toothache) UNUSUAL RASH, SWELLING OR PAIN  UNUSUAL VAGINAL DISCHARGE OR ITCHING   Items with * indicate a potential emergency and should be followed up as soon as possible or go to the Emergency Department if any problems should occur.  Please show the CHEMOTHERAPY ALERT CARD or IMMUNOTHERAPY ALERT  CARD at check-in to the Emergency Department and triage nurse.  Should you have questions after your visit or need to cancel or reschedule your appointment, please contact [facility]  Dept: [phone]  and follow the prompts.  Office hours are 8:00 a.m. to 4:30 p.m. Monday - Friday. Please note that voicemails left after 4:00 p.m. may not be returned until the following business day.  We are closed weekends and major holidays. You have access to a nurse at all times for urgent questions. Please call the main number to the clinic Dept: [phone] and follow the prompts.   For any non-urgent questions, you may also contact your provider using MyChart. We now offer e-Visits for anyone 88 and older to request care online for non-urgent symptoms. For details visit [email or web address].   Also download the MyChart app! Go to the app store, search "MyChart", open the app, select [facility], and log in with your MyChart username and password.  Due to Covid, a mask is required upon entering the hospital/clinic. If you do not have a mask, one will be given to you upon arrival. For doctor visits, patients may have 1 support person aged 4 or older with them. For treatment visits, patients cannot have anyone with them due to current Covid guidelines and our immunocompromised population.

## 2021-02-16 NOTE — Progress Notes (Signed)
Per Dr. Burr Medico, "OK To Treat w/elevated Scr+ today"

## 2021-02-16 NOTE — Progress Notes (Signed)
[facility]   [phone] [phone]) [phone]   Clinic Follow up Note   Patient Care Team: Nolene Ebbs, MD as PCP - General (Internal Medicine) Mauro Kaufmann, RN as Oncology Nurse Navigator Rockwell Germany, RN as Oncology Nurse Navigator  Date of Service:  02/16/2021  CHIEF COMPLAINT: f/u of metastatic breast cancer  CURRENT THERAPY:  Kadcyla every 3 weeks, started on 09/11/20  ASSESSMENT & PLAN:  Christy [name] is a 80 y.o. female with   1. Malignant neoplasm of overlapping sites in the central and upper out quadrant left breast, invasive ductal carcinoma, cT2N3M1 stage IV, grade 2-3, ER/PR negative HER2 positive, MIB-1 of 20% - with nodal and pulmonary metastases -She initially self palpated a left breast mass in 06/2020, possibly earlier.  Work-up showed 2 masses in the left upper outer breast and 3 abnormal lymph nodes.  Biopsy of the 2 masses and 1 lymph node all showed invasive ductal carcinoma, grade 2-3, ER/PR negative and HER2 positive. -PET scan 08/28/20 shows the hypermetabolic known left breast malignancy with hypermetabolic left axillary, retropectoral, and supraclavicular adenopathy.  Additionally there are several small 3-6 mm pulmonary nodules with mild hypermetabolic activity, felt to be consistent with pulmonary metastasis.  -baseline labs which shows CKD and mild anemia, CA 27.29 is normal. Baseline echo EF 55-60% -She was started on first-line systemic Kadcyla, given once every 3 weeks, on 09/11/20. She tolerates well with some fatigue. -restaging chest CT 11/30/20 showed positive response to therapy, with decrease in size of left breast mass, lymph nodes, and right lung nodules.  -I decreased her Kadcyla dose with C4 due to fatigue. she is tolerating it better  -repeat echo 12/24/20 was stable. -Labs reviewed, overall adequate for treatment.    2. Fall -she recently fell off a chair while reaching for a cabinet. Daughter reports she hit  her mid-low back and her head. This has caused pain with walking, making walking more difficult (she has leg weakness at baseline) -lumbar spine x-ray on 11/22 and head CT on 12/8 were both negative   3. Symptom management: Constipation, difficulty urinating, diminished appetite/weight loss, fatigue -chronic, but worsening -has not had a bowel movement in almost a week. She tried OTC medication but only took one a day. It did not help. I recommend either increasing the dose or using Magnesium Citrate. I previously told her to increase her miralax to twice a day. -also reports only urinating once a day, despite feeling the need to go -she does not eat enough. Given her DM, I recommend she use Glucerna for nutritional supplement.   4. Genetics -Her sister had metastatic breast cancer in her 72s, and that sister's daughter possibly have breast cancer. -Due to family history and ER/PR negative disease, patient qualifies for genetics. She has not been evaluated or tested yet. -Patient's daughter understands her breast cancer risk and the importance of annual mammography.   5. Spinal stenosis -Reportedly diagnosed in [place], [place].  Patient has chronic back pain and left leg weakness at baseline -She now also has right leg weakness. She is ambulating with a walker. -PET is negative for spinal mets and brain MRI shows subacute lacunar infarct without metastatic disease    6. HTN, DM -on metformin, amlodipine, and olmesartan -f/up PCP Dr. Jeanie Cooks -Denies baseline neuropathy   7. Subacute stroke -Her brain MRI showed subacute lacunar infarct of the left basal ganglia, she has no clinical symptoms of stroke. -She was seen by Dr. Mickeal Skinner on  09/17/20. He recommended cholesterol medicine and full-dose aspirin. She was prescribed lipitor at that time.     PLAN: -proceed with C8 Kadcyla with same dose reduction to 59m/kg -f/u and Kadcyla in 3 weeks, with lab/flush and PET scan several days  before   No problem-specific Assessment & Plan notes found for this encounter.   SUMMARY OF ONCOLOGIC HISTORY: Oncology History Overview Note  Cancer Staging Malignant neoplasm of upper-outer quadrant of female breast ([place] Staging form: Breast, AJCC 8th Edition - Clinical stage from 07/31/2020: Stage IV (cT2, cN3, cM1, G2, ER-, PR-, HER2+) - Signed by BAlla Feeling NP on 08/31/2020 Stage prefix: Initial diagnosis Nuclear grade: G2 Histologic grading system: 3 grade system    Malignant neoplasm of upper-outer quadrant of female breast ([place]  07/24/2020 Breast [place]  On physical exam, a very firm mass is identified at the 1:30 position of the LEFT breast 5 cm from the nipple.   IMPRESSION: 1. Highly suspicious 4.3 cm mass at the 1:30 position of the LEFT breast and highly suspicious 2.7 cm mass at the 2 o'clock position of the LEFT breast, with the 2 masses encompassing an area measuring at least 6.1 cm. 2. 3 abnormal LEFT axillary lymph nodes with cortical thickening, tissue sampling of 1 of these lymph nodes recommended. 3. Anterior LEFT breast skin thickening nonspecific but may reflect dermal involvement. 4. No mammographic evidence of RIGHT breast malignancy.   07/31/2020 Initial Biopsy   Diagnosis 1. Breast, left, needle core biopsy, 1 :30 pm, 5cmfn - INVASIVE DUCTAL CARCINOMA, SEE COMMENT. 2. Breast, left, needle core biopsy, 2 o'clock, 8cmfn - INVASIVE DUCTAL CARCINOMA, SEE COMMENT. - DUCTAL CARCINOMA IN SITU. 3. Lymph node, needle/core biopsy, left axillary - METASTATIC CARCINOMA IN A LYMPH NODE. Microscopic Comment 1. and 3. The carcinoma in both parts has a similar morphology and appears grade 2-3. The DCIS in part 2 is high-grade with necrosis. The carcinoma measures 15 mm (part 1) and 14 mm (part 2) in greatest linear extent  2. PROGNOSTIC INDICATORS Results: IMMUNOHISTOCHEMICAL AND MORPHOMETRIC ANALYSIS PERFORMED MANUALLY The tumor cells are POSITIVE for Her2  (3+). Estrogen Receptor: 0%, NEGATIVE Progesterone Receptor: 0%, NEGATIVE Proliferation Marker Ki67: 20%    07/31/2020 Cancer Staging   Staging form: Breast, AJCC 8th Edition - Clinical stage from 07/31/2020: Stage IV (cT2, cN3, cM1, G2, ER-, PR-, HER2+) - Signed by BAlla Feeling NP on 08/31/2020 Stage prefix: Initial diagnosis Nuclear grade: G2 Histologic grading system: 3 grade system    08/13/2020 Initial Diagnosis   Malignant neoplasm of upper-outer quadrant of female breast ([place]   08/28/2020 PET scan   IMPRESSION: Signs of LEFT breast cancer with LEFT axillary and retropectoral adenopathy as well as LEFT supraclavicular adenopathy.   Signs of pulmonary metastatic disease.   Mild asymmetric uptake in the LEFT as compared to the RIGHT adrenal gland. Equivocal but suspicious, attention on follow-up.   Aortic atherosclerosis.   Cystic changes and potential bronchiectasis in the RIGHT lung base likely post infectious or inflammatory. Correlate with any respiratory symptoms. Comparison with prior imaging may be helpful with attention on follow-up.   08/28/2020 Imaging   BRAIN MRI IMPRESSION: 1. Subacute lacunar infarct of the left basal ganglia with no malignant hemorrhagic transformation or mass effect. Underlying chronic bilateral basal ganglia ischemia. 2. No metastatic disease or other acute intracranial abnormality identified.   08/28/2020 Imaging   Breast MRI   IMPRESSION: 1. 8.1 x 8.1 x 5.1 cm area of biopsy-proven malignancy in the  central, upper outer and lower outer quadrants of the left breast. 2. Diffuse anterior left breast skin thickening without abnormal enhancement. This may represent thickening due to lymphedema associated with lymphatic obstruction. 3. Metastatic level 1 and level 2 left axillary adenopathy. 4. No evidence of malignancy on the right.   09/11/2020 -  Chemotherapy   Patient is on Treatment Plan : BREAST ADO-Trastuzumab Emtansine (Kadcyla)  q21d     11/30/2020 Imaging   CT Chest w/o contrast  IMPRESSION: 1. Interval response to therapy. The left breast mass has mildly decreased in size in the interval. 2. Decrease in size of left axillary and left retropectoral lymph nodes. 3. Small right lung nodules stable to improved. 4. Coronary artery calcifications noted. 5. Aortic Atherosclerosis (ICD10-I70.0).      INTERVAL HISTORY:  Christy [name] is here for a follow up of metastatic breast cancer. She was last seen by me on 01/26/21. She presents to the clinic accompanied by her daughter. She reports she is having itching around her neck. She also reports her right eye waters frequently.   All other systems were reviewed with the patient and are negative.  MEDICAL HISTORY:  Past Medical History:  Diagnosis Date   Diabetes mellitus without complication ([place])    Hyperlipidemia    Hypertension     SURGICAL HISTORY: Past Surgical History:  Procedure Laterality Date   FOOT FRACTURE SURGERY Right    IR IMAGING GUIDED PORT INSERTION  09/22/2020   IR RADIOLOGIST EVAL & MGMT  09/25/2020    I have reviewed the social history and family history with the patient and they are unchanged from previous note.  ALLERGIES:  has No Known Allergies.  MEDICATIONS:  Current Outpatient Medications  Medication Sig Dispense Refill   amLODipine (NORVASC) 5 MG tablet  (Patient not taking: Reported on 09/17/2020)     aspirin EC 325 MG tablet Take 1 tablet (325 mg total) by mouth daily. 30 tablet 5   atorvastatin (LIPITOR) 20 MG tablet Take 1 tablet (20 mg total) by mouth daily. 30 tablet 5   cetirizine (ZYRTEC ALLERGY) 10 MG tablet Take 1 tablet (10 mg total) by mouth daily. (Patient not taking: Reported on 09/17/2020) 30 tablet 0   clotrimazole (CLOTRIMAZOLE AF) 1 % cream Apply 1 application topically 2 (two) times daily. 30 g 0   fluticasone (FLONASE) 50 MCG/ACT nasal spray Place 1 spray into both nostrils daily. (Patient not taking:  Reported on 09/17/2020) 1 g 0   lidocaine-prilocaine (EMLA) cream Apply 1 application topically as needed. 30 g 2   metFORMIN (GLUCOPHAGE) 500 MG tablet Take 1 tablet (500 mg total) by mouth 2 (two) times daily with a meal. 60 tablet 1   olmesartan (BENICAR) 40 MG tablet      No current facility-administered medications for this visit.   Facility-Administered Medications Ordered in Other Visits  Medication Dose Route Frequency Provider Last Rate Last Admin   heparin lock flush 100 unit/mL  500 Units Intracatheter Once PRN Truitt Merle, MD       sodium chloride flush (NS) 0.9 % injection 10 mL  10 mL Intracatheter PRN Truitt Merle, MD        PHYSICAL EXAMINATION: ECOG PERFORMANCE STATUS: 1 - Symptomatic but completely ambulatory  Vitals:   02/16/21 1141  BP: (!) 150/73  Pulse: 83  Resp: 18  Temp: 98.4 F (36.9 C)  SpO2: 99%   Wt Readings from Last 3 Encounters:  02/16/21 171 lb 11.2 oz (77.9 kg)  01/26/21 172 lb 11.2 oz (78.3 kg)  01/05/21 172 lb 9.6 oz (78.3 kg)     GENERAL:alert, no distress and comfortable SKIN: skin color normal, no rashes or significant lesions EYES: normal, Conjunctiva are pink and non-injected, sclera clear  NEURO: alert & oriented x 3 with fluent speech  LABORATORY DATA:  I have reviewed the data as listed CBC Latest Ref Rng & Units 02/16/2021 01/26/2021 01/05/2021  WBC 4.0 - 10.5 K/uL 5.9 6.7 6.4  Hemoglobin 12.0 - 15.0 g/dL 10.6(L) 11.0(L) 10.8(L)  Hematocrit 36.0 - 46.0 % 31.6(L) 33.6(L) 33.0(L)  Platelets 150 - 400 K/uL 272 260 257     CMP Latest Ref Rng & Units 02/16/2021 01/26/2021 01/05/2021  Glucose 70 - 99 mg/dL 100(H) 101(H) 100(H)  BUN 8 - 23 mg/dL _0 Creatinine 0.44 - 1.00 mg/dL 1.79(H) 1.94(H) 1.64(H)  Sodium 135 - 145 mmol/L 143 145 143  Potassium 3.5 - 5.1 mmol/L 3.9 4.1 3.9  Chloride 98 - 111 mmol/L 109 110 109  CO2 22 - 32 mmol/L _1 Calcium 8.9 - 10.3 mg/dL 9.8 10.7(H) 9.8  Total Protein 6.5 - 8.1 g/dL 7.3 7.4 7.1   Total Bilirubin 0.3 - 1.2 mg/dL 0.4 0.4 0.4  Alkaline Phos 38 - 126 U/L 70 77 66  AST 15 - 41 U/L 32 27 24  ALT 0 - 44 U/L _2 RADIOGRAPHIC STUDIES: I have personally reviewed the radiological images as listed and agreed with the findings in the report. No results found.    Orders Placed This Encounter  Procedures   NM PET Image Restage (PS) Skull Base to Thigh (F-18 FDG)    Standing Status:   Future    Standing Expiration Date:   02/16/2022    Order Specific Question:   If indicated for the ordered procedure, I authorize the administration of a radiopharmaceutical per Radiology protocol    Answer:   Yes    Order Specific Question:   Preferred imaging location?    Answer:   [facility]    Order Specific Question:   Radiology Contrast Protocol - do NOT remove file path    Answer:   [email or web address]   All questions were answered. The patient knows to call the clinic with any problems, questions or concerns. No barriers to learning was detected. The total time spent in the appointment was 30 minutes.     Truitt Merle, MD 02/16/2021   I, Wilburn Mylar, am acting as scribe for Truitt Merle, MD.   I have reviewed the above documentation for accuracy and completeness, and I agree with the above.

## 2021-02-17 LAB — CANCER ANTIGEN 27.29: CA 27.29: 36.3 U/mL (ref 0.0–38.6)

## 2021-02-19 ENCOUNTER — Other Ambulatory Visit: Payer: Self-pay

## 2021-03-03 ENCOUNTER — Ambulatory Visit (HOSPITAL_COMMUNITY): Payer: Medicare Other

## 2021-03-04 ENCOUNTER — Other Ambulatory Visit: Payer: Self-pay

## 2021-03-04 ENCOUNTER — Ambulatory Visit (HOSPITAL_COMMUNITY)
Admission: RE | Admit: 2021-03-04 | Discharge: 2021-03-04 | Disposition: A | Discharge status: Home / Self Care | Payer: Medicare Other | Source: Ambulatory Visit | Attending: Hematology | Admitting: Hematology

## 2021-03-04 DIAGNOSIS — Z171 Estrogen receptor negative status [ER-]: Secondary | ICD-10-CM | POA: Diagnosis present

## 2021-03-04 DIAGNOSIS — C50412 Malignant neoplasm of upper-outer quadrant of left female breast: Secondary | ICD-10-CM | POA: Diagnosis not present

## 2021-03-04 LAB — GLUCOSE, CAPILLARY: Glucose-Capillary: 93 mg/dL (ref 70–99)

## 2021-03-04 IMAGING — CT NM PET TUM IMG RESTAG (PS) SKULL BASE T - THIGH
1 of 8 series · 1 of 25 positions shown · non-contrast
Comparison: Multiple exams, including chest CT [DATE] and
PET-CT [DATE]

CLINICAL DATA: Subsequent treatment strategy for left upper outer
quadrant breast cancer. Ongoing Kadcyla therapy.

EXAM:
NUCLEAR MEDICINE PET SKULL BASE TO THIGH
TECHNIQUE: 9.3 mCi F-18 FDG was injected intravenously. Full-ring PET imaging
was performed from the skull base to thigh after the radiotracer. CT
data was obtained and used for attenuation correction and anatomic
localization.
Fasting blood glucose: 93 mg/dl

[Series 4: ct sk_thigh 5.0 bf37 · axial · 5.0mm · 0.98mm/px · 1 of 223 slices shown]
[im 223/223  brain]
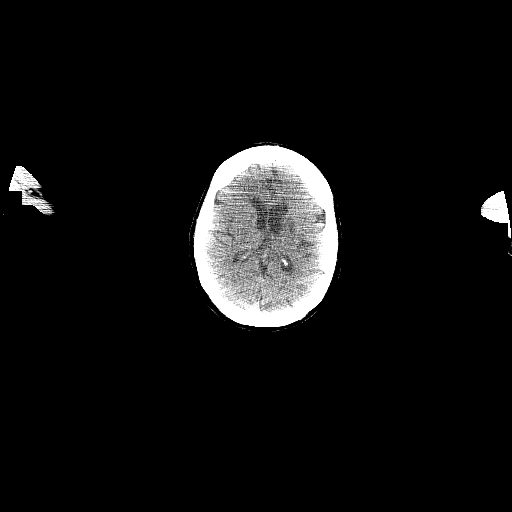

[1 of 25 positions shown; findings below may reference images not displayed]

FINDINGS: Mediastinal blood pool activity: SUV max

Liver activity: SUV max NA

NECK: No significant abnormal hypermetabolic activity in this
region.

Incidental CT findings: Remote left basal ganglia infarct.

CHEST: Marked reduction in size and resolution of prior
hypermetabolic activity in the left supraclavicular, subpectoral,
and axillary lymph nodes. Index left axillary node 0.3 cm in short
axis on image 64 series 4 (formerly 1.0 cm) with maximum SUV
(formerly 9.4).

Previous hypermetabolic subpleural nodules in the right lung have
essentially resolved. For example, the previous 5 mm right lower
lobe nodule that had a maximum SUV of 3.5 on the prior PET-CT is no
longer visible.

Stable varicoid bronchiectasis in the right lower lobe with
associated plugging on images 46 through 61 of series 8, low-grade
activity in this region with maximum SUV of 2.0 is likely
inflammatory.

Ground-glass opacity medially in the right lower lobe measuring
about 1.7 by 2.5 cm on image 47 of series 8 is new with mild
hypermetabolic activity, maximum SUV 3.6, likely inflammatory given
the morphology and appearance. This would not be a typical
appearance for metastatic breast cancer.

Substantially reduced size and lobularity of the left upper breast
mass which is difficult to separate from the glandular tissues, with
soft tissue attenuation in the region measuring about 1.4 cm
anterior-posterior, formerly 2.8 cm. Moreover, maximum SUV in this
vicinity is 2.7, formerly 14.1. For comparison purposes, maximum SUV
in the contralateral breast tissues in this region is about 1.3.

Incidental CT findings: Right Port-A-Cath tip: Right atrium.
Coronary, aortic arch, and branch vessel atherosclerotic vascular
disease. Mitral valve calcifications. Mild scarring at the right
lung apex.

ABDOMEN/PELVIS: Substantial physiologic activity in bowel.
Photopenic cyst of the left kidney upper pole.

Incidental CT findings: Atherosclerosis is present, including
aortoiliac atherosclerotic disease. Punctate nonobstructive left
renal calculi.

SKELETON: No significant abnormal hypermetabolic activity in this
region.

Incidental CT findings: Lower lumbar spondylosis and degenerative
disc disease, particularly at L4-5 and L5-S1, as shown on prior
lumbar MRI from [DATE].
IMPRESSION: 1. Overall marked improvement compared to the prior PET-CT, with
resolution of hypermetabolic activity and pathologic enlargement of
the previously involved lymph nodes; lack of hypermetabolic activity
and nonvisualization of prior right-sided lung nodules, and
substantial reduction in size and activity in the left breast
primary.
2. New ground-glass opacity medially in the right lower lobe with
low-grade metabolic activity, highly likely to be inflammatory
(alveolitis) given the morphology and appearance.
3. Other imaging findings of potential clinical significance: Aortic
Atherosclerosis ([ML]-[ML]). Coronary atherosclerosis. Mitral
valve calcifications. Punctate nonobstructive left renal calculi.
Lower lumbar impingement due to chronic spondylosis and degenerative
disc disease. Remote left basal ganglia infarct.

## 2021-03-04 MED ORDER — FLUDEOXYGLUCOSE F - 18 (FDG) INJECTION
8.5000 | Freq: Once | INTRAVENOUS | Status: AC | PRN
  Administered 2021-03-04: 13:00:00 8.5 via INTRAVENOUS

## 2021-03-07 ENCOUNTER — Encounter: Payer: Self-pay | Admitting: Hematology

## 2021-03-09 ENCOUNTER — Telehealth: Payer: Self-pay | Admitting: Hematology

## 2021-03-09 NOTE — Telephone Encounter (Signed)
Rescheduled 01/04 appointment due to patient feeling unwell, rescheduled appointment to 01/11 due to patient's request. Called and left a voicemail.

## 2021-03-10 ENCOUNTER — Inpatient Hospital Stay: Payer: Medicare Other

## 2021-03-10 ENCOUNTER — Inpatient Hospital Stay: Payer: Medicare Other | Admitting: Hematology

## 2021-03-10 DIAGNOSIS — C50412 Malignant neoplasm of upper-outer quadrant of left female breast: Secondary | ICD-10-CM

## 2021-03-12 ENCOUNTER — Encounter: Payer: Self-pay | Admitting: Hematology

## 2021-03-16 ENCOUNTER — Other Ambulatory Visit: Payer: Self-pay

## 2021-03-16 NOTE — Progress Notes (Signed)
Pt LVM on 03/15/2021 but did not state what she needed.  Returned telephone call on 03/16/2021 and LVM stating returning pt's call and to please contact Dr. Ernestina Penna office.  Requested pt to please leave a detailed message stating the purpose of her call with her name, DOB, and valid call back telephone number should she get our voicemail.  Awaiting return call from pt.

## 2021-03-17 ENCOUNTER — Ambulatory Visit: Payer: Self-pay

## 2021-03-17 ENCOUNTER — Ambulatory Visit: Payer: Self-pay | Admitting: Hematology

## 2021-03-17 ENCOUNTER — Encounter: Payer: Self-pay | Admitting: Hematology

## 2021-03-17 ENCOUNTER — Inpatient Hospital Stay: Payer: Medicare Other | Attending: Nurse Practitioner

## 2021-03-17 ENCOUNTER — Other Ambulatory Visit: Payer: Self-pay

## 2021-03-17 ENCOUNTER — Inpatient Hospital Stay: Payer: Medicare Other

## 2021-03-17 ENCOUNTER — Inpatient Hospital Stay (HOSPITAL_BASED_OUTPATIENT_CLINIC_OR_DEPARTMENT_OTHER): Payer: Medicare Other | Admitting: Hematology

## 2021-03-17 VITALS — BP 136/82 | HR 72 | Temp 97.6°F | Resp 18 | Ht 64.0 in | Wt 168.9 lb

## 2021-03-17 DIAGNOSIS — M4800 Spinal stenosis, site unspecified: Secondary | ICD-10-CM | POA: Diagnosis not present

## 2021-03-17 DIAGNOSIS — Z5112 Encounter for antineoplastic immunotherapy: Secondary | ICD-10-CM | POA: Diagnosis present

## 2021-03-17 DIAGNOSIS — Z8673 Personal history of transient ischemic attack (TIA), and cerebral infarction without residual deficits: Secondary | ICD-10-CM | POA: Diagnosis not present

## 2021-03-17 DIAGNOSIS — I6381 Other cerebral infarction due to occlusion or stenosis of small artery: Secondary | ICD-10-CM | POA: Diagnosis not present

## 2021-03-17 DIAGNOSIS — I700 Atherosclerosis of aorta: Secondary | ICD-10-CM | POA: Insufficient documentation

## 2021-03-17 DIAGNOSIS — M4726 Other spondylosis with radiculopathy, lumbar region: Secondary | ICD-10-CM | POA: Insufficient documentation

## 2021-03-17 DIAGNOSIS — M5116 Intervertebral disc disorders with radiculopathy, lumbar region: Secondary | ICD-10-CM | POA: Diagnosis not present

## 2021-03-17 DIAGNOSIS — R634 Abnormal weight loss: Secondary | ICD-10-CM | POA: Insufficient documentation

## 2021-03-17 DIAGNOSIS — E1122 Type 2 diabetes mellitus with diabetic chronic kidney disease: Secondary | ICD-10-CM | POA: Insufficient documentation

## 2021-03-17 DIAGNOSIS — N189 Chronic kidney disease, unspecified: Secondary | ICD-10-CM | POA: Diagnosis not present

## 2021-03-17 DIAGNOSIS — C50812 Malignant neoplasm of overlapping sites of left female breast: Secondary | ICD-10-CM | POA: Insufficient documentation

## 2021-03-17 DIAGNOSIS — I2510 Atherosclerotic heart disease of native coronary artery without angina pectoris: Secondary | ICD-10-CM | POA: Diagnosis not present

## 2021-03-17 DIAGNOSIS — I6782 Cerebral ischemia: Secondary | ICD-10-CM | POA: Diagnosis not present

## 2021-03-17 DIAGNOSIS — C7800 Secondary malignant neoplasm of unspecified lung: Secondary | ICD-10-CM | POA: Diagnosis not present

## 2021-03-17 DIAGNOSIS — Z803 Family history of malignant neoplasm of breast: Secondary | ICD-10-CM | POA: Diagnosis not present

## 2021-03-17 DIAGNOSIS — C50412 Malignant neoplasm of upper-outer quadrant of left female breast: Secondary | ICD-10-CM

## 2021-03-17 DIAGNOSIS — D631 Anemia in chronic kidney disease: Secondary | ICD-10-CM | POA: Diagnosis not present

## 2021-03-17 DIAGNOSIS — Z79899 Other long term (current) drug therapy: Secondary | ICD-10-CM | POA: Diagnosis not present

## 2021-03-17 DIAGNOSIS — K5900 Constipation, unspecified: Secondary | ICD-10-CM | POA: Insufficient documentation

## 2021-03-17 DIAGNOSIS — I129 Hypertensive chronic kidney disease with stage 1 through stage 4 chronic kidney disease, or unspecified chronic kidney disease: Secondary | ICD-10-CM | POA: Diagnosis not present

## 2021-03-17 DIAGNOSIS — R059 Cough, unspecified: Secondary | ICD-10-CM | POA: Insufficient documentation

## 2021-03-17 DIAGNOSIS — Z171 Estrogen receptor negative status [ER-]: Secondary | ICD-10-CM

## 2021-03-17 DIAGNOSIS — N200 Calculus of kidney: Secondary | ICD-10-CM | POA: Diagnosis not present

## 2021-03-17 DIAGNOSIS — G8929 Other chronic pain: Secondary | ICD-10-CM | POA: Diagnosis not present

## 2021-03-17 DIAGNOSIS — Z95828 Presence of other vascular implants and grafts: Secondary | ICD-10-CM

## 2021-03-17 LAB — CBC WITH DIFFERENTIAL (CANCER CENTER ONLY)
Abs Immature Granulocytes: 0.02 10*3/uL (ref 0.00–0.07)
Basophils Absolute: 0 10*3/uL (ref 0.0–0.1)
Basophils Relative: 1 %
Eosinophils Absolute: 0.3 10*3/uL (ref 0.0–0.5)
Eosinophils Relative: 4 %
HCT: 31.3 % — ABNORMAL LOW (ref 36.0–46.0)
Hemoglobin: 10.4 g/dL — ABNORMAL LOW (ref 12.0–15.0)
Immature Granulocytes: 0 %
Lymphocytes Relative: 31 %
Lymphs Abs: 2.4 10*3/uL (ref 0.7–4.0)
MCH: 24 pg — ABNORMAL LOW (ref 26.0–34.0)
MCHC: 33.2 g/dL (ref 30.0–36.0)
MCV: 72.3 fL — ABNORMAL LOW (ref 80.0–100.0)
Monocytes Absolute: 0.5 10*3/uL (ref 0.1–1.0)
Monocytes Relative: 6 %
Neutro Abs: 4.5 10*3/uL (ref 1.7–7.7)
Neutrophils Relative %: 58 %
Platelet Count: 265 10*3/uL (ref 150–400)
RBC: 4.33 MIL/uL (ref 3.87–5.11)
RDW: 15.8 % — ABNORMAL HIGH (ref 11.5–15.5)
WBC Count: 7.9 10*3/uL (ref 4.0–10.5)
nRBC: 0 % (ref 0.0–0.2)

## 2021-03-17 LAB — CMP (CANCER CENTER ONLY)
ALT: 19 U/L (ref 0–44)
AST: 29 U/L (ref 15–41)
Albumin: 3.7 g/dL (ref 3.5–5.0)
Alkaline Phosphatase: 69 U/L (ref 38–126)
Anion gap: 8 (ref 5–15)
BUN: 24 mg/dL — ABNORMAL HIGH (ref 8–23)
CO2: 25 mmol/L (ref 22–32)
Calcium: 9.9 mg/dL (ref 8.9–10.3)
Chloride: 108 mmol/L (ref 98–111)
Creatinine: 1.61 mg/dL — ABNORMAL HIGH (ref 0.44–1.00)
GFR, Estimated: 32 mL/min — ABNORMAL LOW (ref >60)
Glucose, Bld: 85 mg/dL (ref 70–99)
Potassium: 4.1 mmol/L (ref 3.5–5.1)
Sodium: 141 mmol/L (ref 135–145)
Total Bilirubin: 0.3 mg/dL (ref 0.3–1.2)
Total Protein: 7.2 g/dL (ref 6.5–8.1)

## 2021-03-17 MED ORDER — AMOXICILLIN-POT CLAVULANATE 875-125 MG PO TABS: 1.0000 | ORAL_TABLET | Freq: Two times a day (BID) | ORAL | 0 refills | Status: DC

## 2021-03-17 MED ORDER — SODIUM CHLORIDE 0.9% FLUSH
10.0000 mL | Freq: Once | INTRAVENOUS | Status: AC
  Administered 2021-03-17: 10 mL

## 2021-03-17 MED ORDER — DIPHENHYDRAMINE HCL 25 MG PO CAPS
50.0000 mg | ORAL_CAPSULE | Freq: Once | ORAL | Status: AC
  Administered 2021-03-17: 50 mg via ORAL
  Filled 2021-03-17: qty 2

## 2021-03-17 MED ORDER — ACETAMINOPHEN 325 MG PO TABS
650.0000 mg | ORAL_TABLET | Freq: Once | ORAL | Status: AC
  Administered 2021-03-17: 650 mg via ORAL
  Filled 2021-03-17: qty 2

## 2021-03-17 MED ORDER — SODIUM CHLORIDE 0.9 % IV SOLN: Freq: Once | INTRAVENOUS | Status: AC

## 2021-03-17 MED ORDER — SODIUM CHLORIDE 0.9% FLUSH
10.0000 mL | INTRAVENOUS | Status: DC | PRN
  Administered 2021-03-17: 10 mL

## 2021-03-17 MED ORDER — SODIUM CHLORIDE 0.9 % IV SOLN
3.0000 mg/kg | Freq: Once | INTRAVENOUS | Status: AC
  Administered 2021-03-17: 240 mg via INTRAVENOUS
  Filled 2021-03-17: qty 8

## 2021-03-17 MED ORDER — HEPARIN SOD (PORK) LOCK FLUSH 100 UNIT/ML IV SOLN
500.0000 [IU] | Freq: Once | INTRAVENOUS | Status: AC | PRN
  Administered 2021-03-17: 500 [IU]

## 2021-03-17 NOTE — Progress Notes (Signed)
[facility]   Telephone:(336) [phone] Fax:(336) [phone]   Clinic Follow up Note   Patient Care Team: Nolene Ebbs, MD as PCP - General (Internal Medicine) Mauro Kaufmann, RN as Oncology Nurse Navigator Rockwell Germany, RN as Oncology Nurse Navigator  Date of Service:  03/17/2021  CHIEF COMPLAINT: f/u of metastatic breast cancer  CURRENT THERAPY:  Kadcyla every 3 weeks, started on 09/11/20  ASSESSMENT & PLAN:  Christy [name] is a 81 y.o. female with   1. Malignant neoplasm of overlapping sites in the central and upper out quadrant left breast, invasive ductal carcinoma, cT2N3M1 stage IV, grade 2-3, ER/PR negative HER2 positive, MIB-1 of 20% - with nodal and pulmonary metastases -She initially self palpated a left breast mass in 06/2020, possibly earlier.  Work-up showed 2 masses in the left upper outer breast and 3 abnormal lymph nodes.  Biopsy of the 2 masses and 1 lymph node all showed invasive ductal carcinoma, grade 2-3, ER/PR negative and HER2 positive. -PET scan 08/28/20 shows the hypermetabolic known left breast malignancy with hypermetabolic left axillary, retropectoral, and supraclavicular adenopathy.  Additionally there are several small 3-6 mm pulmonary nodules with mild hypermetabolic activity, felt to be consistent with pulmonary metastasis.  -baseline labs which shows CKD and mild anemia, CA 27.29 is normal. Baseline echo EF 55-60% -She was started on first-line systemic Kadcyla, given once every 3 weeks, on 09/11/20. She tolerates well with some fatigue, dose decreased with C4 -repeat echo 12/24/20 was stable. -PET on 03/04/21 showed marked improvement, no new lesions. I reviewed the images with her in person today. -Labs reviewed, overall adequate for treatment. will continue    2. Fall -she recently fell off a chair while reaching for a cabinet. Daughter reports she hit her mid-low back and her head. This has caused pain with walking, making walking more  difficult (she has leg weakness at baseline) -lumbar spine x-ray on 11/22 and head CT on 12/8 were both negative   3. Symptom management: Constipation, difficulty urinating, diminished appetite/weight loss, fatigue, cough -chronic, but worsening -she does not eat enough. Given her DM, I recommend she use Glucerna for nutritional supplement. -she did not mention any difficulties aside from cough today, which is why she requested to postpone her treatment last week. I will call in augmentin for her.   4. Genetics -Her sister had metastatic breast cancer in her 68s, and that sister's daughter possibly have breast cancer. -Due to family history and ER/PR negative disease, patient qualifies for genetics. She has not been evaluated or tested yet. -Patient's daughter understands her breast cancer risk and the importance of annual mammography.   5. Spinal stenosis -Reportedly diagnosed in [place], [place].  Patient has chronic back pain and left leg weakness at baseline -She now also has right leg weakness. She is ambulating with a walker. -PET is negative for spinal mets and brain MRI shows subacute lacunar infarct without metastatic disease    6. HTN, DM -on metformin, amlodipine, and olmesartan -f/up PCP Dr. Jeanie Cooks -Denies baseline neuropathy   7. Subacute stroke -Her brain MRI showed subacute lacunar infarct of the left basal ganglia, she has no clinical symptoms of stroke. -She was seen by Dr. Mickeal Skinner on 09/17/20. He recommended cholesterol medicine and full-dose aspirin. She was prescribed lipitor at that time.     PLAN: -proceed with C9 Kadcyla with same dose reduction to 47m/kg -lab, flush, f/u and Kadcyla every 3 weeks, f/u in 6 weeks  -echo in 5-6  weeks    No problem-specific Assessment & Plan notes found for this encounter.   SUMMARY OF ONCOLOGIC HISTORY: Oncology History Overview Note  Cancer Staging Malignant neoplasm of upper-outer quadrant of female breast ([place]) Staging  form: Breast, AJCC 8th Edition - Clinical stage from 07/31/2020: Stage IV (cT2, cN3, cM1, G2, ER-, PR-, HER2+) - Signed by Alla Feeling, NP on 08/31/2020 Stage prefix: Initial diagnosis Nuclear grade: G2 Histologic grading system: 3 grade system    Malignant neoplasm of upper-outer quadrant of female breast ([place])  07/24/2020 Breast US   On physical exam, a very firm mass is identified at the 1:30 position of the LEFT breast 5 cm from the nipple.   IMPRESSION: 1. Highly suspicious 4.3 cm mass at the 1:30 position of the LEFT breast and highly suspicious 2.7 cm mass at the 2 o'clock position of the LEFT breast, with the 2 masses encompassing an area measuring at least 6.1 cm. 2. 3 abnormal LEFT axillary lymph nodes with cortical thickening, tissue sampling of 1 of these lymph nodes recommended. 3. Anterior LEFT breast skin thickening nonspecific but may reflect dermal involvement. 4. No mammographic evidence of RIGHT breast malignancy.   07/31/2020 Initial Biopsy   Diagnosis 1. Breast, left, needle core biopsy, 1 :30 pm, 5cmfn - INVASIVE DUCTAL CARCINOMA, SEE COMMENT. 2. Breast, left, needle core biopsy, 2 o'clock, 8cmfn - INVASIVE DUCTAL CARCINOMA, SEE COMMENT. - DUCTAL CARCINOMA IN SITU. 3. Lymph node, needle/core biopsy, left axillary - METASTATIC CARCINOMA IN A LYMPH NODE. Microscopic Comment 1. and 3. The carcinoma in both parts has a similar morphology and appears grade 2-3. The DCIS in part 2 is high-grade with necrosis. The carcinoma measures 15 mm (part 1) and 14 mm (part 2) in greatest linear extent  2. PROGNOSTIC INDICATORS Results: IMMUNOHISTOCHEMICAL AND MORPHOMETRIC ANALYSIS PERFORMED MANUALLY The tumor cells are POSITIVE for Her2 (3+). Estrogen Receptor: 0%, NEGATIVE Progesterone Receptor: 0%, NEGATIVE Proliferation Marker Ki67: 20%    07/31/2020 Cancer Staging   Staging form: Breast, AJCC 8th Edition - Clinical stage from 07/31/2020: Stage IV (cT2, cN3, cM1, G2,  ER-, PR-, HER2+) - Signed by Alla Feeling, NP on 08/31/2020 Stage prefix: Initial diagnosis Nuclear grade: G2 Histologic grading system: 3 grade system    08/13/2020 Initial Diagnosis   Malignant neoplasm of upper-outer quadrant of female breast ([place])   08/28/2020 PET scan   IMPRESSION: Signs of LEFT breast cancer with LEFT axillary and retropectoral adenopathy as well as LEFT supraclavicular adenopathy.   Signs of pulmonary metastatic disease.   Mild asymmetric uptake in the LEFT as compared to the RIGHT adrenal gland. Equivocal but suspicious, attention on follow-up.   Aortic atherosclerosis.   Cystic changes and potential bronchiectasis in the RIGHT lung base likely post infectious or inflammatory. Correlate with any respiratory symptoms. Comparison with prior imaging may be helpful with attention on follow-up.   08/28/2020 Imaging   BRAIN MRI IMPRESSION: 1. Subacute lacunar infarct of the left basal ganglia with no malignant hemorrhagic transformation or mass effect. Underlying chronic bilateral basal ganglia ischemia. 2. No metastatic disease or other acute intracranial abnormality identified.   08/28/2020 Imaging   Breast MRI   IMPRESSION: 1. 8.1 x 8.1 x 5.1 cm area of biopsy-proven malignancy in the central, upper outer and lower outer quadrants of the left breast. 2. Diffuse anterior left breast skin thickening without abnormal enhancement. This may represent thickening due to lymphedema associated with lymphatic obstruction. 3. Metastatic level 1 and level 2 left axillary adenopathy.  4. No evidence of malignancy on the right.   09/11/2020 -  Chemotherapy   Patient is on Treatment Plan : BREAST ADO-Trastuzumab Emtansine (Kadcyla) q21d     11/30/2020 Imaging   CT Chest w/o contrast  IMPRESSION: 1. Interval response to therapy. The left breast mass has mildly decreased in size in the interval. 2. Decrease in size of left axillary and left retropectoral  lymph nodes. 3. Small right lung nodules stable to improved. 4. Coronary artery calcifications noted. 5. Aortic Atherosclerosis (ICD10-I70.0).   03/04/2021 PET scan   IMPRESSION: 1. Overall marked improvement compared to the prior PET-CT, with resolution of hypermetabolic activity and pathologic enlargement of the previously involved lymph nodes; lack of hypermetabolic activity and nonvisualization of prior right-sided lung nodules, and substantial reduction in size and activity in the left breast primary. 2. New ground-glass opacity medially in the right lower lobe with low-grade metabolic activity, highly likely to be inflammatory (alveolitis) given the morphology and appearance. 3. Other imaging findings of potential clinical significance: Aortic Atherosclerosis (ICD10-I70.0). Coronary atherosclerosis. Mitral valve calcifications. Punctate nonobstructive left renal calculi. Lower lumbar impingement due to chronic spondylosis and degenerative disc disease. Remote left basal ganglia infarct.      INTERVAL HISTORY:  Christy [name] is here for a follow up of metastatic breast cancer. She was last seen by me on 02/16/21. She presents to the clinic alone. She notes her daughter is at home caring for her son. She reports she is doing well overall, better than last week. She does report continued cough today.   All other systems were reviewed with the patient and are negative.  MEDICAL HISTORY:  Past Medical History:  Diagnosis Date   Diabetes mellitus without complication ([place])    Hyperlipidemia    Hypertension     SURGICAL HISTORY: Past Surgical History:  Procedure Laterality Date   FOOT FRACTURE SURGERY Right    IR IMAGING GUIDED PORT INSERTION  09/22/2020   IR RADIOLOGIST EVAL & MGMT  09/25/2020    I have reviewed the social history and family history with the patient and they are unchanged from previous note.  ALLERGIES:  has No Known Allergies.  MEDICATIONS:  Current  Outpatient Medications  Medication Sig Dispense Refill   amoxicillin-clavulanate (AUGMENTIN) 875-125 MG tablet Take 1 tablet by mouth 2 (two) times daily. 14 tablet 0   amLODipine (NORVASC) 5 MG tablet  (Patient not taking: Reported on 09/17/2020)     aspirin EC 325 MG tablet Take 1 tablet (325 mg total) by mouth daily. 30 tablet 5   atorvastatin (LIPITOR) 20 MG tablet Take 1 tablet (20 mg total) by mouth daily. 30 tablet 5   cetirizine (ZYRTEC ALLERGY) 10 MG tablet Take 1 tablet (10 mg total) by mouth daily. (Patient not taking: Reported on 09/17/2020) 30 tablet 0   clotrimazole (CLOTRIMAZOLE AF) 1 % cream Apply 1 application topically 2 (two) times daily. 30 g 0   fluticasone (FLONASE) 50 MCG/ACT nasal spray Place 1 spray into both nostrils daily. (Patient not taking: Reported on 09/17/2020) 1 g 0   lidocaine-prilocaine (EMLA) cream Apply 1 application topically as needed. 30 g 2   metFORMIN (GLUCOPHAGE) 500 MG tablet Take 1 tablet (500 mg total) by mouth 2 (two) times daily with a meal. 60 tablet 1   olmesartan (BENICAR) 40 MG tablet      No current facility-administered medications for this visit.    PHYSICAL EXAMINATION: ECOG PERFORMANCE STATUS: 1 - Symptomatic but completely ambulatory  Vitals:  03/17/21 1243  BP: 136/82  Pulse: 72  Resp: 18  Temp: 97.6 F (36.4 C)  SpO2: 98%   Wt Readings from Last 3 Encounters:  03/17/21 168 lb 14.4 oz (76.6 kg)  02/16/21 171 lb 11.2 oz (77.9 kg)  01/26/21 172 lb 11.2 oz (78.3 kg)     GENERAL:alert, no distress and comfortable SKIN: skin color normal, no rashes or significant lesions EYES: normal, Conjunctiva are pink and non-injected, sclera clear  NEURO: alert & oriented x 3 with fluent speech  LABORATORY DATA:  I have reviewed the data as listed CBC Latest Ref Rng & Units 03/17/2021 02/16/2021 01/26/2021  WBC 4.0 - 10.5 K/uL 7.9 5.9 6.7  Hemoglobin 12.0 - 15.0 g/dL 10.4(L) 10.6(L) 11.0(L)  Hematocrit 36.0 - 46.0 % 31.3(L) 31.6(L)  33.6(L)  Platelets 150 - 400 K/uL 265 272 260     CMP Latest Ref Rng & Units 03/17/2021 02/16/2021 01/26/2021  Glucose 70 - 99 mg/dL 85 100(H) 101(H)  BUN 8 - 23 mg/dL 24(H) 16 23  Creatinine 0.44 - 1.00 mg/dL 1.61(H) 1.79(H) 1.94(H)  Sodium 135 - 145 mmol/L 141 143 145  Potassium 3.5 - 5.1 mmol/L 4.1 3.9 4.1  Chloride 98 - 111 mmol/L 108 109 110  CO2 22 - 32 mmol/L 25 24 25   Calcium 8.9 - 10.3 mg/dL 9.9 9.8 10.7(H)  Total Protein 6.5 - 8.1 g/dL 7.2 7.3 7.4  Total Bilirubin 0.3 - 1.2 mg/dL 0.3 0.4 0.4  Alkaline Phos 38 - 126 U/L 69 70 77  AST 15 - 41 U/L 29 32 27  ALT 0 - 44 U/L 19 18 18       RADIOGRAPHIC STUDIES: I have personally reviewed the radiological images as listed and agreed with the findings in the report. No results found.    Orders Placed This Encounter  Procedures   ECHOCARDIOGRAM COMPLETE    Standing Status:   Future    Standing Expiration Date:   03/17/2022    Order Specific Question:   Where should this test be performed    Answer:   [facility]    Order Specific Question:   Perflutren DEFINITY (image enhancing agent) should be administered unless hypersensitivity or allergy exist    Answer:   Administer Perflutren    Order Specific Question:   Is a special reader required? (athlete or structural heart)    Answer:   No    Order Specific Question:   Does this study need to be read by the Structural team/Level 3 readers?    Answer:   No    Order Specific Question:   Reason for exam-Echo    Answer:   Chemo  Z09   All questions were answered. The patient knows to call the clinic with any problems, questions or concerns. No barriers to learning was detected. The total time spent in the appointment was 30 minutes.     Truitt Merle, MD 03/17/2021   I, Wilburn Mylar, am acting as scribe for Truitt Merle, MD.   I have reviewed the above documentation for accuracy and completeness, and I agree with the above.

## 2021-03-17 NOTE — Patient Instructions (Signed)
[facility]  Discharge Instructions: Thank you for choosing [facility] to provide your oncology and hematology care.   If you have a lab appointment with the [facility], please go directly to the [facility] and check in at the registration area.   Wear comfortable clothing and clothing appropriate for easy access to any Portacath or PICC line.   We strive to give you quality time with your provider. You may need to reschedule your appointment if you arrive late (15 or more minutes).  Arriving late affects you and other patients whose appointments are after yours.  Also, if you miss three or more appointments without notifying the office, you may be dismissed from the clinic at the providers discretion.      For prescription refill requests, have your pharmacy contact our office and allow 72 hours for refills to be completed.    Today you received the following chemotherapy and/or immunotherapy agents Kadcyla      To help prevent nausea and vomiting after your treatment, we encourage you to take your nausea medication as directed.  BELOW ARE SYMPTOMS THAT SHOULD BE REPORTED IMMEDIATELY: *FEVER GREATER THAN 100.4 F (38 C) OR HIGHER *CHILLS OR SWEATING *NAUSEA AND VOMITING THAT IS NOT CONTROLLED WITH YOUR NAUSEA MEDICATION *UNUSUAL SHORTNESS OF BREATH *UNUSUAL BRUISING OR BLEEDING *URINARY PROBLEMS (pain or burning when urinating, or frequent urination) *BOWEL PROBLEMS (unusual diarrhea, constipation, pain near the anus) TENDERNESS IN MOUTH AND THROAT WITH OR WITHOUT PRESENCE OF ULCERS (sore throat, sores in mouth, or a toothache) UNUSUAL RASH, SWELLING OR PAIN  UNUSUAL VAGINAL DISCHARGE OR ITCHING   Items with * indicate a potential emergency and should be followed up as soon as possible or go to the Emergency Department if any problems should occur.  Please show the CHEMOTHERAPY ALERT CARD or IMMUNOTHERAPY ALERT CARD at check-in to the  Emergency Department and triage nurse.  Should you have questions after your visit or need to cancel or reschedule your appointment, please contact [facility]  Dept: [phone]  and follow the prompts.  Office hours are 8:00 a.m. to 4:30 p.m. Monday - Friday. Please note that voicemails left after 4:00 p.m. may not be returned until the following business day.  We are closed weekends and major holidays. You have access to a nurse at all times for urgent questions. Please call the main number to the clinic Dept: [phone] and follow the prompts.   For any non-urgent questions, you may also contact your provider using MyChart. We now offer e-Visits for anyone 21 and older to request care online for non-urgent symptoms. For details visit [email or web address].   Also download the MyChart app! Go to the app store, search "MyChart", open the app, select [facility], and log in with your MyChart username and password.  Due to Covid, a mask is required upon entering the hospital/clinic. If you do not have a mask, one will be given to you upon arrival. For doctor visits, patients may have 1 support person aged 78 or older with them. For treatment visits, patients cannot have anyone with them due to current Covid guidelines and our immunocompromised population.

## 2021-03-17 NOTE — Progress Notes (Signed)
Labs reviewed today by Dr. Burr Medico. Ok to treat with Creatinine of 1.64

## 2021-03-18 LAB — CANCER ANTIGEN 27.29: CA 27.29: 39.4 U/mL — ABNORMAL HIGH (ref 0.0–38.6)

## 2021-03-19 ENCOUNTER — Telehealth: Payer: Self-pay

## 2021-03-19 ENCOUNTER — Other Ambulatory Visit: Payer: Self-pay

## 2021-03-19 NOTE — Telephone Encounter (Signed)
Scheduled pt for ECHO on 04/28/2021 at [facility] @0900  per staff message from Dr. Burr Medico.  Called pt to confirm appt date and time.

## 2021-03-23 ENCOUNTER — Telehealth: Payer: Self-pay | Admitting: Hematology

## 2021-03-23 NOTE — Telephone Encounter (Signed)
Left message with follow-up appointments per 1/11 los.

## 2021-04-07 ENCOUNTER — Other Ambulatory Visit: Payer: Self-pay

## 2021-04-07 ENCOUNTER — Inpatient Hospital Stay: Payer: Medicare Other

## 2021-04-07 ENCOUNTER — Inpatient Hospital Stay: Payer: Medicare Other | Attending: Nurse Practitioner

## 2021-04-07 ENCOUNTER — Ambulatory Visit (HOSPITAL_COMMUNITY)
Admission: RE | Admit: 2021-04-07 | Discharge: 2021-04-07 | Disposition: A | Discharge status: Home / Self Care | Payer: Medicare Other | Source: Ambulatory Visit | Attending: Hematology | Admitting: Hematology

## 2021-04-07 ENCOUNTER — Other Ambulatory Visit: Payer: Self-pay | Admitting: Hematology

## 2021-04-07 VITALS — BP 142/59 | HR 75 | Temp 98.0°F | Resp 16 | Wt 168.8 lb

## 2021-04-07 DIAGNOSIS — C50412 Malignant neoplasm of upper-outer quadrant of left female breast: Secondary | ICD-10-CM

## 2021-04-07 DIAGNOSIS — Z171 Estrogen receptor negative status [ER-]: Secondary | ICD-10-CM | POA: Insufficient documentation

## 2021-04-07 DIAGNOSIS — Z95828 Presence of other vascular implants and grafts: Secondary | ICD-10-CM

## 2021-04-07 DIAGNOSIS — Z5112 Encounter for antineoplastic immunotherapy: Secondary | ICD-10-CM | POA: Insufficient documentation

## 2021-04-07 LAB — CMP (CANCER CENTER ONLY)
ALT: 16 U/L (ref 0–44)
AST: 26 U/L (ref 15–41)
Albumin: 3.8 g/dL (ref 3.5–5.0)
Alkaline Phosphatase: 77 U/L (ref 38–126)
Anion gap: 6 (ref 5–15)
BUN: 24 mg/dL — ABNORMAL HIGH (ref 8–23)
CO2: 27 mmol/L (ref 22–32)
Calcium: 10.1 mg/dL (ref 8.9–10.3)
Chloride: 110 mmol/L (ref 98–111)
Creatinine: 1.63 mg/dL — ABNORMAL HIGH (ref 0.44–1.00)
GFR, Estimated: 32 mL/min — ABNORMAL LOW (ref >60)
Glucose, Bld: 107 mg/dL — ABNORMAL HIGH (ref 70–99)
Potassium: 3.9 mmol/L (ref 3.5–5.1)
Sodium: 143 mmol/L (ref 135–145)
Total Bilirubin: 0.4 mg/dL (ref 0.3–1.2)
Total Protein: 7.2 g/dL (ref 6.5–8.1)

## 2021-04-07 LAB — CBC WITH DIFFERENTIAL (CANCER CENTER ONLY)
Abs Immature Granulocytes: 0.01 10*3/uL (ref 0.00–0.07)
Basophils Absolute: 0 10*3/uL (ref 0.0–0.1)
Basophils Relative: 0 %
Eosinophils Absolute: 0.2 10*3/uL (ref 0.0–0.5)
Eosinophils Relative: 4 %
HCT: 29.9 % — ABNORMAL LOW (ref 36.0–46.0)
Hemoglobin: 9.8 g/dL — ABNORMAL LOW (ref 12.0–15.0)
Immature Granulocytes: 0 %
Lymphocytes Relative: 33 %
Lymphs Abs: 2.1 10*3/uL (ref 0.7–4.0)
MCH: 23.6 pg — ABNORMAL LOW (ref 26.0–34.0)
MCHC: 32.8 g/dL (ref 30.0–36.0)
MCV: 72 fL — ABNORMAL LOW (ref 80.0–100.0)
Monocytes Absolute: 0.4 10*3/uL (ref 0.1–1.0)
Monocytes Relative: 6 %
Neutro Abs: 3.5 10*3/uL (ref 1.7–7.7)
Neutrophils Relative %: 57 %
Platelet Count: 240 10*3/uL (ref 150–400)
RBC: 4.15 MIL/uL (ref 3.87–5.11)
RDW: 16.3 % — ABNORMAL HIGH (ref 11.5–15.5)
WBC Count: 6.2 10*3/uL (ref 4.0–10.5)
nRBC: 0 % (ref 0.0–0.2)

## 2021-04-07 IMAGING — DX DG HAND COMPLETE 3+V*R*
3 series · 3 of 3 positions shown · non-contrast
Comparison: None.

CLINICAL DATA: Fall 2 weeks ago. Fifth finger pain. Unable to flex.
Evaluate for acute fracture.

EXAM:
RIGHT HAND - COMPLETE 3+ VIEW

[hand pa]
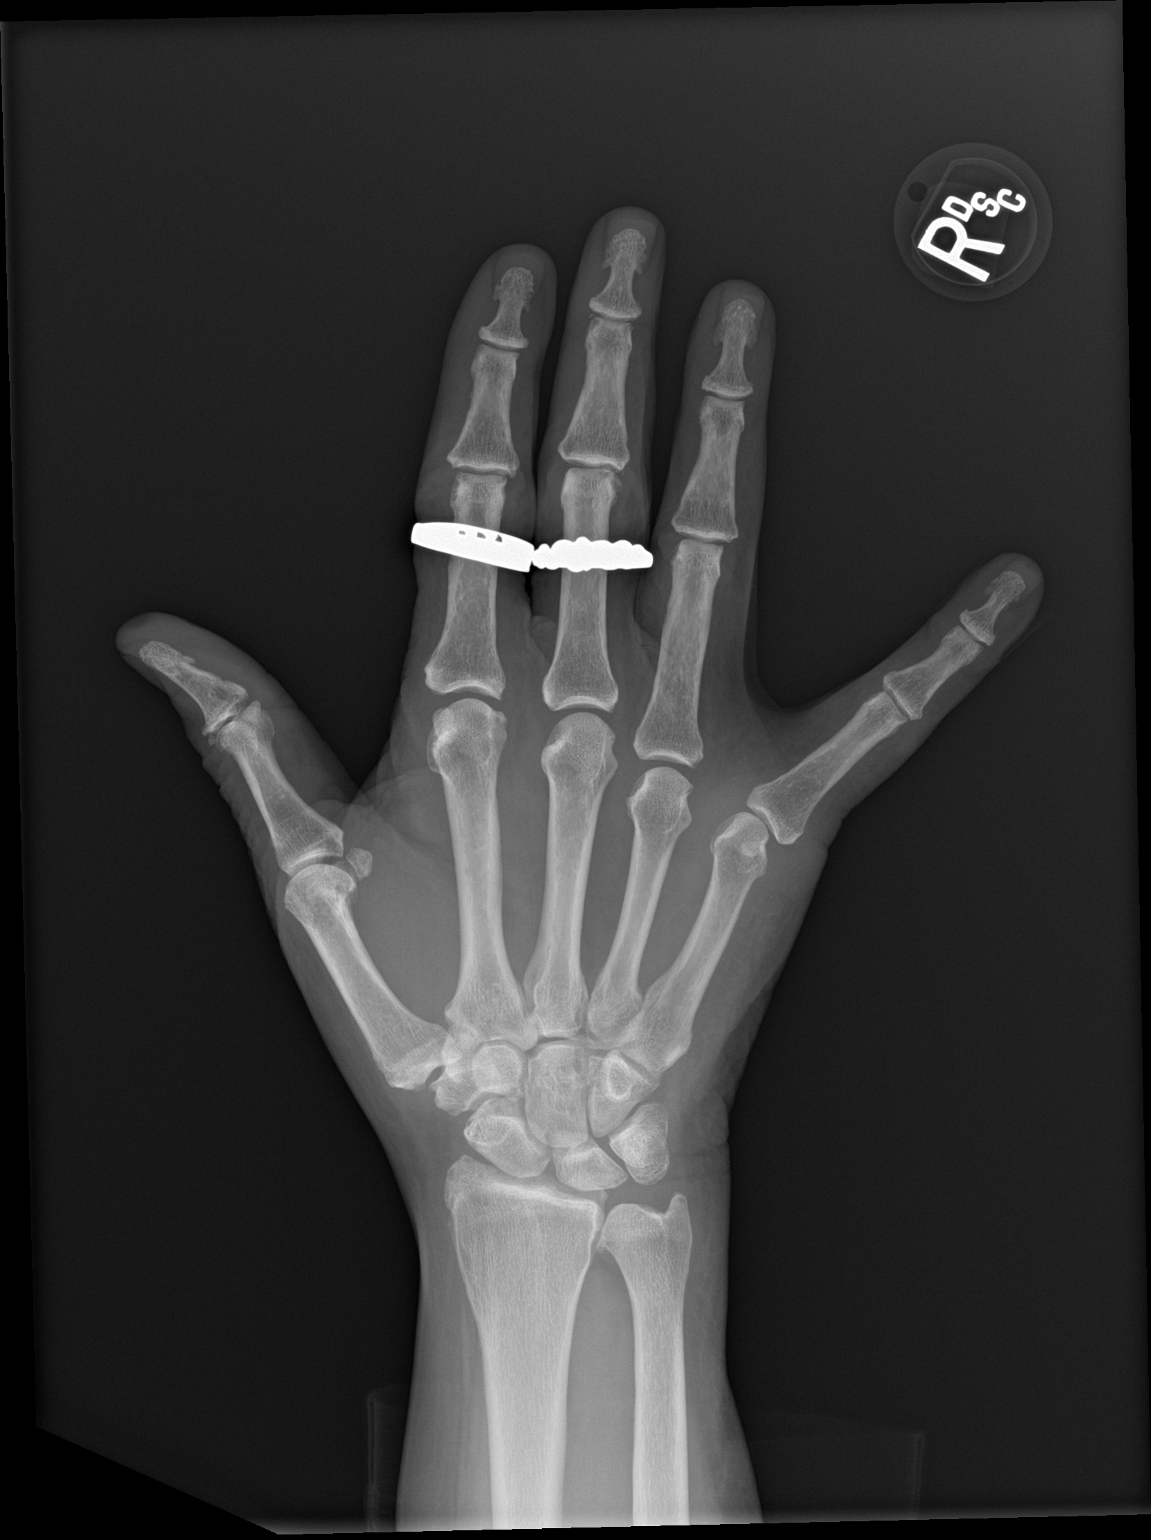

[hand obl]
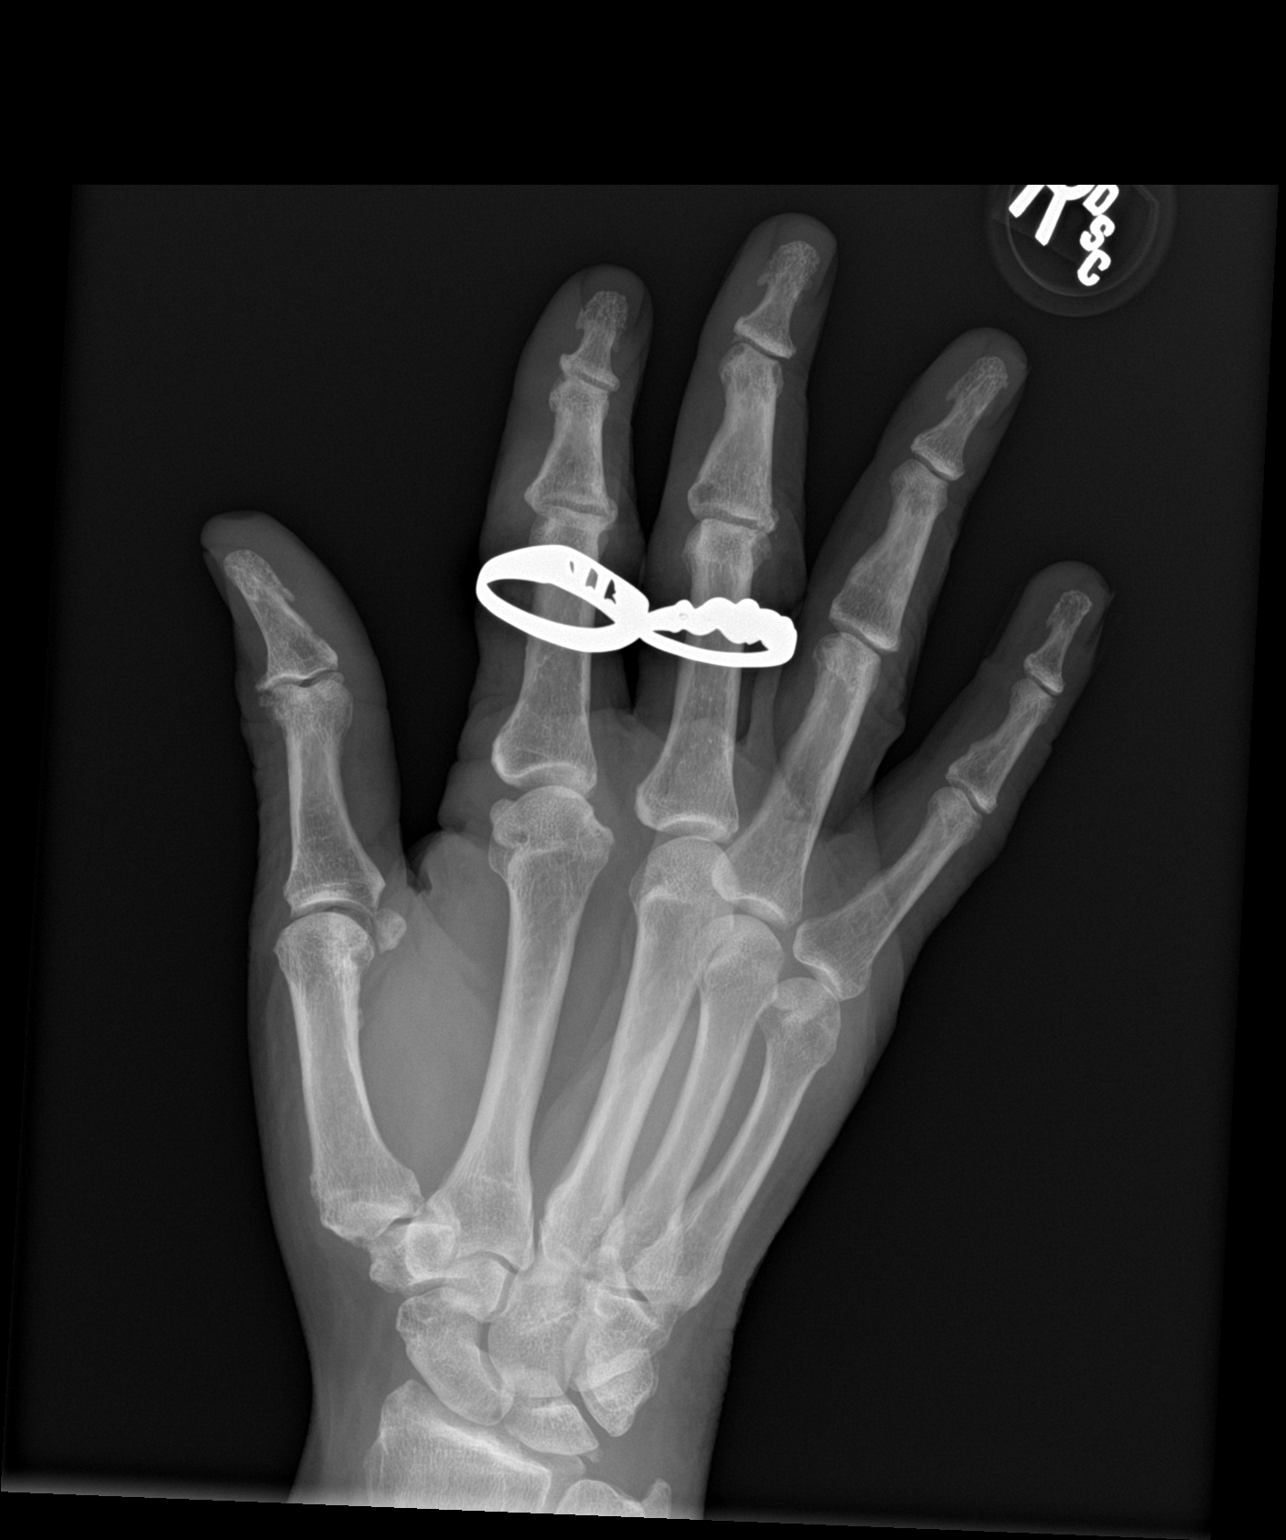

[hand lat]
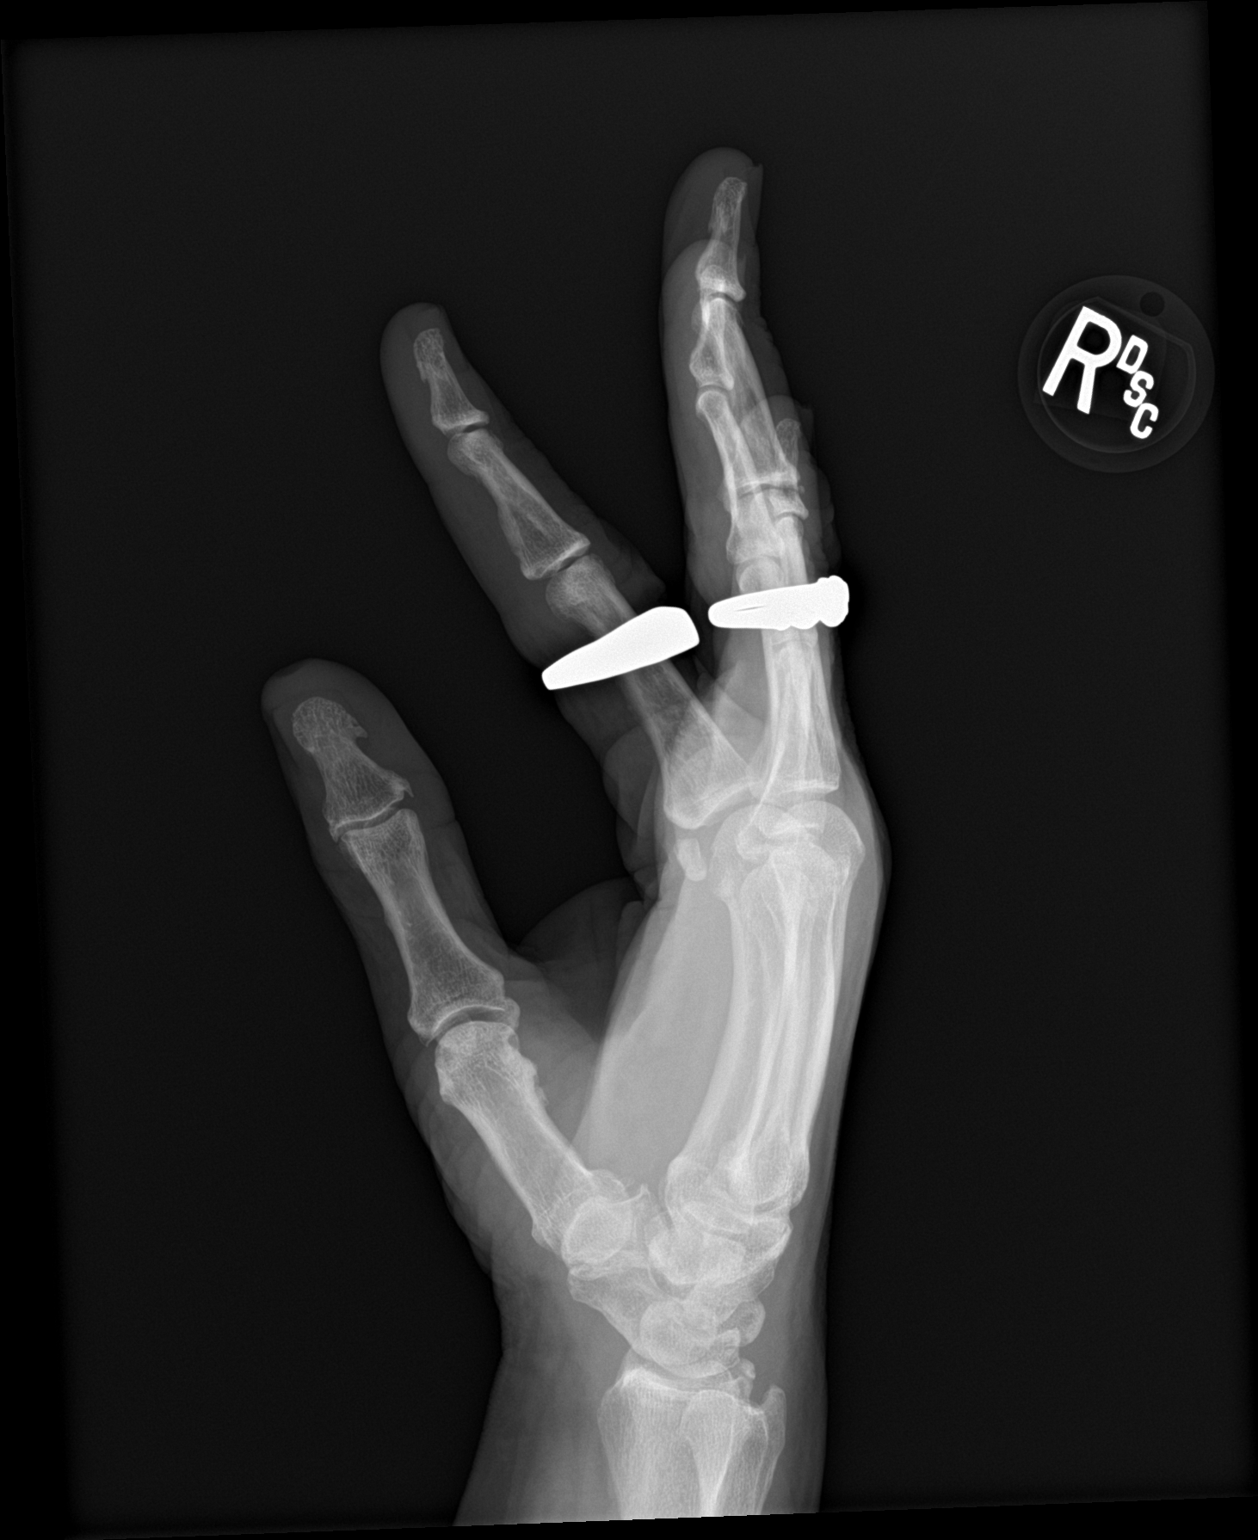

[3 of 3 positions shown; findings below may reference images not displayed]

FINDINGS: The fifth finger joint spaces are preserved. No acute fracture is
seen. No dislocation.

Moderate thumb interphalangeal joint space narrowing and peripheral
osteophytosis. Mild thumb metacarpophalangeal joint osteoarthritis.
Moderate thumb carpometacarpal joint space narrowing, subchondral
sclerosis, and peripheral osteophytosis. Mild triscaphe joint space
narrowing and subchondral degenerative cystic change.

Mild-to-moderate degenerative changes of the second and third and
mild degenerative changes of the fourth interphalangeal joints.

Mild distal radioulnar joint osteoarthritis.
IMPRESSION: :
IMPRESSION: 1. No acute fracture is seen, with attention to the fifth finger.
2. Osteoarthritis as above, greatest at the thumb carpometacarpal
joint and thumb interphalangeal joint.

## 2021-04-07 MED ORDER — HEPARIN SOD (PORK) LOCK FLUSH 100 UNIT/ML IV SOLN
500.0000 [IU] | Freq: Once | INTRAVENOUS | Status: AC | PRN
  Administered 2021-04-07: 500 [IU]

## 2021-04-07 MED ORDER — SODIUM CHLORIDE 0.9% FLUSH
10.0000 mL | INTRAVENOUS | Status: DC | PRN
  Administered 2021-04-07: 10 mL

## 2021-04-07 MED ORDER — ACETAMINOPHEN 325 MG PO TABS
650.0000 mg | ORAL_TABLET | Freq: Once | ORAL | Status: AC
  Administered 2021-04-07: 650 mg via ORAL
  Filled 2021-04-07: qty 2

## 2021-04-07 MED ORDER — SODIUM CHLORIDE 0.9 % IV SOLN
3.0000 mg/kg | Freq: Once | INTRAVENOUS | Status: AC
  Administered 2021-04-07: 240 mg via INTRAVENOUS
  Filled 2021-04-07: qty 8

## 2021-04-07 MED ORDER — SODIUM CHLORIDE 0.9 % IV SOLN: Freq: Once | INTRAVENOUS | Status: AC

## 2021-04-07 MED ORDER — SODIUM CHLORIDE 0.9% FLUSH
10.0000 mL | Freq: Once | INTRAVENOUS | Status: AC
  Administered 2021-04-07: 10 mL

## 2021-04-07 MED ORDER — DIPHENHYDRAMINE HCL 25 MG PO CAPS
50.0000 mg | ORAL_CAPSULE | Freq: Once | ORAL | Status: AC
  Administered 2021-04-07: 50 mg via ORAL
  Filled 2021-04-07: qty 2

## 2021-04-07 NOTE — Progress Notes (Signed)
Pt informed RN that she fell in her closet about 2 weeks ago. Since this fall the pt has had pain in her pinkie & right hand. Pt has not seen anyone for this.  RN made MD aware.  MD has ordered an xray

## 2021-04-07 NOTE — Patient Instructions (Addendum)
[facility]  Discharge Instructions: Thank you for choosing [facility] to provide your oncology and hematology care.   If you have a lab appointment with the [facility], please go directly to the [facility] and check in at the registration area.   Wear comfortable clothing and clothing appropriate for easy access to any Portacath or PICC line.   We strive to give you quality time with your provider. You may need to reschedule your appointment if you arrive late (15 or more minutes).  Arriving late affects you and other patients whose appointments are after yours.  Also, if you miss three or more appointments without notifying the office, you may be dismissed from the clinic at the providers discretion.      For prescription refill requests, have your pharmacy contact our office and allow 72 hours for refills to be completed.    Today you received the following chemotherapy and/or immunotherapy agents: Kadcyla    To help prevent nausea and vomiting after your treatment, we encourage you to take your nausea medication as directed.  BELOW ARE SYMPTOMS THAT SHOULD BE REPORTED IMMEDIATELY: *FEVER GREATER THAN 100.4 F (38 C) OR HIGHER *CHILLS OR SWEATING *NAUSEA AND VOMITING THAT IS NOT CONTROLLED WITH YOUR NAUSEA MEDICATION *UNUSUAL SHORTNESS OF BREATH *UNUSUAL BRUISING OR BLEEDING *URINARY PROBLEMS (pain or burning when urinating, or frequent urination) *BOWEL PROBLEMS (unusual diarrhea, constipation, pain near the anus) TENDERNESS IN MOUTH AND THROAT WITH OR WITHOUT PRESENCE OF ULCERS (sore throat, sores in mouth, or a toothache) UNUSUAL RASH, SWELLING OR PAIN  UNUSUAL VAGINAL DISCHARGE OR ITCHING   Items with * indicate a potential emergency and should be followed up as soon as possible or go to the Emergency Department if any problems should occur.  Please show the CHEMOTHERAPY ALERT CARD or IMMUNOTHERAPY ALERT CARD at check-in to the  Emergency Department and triage nurse.  Should you have questions after your visit or need to cancel or reschedule your appointment, please contact [facility]  Dept: [phone]  and follow the prompts.  Office hours are 8:00 a.m. to 4:30 p.m. Monday - Friday. Please note that voicemails left after 4:00 p.m. may not be returned until the following business day.  We are closed weekends and major holidays. You have access to a nurse at all times for urgent questions. Please call the main number to the clinic Dept: [phone] and follow the prompts.   For any non-urgent questions, you may also contact your provider using MyChart. We now offer e-Visits for anyone 69 and older to request care online for non-urgent symptoms. For details visit [email or web address].   Also download the MyChart app! Go to the app store, search "MyChart", open the app, select [facility], and log in with your MyChart username and password.  Due to Covid, a mask is required upon entering the hospital/clinic. If you do not have a mask, one will be given to you upon arrival. For doctor visits, patients may have 1 support person aged 60 or older with them. For treatment visits, patients cannot have anyone with them due to current Covid guidelines and our immunocompromised population.

## 2021-04-07 NOTE — Progress Notes (Signed)
Per Dr. Burr Medico, [place] to trt w/ [facility] 1.63 and ECHO from 12/24/2021 EF 55-60%

## 2021-04-08 LAB — CANCER ANTIGEN 27.29: CA 27.29: 29.9 U/mL (ref 0.0–38.6)

## 2021-04-21 ENCOUNTER — Other Ambulatory Visit: Payer: Self-pay

## 2021-04-28 ENCOUNTER — Ambulatory Visit (HOSPITAL_COMMUNITY)
Admission: RE | Admit: 2021-04-28 | Discharge: 2021-04-28 | Disposition: A | Discharge status: Home / Self Care | Payer: Medicare Other | Source: Ambulatory Visit | Attending: Hematology | Admitting: Hematology

## 2021-04-28 ENCOUNTER — Other Ambulatory Visit: Payer: Self-pay

## 2021-04-28 DIAGNOSIS — C50412 Malignant neoplasm of upper-outer quadrant of left female breast: Secondary | ICD-10-CM

## 2021-04-28 DIAGNOSIS — Z171 Estrogen receptor negative status [ER-]: Secondary | ICD-10-CM

## 2021-04-28 DIAGNOSIS — Z0189 Encounter for other specified special examinations: Secondary | ICD-10-CM

## 2021-04-28 DIAGNOSIS — Z01818 Encounter for other preprocedural examination: Secondary | ICD-10-CM | POA: Diagnosis not present

## 2021-04-28 DIAGNOSIS — I517 Cardiomegaly: Secondary | ICD-10-CM | POA: Insufficient documentation

## 2021-04-28 LAB — ECHOCARDIOGRAM COMPLETE
AR max vel: 2.74 cm2
AV Peak grad: 6.4 mmHg
Ao pk vel: 1.26 m/s
Area-P 1/2: 2.86 cm2
S' Lateral: 2.1 cm
Single Plane A4C EF: 56 %

## 2021-04-28 NOTE — Progress Notes (Signed)
Echocardiogram 2D Echocardiogram has been performed.  Jefferey Pica 04/28/2021, 10:01 AM

## 2021-04-29 ENCOUNTER — Inpatient Hospital Stay (HOSPITAL_BASED_OUTPATIENT_CLINIC_OR_DEPARTMENT_OTHER): Payer: Medicare Other | Admitting: Hematology

## 2021-04-29 ENCOUNTER — Encounter: Payer: Self-pay | Admitting: Hematology

## 2021-04-29 ENCOUNTER — Inpatient Hospital Stay: Payer: Medicare Other

## 2021-04-29 VITALS — BP 128/56 | HR 80 | Temp 98.4°F | Resp 18 | Ht 64.0 in | Wt 164.8 lb

## 2021-04-29 DIAGNOSIS — C50412 Malignant neoplasm of upper-outer quadrant of left female breast: Secondary | ICD-10-CM | POA: Diagnosis not present

## 2021-04-29 DIAGNOSIS — Z171 Estrogen receptor negative status [ER-]: Secondary | ICD-10-CM

## 2021-04-29 DIAGNOSIS — Z95828 Presence of other vascular implants and grafts: Secondary | ICD-10-CM

## 2021-04-29 LAB — CMP (CANCER CENTER ONLY)
ALT: 15 U/L (ref 0–44)
AST: 24 U/L (ref 15–41)
Albumin: 3.9 g/dL (ref 3.5–5.0)
Alkaline Phosphatase: 73 U/L (ref 38–126)
Anion gap: 5 (ref 5–15)
BUN: 27 mg/dL — ABNORMAL HIGH (ref 8–23)
CO2: 28 mmol/L (ref 22–32)
Calcium: 10.5 mg/dL — ABNORMAL HIGH (ref 8.9–10.3)
Chloride: 109 mmol/L (ref 98–111)
Creatinine: 1.69 mg/dL — ABNORMAL HIGH (ref 0.44–1.00)
GFR, Estimated: 30 mL/min — ABNORMAL LOW (ref >60)
Glucose, Bld: 99 mg/dL (ref 70–99)
Potassium: 4.1 mmol/L (ref 3.5–5.1)
Sodium: 142 mmol/L (ref 135–145)
Total Bilirubin: 0.4 mg/dL (ref 0.3–1.2)
Total Protein: 7.2 g/dL (ref 6.5–8.1)

## 2021-04-29 LAB — CBC WITH DIFFERENTIAL (CANCER CENTER ONLY)
Abs Immature Granulocytes: 0.02 10*3/uL (ref 0.00–0.07)
Basophils Absolute: 0 10*3/uL (ref 0.0–0.1)
Basophils Relative: 1 %
Eosinophils Absolute: 0.2 10*3/uL (ref 0.0–0.5)
Eosinophils Relative: 4 %
HCT: 29.6 % — ABNORMAL LOW (ref 36.0–46.0)
Hemoglobin: 10 g/dL — ABNORMAL LOW (ref 12.0–15.0)
Immature Granulocytes: 0 %
Lymphocytes Relative: 33 %
Lymphs Abs: 2 10*3/uL (ref 0.7–4.0)
MCH: 23.9 pg — ABNORMAL LOW (ref 26.0–34.0)
MCHC: 33.8 g/dL (ref 30.0–36.0)
MCV: 70.6 fL — ABNORMAL LOW (ref 80.0–100.0)
Monocytes Absolute: 0.4 10*3/uL (ref 0.1–1.0)
Monocytes Relative: 7 %
Neutro Abs: 3.3 10*3/uL (ref 1.7–7.7)
Neutrophils Relative %: 55 %
Platelet Count: 237 10*3/uL (ref 150–400)
RBC: 4.19 MIL/uL (ref 3.87–5.11)
RDW: 16.3 % — ABNORMAL HIGH (ref 11.5–15.5)
WBC Count: 6 10*3/uL (ref 4.0–10.5)
nRBC: 0 % (ref 0.0–0.2)

## 2021-04-29 MED ORDER — SODIUM CHLORIDE 0.9 % IV SOLN: Freq: Once | INTRAVENOUS | Status: AC

## 2021-04-29 MED ORDER — ACETAMINOPHEN 325 MG PO TABS
650.0000 mg | ORAL_TABLET | Freq: Once | ORAL | Status: AC
  Administered 2021-04-29: 650 mg via ORAL
  Filled 2021-04-29: qty 2

## 2021-04-29 MED ORDER — SODIUM CHLORIDE 0.9% FLUSH
10.0000 mL | Freq: Once | INTRAVENOUS | Status: AC
  Administered 2021-04-29: 10 mL

## 2021-04-29 MED ORDER — SODIUM CHLORIDE 0.9% FLUSH
10.0000 mL | INTRAVENOUS | Status: DC | PRN
  Administered 2021-04-29: 10 mL

## 2021-04-29 MED ORDER — HEPARIN SOD (PORK) LOCK FLUSH 100 UNIT/ML IV SOLN
500.0000 [IU] | Freq: Once | INTRAVENOUS | Status: AC | PRN
  Administered 2021-04-29: 500 [IU]

## 2021-04-29 MED ORDER — DIPHENHYDRAMINE HCL 25 MG PO CAPS
50.0000 mg | ORAL_CAPSULE | Freq: Once | ORAL | Status: AC
  Administered 2021-04-29: 50 mg via ORAL
  Filled 2021-04-29: qty 2

## 2021-04-29 MED ORDER — SODIUM CHLORIDE 0.9 % IV SOLN
3.0000 mg/kg | Freq: Once | INTRAVENOUS | Status: AC
  Administered 2021-04-29: 240 mg via INTRAVENOUS
  Filled 2021-04-29: qty 8

## 2021-04-29 NOTE — Progress Notes (Signed)
Ok to treat today with Scr of 1.69mg /dl per Dr Burr Medico.

## 2021-04-29 NOTE — Patient Instructions (Signed)
[facility]   Discharge Instructions: Thank you for choosing [facility] to provide your oncology and hematology care.   If you have a lab appointment with the [facility], please go directly to the [facility] and check in at the registration area.   Wear comfortable clothing and clothing appropriate for easy access to any Portacath or PICC line.   We strive to give you quality time with your provider. You may need to reschedule your appointment if you arrive late (15 or more minutes).  Arriving late affects you and other patients whose appointments are after yours.  Also, if you miss three or more appointments without notifying the office, you may be dismissed from the clinic at the providers discretion.      For prescription refill requests, have your pharmacy contact our office and allow 72 hours for refills to be completed.    Today you received the following chemotherapy and/or immunotherapy agents: ado-trastuzumab emtansine      To help prevent nausea and vomiting after your treatment, we encourage you to take your nausea medication as directed.  BELOW ARE SYMPTOMS THAT SHOULD BE REPORTED IMMEDIATELY: *FEVER GREATER THAN 100.4 F (38 C) OR HIGHER *CHILLS OR SWEATING *NAUSEA AND VOMITING THAT IS NOT CONTROLLED WITH YOUR NAUSEA MEDICATION *UNUSUAL SHORTNESS OF BREATH *UNUSUAL BRUISING OR BLEEDING *URINARY PROBLEMS (pain or burning when urinating, or frequent urination) *BOWEL PROBLEMS (unusual diarrhea, constipation, pain near the anus) TENDERNESS IN MOUTH AND THROAT WITH OR WITHOUT PRESENCE OF ULCERS (sore throat, sores in mouth, or a toothache) UNUSUAL RASH, SWELLING OR PAIN  UNUSUAL VAGINAL DISCHARGE OR ITCHING   Items with * indicate a potential emergency and should be followed up as soon as possible or go to the Emergency Department if any problems should occur.  Please show the CHEMOTHERAPY ALERT CARD or IMMUNOTHERAPY ALERT  CARD at check-in to the Emergency Department and triage nurse.  Should you have questions after your visit or need to cancel or reschedule your appointment, please contact [facility]  Dept: [phone]  and follow the prompts.  Office hours are 8:00 a.m. to 4:30 p.m. Monday - Friday. Please note that voicemails left after 4:00 p.m. may not be returned until the following business day.  We are closed weekends and major holidays. You have access to a nurse at all times for urgent questions. Please call the main number to the clinic Dept: [phone] and follow the prompts.   For any non-urgent questions, you may also contact your provider using MyChart. We now offer e-Visits for anyone 29 and older to request care online for non-urgent symptoms. For details visit [email or web address].   Also download the MyChart app! Go to the app store, search "MyChart", open the app, select [facility], and log in with your MyChart username and password.  Due to Covid, a mask is required upon entering the hospital/clinic. If you do not have a mask, one will be given to you upon arrival. For doctor visits, patients may have 1 support person aged 9 or older with them. For treatment visits, patients cannot have anyone with them due to current Covid guidelines and our immunocompromised population.

## 2021-04-29 NOTE — Progress Notes (Signed)
[facility]   [phone] Fax:([phone]) [phone]   Clinic Follow up Note   Patient Care Team: Nolene Ebbs, MD as PCP - General (Internal Medicine) Mauro Kaufmann, RN as Oncology Nurse Navigator Rockwell Germany, RN as Oncology Nurse Navigator  Date of Service:  04/29/2021  CHIEF COMPLAINT: f/u of metastatic breast cancer  CURRENT THERAPY:  Kadcyla every 3 weeks, started on 09/11/20  ASSESSMENT & PLAN:  Christy [name] is a 81 y.o. female with   1. Malignant neoplasm of overlapping sites in the central and upper out quadrant left breast, invasive ductal carcinoma, cT2N3M1 stage IV, grade 2-3, ER/PR negative HER2 positive, MIB-1 of 20% - with nodal and pulmonary metastases -She initially self palpated a left breast mass in 06/2020, possibly earlier. Work-up showed 2 masses in the left upper outer breast and 3 abnormal lymph nodes. Biopsy of the 2 masses and 1 lymph node all showed invasive ductal carcinoma, grade 2-3, ER/PR negative and HER2 positive. -PET scan 08/28/20 shows the hypermetabolic known left breast malignancy with hypermetabolic left axillary, retropectoral, and supraclavicular adenopathy.  Additionally there are several small 3-6 mm pulmonary nodules with mild hypermetabolic activity, felt to be consistent with pulmonary metastasis.  -baseline labs which shows CKD and mild anemia, CA 27.29 is normal. Baseline echo EF 55-60% -She was started on first-line systemic Kadcyla, given once every 3 weeks, on 09/11/20. She tolerates well with some fatigue, dose decreased with C4 -repeat echo 04/28/21 was stable. -PET on 03/04/21 showed marked improvement, no new lesions. Will continue Kadcyla. Plan for repeat CT scan in 6 weeks. -Labs reviewed, overall adequate for treatment. We will proceed today but hold her next treatment to give her a break due to her fatigue and anorexia.   2. Symptom management: Constipation, difficulty urinating, diminished  appetite/weight loss, fatigue -chronic, but worsening -she does not eat enough. Given her DM, I recommend she use Glucerna for nutritional supplement. -she is tired today and reports low appetite. We will hold her next treatment to give her a break.   3. Genetics -Her sister had metastatic breast cancer in her 38s, and that sister's daughter possibly have breast cancer. -Due to family history and ER/PR negative disease, patient qualifies for genetics. She has not been evaluated or tested yet. -Patient's daughter understands her breast cancer risk and the importance of annual mammography.   4. Spinal stenosis, H/o Fall -Reportedly diagnosed in [place], [place].  Patient has chronic back pain and left leg weakness at baseline -She now also has right leg weakness. She is ambulating with a walker. -PET is negative for spinal mets and brain MRI shows subacute lacunar infarct without metastatic disease  -she had a fall and hit her head in 01/2021. Work up was negative.   5. HTN, DM -on metformin, amlodipine, and olmesartan -f/up PCP Dr. Jeanie Cooks -Denies baseline neuropathy   6. Subacute stroke -Her brain MRI showed subacute lacunar infarct of the left basal ganglia, she has no clinical symptoms of stroke. -She was seen by Dr. Mickeal Skinner on 09/17/20. He recommended cholesterol medicine and full-dose aspirin. She was prescribed lipitor at that time.     PLAN: -proceed with [place] with same dose reduction to 64m/kg -we will cancel treatment on 05/19/21 to give her a chemo break. -f/u and Kadcyla in 6 weeks, with labs and restaging scan several days before -I called her daughter during her visit    No problem-specific Assessment & Plan notes found for this encounter.  SUMMARY OF ONCOLOGIC HISTORY: Oncology History Overview Note  Cancer Staging Malignant neoplasm of upper-outer quadrant of female breast ([place]) Staging form: Breast, AJCC 8th Edition - Clinical stage from 07/31/2020: Stage IV  (cT2, cN3, cM1, G2, ER-, PR-, HER2+) - Signed by Alla Feeling, NP on 08/31/2020 Stage prefix: Initial diagnosis Nuclear grade: G2 Histologic grading system: 3 grade system    Malignant neoplasm of upper-outer quadrant of female breast ([facility])  07/24/2020 Breast US   On physical exam, a very firm mass is identified at the 1:30 position of the LEFT breast 5 cm from the nipple.   IMPRESSION: 1. Highly suspicious 4.3 cm mass at the 1:30 position of the LEFT breast and highly suspicious 2.7 cm mass at the 2 o'clock position of the LEFT breast, with the 2 masses encompassing an area measuring at least 6.1 cm. 2. 3 abnormal LEFT axillary lymph nodes with cortical thickening, tissue sampling of 1 of these lymph nodes recommended. 3. Anterior LEFT breast skin thickening nonspecific but may reflect dermal involvement. 4. No mammographic evidence of RIGHT breast malignancy.   07/31/2020 Initial Biopsy   Diagnosis 1. Breast, left, needle core biopsy, 1 :30 pm, 5cmfn - INVASIVE DUCTAL CARCINOMA, SEE COMMENT. 2. Breast, left, needle core biopsy, 2 o'clock, 8cmfn - INVASIVE DUCTAL CARCINOMA, SEE COMMENT. - DUCTAL CARCINOMA IN SITU. 3. Lymph node, needle/core biopsy, left axillary - METASTATIC CARCINOMA IN A LYMPH NODE. Microscopic Comment 1. and 3. The carcinoma in both parts has a similar morphology and appears grade 2-3. The DCIS in part 2 is high-grade with necrosis. The carcinoma measures 15 mm (part 1) and 14 mm (part 2) in greatest linear extent  2. PROGNOSTIC INDICATORS Results: IMMUNOHISTOCHEMICAL AND MORPHOMETRIC ANALYSIS PERFORMED MANUALLY The tumor cells are POSITIVE for Her2 (3+). Estrogen Receptor: 0%, NEGATIVE Progesterone Receptor: 0%, NEGATIVE Proliferation Marker Ki67: 20%    07/31/2020 Cancer Staging   Staging form: Breast, AJCC 8th Edition - Clinical stage from 07/31/2020: Stage IV (cT2, cN3, cM1, G2, ER-, PR-, HER2+) - Signed by Alla Feeling, NP on 08/31/2020 Stage  prefix: Initial diagnosis Nuclear grade: G2 Histologic grading system: 3 grade system    08/13/2020 Initial Diagnosis   Malignant neoplasm of upper-outer quadrant of female breast ([facility])   08/28/2020 PET scan   IMPRESSION: Signs of LEFT breast cancer with LEFT axillary and retropectoral adenopathy as well as LEFT supraclavicular adenopathy.   Signs of pulmonary metastatic disease.   Mild asymmetric uptake in the LEFT as compared to the RIGHT adrenal gland. Equivocal but suspicious, attention on follow-up.   Aortic atherosclerosis.   Cystic changes and potential bronchiectasis in the RIGHT lung base likely post infectious or inflammatory. Correlate with any respiratory symptoms. Comparison with prior imaging may be helpful with attention on follow-up.   08/28/2020 Imaging   BRAIN MRI IMPRESSION: 1. Subacute lacunar infarct of the left basal ganglia with no malignant hemorrhagic transformation or mass effect. Underlying chronic bilateral basal ganglia ischemia. 2. No metastatic disease or other acute intracranial abnormality identified.   08/28/2020 Imaging   Breast MRI   IMPRESSION: 1. 8.1 x 8.1 x 5.1 cm area of biopsy-proven malignancy in the central, upper outer and lower outer quadrants of the left breast. 2. Diffuse anterior left breast skin thickening without abnormal enhancement. This may represent thickening due to lymphedema associated with lymphatic obstruction. 3. Metastatic level 1 and level 2 left axillary adenopathy. 4. No evidence of malignancy on the right.   09/11/2020 -  Chemotherapy  Patient is on Treatment Plan : BREAST ADO-Trastuzumab Emtansine (Kadcyla) q21d     11/30/2020 Imaging   CT Chest w/o contrast  IMPRESSION: 1. Interval response to therapy. The left breast mass has mildly decreased in size in the interval. 2. Decrease in size of left axillary and left retropectoral lymph nodes. 3. Small right lung nodules stable to improved. 4. Coronary  artery calcifications noted. 5. Aortic Atherosclerosis (ICD10-I70.0).   03/04/2021 PET scan   IMPRESSION: 1. Overall marked improvement compared to the prior PET-CT, with resolution of hypermetabolic activity and pathologic enlargement of the previously involved lymph nodes; lack of hypermetabolic activity and nonvisualization of prior right-sided lung nodules, and substantial reduction in size and activity in the left breast primary. 2. New ground-glass opacity medially in the right lower lobe with low-grade metabolic activity, highly likely to be inflammatory (alveolitis) given the morphology and appearance. 3. Other imaging findings of potential clinical significance: Aortic Atherosclerosis (ICD10-I70.0). Coronary atherosclerosis. Mitral valve calcifications. Punctate nonobstructive left renal calculi. Lower lumbar impingement due to chronic spondylosis and degenerative disc disease. Remote left basal ganglia infarct.      INTERVAL HISTORY:  Christy [name] is here for a follow up of metastatic breast cancer. She was last seen by me on 03/17/21. She presents to the clinic alone; she used our transportation service to get here today. She reports she feels "out of it" today. She notes she experienced pain following the echo yesterday, which has resolved today. However, she is fatigued today and notes her appetite is poor. She notes she lives with her daughter and grandchildren (31 year old twins and a 81 year old grandson). She notes her grandson has spina bifida; he cannot walk on his own and requires care. She reports she still has some pain in her right hand and feels a bump (recall x-ray on 04/07/21 was negative).    All other systems were reviewed with the patient and are negative.  MEDICAL HISTORY:  Past Medical History:  Diagnosis Date   Diabetes mellitus without complication ([place] [facility])    Hyperlipidemia    Hypertension     SURGICAL HISTORY: Past Surgical History:  Procedure  Laterality Date   FOOT FRACTURE SURGERY Right    IR IMAGING GUIDED PORT INSERTION  09/22/2020   IR RADIOLOGIST EVAL & MGMT  09/25/2020    I have reviewed the social history and family history with the patient and they are unchanged from previous note.  ALLERGIES:  has No Known Allergies.  MEDICATIONS:  Current Outpatient Medications  Medication Sig Dispense Refill   amLODipine (NORVASC) 5 MG tablet  (Patient not taking: Reported on 09/17/2020)     aspirin EC 325 MG tablet Take 1 tablet (325 mg total) by mouth daily. 30 tablet 5   atorvastatin (LIPITOR) 20 MG tablet Take 1 tablet (20 mg total) by mouth daily. 30 tablet 5   cetirizine (ZYRTEC ALLERGY) 10 MG tablet Take 1 tablet (10 mg total) by mouth daily. (Patient not taking: Reported on 09/17/2020) 30 tablet 0   fluticasone (FLONASE) 50 MCG/ACT nasal spray Place 1 spray into both nostrils daily. (Patient not taking: Reported on 09/17/2020) 1 g 0   lidocaine-prilocaine (EMLA) cream Apply 1 application topically as needed. 30 g 2   metFORMIN (GLUCOPHAGE) 500 MG tablet Take 1 tablet (500 mg total) by mouth 2 (two) times daily with a meal. 60 tablet 1   olmesartan (BENICAR) 40 MG tablet      No current facility-administered medications for this visit.  PHYSICAL EXAMINATION: ECOG PERFORMANCE STATUS: 2 - Symptomatic, <50% confined to bed  Vitals:   04/29/21 1338  BP: (!) 128/56  Pulse: 80  Resp: 18  Temp: 98.4 F (36.9 C)  SpO2: 100%   Wt Readings from Last 3 Encounters:  04/29/21 164 lb 12.8 oz (74.8 kg)  04/07/21 168 lb 12 oz (76.5 kg)  03/17/21 168 lb 14.4 oz (76.6 kg)     GENERAL:alert, no distress and comfortable SKIN: skin color normal, no rashes or significant lesions EYES: normal, Conjunctiva are pink and non-injected, sclera clear  NEURO: alert & oriented x 3 with fluent speech  LABORATORY DATA:  I have reviewed the data as listed CBC Latest Ref Rng & Units 04/29/2021 04/07/2021 03/17/2021  WBC 4.0 - 10.5 K/uL 6.0 6.2  7.9  Hemoglobin 12.0 - 15.0 g/dL 10.0(L) 9.8(L) 10.4(L)  Hematocrit 36.0 - 46.0 % 29.6(L) 29.9(L) 31.3(L)  Platelets 150 - 400 K/uL 237 240 265     CMP Latest Ref Rng & Units 04/29/2021 04/07/2021 03/17/2021  Glucose 70 - 99 mg/dL 99 107(H) 85  BUN 8 - 23 mg/dL 27(H) 24(H) 24(H)  Creatinine 0.44 - 1.00 mg/dL 1.69(H) 1.63(H) 1.61(H)  Sodium 135 - 145 mmol/L 142 143 141  Potassium 3.5 - 5.1 mmol/L 4.1 3.9 4.1  Chloride 98 - 111 mmol/L 109 110 108  CO2 22 - 32 mmol/L 28 27 25   Calcium 8.9 - 10.3 mg/dL 10.5(H) 10.1 9.9  Total Protein 6.5 - 8.1 g/dL 7.2 7.2 7.2  Total Bilirubin 0.3 - 1.2 mg/dL 0.4 0.4 0.3  Alkaline Phos 38 - 126 U/L 73 77 69  AST 15 - 41 U/L 24 26 29   ALT 0 - 44 U/L 15 16 19       RADIOGRAPHIC STUDIES: I have personally reviewed the radiological images as listed and agreed with the findings in the report. ECHOCARDIOGRAM COMPLETE  Result Date: 04/28/2021    ECHOCARDIOGRAM REPORT   Patient Name:   Christy [name] Date of Exam: 04/28/2021 Medical Rec #:  [id number]         Height:       64.0 in Accession #:    [id number]        Weight:       168.7 lb Date of Birth:  1940-10-16          BSA:          1.820 m Patient Age:    59 years          BP:           142/59 mmHg Patient Gender: F                 HR:           75 bpm. Exam Location:  Outpatient Procedure: 2D Echo, 3D Echo and Strain Analysis Indications:    Chemo  History:        Patient has prior history of Echocardiogram examinations, most                 recent 12/24/2020.  Sonographer:    Jefferey Pica Referring Phys: [id number] [facility]  1. Left ventricular ejection fraction by 3D volume is 65 %. The left ventricle has normal function. The left ventricle has no regional wall motion abnormalities. There is mild left ventricular hypertrophy. Left ventricular diastolic parameters are consistent with Grade I diastolic dysfunction (impaired relaxation). The average left ventricular global longitudinal strain is -23.2 %.  The global longitudinal  strain is normal.  2. Right ventricular systolic function is normal. The right ventricular size is normal. There is normal pulmonary artery systolic pressure.  3. The mitral valve is normal in structure. No evidence of mitral valve regurgitation. No evidence of mitral stenosis.  4. The aortic valve is normal in structure. Aortic valve regurgitation is not visualized. No aortic stenosis is present.  5. The inferior vena cava is normal in size with greater than 50% respiratory variability, suggesting right atrial pressure of 3 mmHg. Comparison(s): No significant change from prior study. Prior images reviewed side by side. FINDINGS  Left Ventricle: Left ventricular ejection fraction by 3D volume is 65 %. The left ventricle has normal function. The left ventricle has no regional wall motion abnormalities. The average left ventricular global longitudinal strain is -23.2 %. The global  longitudinal strain is normal. The left ventricular internal cavity size was normal in size. There is mild left ventricular hypertrophy. Left ventricular diastolic parameters are consistent with Grade I diastolic dysfunction (impaired relaxation). Right Ventricle: The right ventricular size is normal. No increase in right ventricular wall thickness. Right ventricular systolic function is normal. There is normal pulmonary artery systolic pressure. The tricuspid regurgitant velocity is 2.14 m/s, and  with an assumed right atrial pressure of 5 mmHg, the estimated right ventricular systolic pressure is 70.0 mmHg. Left Atrium: Left atrial size was normal in size. Right Atrium: Right atrial size was normal in size. Pericardium: There is no evidence of pericardial effusion. Mitral Valve: The mitral valve is normal in structure. Mild mitral annular calcification. No evidence of mitral valve regurgitation. No evidence of mitral valve stenosis. Tricuspid Valve: The tricuspid valve is normal in structure. Tricuspid valve  regurgitation is trivial. No evidence of tricuspid stenosis. Aortic Valve: The aortic valve is normal in structure. Aortic valve regurgitation is not visualized. No aortic stenosis is present. Aortic valve peak gradient measures 6.4 mmHg. Pulmonic Valve: The pulmonic valve was normal in structure. Pulmonic valve regurgitation is trivial. No evidence of pulmonic stenosis. Aorta: The aortic root is normal in size and structure. Venous: The inferior vena cava is normal in size with greater than 50% respiratory variability, suggesting right atrial pressure of 3 mmHg. IAS/Shunts: No atrial level shunt detected by color flow Doppler.  LEFT VENTRICLE PLAX 2D LVIDd:         3.90 cm         Diastology LVIDs:         2.10 cm         LV e' medial:    5.18 cm/s LV PW:         1.30 cm         LV E/e' medial:  8.8 LV IVS:        1.30 cm         LV e' lateral:   8.33 cm/s LVOT diam:     2.00 cm         LV E/e' lateral: 5.5 LV SV:         64 LV SV Index:   35              2D LVOT Area:     3.14 cm        Longitudinal                                Strain  2D Strain GLS  -23.2 % LV Volumes (MOD)               Avg: LV vol d, MOD    81.8 ml A4C:                           3D Volume EF LV vol s, MOD    36.0 ml       LV 3D EF:    Left A4C:                                        ventricul LV SV MOD A4C:   81.8 ml                    ar                                             ejection                                             fraction                                             by 3D                                             volume is                                             65 %.                                 3D Volume EF:                                3D EF:        65 % RIGHT VENTRICLE            IVC RV Basal diam:  2.40 cm    IVC diam: 1.10 cm RV S prime:     7.80 cm/s TAPSE (M-mode): 2.0 cm LEFT ATRIUM             Index        RIGHT ATRIUM           Index LA diam:        3.70 cm 2.03 cm/m    RA Area:     12.30 cm LA Vol (A2C):   47.7 ml 26.21 ml/m  RA Volume:   27.80 ml  15.27 ml/m LA Vol (A4C):   42.2 ml 23.19 ml/m LA Biplane Vol: 47.3 ml 25.99 ml/m  AORTIC VALVE  PULMONIC VALVE AV Area (Vmax): 2.74 cm     PV Vmax:       0.82 m/s AV Vmax:        126.00 cm/s  PV Peak grad:  2.7 mmHg AV Peak Grad:   6.4 mmHg LVOT Vmax:      110.00 cm/s LVOT Vmean:     65.600 cm/s LVOT VTI:       0.204 m  AORTA Ao Root diam: 3.20 cm Ao Asc diam:  2.70 cm MITRAL VALVE               TRICUSPID VALVE MV Area (PHT): 2.86 cm    TR Peak grad:   18.3 mmHg MV Decel Time: 265 msec    TR Vmax:        214.00 cm/s MV E velocity: 45.40 cm/s MV A velocity: 73.80 cm/s  SHUNTS MV E/A ratio:  0.62        Systemic VTI:  0.20 m                            Systemic Diam: 2.00 cm Candee Furbish MD Electronically signed by Candee Furbish MD Signature Date/Time: 04/28/2021/11:10:19 AM    Final       Orders Placed This Encounter  Procedures   CT CHEST ABDOMEN PELVIS WO CONTRAST    Standing Status:   Future    Standing Expiration Date:   04/29/2022    Order Specific Question:   If indicated for the ordered procedure, I authorize the administration of contrast media per Radiology protocol    Answer:   Yes    Order Specific Question:   Preferred imaging location?    Answer:   [facility]    Order Specific Question:   Is Oral Contrast requested for this exam?    Answer:   Yes, Per Radiology protocol   All questions were answered. The patient knows to call the clinic with any problems, questions or concerns. No barriers to learning was detected. The total time spent in the appointment was 30 minutes.     Truitt Merle, MD 04/29/2021   I, Wilburn Mylar, am acting as scribe for Truitt Merle, MD.   I have reviewed the above documentation for accuracy and completeness, and I agree with the above.

## 2021-04-30 LAB — CANCER ANTIGEN 27.29: CA 27.29: 31.9 U/mL (ref 0.0–38.6)

## 2021-05-19 ENCOUNTER — Other Ambulatory Visit: Payer: Medicare Other

## 2021-05-19 ENCOUNTER — Ambulatory Visit: Payer: Medicare Other

## 2021-06-07 ENCOUNTER — Ambulatory Visit (HOSPITAL_COMMUNITY)
Admission: RE | Admit: 2021-06-07 | Discharge: 2021-06-07 | Disposition: A | Discharge status: Home / Self Care | Payer: Medicare Other | Source: Ambulatory Visit | Attending: Hematology | Admitting: Hematology

## 2021-06-07 ENCOUNTER — Inpatient Hospital Stay: Payer: Medicare Other | Attending: Nurse Practitioner

## 2021-06-07 ENCOUNTER — Other Ambulatory Visit: Payer: Self-pay

## 2021-06-07 DIAGNOSIS — Z8673 Personal history of transient ischemic attack (TIA), and cerebral infarction without residual deficits: Secondary | ICD-10-CM | POA: Insufficient documentation

## 2021-06-07 DIAGNOSIS — I129 Hypertensive chronic kidney disease with stage 1 through stage 4 chronic kidney disease, or unspecified chronic kidney disease: Secondary | ICD-10-CM | POA: Insufficient documentation

## 2021-06-07 DIAGNOSIS — C7800 Secondary malignant neoplasm of unspecified lung: Secondary | ICD-10-CM | POA: Insufficient documentation

## 2021-06-07 DIAGNOSIS — C50412 Malignant neoplasm of upper-outer quadrant of left female breast: Secondary | ICD-10-CM | POA: Insufficient documentation

## 2021-06-07 DIAGNOSIS — I700 Atherosclerosis of aorta: Secondary | ICD-10-CM | POA: Insufficient documentation

## 2021-06-07 DIAGNOSIS — M4726 Other spondylosis with radiculopathy, lumbar region: Secondary | ICD-10-CM | POA: Insufficient documentation

## 2021-06-07 DIAGNOSIS — J439 Emphysema, unspecified: Secondary | ICD-10-CM | POA: Insufficient documentation

## 2021-06-07 DIAGNOSIS — N200 Calculus of kidney: Secondary | ICD-10-CM | POA: Insufficient documentation

## 2021-06-07 DIAGNOSIS — I2510 Atherosclerotic heart disease of native coronary artery without angina pectoris: Secondary | ICD-10-CM | POA: Insufficient documentation

## 2021-06-07 DIAGNOSIS — Z95828 Presence of other vascular implants and grafts: Secondary | ICD-10-CM

## 2021-06-07 DIAGNOSIS — G8929 Other chronic pain: Secondary | ICD-10-CM | POA: Insufficient documentation

## 2021-06-07 DIAGNOSIS — M5116 Intervertebral disc disorders with radiculopathy, lumbar region: Secondary | ICD-10-CM | POA: Insufficient documentation

## 2021-06-07 DIAGNOSIS — Z171 Estrogen receptor negative status [ER-]: Secondary | ICD-10-CM | POA: Insufficient documentation

## 2021-06-07 DIAGNOSIS — M4800 Spinal stenosis, site unspecified: Secondary | ICD-10-CM | POA: Insufficient documentation

## 2021-06-07 DIAGNOSIS — R634 Abnormal weight loss: Secondary | ICD-10-CM | POA: Insufficient documentation

## 2021-06-07 DIAGNOSIS — I6782 Cerebral ischemia: Secondary | ICD-10-CM | POA: Insufficient documentation

## 2021-06-07 DIAGNOSIS — Z803 Family history of malignant neoplasm of breast: Secondary | ICD-10-CM | POA: Insufficient documentation

## 2021-06-07 DIAGNOSIS — I6381 Other cerebral infarction due to occlusion or stenosis of small artery: Secondary | ICD-10-CM | POA: Insufficient documentation

## 2021-06-07 DIAGNOSIS — E1122 Type 2 diabetes mellitus with diabetic chronic kidney disease: Secondary | ICD-10-CM | POA: Insufficient documentation

## 2021-06-07 DIAGNOSIS — Z5112 Encounter for antineoplastic immunotherapy: Secondary | ICD-10-CM | POA: Insufficient documentation

## 2021-06-07 DIAGNOSIS — N189 Chronic kidney disease, unspecified: Secondary | ICD-10-CM | POA: Insufficient documentation

## 2021-06-07 DIAGNOSIS — Z79899 Other long term (current) drug therapy: Secondary | ICD-10-CM | POA: Insufficient documentation

## 2021-06-07 DIAGNOSIS — D631 Anemia in chronic kidney disease: Secondary | ICD-10-CM | POA: Insufficient documentation

## 2021-06-07 LAB — CMP (CANCER CENTER ONLY)
ALT: 15 U/L (ref 0–44)
AST: 27 U/L (ref 15–41)
Albumin: 3.9 g/dL (ref 3.5–5.0)
Alkaline Phosphatase: 72 U/L (ref 38–126)
Anion gap: 8 (ref 5–15)
BUN: 21 mg/dL (ref 8–23)
CO2: 24 mmol/L (ref 22–32)
Calcium: 10.4 mg/dL — ABNORMAL HIGH (ref 8.9–10.3)
Chloride: 110 mmol/L (ref 98–111)
Creatinine: 1.6 mg/dL — ABNORMAL HIGH (ref 0.44–1.00)
GFR, Estimated: 32 mL/min — ABNORMAL LOW (ref >60)
Glucose, Bld: 93 mg/dL (ref 70–99)
Potassium: 4 mmol/L (ref 3.5–5.1)
Sodium: 142 mmol/L (ref 135–145)
Total Bilirubin: 0.4 mg/dL (ref 0.3–1.2)
Total Protein: 7.5 g/dL (ref 6.5–8.1)

## 2021-06-07 LAB — CBC WITH DIFFERENTIAL (CANCER CENTER ONLY)
Abs Immature Granulocytes: 0.01 10*3/uL (ref 0.00–0.07)
Basophils Absolute: 0 10*3/uL (ref 0.0–0.1)
Basophils Relative: 1 %
Eosinophils Absolute: 0.3 10*3/uL (ref 0.0–0.5)
Eosinophils Relative: 6 %
HCT: 32.7 % — ABNORMAL LOW (ref 36.0–46.0)
Hemoglobin: 10.5 g/dL — ABNORMAL LOW (ref 12.0–15.0)
Immature Granulocytes: 0 %
Lymphocytes Relative: 29 %
Lymphs Abs: 1.8 10*3/uL (ref 0.7–4.0)
MCH: 23.2 pg — ABNORMAL LOW (ref 26.0–34.0)
MCHC: 32.1 g/dL (ref 30.0–36.0)
MCV: 72.3 fL — ABNORMAL LOW (ref 80.0–100.0)
Monocytes Absolute: 0.4 10*3/uL (ref 0.1–1.0)
Monocytes Relative: 6 %
Neutro Abs: 3.5 10*3/uL (ref 1.7–7.7)
Neutrophils Relative %: 58 %
Platelet Count: 232 10*3/uL (ref 150–400)
RBC: 4.52 MIL/uL (ref 3.87–5.11)
RDW: 16.4 % — ABNORMAL HIGH (ref 11.5–15.5)
WBC Count: 6 10*3/uL (ref 4.0–10.5)
nRBC: 0 % (ref 0.0–0.2)

## 2021-06-07 IMAGING — CT CT CHEST-ABD-PELV W/O CM
2 of 4 series · 14 of 36 positions shown, 16 images · non-contrast
Comparison: [DATE] PET.  [DATE] chest CT.

CLINICAL DATA: Stage IV breast cancer. Evaluate treatment response.
Ongoing chemotherapy. * Tracking Code: BO *



[Series 2: cap w/o · axial · non-contrast · 0.78mm/px · z∈[-518,-18]mm · 11 of 120 slices shown, 13 images]
[im 10/120  mediastinal]
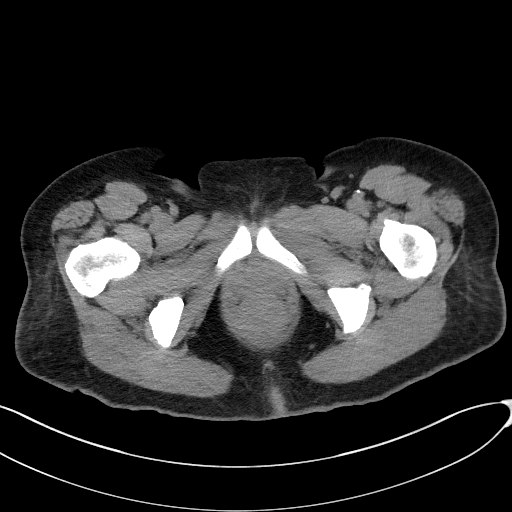
[im 10/120  bone]
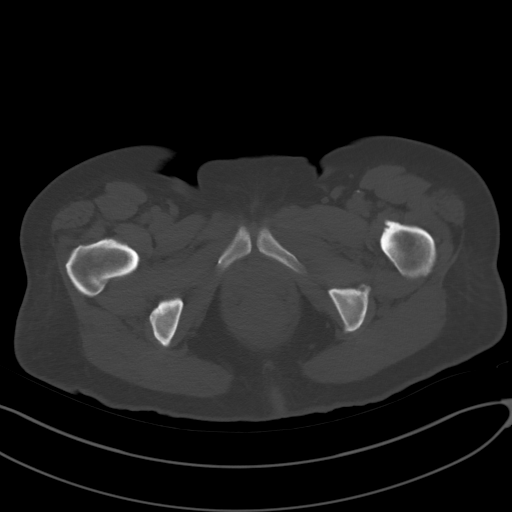
[im 20/120  mediastinal]
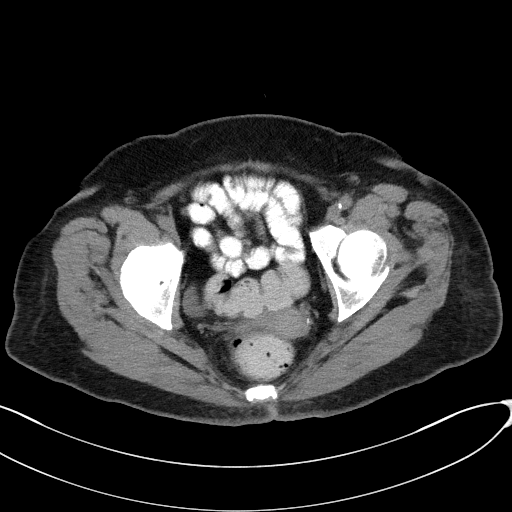
[im 30/120  mediastinal]
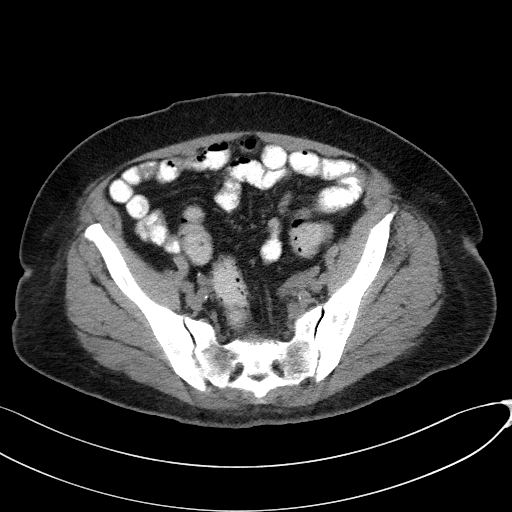
[im 40/120  mediastinal]
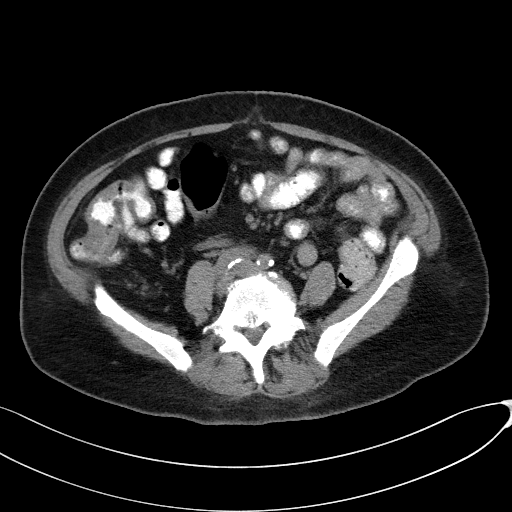
[im 50/120  mediastinal]
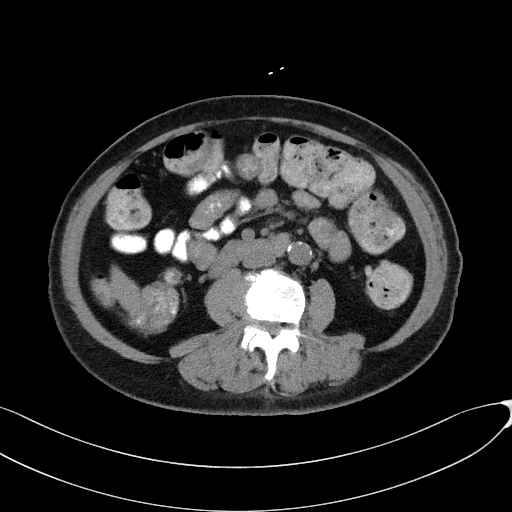
[im 60/120  mediastinal]
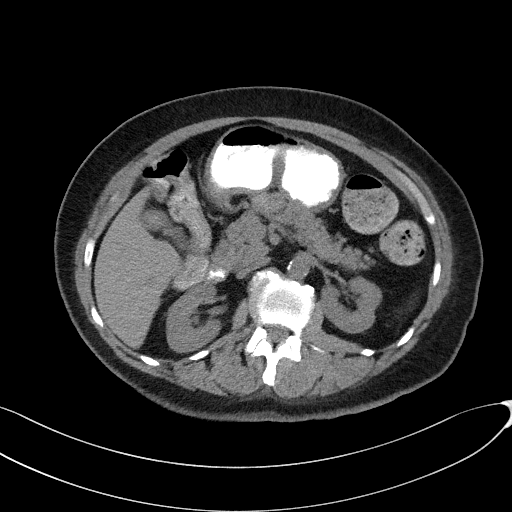
[im 70/120  mediastinal]
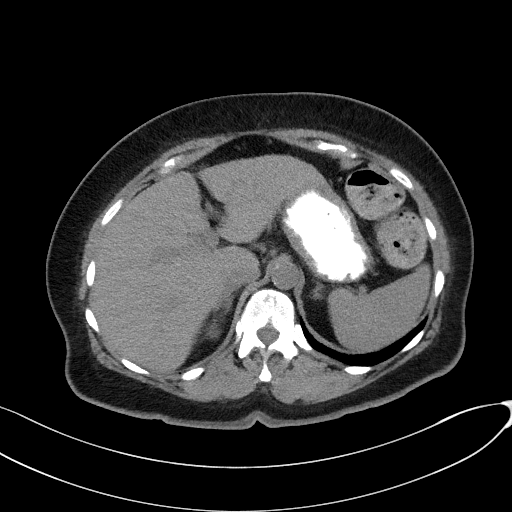
[im 80/120  mediastinal]
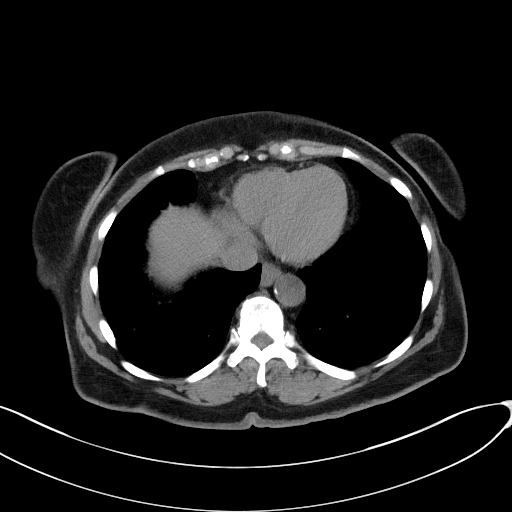
[im 90/120  mediastinal]
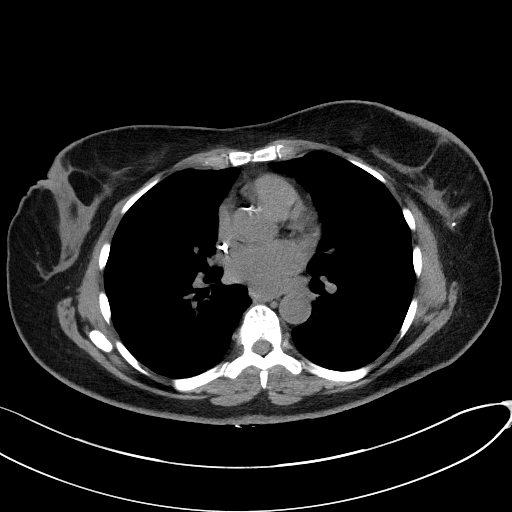
[im 90/120  bone]
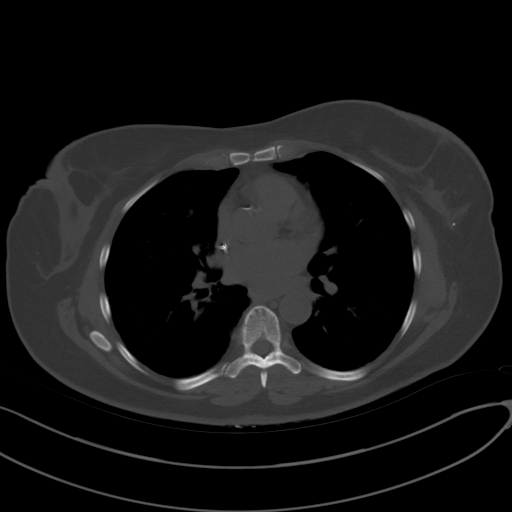
[im 100/120  mediastinal]
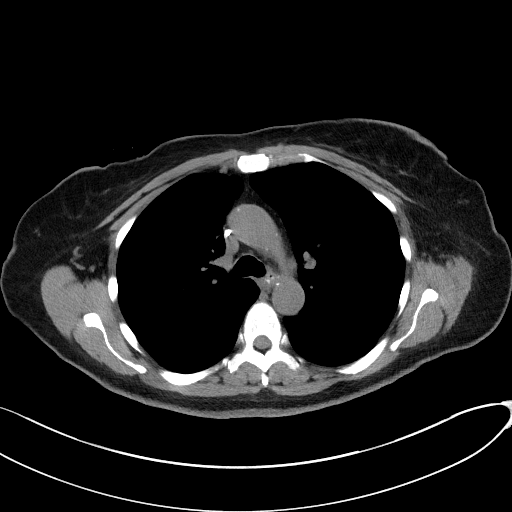
[im 110/120  mediastinal]
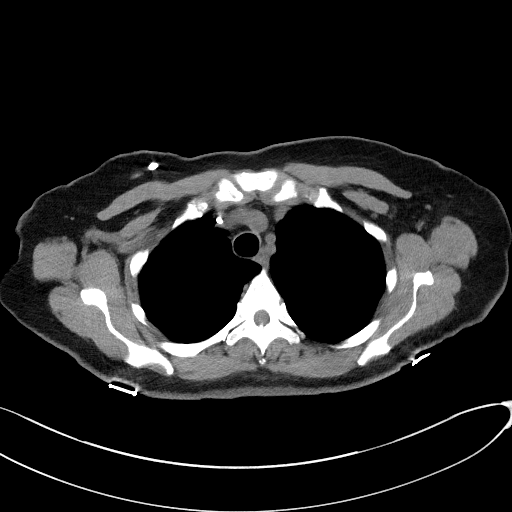

[Series 5: coronals · coronal · 0.68mm/px · 3 of 121 slices shown]
[im 25/121  mediastinal]
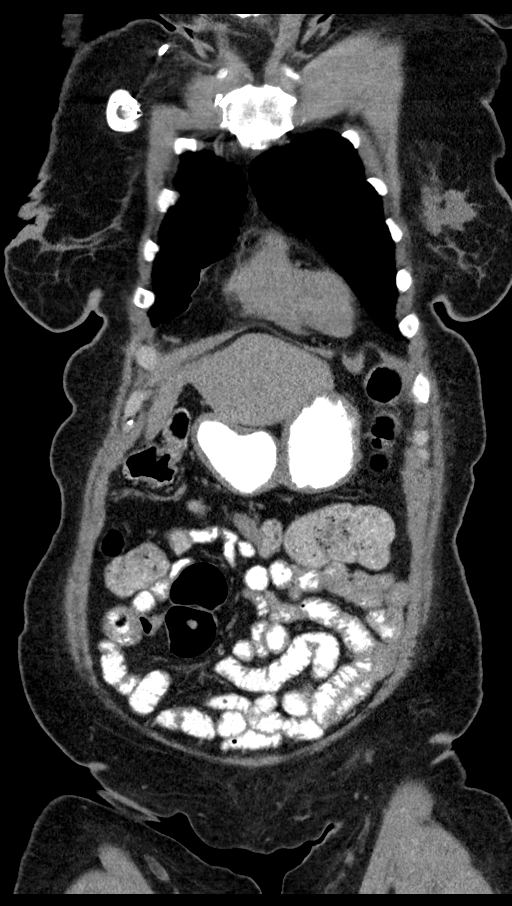
[im 49/121  mediastinal]
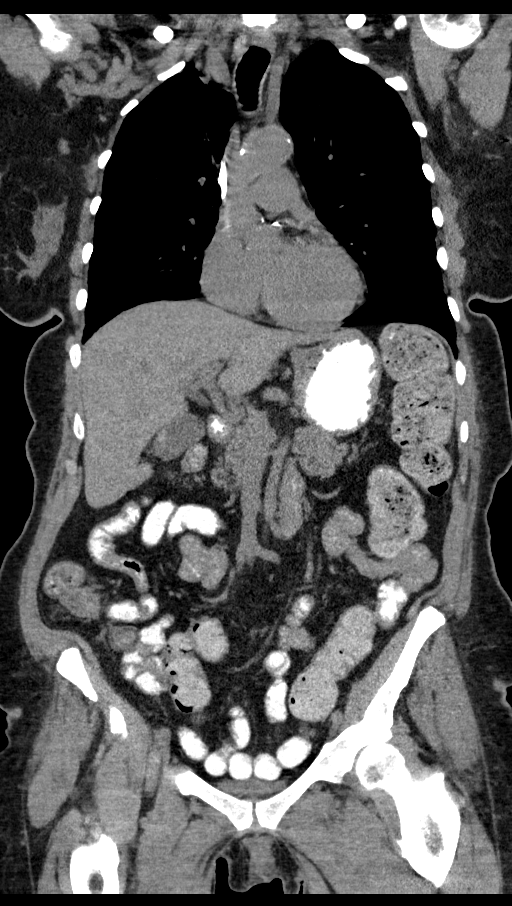
[im 73/121  mediastinal]
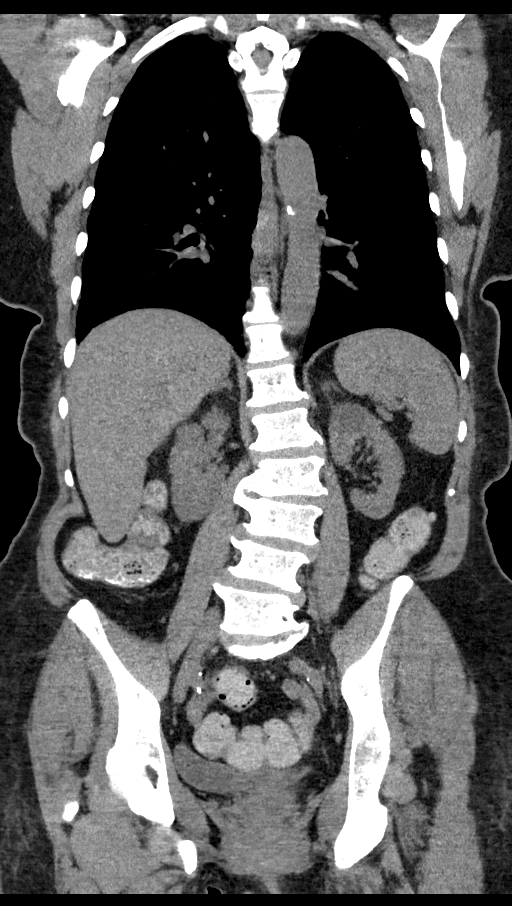

[14 of 36 positions shown; findings below may reference images not displayed]

FINDINGS: CT CHEST FINDINGS

Cardiovascular: Right Port-A-Cath tip superior caval/atrial
junction. Aortic atherosclerosis. Normal heart size, without
pericardial effusion. Lad coronary artery calcification.

Mediastinum/Nodes: No supraclavicular adenopathy. A left axillary
node measures 5 mm on [DATE] and is similar to on the prior exam. Node
or biopsy clip in the left axilla. No mediastinal or definite hilar
adenopathy, given limitations of unenhanced CT. No internal mammary
adenopathy.

Lungs/Pleura: No pleural fluid. Mild centrilobular emphysema.
Cylindrical bronchiectasis in the right greater than left lower
lobes is presumably post infectious or inflammatory. Right apical
scarring.

The right lower lobe ground-glass nodule has resolved since the
prior PET. Scattered pulmonary nodules in the order of 3 mm and less
are similar and marked on series 4.

Musculoskeletal: Left breast skin thickening may be treatment
related. No acute osseous abnormality.

CT ABDOMEN PELVIS FINDINGS

Hepatobiliary: Normal liver. Normal gallbladder, without biliary
ductal dilatation.

Pancreas: Normal, without mass or ductal dilatation.

Spleen: Normal in size, without focal abnormality.

Adrenals/Urinary Tract: Normal adrenal glands. Punctate left renal
collecting system calculi. Upper pole low-density left renal lesion
of 3.0 cm is likely a cyst. No hydronephrosis. Normal urinary
bladder.

Stomach/Bowel: Normal stomach, without wall thickening. Normal colon
and terminal ileum. Normal small bowel.

Vascular/Lymphatic: Aortic atherosclerosis. No abdominopelvic
adenopathy.

Reproductive: Normal uterus and adnexa.

Other: No significant free fluid. Mild pelvic floor laxity. No free
intraperitoneal air. No evidence of omental or peritoneal disease.

Musculoskeletal: Mild convex left lumbar spine curvature.
Lumbosacral spondylosis.
IMPRESSION: 1. Scattered tiny bilateral pulmonary nodules are similar to the
prior exams, favored to be benign.
2. Otherwise, no evidence of metastatic disease within the chest,
abdomen, or pelvis.
3. Right lower lobe ground-glass nodule has resolved since the prior
PET
4.  Possible constipation.
5. Coronary artery atherosclerosis. Aortic Atherosclerosis
([PS]-[PS]). Emphysema ([PS]-[PS]).
6. Left nephrolithiasis.

## 2021-06-07 MED ORDER — HEPARIN SOD (PORK) LOCK FLUSH 100 UNIT/ML IV SOLN
500.0000 [IU] | Freq: Once | INTRAVENOUS | Status: AC
Start: 2021-06-07 — End: 2021-06-07

## 2021-06-07 MED ORDER — HEPARIN SOD (PORK) LOCK FLUSH 100 UNIT/ML IV SOLN
INTRAVENOUS | Status: AC
  Administered 2021-06-07: 500 [IU] via INTRAVENOUS
  Filled 2021-06-07: qty 5

## 2021-06-07 MED ORDER — SODIUM CHLORIDE 0.9% FLUSH
10.0000 mL | Freq: Once | INTRAVENOUS | Status: AC
  Administered 2021-06-07: 10 mL

## 2021-06-08 LAB — CANCER ANTIGEN 27.29: CA 27.29: 28.4 U/mL (ref 0.0–38.6)

## 2021-06-09 ENCOUNTER — Inpatient Hospital Stay (HOSPITAL_BASED_OUTPATIENT_CLINIC_OR_DEPARTMENT_OTHER): Payer: Medicare Other | Admitting: Hematology

## 2021-06-09 ENCOUNTER — Inpatient Hospital Stay: Payer: Medicare Other

## 2021-06-09 ENCOUNTER — Other Ambulatory Visit: Payer: Self-pay

## 2021-06-09 ENCOUNTER — Encounter: Payer: Self-pay | Admitting: Hematology

## 2021-06-09 ENCOUNTER — Other Ambulatory Visit: Payer: Medicare Other

## 2021-06-09 VITALS — BP 156/76 | HR 69 | Temp 98.5°F | Resp 18 | Ht 64.0 in | Wt 161.3 lb

## 2021-06-09 VITALS — BP 141/91 | HR 73 | Temp 97.9°F | Resp 18

## 2021-06-09 DIAGNOSIS — Z803 Family history of malignant neoplasm of breast: Secondary | ICD-10-CM | POA: Diagnosis not present

## 2021-06-09 DIAGNOSIS — C50412 Malignant neoplasm of upper-outer quadrant of left female breast: Secondary | ICD-10-CM

## 2021-06-09 DIAGNOSIS — M4800 Spinal stenosis, site unspecified: Secondary | ICD-10-CM | POA: Diagnosis not present

## 2021-06-09 DIAGNOSIS — I129 Hypertensive chronic kidney disease with stage 1 through stage 4 chronic kidney disease, or unspecified chronic kidney disease: Secondary | ICD-10-CM | POA: Diagnosis not present

## 2021-06-09 DIAGNOSIS — C7800 Secondary malignant neoplasm of unspecified lung: Secondary | ICD-10-CM | POA: Diagnosis not present

## 2021-06-09 DIAGNOSIS — R634 Abnormal weight loss: Secondary | ICD-10-CM | POA: Diagnosis not present

## 2021-06-09 DIAGNOSIS — Z171 Estrogen receptor negative status [ER-]: Secondary | ICD-10-CM | POA: Diagnosis not present

## 2021-06-09 DIAGNOSIS — Z8673 Personal history of transient ischemic attack (TIA), and cerebral infarction without residual deficits: Secondary | ICD-10-CM | POA: Diagnosis not present

## 2021-06-09 DIAGNOSIS — D631 Anemia in chronic kidney disease: Secondary | ICD-10-CM | POA: Diagnosis not present

## 2021-06-09 DIAGNOSIS — M4726 Other spondylosis with radiculopathy, lumbar region: Secondary | ICD-10-CM | POA: Diagnosis not present

## 2021-06-09 DIAGNOSIS — I6381 Other cerebral infarction due to occlusion or stenosis of small artery: Secondary | ICD-10-CM | POA: Diagnosis not present

## 2021-06-09 DIAGNOSIS — G8929 Other chronic pain: Secondary | ICD-10-CM | POA: Diagnosis not present

## 2021-06-09 DIAGNOSIS — Z79899 Other long term (current) drug therapy: Secondary | ICD-10-CM | POA: Diagnosis not present

## 2021-06-09 DIAGNOSIS — I700 Atherosclerosis of aorta: Secondary | ICD-10-CM | POA: Diagnosis not present

## 2021-06-09 DIAGNOSIS — M5116 Intervertebral disc disorders with radiculopathy, lumbar region: Secondary | ICD-10-CM | POA: Diagnosis not present

## 2021-06-09 DIAGNOSIS — N200 Calculus of kidney: Secondary | ICD-10-CM | POA: Diagnosis not present

## 2021-06-09 DIAGNOSIS — J439 Emphysema, unspecified: Secondary | ICD-10-CM | POA: Diagnosis not present

## 2021-06-09 DIAGNOSIS — I6782 Cerebral ischemia: Secondary | ICD-10-CM | POA: Diagnosis not present

## 2021-06-09 DIAGNOSIS — E1122 Type 2 diabetes mellitus with diabetic chronic kidney disease: Secondary | ICD-10-CM | POA: Diagnosis not present

## 2021-06-09 DIAGNOSIS — N189 Chronic kidney disease, unspecified: Secondary | ICD-10-CM | POA: Diagnosis not present

## 2021-06-09 DIAGNOSIS — Z5112 Encounter for antineoplastic immunotherapy: Secondary | ICD-10-CM | POA: Diagnosis present

## 2021-06-09 DIAGNOSIS — I2510 Atherosclerotic heart disease of native coronary artery without angina pectoris: Secondary | ICD-10-CM | POA: Diagnosis not present

## 2021-06-09 MED ORDER — SODIUM CHLORIDE 0.9 % IV SOLN
3.0000 mg/kg | Freq: Once | INTRAVENOUS | Status: AC
  Administered 2021-06-09: 240 mg via INTRAVENOUS
  Filled 2021-06-09: qty 8

## 2021-06-09 MED ORDER — SODIUM CHLORIDE 0.9 % IV SOLN: Freq: Once | INTRAVENOUS | Status: AC

## 2021-06-09 MED ORDER — SODIUM CHLORIDE 0.9% FLUSH
10.0000 mL | INTRAVENOUS | Status: DC | PRN
  Administered 2021-06-09: 10 mL

## 2021-06-09 MED ORDER — ACETAMINOPHEN 325 MG PO TABS
650.0000 mg | ORAL_TABLET | Freq: Once | ORAL | Status: AC
  Administered 2021-06-09: 650 mg via ORAL
  Filled 2021-06-09: qty 2

## 2021-06-09 MED ORDER — HEPARIN SOD (PORK) LOCK FLUSH 100 UNIT/ML IV SOLN
500.0000 [IU] | Freq: Once | INTRAVENOUS | Status: AC | PRN
  Administered 2021-06-09: 500 [IU]

## 2021-06-09 MED ORDER — DIPHENHYDRAMINE HCL 25 MG PO CAPS
50.0000 mg | ORAL_CAPSULE | Freq: Once | ORAL | Status: AC
  Administered 2021-06-09: 50 mg via ORAL
  Filled 2021-06-09: qty 2

## 2021-06-09 NOTE — Progress Notes (Signed)
Ok to treat with creat 1.60 mg/dL per Dr Burr Medico ?

## 2021-06-09 NOTE — Progress Notes (Signed)
?[facility]   ?[phone] [phone]   ?Clinic Follow up Note  ? ?Patient Care Team: ?Nolene Ebbs, MD as PCP - General (Internal Medicine) ?Mauro Kaufmann, RN as Oncology Nurse Navigator ?Rockwell Germany, RN as Oncology Nurse Navigator ? ?Date of Service:  06/09/2021 ? ?CHIEF COMPLAINT: f/u of metastatic breast cancer ? ?CURRENT THERAPY:  ?Kadcyla every 3 weeks, started on 09/11/20 ? ?ASSESSMENT & PLAN:  ?Christy [name] is a 81 y.o. female with  ? ?1. Malignant neoplasm of overlapping sites in the central and upper outer left breast, IDC, cT2N3M1 stage IV, grade 2-3, ER-/PR- HER2+, MIB-1 of 20% - with nodal and pulmonary metastases ?-She initially self palpated a left breast mass in 06/2020, possibly earlier. Work-up showed 2 masses in the left upper outer breast and 3 abnormal lymph nodes. Biopsy of the 2 masses and 1 lymph node all showed IDC, grade 2-3, ER/PR negative and HER2 positive. ?-PET scan 08/28/20 shows the hypermetabolic known left breast malignancy with hypermetabolic left axillary, retropectoral, and supraclavicular adenopathy.  Additionally there are several small 3-6 mm pulmonary nodules with mild hypermetabolic activity, felt to be consistent with pulmonary metastasis.  ?-baseline labs which shows CKD and mild anemia, CA 27.29 is normal. Baseline echo EF 55-60% ?-She was started on first-line systemic Kadcyla, given once every 3 weeks, on 09/11/20. She tolerates well with some fatigue, dose decreased with C4 ?-repeat echo 04/28/21 was stable. ?-CT CAP on 06/07/21 showed significant decrease in the size of lymph node in the left axilla, no other convincing metastasis on scan. I reviewed the results with them today.  ?-I discussed that CT is not the best to evaluate the breast, so we can continue annual mammograms. On physical exam, her breast mass is no longer palpable (previously 2.5 cm in 01/2021). ?-Labs reviewed, overall adequate for treatment.  ?  ?2. Symptom  management: fatigue, weight loss ?-she does not eat enough. Given her DM, I recommend she use Glucerna for nutritional supplement. ?-she continues to report fatigue ?  ?3. Genetics ?-Her sister had metastatic breast cancer in her 64s, and that sister's daughter possibly have breast cancer. ?-Due to family history and ER/PR negative disease, patient qualifies for genetics. She has not been evaluated or tested yet. ?-Patient's daughter understands her breast cancer risk and the importance of annual mammography. ?  ?4. Spinal stenosis, H/o Fall ?-Reportedly diagnosed in [place], [place].  Patient has chronic back pain and left leg weakness at baseline ?-She now also has right leg weakness and is ambulating with a walker. ?-PET is negative for spinal mets and brain MRI shows subacute lacunar infarct without metastatic disease  ?-she had a fall and hit her head in 01/2021. Work up was negative. ?  ?5. HTN, DM ?-on metformin, amlodipine, and olmesartan ?-f/up PCP Dr. Jeanie Cooks ?-Denies baseline neuropathy ?  ?6. Subacute stroke ?-Her brain MRI showed subacute lacunar infarct of the left basal ganglia, she has no clinical symptoms of stroke. ?-She was seen by Dr. Mickeal Skinner on 09/17/20. He recommended cholesterol medicine and full-dose aspirin. She was prescribed lipitor at that time. ?  ?  ?PLAN: ?-proceed with C12 Kadcyla with same dose reduction to 42m/kg ?-lab, and Kadcyla in 3 and 6 weeks ?-f/u in 6 weeks ? ? ?No problem-specific Assessment & Plan notes found for this encounter. ? ? ?SUMMARY OF ONCOLOGIC HISTORY: ?Oncology History Overview Note  ?Cancer Staging ?Malignant neoplasm of upper-outer quadrant of female breast ([facility] ?Staging form: Breast, AJCC  8th Edition ?- Clinical stage from 07/31/2020: Stage IV (cT2, cN3, cM1, G2, ER-, PR-, HER2+) - Signed by Alla Feeling, NP on 08/31/2020 ?Stage prefix: Initial diagnosis ?Nuclear grade: G2 ?Histologic grading system: 3 grade system ? ?  ?Malignant neoplasm of upper-outer  quadrant of female breast ([place])  ?07/24/2020 Breast US  ? On physical exam, a very firm mass is identified at the 1:30 ?position of the LEFT breast 5 cm from the nipple. ?  ?IMPRESSION: ?1. Highly suspicious 4.3 cm mass at the 1:30 position of the LEFT ?breast and highly suspicious 2.7 cm mass at the 2 o'clock position ?of the LEFT breast, with the 2 masses encompassing an area measuring at least 6.1 cm. ?2. 3 abnormal LEFT axillary lymph nodes with cortical thickening, ?tissue sampling of 1 of these lymph nodes recommended. ?3. Anterior LEFT breast skin thickening nonspecific but may reflect dermal involvement. ?4. No mammographic evidence of RIGHT breast malignancy. ?  ?07/31/2020 Initial Biopsy  ? Diagnosis ?1. Breast, left, needle core biopsy, 1 :30 pm, 5cmfn ?- INVASIVE DUCTAL CARCINOMA, SEE COMMENT. ?2. Breast, left, needle core biopsy, 2 o'clock, 8cmfn ?- INVASIVE DUCTAL CARCINOMA, SEE COMMENT. ?- DUCTAL CARCINOMA IN SITU. ?3. Lymph node, needle/core biopsy, left axillary ?- METASTATIC CARCINOMA IN A LYMPH NODE. ?Microscopic Comment ?1. and 3. The carcinoma in both parts has a similar morphology and appears grade 2-3. The DCIS in part 2 is high-grade with necrosis. The carcinoma measures 15 mm (part 1) and 14 mm (part 2) in greatest linear extent ? ?2. PROGNOSTIC INDICATORS ?Results: ?IMMUNOHISTOCHEMICAL AND MORPHOMETRIC ANALYSIS PERFORMED MANUALLY ?The tumor cells are POSITIVE for Her2 (3+). ?Estrogen Receptor: 0%, NEGATIVE ?Progesterone Receptor: 0%, NEGATIVE ?Proliferation Marker Ki67: 20% ? ?  ?07/31/2020 Cancer Staging  ? Staging form: Breast, AJCC 8th Edition ?- Clinical stage from 07/31/2020: Stage IV (cT2, cN3, cM1, G2, ER-, PR-, HER2+) - Signed by Alla Feeling, NP on 08/31/2020 ?Stage prefix: Initial diagnosis ?Nuclear grade: G2 ?Histologic grading system: 3 grade system ? ?  ?08/13/2020 Initial Diagnosis  ? Malignant neoplasm of upper-outer quadrant of female breast [facility]) ?  ?08/28/2020 PET scan  ?  IMPRESSION: ?Signs of LEFT breast cancer with LEFT axillary and retropectoral ?adenopathy as well as LEFT supraclavicular adenopathy. ?  ?Signs of pulmonary metastatic disease. ?  ?Mild asymmetric uptake in the LEFT as compared to the RIGHT adrenal gland. Equivocal but suspicious, attention on follow-up. ?  ?Aortic atherosclerosis. ?  ?Cystic changes and potential bronchiectasis in the RIGHT lung base likely post infectious or inflammatory. Correlate with any ?respiratory symptoms. Comparison with prior imaging may be helpful with attention on follow-up. ?  ?08/28/2020 Imaging  ? BRAIN MRI IMPRESSION: ?1. Subacute lacunar infarct of the left basal ganglia with no ?malignant hemorrhagic transformation or mass effect. Underlying ?chronic bilateral basal ganglia ischemia. ?2. No metastatic disease or other acute intracranial abnormality ?identified. ?  ?08/28/2020 Imaging  ? Breast MRI ?  ?IMPRESSION: ?1. 8.1 x 8.1 x 5.1 cm area of biopsy-proven malignancy in the ?central, upper outer and lower outer quadrants of the left breast. ?2. Diffuse anterior left breast skin thickening without abnormal ?enhancement. This may represent thickening due to lymphedema ?associated with lymphatic obstruction. ?3. Metastatic level 1 and level 2 left axillary adenopathy. ?4. No evidence of malignancy on the right. ?  ?09/11/2020 -  Chemotherapy  ? Patient is on Treatment Plan : BREAST ADO-Trastuzumab Emtansine (Kadcyla) q21d  ?   ?11/30/2020 Imaging  ? CT Chest w/o contrast ? ?  IMPRESSION: ?1. Interval response to therapy. The left breast mass has mildly ?decreased in size in the interval. ?2. Decrease in size of left axillary and left retropectoral lymph ?nodes. ?3. Small right lung nodules stable to improved. ?4. Coronary artery calcifications noted. ?5. Aortic Atherosclerosis (ICD10-I70.0). ?  ?03/04/2021 PET scan  ? IMPRESSION: ?1. Overall marked improvement compared to the prior PET-CT, with resolution of hypermetabolic activity and  pathologic enlargement of the previously involved lymph nodes; lack of hypermetabolic activity and nonvisualization of prior right-sided lung nodules, and substantial reduction in size and activity in the left breas

## 2021-06-09 NOTE — Patient Instructions (Signed)
[facility]  Discharge Instructions: ?Thank you for choosing [facility] to provide your oncology and hematology care.  ? ?If you have a lab appointment with the [facility], please go directly to the [facility] and check in at the registration area. ?  ?Wear comfortable clothing and clothing appropriate for easy access to any Portacath or PICC line.  ? ?We strive to give you quality time with your provider. You may need to reschedule your appointment if you arrive late (15 or more minutes).  Arriving late affects you and other patients whose appointments are after yours.  Also, if you miss three or more appointments without notifying the office, you may be dismissed from the clinic at the provider?s discretion.    ?  ?For prescription refill requests, have your pharmacy contact our office and allow 72 hours for refills to be completed.   ? ?Today you received the following chemotherapy and/or immunotherapy agents: Kadcyla    ?  ?To help prevent nausea and vomiting after your treatment, we encourage you to take your nausea medication as directed. ? ?BELOW ARE SYMPTOMS THAT SHOULD BE REPORTED IMMEDIATELY: ?*FEVER GREATER THAN 100.4 F (38 ?C) OR HIGHER ?*CHILLS OR SWEATING ?*NAUSEA AND VOMITING THAT IS NOT CONTROLLED WITH YOUR NAUSEA MEDICATION ?*UNUSUAL SHORTNESS OF BREATH ?*UNUSUAL BRUISING OR BLEEDING ?*URINARY PROBLEMS (pain or burning when urinating, or frequent urination) ?*BOWEL PROBLEMS (unusual diarrhea, constipation, pain near the anus) ?TENDERNESS IN MOUTH AND THROAT WITH OR WITHOUT PRESENCE OF ULCERS (sore throat, sores in mouth, or a toothache) ?UNUSUAL RASH, SWELLING OR PAIN  ?UNUSUAL VAGINAL DISCHARGE OR ITCHING  ? ?Items with * indicate a potential emergency and should be followed up as soon as possible or go to the Emergency Department if any problems should occur. ? ?Please show the CHEMOTHERAPY ALERT CARD or IMMUNOTHERAPY ALERT CARD at check-in to  the Emergency Department and triage nurse. ? ?Should you have questions after your visit or need to cancel or reschedule your appointment, please contact [facility]  Dept: [phone]  and follow the prompts.  Office hours are 8:00 a.m. to 4:30 p.m. Monday - Friday. Please note that voicemails left after 4:00 p.m. may not be returned until the following business day.  We are closed weekends and major holidays. You have access to a nurse at all times for urgent questions. Please call the main number to the clinic Dept: [phone] and follow the prompts. ? ? ?For any non-urgent questions, you may also contact your provider using MyChart. We now offer e-Visits for anyone 59 and older to request care online for non-urgent symptoms. For details visit [email or web address]. ?  ?Also download the MyChart app! Go to the app store, search "MyChart", open the app, select [facility], and log in with your MyChart username and password. ? ?Due to Covid, a mask is required upon entering the hospital/clinic. If you do not have a mask, one will be given to you upon arrival. For doctor visits, patients may have 1 support person aged 55 or older with them. For treatment visits, patients cannot have anyone with them due to current Covid guidelines and our immunocompromised population.  ? ?

## 2021-06-11 ENCOUNTER — Telehealth: Payer: Self-pay | Admitting: Hematology

## 2021-06-11 NOTE — Telephone Encounter (Signed)
Left message with follow-up appointments per 4/5 los. ?

## 2021-06-30 ENCOUNTER — Inpatient Hospital Stay: Payer: Medicare Other

## 2021-06-30 ENCOUNTER — Other Ambulatory Visit: Payer: Self-pay | Admitting: Hematology

## 2021-06-30 ENCOUNTER — Other Ambulatory Visit: Payer: Self-pay

## 2021-06-30 VITALS — BP 135/73 | HR 75 | Temp 97.7°F | Resp 18 | Wt 159.4 lb

## 2021-06-30 DIAGNOSIS — Z171 Estrogen receptor negative status [ER-]: Secondary | ICD-10-CM

## 2021-06-30 DIAGNOSIS — Z95828 Presence of other vascular implants and grafts: Secondary | ICD-10-CM

## 2021-06-30 DIAGNOSIS — C50412 Malignant neoplasm of upper-outer quadrant of left female breast: Secondary | ICD-10-CM | POA: Diagnosis not present

## 2021-06-30 LAB — CBC WITH DIFFERENTIAL (CANCER CENTER ONLY)
Abs Immature Granulocytes: 0.01 10*3/uL (ref 0.00–0.07)
Basophils Absolute: 0 10*3/uL (ref 0.0–0.1)
Basophils Relative: 1 %
Eosinophils Absolute: 0.2 10*3/uL (ref 0.0–0.5)
Eosinophils Relative: 4 %
HCT: 31.9 % — ABNORMAL LOW (ref 36.0–46.0)
Hemoglobin: 10.4 g/dL — ABNORMAL LOW (ref 12.0–15.0)
Immature Granulocytes: 0 %
Lymphocytes Relative: 33 %
Lymphs Abs: 1.9 10*3/uL (ref 0.7–4.0)
MCH: 23.4 pg — ABNORMAL LOW (ref 26.0–34.0)
MCHC: 32.6 g/dL (ref 30.0–36.0)
MCV: 71.7 fL — ABNORMAL LOW (ref 80.0–100.0)
Monocytes Absolute: 0.4 10*3/uL (ref 0.1–1.0)
Monocytes Relative: 7 %
Neutro Abs: 3.1 10*3/uL (ref 1.7–7.7)
Neutrophils Relative %: 55 %
Platelet Count: 217 10*3/uL (ref 150–400)
RBC: 4.45 MIL/uL (ref 3.87–5.11)
RDW: 15.9 % — ABNORMAL HIGH (ref 11.5–15.5)
WBC Count: 5.7 10*3/uL (ref 4.0–10.5)
nRBC: 0 % (ref 0.0–0.2)

## 2021-06-30 LAB — CMP (CANCER CENTER ONLY)
ALT: 16 U/L (ref 0–44)
AST: 28 U/L (ref 15–41)
Albumin: 3.8 g/dL (ref 3.5–5.0)
Alkaline Phosphatase: 75 U/L (ref 38–126)
Anion gap: 9 (ref 5–15)
BUN: 20 mg/dL (ref 8–23)
CO2: 27 mmol/L (ref 22–32)
Calcium: 10.3 mg/dL (ref 8.9–10.3)
Chloride: 108 mmol/L (ref 98–111)
Creatinine: 1.68 mg/dL — ABNORMAL HIGH (ref 0.44–1.00)
GFR, Estimated: 30 mL/min — ABNORMAL LOW (ref >60)
Glucose, Bld: 111 mg/dL — ABNORMAL HIGH (ref 70–99)
Potassium: 3.8 mmol/L (ref 3.5–5.1)
Sodium: 144 mmol/L (ref 135–145)
Total Bilirubin: 0.3 mg/dL (ref 0.3–1.2)
Total Protein: 7.2 g/dL (ref 6.5–8.1)

## 2021-06-30 MED ORDER — SODIUM CHLORIDE 0.9 % IV SOLN: Freq: Once | INTRAVENOUS | Status: AC

## 2021-06-30 MED ORDER — ACETAMINOPHEN 325 MG PO TABS
650.0000 mg | ORAL_TABLET | Freq: Once | ORAL | Status: AC
  Administered 2021-06-30: 650 mg via ORAL
  Filled 2021-06-30: qty 2

## 2021-06-30 MED ORDER — SODIUM CHLORIDE 0.9 % IV SOLN
2.8000 mg/kg | Freq: Once | INTRAVENOUS | Status: AC
  Administered 2021-06-30: 200 mg via INTRAVENOUS
  Filled 2021-06-30: qty 10

## 2021-06-30 MED ORDER — HEPARIN SOD (PORK) LOCK FLUSH 100 UNIT/ML IV SOLN
500.0000 [IU] | Freq: Once | INTRAVENOUS | Status: AC | PRN
  Administered 2021-06-30: 500 [IU]

## 2021-06-30 MED ORDER — SODIUM CHLORIDE 0.9% FLUSH
10.0000 mL | INTRAVENOUS | Status: DC | PRN
  Administered 2021-06-30: 10 mL

## 2021-06-30 MED ORDER — SODIUM CHLORIDE 0.9% FLUSH
10.0000 mL | Freq: Once | INTRAVENOUS | Status: AC
  Administered 2021-06-30: 10 mL

## 2021-06-30 MED ORDER — DIPHENHYDRAMINE HCL 25 MG PO CAPS
50.0000 mg | ORAL_CAPSULE | Freq: Once | ORAL | Status: AC
  Administered 2021-06-30: 50 mg via ORAL
  Filled 2021-06-30: qty 2

## 2021-06-30 NOTE — Patient Instructions (Signed)
[facility]  Discharge Instructions: ?Thank you for choosing [facility] to provide your oncology and hematology care.  ? ?If you have a lab appointment with the Cancer Center, please go directly to the Cancer Center and check in at the registration area. ?  ?Wear comfortable clothing and clothing appropriate for easy access to any Portacath or PICC line.  ? ?We strive to give you quality time with your provider. You may need to reschedule your appointment if you arrive late (15 or more minutes).  Arriving late affects you and other patients whose appointments are after yours.  Also, if you miss three or more appointments without notifying the office, you may be dismissed from the clinic at the provider?s discretion.    ?  ?For prescription refill requests, have your pharmacy contact our office and allow 72 hours for refills to be completed.   ? ?Today you received the following chemotherapy and/or immunotherapy agents: Trastuzumab    ?  ?To help prevent nausea and vomiting after your treatment, we encourage you to take your nausea medication as directed. ? ?BELOW ARE SYMPTOMS THAT SHOULD BE REPORTED IMMEDIATELY: ?*FEVER GREATER THAN 100.4 F (38 ?C) OR HIGHER ?*CHILLS OR SWEATING ?*NAUSEA AND VOMITING THAT IS NOT CONTROLLED WITH YOUR NAUSEA MEDICATION ?*UNUSUAL SHORTNESS OF BREATH ?*UNUSUAL BRUISING OR BLEEDING ?*URINARY PROBLEMS (pain or burning when urinating, or frequent urination) ?*BOWEL PROBLEMS (unusual diarrhea, constipation, pain near the anus) ?TENDERNESS IN MOUTH AND THROAT WITH OR WITHOUT PRESENCE OF ULCERS (sore throat, sores in mouth, or a toothache) ?UNUSUAL RASH, SWELLING OR PAIN  ?UNUSUAL VAGINAL DISCHARGE OR ITCHING  ? ?Items with * indicate a potential emergency and should be followed up as soon as possible or go to the Emergency Department if any problems should occur. ? ?Please show the CHEMOTHERAPY ALERT CARD or IMMUNOTHERAPY ALERT CARD at check-in  to the Emergency Department and triage nurse. ? ?Should you have questions after your visit or need to cancel or reschedule your appointment, please contact [facility]  Dept: [phone]  and follow the prompts.  Office hours are 8:00 a.m. to 4:30 p.m. Monday - Friday. Please note that voicemails left after 4:00 p.m. may not be returned until the following business day.  We are closed weekends and major holidays. You have access to a nurse at all times for urgent questions. Please call the main number to the clinic Dept: [phone] and follow the prompts. ? ? ?For any non-urgent questions, you may also contact your provider using MyChart. We now offer e-Visits for anyone 18 and older to request care online for non-urgent symptoms. For details visit [email or web address]. ?  ?Also download the MyChart app! Go to the app store, search "MyChart", open the app, select [facility], and log in with your MyChart username and password. ? ?Due to Covid, a mask is required upon entering the hospital/clinic. If you do not have a mask, one will be given to you upon arrival. For doctor visits, patients may have 1 support person aged 18 or older with them. For treatment visits, patients cannot have anyone with them due to current Covid guidelines and our immunocompromised population.  ? ?

## 2021-06-30 NOTE — Progress Notes (Signed)
Per Burr Medico MD, ok to treat today with SCR 1.68. ?

## 2021-06-30 NOTE — Progress Notes (Signed)
OK to adj today's Kadcyla dose w/ most recent wt per Dr Burr Medico. ? ?Kennith [place], Pharm.D., CPP ?06/30/2021@2 :16 PM ? ? ?

## 2021-07-01 LAB — CANCER ANTIGEN 27.29: CA 27.29: 34.1 U/mL (ref 0.0–38.6)

## 2021-07-08 ENCOUNTER — Other Ambulatory Visit: Payer: Self-pay | Admitting: Hematology

## 2021-07-08 ENCOUNTER — Other Ambulatory Visit: Payer: Self-pay | Admitting: Internal Medicine

## 2021-07-21 ENCOUNTER — Inpatient Hospital Stay: Payer: Medicare Other | Attending: Nurse Practitioner | Admitting: Hematology

## 2021-07-21 ENCOUNTER — Other Ambulatory Visit: Payer: Self-pay

## 2021-07-21 ENCOUNTER — Encounter: Payer: Self-pay | Admitting: Hematology

## 2021-07-21 ENCOUNTER — Inpatient Hospital Stay: Payer: Medicare Other

## 2021-07-21 VITALS — BP 126/50 | HR 76 | Temp 98.8°F | Resp 18 | Ht 64.0 in | Wt 161.6 lb

## 2021-07-21 DIAGNOSIS — D649 Anemia, unspecified: Secondary | ICD-10-CM | POA: Diagnosis not present

## 2021-07-21 DIAGNOSIS — M5116 Intervertebral disc disorders with radiculopathy, lumbar region: Secondary | ICD-10-CM | POA: Insufficient documentation

## 2021-07-21 DIAGNOSIS — J439 Emphysema, unspecified: Secondary | ICD-10-CM | POA: Diagnosis not present

## 2021-07-21 DIAGNOSIS — Z79899 Other long term (current) drug therapy: Secondary | ICD-10-CM | POA: Insufficient documentation

## 2021-07-21 DIAGNOSIS — Z171 Estrogen receptor negative status [ER-]: Secondary | ICD-10-CM

## 2021-07-21 DIAGNOSIS — I10 Essential (primary) hypertension: Secondary | ICD-10-CM | POA: Insufficient documentation

## 2021-07-21 DIAGNOSIS — I700 Atherosclerosis of aorta: Secondary | ICD-10-CM | POA: Insufficient documentation

## 2021-07-21 DIAGNOSIS — Z8673 Personal history of transient ischemic attack (TIA), and cerebral infarction without residual deficits: Secondary | ICD-10-CM | POA: Diagnosis not present

## 2021-07-21 DIAGNOSIS — I6782 Cerebral ischemia: Secondary | ICD-10-CM | POA: Insufficient documentation

## 2021-07-21 DIAGNOSIS — Z95828 Presence of other vascular implants and grafts: Secondary | ICD-10-CM

## 2021-07-21 DIAGNOSIS — C50812 Malignant neoplasm of overlapping sites of left female breast: Secondary | ICD-10-CM | POA: Diagnosis not present

## 2021-07-21 DIAGNOSIS — I6381 Other cerebral infarction due to occlusion or stenosis of small artery: Secondary | ICD-10-CM | POA: Diagnosis not present

## 2021-07-21 DIAGNOSIS — C7800 Secondary malignant neoplasm of unspecified lung: Secondary | ICD-10-CM | POA: Insufficient documentation

## 2021-07-21 DIAGNOSIS — M4800 Spinal stenosis, site unspecified: Secondary | ICD-10-CM | POA: Insufficient documentation

## 2021-07-21 DIAGNOSIS — C50412 Malignant neoplasm of upper-outer quadrant of left female breast: Secondary | ICD-10-CM

## 2021-07-21 DIAGNOSIS — G8929 Other chronic pain: Secondary | ICD-10-CM | POA: Diagnosis not present

## 2021-07-21 DIAGNOSIS — M549 Dorsalgia, unspecified: Secondary | ICD-10-CM | POA: Insufficient documentation

## 2021-07-21 DIAGNOSIS — E119 Type 2 diabetes mellitus without complications: Secondary | ICD-10-CM | POA: Diagnosis not present

## 2021-07-21 DIAGNOSIS — D509 Iron deficiency anemia, unspecified: Secondary | ICD-10-CM | POA: Insufficient documentation

## 2021-07-21 DIAGNOSIS — M4726 Other spondylosis with radiculopathy, lumbar region: Secondary | ICD-10-CM | POA: Diagnosis not present

## 2021-07-21 DIAGNOSIS — I2510 Atherosclerotic heart disease of native coronary artery without angina pectoris: Secondary | ICD-10-CM | POA: Insufficient documentation

## 2021-07-21 DIAGNOSIS — Z803 Family history of malignant neoplasm of breast: Secondary | ICD-10-CM | POA: Insufficient documentation

## 2021-07-21 DIAGNOSIS — N200 Calculus of kidney: Secondary | ICD-10-CM | POA: Insufficient documentation

## 2021-07-21 DIAGNOSIS — Z5112 Encounter for antineoplastic immunotherapy: Secondary | ICD-10-CM | POA: Diagnosis present

## 2021-07-21 LAB — CBC WITH DIFFERENTIAL (CANCER CENTER ONLY)
Abs Immature Granulocytes: 0 10*3/uL (ref 0.00–0.07)
Basophils Absolute: 0 10*3/uL (ref 0.0–0.1)
Basophils Relative: 1 %
Eosinophils Absolute: 0.2 10*3/uL (ref 0.0–0.5)
Eosinophils Relative: 4 %
HCT: 30.3 % — ABNORMAL LOW (ref 36.0–46.0)
Hemoglobin: 10.1 g/dL — ABNORMAL LOW (ref 12.0–15.0)
Immature Granulocytes: 0 %
Lymphocytes Relative: 37 %
Lymphs Abs: 2.2 10*3/uL (ref 0.7–4.0)
MCH: 23.5 pg — ABNORMAL LOW (ref 26.0–34.0)
MCHC: 33.3 g/dL (ref 30.0–36.0)
MCV: 70.5 fL — ABNORMAL LOW (ref 80.0–100.0)
Monocytes Absolute: 0.4 10*3/uL (ref 0.1–1.0)
Monocytes Relative: 7 %
Neutro Abs: 3 10*3/uL (ref 1.7–7.7)
Neutrophils Relative %: 51 %
Platelet Count: 178 10*3/uL (ref 150–400)
RBC: 4.3 MIL/uL (ref 3.87–5.11)
RDW: 16 % — ABNORMAL HIGH (ref 11.5–15.5)
WBC Count: 5.9 10*3/uL (ref 4.0–10.5)
nRBC: 0 % (ref 0.0–0.2)

## 2021-07-21 LAB — CMP (CANCER CENTER ONLY)
ALT: 18 U/L (ref 0–44)
AST: 31 U/L (ref 15–41)
Albumin: 3.7 g/dL (ref 3.5–5.0)
Alkaline Phosphatase: 84 U/L (ref 38–126)
Anion gap: 6 (ref 5–15)
BUN: 17 mg/dL (ref 8–23)
CO2: 29 mmol/L (ref 22–32)
Calcium: 10 mg/dL (ref 8.9–10.3)
Chloride: 109 mmol/L (ref 98–111)
Creatinine: 1.74 mg/dL — ABNORMAL HIGH (ref 0.44–1.00)
GFR, Estimated: 29 mL/min — ABNORMAL LOW (ref >60)
Glucose, Bld: 105 mg/dL — ABNORMAL HIGH (ref 70–99)
Potassium: 3.7 mmol/L (ref 3.5–5.1)
Sodium: 144 mmol/L (ref 135–145)
Total Bilirubin: 0.4 mg/dL (ref 0.3–1.2)
Total Protein: 6.8 g/dL (ref 6.5–8.1)

## 2021-07-21 MED ORDER — SODIUM CHLORIDE 0.9% FLUSH
10.0000 mL | Freq: Once | INTRAVENOUS | Status: AC
  Administered 2021-07-21: 10 mL

## 2021-07-21 MED ORDER — SODIUM CHLORIDE 0.9 % IV SOLN: Freq: Once | INTRAVENOUS | Status: AC

## 2021-07-21 MED ORDER — SODIUM CHLORIDE 0.9 % IV SOLN
200.0000 mg | Freq: Once | INTRAVENOUS | Status: AC
  Administered 2021-07-21: 200 mg via INTRAVENOUS
  Filled 2021-07-21: qty 10

## 2021-07-21 MED ORDER — DIPHENHYDRAMINE HCL 25 MG PO CAPS
50.0000 mg | ORAL_CAPSULE | Freq: Once | ORAL | Status: AC
  Administered 2021-07-21: 50 mg via ORAL
  Filled 2021-07-21: qty 2

## 2021-07-21 MED ORDER — SODIUM CHLORIDE 0.9% FLUSH
10.0000 mL | INTRAVENOUS | Status: DC | PRN
  Administered 2021-07-21: 10 mL

## 2021-07-21 MED ORDER — HEPARIN SOD (PORK) LOCK FLUSH 100 UNIT/ML IV SOLN
500.0000 [IU] | Freq: Once | INTRAVENOUS | Status: AC | PRN
  Administered 2021-07-21: 500 [IU]

## 2021-07-21 MED ORDER — ACETAMINOPHEN 325 MG PO TABS
650.0000 mg | ORAL_TABLET | Freq: Once | ORAL | Status: AC
  Administered 2021-07-21: 650 mg via ORAL
  Filled 2021-07-21: qty 2

## 2021-07-21 NOTE — Patient Instructions (Signed)
[facility]  Discharge Instructions: ?Thank you for choosing [facility] to provide your oncology and hematology care.  ? ?If you have a lab appointment with the [facility], please go directly to the [facility] and check in at the registration area. ?  ?Wear comfortable clothing and clothing appropriate for easy access to any Portacath or PICC line.  ? ?We strive to give you quality time with your provider. You may need to reschedule your appointment if you arrive late (15 or more minutes).  Arriving late affects you and other patients whose appointments are after yours.  Also, if you miss three or more appointments without notifying the office, you may be dismissed from the clinic at the provider?s discretion.    ?  ?For prescription refill requests, have your pharmacy contact our office and allow 72 hours for refills to be completed.   ? ?Today you received the following chemotherapy and/or immunotherapy agent: Kadcyla    ?  ?To help prevent nausea and vomiting after your treatment, we encourage you to take your nausea medication as directed. ? ?BELOW ARE SYMPTOMS THAT SHOULD BE REPORTED IMMEDIATELY: ?*FEVER GREATER THAN 100.4 F (38 ?C) OR HIGHER ?*CHILLS OR SWEATING ?*NAUSEA AND VOMITING THAT IS NOT CONTROLLED WITH YOUR NAUSEA MEDICATION ?*UNUSUAL SHORTNESS OF BREATH ?*UNUSUAL BRUISING OR BLEEDING ?*URINARY PROBLEMS (pain or burning when urinating, or frequent urination) ?*BOWEL PROBLEMS (unusual diarrhea, constipation, pain near the anus) ?TENDERNESS IN MOUTH AND THROAT WITH OR WITHOUT PRESENCE OF ULCERS (sore throat, sores in mouth, or a toothache) ?UNUSUAL RASH, SWELLING OR PAIN  ?UNUSUAL VAGINAL DISCHARGE OR ITCHING  ? ?Items with * indicate a potential emergency and should be followed up as soon as possible or go to the Emergency Department if any problems should occur. ? ?Please show the CHEMOTHERAPY ALERT CARD or IMMUNOTHERAPY ALERT CARD at check-in to the  Emergency Department and triage nurse. ? ?Should you have questions after your visit or need to cancel or reschedule your appointment, please contact [facility]  Dept: [phone]  and follow the prompts.  Office hours are 8:00 a.m. to 4:30 p.m. Monday - Friday. Please note that voicemails left after 4:00 p.m. may not be returned until the following business day.  We are closed weekends and major holidays. You have access to a nurse at all times for urgent questions. Please call the main number to the clinic Dept: [phone] and follow the prompts. ? ? ?For any non-urgent questions, you may also contact your provider using MyChart. We now offer e-Visits for anyone 62 and older to request care online for non-urgent symptoms. For details visit [email or web address]. ?  ?Also download the MyChart app! Go to the app store, search "MyChart", open the app, select [facility], and log in with your MyChart username and password. ? ?Due to Covid, a mask is required upon entering the hospital/clinic. If you do not have a mask, one will be given to you upon arrival. For doctor visits, patients may have 1 support person aged 3 or older with them. For treatment visits, patients cannot have anyone with them due to current Covid guidelines and our immunocompromised population.  ? ?

## 2021-07-21 NOTE — Progress Notes (Addendum)
?[facility]   ?[phone] [phone]   ?Clinic Follow up Note  ? ?Patient Care Team: ?Nolene Ebbs, MD as PCP - General (Internal Medicine) ?Truitt Merle, MD as Consulting Physician (Medical Oncology) ? ?Date of Service:  07/21/2021 ? ?CHIEF COMPLAINT: f/u of metastatic breast cancer ? ?CURRENT THERAPY:  ?Kadcyla every 3 weeks, started on 09/11/20 ? ?ASSESSMENT & PLAN:  ?Christy [name] is a 81 y.o. female with  ? ?1. Malignant neoplasm of overlapping sites in the central and upper outer left breast, IDC, cT2N3M1 stage IV, grade 2-3, ER-/PR- HER2+, MIB-1 of 20% - with nodal and pulmonary metastases ?-She initially self palpated a left breast mass in 06/2020, possibly earlier. Work-up showed 2 masses in the left upper outer breast and 3 abnormal lymph nodes. Biopsy of the 2 masses and 1 lymph node all showed IDC, grade 2-3, ER/PR negative and HER2 positive. ?-PET scan 08/28/20 shows the hypermetabolic known left breast malignancy with hypermetabolic left axillary, retropectoral, and supraclavicular adenopathy.  Additionally there are several small 3-6 mm pulmonary nodules with mild hypermetabolic activity, felt to be consistent with pulmonary metastasis.  ?-baseline CA 27.29 was normal. Baseline echo EF 55-60% ?-She was started on first-line systemic Kadcyla, given once every 3 weeks, on 09/11/20. She tolerates well with some fatigue, dose decreased with C4.  ?-repeat echo 04/28/21 was stable. She is due for repeat this month. ?-CT CAP on 06/07/21 showed significant decrease in the size of lymph node in the left axilla, no other convincing metastasis on scan. ?-She has had good clinical response, breast mass was no longer palpable on physical exam 06/09/21. ?-Labs reviewed, overall stable and adequate for treatment. she is clinically doing well  ?  ?2. Genetics ?-Her sister had metastatic breast cancer in her 32s, and that sister's daughter possibly have breast cancer. ?-Due to family  history and ER/PR negative disease, patient qualifies for genetics. She has not been evaluated or tested yet. ?-Patient's daughter understands her breast cancer risk and the importance of annual mammography. ?  ?3. Spinal stenosis, H/o Fall ?-Reportedly diagnosed in [place], [place].  Patient has chronic back pain and left leg weakness at baseline ?-She now also has right leg weakness and is ambulating with a walker. ?-PET is negative for spinal mets and brain MRI shows subacute lacunar infarct without metastatic disease  ?-she had a fall and hit her head in 01/2021. Work up was negative. ?  ?4. HTN, DM ?-on metformin, amlodipine, and olmesartan ?-f/up PCP Dr. Jeanie Cooks ?-Denies baseline neuropathy ?  ?5. Subacute stroke ?-Her brain MRI showed subacute lacunar infarct of the left basal ganglia, she has no clinical symptoms of stroke. ?-She was seen by Dr. Mickeal Skinner on 09/17/20. He recommended cholesterol medicine and full-dose aspirin. She was prescribed lipitor at that time. ?  ? 6.  Microcytic anemia ?-Hemoglobin around 10, probably related to her underlying cancer ?-We will check iron study and ret count  ? ?PLAN: ?-proceed with C14 Kadcyla with same dose reduction to 50m/kg ?-surveillance echo to be done in next three weeks  ?-lab, and Kadcyla in 3 and 6 weeks ?-f/u in 6 weeks ? ? ?No problem-specific Assessment & Plan notes found for this encounter. ? ? ?SUMMARY OF ONCOLOGIC HISTORY: ?Oncology History Overview Note  ?Cancer Staging ?[place]alignant [place] of upper-outer quadrant of [place] ?Staging form: Breast, AJCC 8th Edition ?- Clinical stage from 07/31/2020: Stage IV (cT2, cN3, cM1, G2, ER-, PR-, HER2+) - Signed by BAlla Feeling  NP on 08/31/2020 ?Stage prefix: Initial diagnosis ?Nuclear grade: G2 ?Histologic grading system: 3 grade system ? ?  ?Malignant neoplasm of upper-outer quadrant of female breast ([place])  ?07/24/2020 Breast US  ? On physical exam, a very firm mass is identified at the 1:30 ?position of  the LEFT breast 5 cm from the nipple. ?  ?IMPRESSION: ?1. Highly suspicious 4.3 cm mass at the 1:30 position of the LEFT ?breast and highly suspicious 2.7 cm mass at the 2 o'clock position ?of the LEFT breast, with the 2 masses encompassing an area measuring at least 6.1 cm. ?2. 3 abnormal LEFT axillary lymph nodes with cortical thickening, ?tissue sampling of 1 of these lymph nodes recommended. ?3. Anterior LEFT breast skin thickening nonspecific but may reflect dermal involvement. ?4. No mammographic evidence of RIGHT breast malignancy. ?  ?07/31/2020 Initial Biopsy  ? Diagnosis ?1. Breast, left, needle core biopsy, 1 :30 pm, 5cmfn ?- INVASIVE DUCTAL CARCINOMA, SEE COMMENT. ?2. Breast, left, needle core biopsy, 2 o'clock, 8cmfn ?- INVASIVE DUCTAL CARCINOMA, SEE COMMENT. ?- DUCTAL CARCINOMA IN SITU. ?3. Lymph node, needle/core biopsy, left axillary ?- METASTATIC CARCINOMA IN A LYMPH NODE. ?Microscopic Comment ?1. and 3. The carcinoma in both parts has a similar morphology and appears grade 2-3. The DCIS in part 2 is high-grade with necrosis. The carcinoma measures 15 mm (part 1) and 14 mm (part 2) in greatest linear extent ? ?2. PROGNOSTIC INDICATORS ?Results: ?IMMUNOHISTOCHEMICAL AND MORPHOMETRIC ANALYSIS PERFORMED MANUALLY ?The tumor cells are POSITIVE for Her2 (3+). ?Estrogen Receptor: 0%, NEGATIVE ?Progesterone Receptor: 0%, NEGATIVE ?Proliferation Marker Ki67: 20% ? ?  ?07/31/2020 Cancer Staging  ? Staging form: Breast, AJCC 8th Edition ?- Clinical stage from 07/31/2020: Stage IV (cT2, cN3, cM1, G2, ER-, PR-, HER2+) - Signed by Alla Feeling, NP on 08/31/2020 ?Stage prefix: Initial diagnosis ?Nuclear grade: G2 ?Histologic grading system: 3 grade system ? ?  ?08/13/2020 Initial Diagnosis  ? Malignant neoplasm of upper-outer quadrant of female breast [facility]) ?  ?08/28/2020 PET scan  ? IMPRESSION: ?Signs of LEFT breast cancer with LEFT axillary and retropectoral ?adenopathy as well as LEFT supraclavicular adenopathy. ?   ?Signs of pulmonary metastatic disease. ?  ?Mild asymmetric uptake in the LEFT as compared to the RIGHT adrenal gland. Equivocal but suspicious, attention on follow-up. ?  ?Aortic atherosclerosis. ?  ?Cystic changes and potential bronchiectasis in the RIGHT lung base likely post infectious or inflammatory. Correlate with any ?respiratory symptoms. Comparison with prior imaging may be helpful with attention on follow-up. ?  ?08/28/2020 Imaging  ? BRAIN MRI IMPRESSION: ?1. Subacute lacunar infarct of the left basal ganglia with no ?malignant hemorrhagic transformation or mass effect. Underlying ?chronic bilateral basal ganglia ischemia. ?2. No metastatic disease or other acute intracranial abnormality ?identified. ?  ?08/28/2020 Imaging  ? Breast MRI ?  ?IMPRESSION: ?1. 8.1 x 8.1 x 5.1 cm area of biopsy-proven malignancy in the ?central, upper outer and lower outer quadrants of the left breast. ?2. Diffuse anterior left breast skin thickening without abnormal ?enhancement. This may represent thickening due to lymphedema ?associated with lymphatic obstruction. ?3. Metastatic level 1 and level 2 left axillary adenopathy. ?4. No evidence of malignancy on the right. ?  ?09/11/2020 -  Chemotherapy  ? Patient is on Treatment Plan : BREAST ADO-Trastuzumab Emtansine (Kadcyla) q21d  ? ?   ?11/30/2020 Imaging  ? CT Chest w/o contrast ? ?IMPRESSION: ?1. Interval response to therapy. The left breast mass has mildly ?decreased in size in the interval. ?2. Decrease in  size of left axillary and left retropectoral lymph ?nodes. ?3. Small right lung nodules stable to improved. ?4. Coronary artery calcifications noted. ?5. Aortic Atherosclerosis (ICD10-I70.0). ?  ?03/04/2021 PET scan  ? IMPRESSION: ?1. Overall marked improvement compared to the prior PET-CT, with resolution of hypermetabolic activity and pathologic enlargement of the previously involved lymph nodes; lack of hypermetabolic activity and nonvisualization of prior right-sided  lung nodules, and substantial reduction in size and activity in the left breast primary. ?2. New ground-glass opacity medially in the right lower lobe with ?low-grade metabolic activity, highly likely to b

## 2021-07-22 LAB — CANCER ANTIGEN 27.29: CA 27.29: 36 U/mL (ref 0.0–38.6)

## 2021-07-23 ENCOUNTER — Telehealth: Payer: Self-pay | Admitting: Hematology

## 2021-07-23 NOTE — Telephone Encounter (Signed)
Scheduled follow-up appointments per 5/17 los. Patient is aware. 

## 2021-08-05 ENCOUNTER — Ambulatory Visit (HOSPITAL_COMMUNITY)
Admission: RE | Admit: 2021-08-05 | Discharge: 2021-08-05 | Disposition: A | Discharge status: Home / Self Care | Payer: Medicare Other | Source: Ambulatory Visit | Attending: Hematology | Admitting: Hematology

## 2021-08-05 DIAGNOSIS — I517 Cardiomegaly: Secondary | ICD-10-CM | POA: Diagnosis not present

## 2021-08-05 DIAGNOSIS — Z01818 Encounter for other preprocedural examination: Secondary | ICD-10-CM | POA: Diagnosis not present

## 2021-08-05 DIAGNOSIS — Z171 Estrogen receptor negative status [ER-]: Secondary | ICD-10-CM | POA: Insufficient documentation

## 2021-08-05 DIAGNOSIS — Z8673 Personal history of transient ischemic attack (TIA), and cerebral infarction without residual deficits: Secondary | ICD-10-CM | POA: Insufficient documentation

## 2021-08-05 DIAGNOSIS — C50412 Malignant neoplasm of upper-outer quadrant of left female breast: Secondary | ICD-10-CM | POA: Diagnosis not present

## 2021-08-05 DIAGNOSIS — Z0189 Encounter for other specified special examinations: Secondary | ICD-10-CM

## 2021-08-05 LAB — ECHOCARDIOGRAM COMPLETE
AR max vel: 2.5 cm2
AV Peak grad: 6.6 mmHg
Ao pk vel: 1.28 m/s
Area-P 1/2: 3 cm2
Calc EF: 58.7 %
S' Lateral: 3 cm
Single Plane A2C EF: 60.2 %
Single Plane A4C EF: 57.5 %

## 2021-08-10 ENCOUNTER — Other Ambulatory Visit: Payer: Self-pay | Admitting: Internal Medicine

## 2021-08-11 ENCOUNTER — Other Ambulatory Visit: Payer: Self-pay

## 2021-08-11 ENCOUNTER — Inpatient Hospital Stay: Payer: Medicare Other

## 2021-08-11 ENCOUNTER — Inpatient Hospital Stay: Payer: Medicare Other | Attending: Nurse Practitioner

## 2021-08-11 VITALS — BP 144/64 | HR 58 | Temp 98.2°F | Wt 161.8 lb

## 2021-08-11 DIAGNOSIS — Z5112 Encounter for antineoplastic immunotherapy: Secondary | ICD-10-CM | POA: Insufficient documentation

## 2021-08-11 DIAGNOSIS — Z171 Estrogen receptor negative status [ER-]: Secondary | ICD-10-CM | POA: Diagnosis not present

## 2021-08-11 DIAGNOSIS — C50812 Malignant neoplasm of overlapping sites of left female breast: Secondary | ICD-10-CM | POA: Insufficient documentation

## 2021-08-11 DIAGNOSIS — N200 Calculus of kidney: Secondary | ICD-10-CM | POA: Insufficient documentation

## 2021-08-11 DIAGNOSIS — Z95828 Presence of other vascular implants and grafts: Secondary | ICD-10-CM

## 2021-08-11 DIAGNOSIS — D649 Anemia, unspecified: Secondary | ICD-10-CM | POA: Diagnosis not present

## 2021-08-11 DIAGNOSIS — M5116 Intervertebral disc disorders with radiculopathy, lumbar region: Secondary | ICD-10-CM | POA: Diagnosis not present

## 2021-08-11 DIAGNOSIS — Z803 Family history of malignant neoplasm of breast: Secondary | ICD-10-CM | POA: Diagnosis not present

## 2021-08-11 DIAGNOSIS — I10 Essential (primary) hypertension: Secondary | ICD-10-CM | POA: Insufficient documentation

## 2021-08-11 DIAGNOSIS — J439 Emphysema, unspecified: Secondary | ICD-10-CM | POA: Diagnosis not present

## 2021-08-11 DIAGNOSIS — M4800 Spinal stenosis, site unspecified: Secondary | ICD-10-CM | POA: Diagnosis not present

## 2021-08-11 DIAGNOSIS — C7800 Secondary malignant neoplasm of unspecified lung: Secondary | ICD-10-CM | POA: Diagnosis not present

## 2021-08-11 DIAGNOSIS — I700 Atherosclerosis of aorta: Secondary | ICD-10-CM | POA: Diagnosis not present

## 2021-08-11 DIAGNOSIS — I2510 Atherosclerotic heart disease of native coronary artery without angina pectoris: Secondary | ICD-10-CM | POA: Insufficient documentation

## 2021-08-11 DIAGNOSIS — Z79899 Other long term (current) drug therapy: Secondary | ICD-10-CM | POA: Insufficient documentation

## 2021-08-11 DIAGNOSIS — G8929 Other chronic pain: Secondary | ICD-10-CM | POA: Insufficient documentation

## 2021-08-11 DIAGNOSIS — I6381 Other cerebral infarction due to occlusion or stenosis of small artery: Secondary | ICD-10-CM | POA: Insufficient documentation

## 2021-08-11 DIAGNOSIS — Z8673 Personal history of transient ischemic attack (TIA), and cerebral infarction without residual deficits: Secondary | ICD-10-CM | POA: Insufficient documentation

## 2021-08-11 DIAGNOSIS — E119 Type 2 diabetes mellitus without complications: Secondary | ICD-10-CM | POA: Diagnosis not present

## 2021-08-11 DIAGNOSIS — M4726 Other spondylosis with radiculopathy, lumbar region: Secondary | ICD-10-CM | POA: Insufficient documentation

## 2021-08-11 LAB — CBC WITH DIFFERENTIAL (CANCER CENTER ONLY)
Abs Immature Granulocytes: 0.01 10*3/uL (ref 0.00–0.07)
Basophils Absolute: 0 10*3/uL (ref 0.0–0.1)
Basophils Relative: 1 %
Eosinophils Absolute: 0.2 10*3/uL (ref 0.0–0.5)
Eosinophils Relative: 3 %
HCT: 30.9 % — ABNORMAL LOW (ref 36.0–46.0)
Hemoglobin: 10.2 g/dL — ABNORMAL LOW (ref 12.0–15.0)
Immature Granulocytes: 0 %
Lymphocytes Relative: 32 %
Lymphs Abs: 1.9 10*3/uL (ref 0.7–4.0)
MCH: 23.2 pg — ABNORMAL LOW (ref 26.0–34.0)
MCHC: 33 g/dL (ref 30.0–36.0)
MCV: 70.4 fL — ABNORMAL LOW (ref 80.0–100.0)
Monocytes Absolute: 0.3 10*3/uL (ref 0.1–1.0)
Monocytes Relative: 5 %
Neutro Abs: 3.4 10*3/uL (ref 1.7–7.7)
Neutrophils Relative %: 59 %
Platelet Count: 215 10*3/uL (ref 150–400)
RBC: 4.39 MIL/uL (ref 3.87–5.11)
RDW: 16.2 % — ABNORMAL HIGH (ref 11.5–15.5)
WBC Count: 5.8 10*3/uL (ref 4.0–10.5)
nRBC: 0 % (ref 0.0–0.2)

## 2021-08-11 LAB — RETIC PANEL
Immature Retic Fract: 16.6 % — ABNORMAL HIGH (ref 2.3–15.9)
RBC.: 4.4 MIL/uL (ref 3.87–5.11)
Retic Count, Absolute: 36.5 10*3/uL (ref 19.0–186.0)
Retic Ct Pct: 0.8 % (ref 0.4–3.1)
Reticulocyte Hemoglobin: 26.9 pg — ABNORMAL LOW (ref >27.9)

## 2021-08-11 LAB — CMP (CANCER CENTER ONLY)
ALT: 19 U/L (ref 0–44)
AST: 35 U/L (ref 15–41)
Albumin: 3.5 g/dL (ref 3.5–5.0)
Alkaline Phosphatase: 76 U/L (ref 38–126)
Anion gap: 8 (ref 5–15)
BUN: 22 mg/dL (ref 8–23)
CO2: 24 mmol/L (ref 22–32)
Calcium: 10.3 mg/dL (ref 8.9–10.3)
Chloride: 110 mmol/L (ref 98–111)
Creatinine: 1.68 mg/dL — ABNORMAL HIGH (ref 0.44–1.00)
GFR, Estimated: 30 mL/min — ABNORMAL LOW (ref >60)
Glucose, Bld: 109 mg/dL — ABNORMAL HIGH (ref 70–99)
Potassium: 3.8 mmol/L (ref 3.5–5.1)
Sodium: 142 mmol/L (ref 135–145)
Total Bilirubin: 0.4 mg/dL (ref 0.3–1.2)
Total Protein: 7.5 g/dL (ref 6.5–8.1)

## 2021-08-11 LAB — FERRITIN: Ferritin: 49 ng/mL (ref 11–307)

## 2021-08-11 LAB — IRON AND IRON BINDING CAPACITY (CC-WL,HP ONLY)
Iron: 44 ug/dL (ref 28–170)
Saturation Ratios: 10 % — ABNORMAL LOW (ref 10.4–31.8)
TIBC: 433 ug/dL (ref 250–450)
UIBC: 389 ug/dL

## 2021-08-11 MED ORDER — SODIUM CHLORIDE 0.9% FLUSH
10.0000 mL | Freq: Once | INTRAVENOUS | Status: AC
  Administered 2021-08-11: 10 mL

## 2021-08-11 MED ORDER — SODIUM CHLORIDE 0.9% FLUSH
10.0000 mL | INTRAVENOUS | Status: DC | PRN
  Administered 2021-08-11: 10 mL

## 2021-08-11 MED ORDER — HEPARIN SOD (PORK) LOCK FLUSH 100 UNIT/ML IV SOLN
500.0000 [IU] | Freq: Once | INTRAVENOUS | Status: AC | PRN
  Administered 2021-08-11: 500 [IU]

## 2021-08-11 MED ORDER — ACETAMINOPHEN 325 MG PO TABS
650.0000 mg | ORAL_TABLET | Freq: Once | ORAL | Status: AC
  Administered 2021-08-11: 650 mg via ORAL
  Filled 2021-08-11: qty 2

## 2021-08-11 MED ORDER — SODIUM CHLORIDE 0.9 % IV SOLN
3.0000 mg/kg | Freq: Once | INTRAVENOUS | Status: AC
  Administered 2021-08-11: 240 mg via INTRAVENOUS
  Filled 2021-08-11: qty 8

## 2021-08-11 MED ORDER — SODIUM CHLORIDE 0.9 % IV SOLN: Freq: Once | INTRAVENOUS | Status: AC

## 2021-08-11 MED ORDER — DIPHENHYDRAMINE HCL 25 MG PO CAPS
50.0000 mg | ORAL_CAPSULE | Freq: Once | ORAL | Status: AC
  Administered 2021-08-11: 50 mg via ORAL
  Filled 2021-08-11: qty 2

## 2021-08-11 NOTE — Progress Notes (Signed)
Patient declined 30 minute post-infusion observation. Vitals stable, ambulatory to lobby.

## 2021-08-11 NOTE — Patient Instructions (Signed)
[facility] ONCOLOGY  Discharge Instructions: Thank you for choosing [facility] to provide your oncology and hematology care.   If you have a lab appointment with the [facility], please go directly to the [facility] and check in at the registration area.   Wear comfortable clothing and clothing appropriate for easy access to any Portacath or PICC line.   We strive to give you quality time with your provider. You may need to reschedule your appointment if you arrive late (15 or more minutes).  Arriving late affects you and other patients whose appointments are after yours.  Also, if you miss three or more appointments without notifying the office, you may be dismissed from the clinic at the provider's discretion.      For prescription refill requests, have your pharmacy contact our office and allow 72 hours for refills to be completed.    Today you received the following chemotherapy and/or immunotherapy agent: Kadcyla      To help prevent nausea and vomiting after your treatment, we encourage you to take your nausea medication as directed.  BELOW ARE SYMPTOMS THAT SHOULD BE REPORTED IMMEDIATELY: *FEVER GREATER THAN 100.4 F (38 C) OR HIGHER *CHILLS OR SWEATING *NAUSEA AND VOMITING THAT IS NOT CONTROLLED WITH YOUR NAUSEA MEDICATION *UNUSUAL SHORTNESS OF BREATH *UNUSUAL BRUISING OR BLEEDING *URINARY PROBLEMS (pain or burning when urinating, or frequent urination) *BOWEL PROBLEMS (unusual diarrhea, constipation, pain near the anus) TENDERNESS IN MOUTH AND THROAT WITH OR WITHOUT PRESENCE OF ULCERS (sore throat, sores in mouth, or a toothache) UNUSUAL RASH, SWELLING OR PAIN  UNUSUAL VAGINAL DISCHARGE OR ITCHING   Items with * indicate a potential emergency and should be followed up as soon as possible or go to the Emergency Department if any problems should occur.  Please show the CHEMOTHERAPY ALERT CARD or IMMUNOTHERAPY ALERT CARD at check-in to the  Emergency Department and triage nurse.  Should you have questions after your visit or need to cancel or reschedule your appointment, please contact [facility]  Dept: [phone]  and follow the prompts.  Office hours are 8:00 a.m. to 4:30 p.m. Monday - Friday. Please note that voicemails left after 4:00 p.m. may not be returned until the following business day.  We are closed weekends and major holidays. You have access to a nurse at all times for urgent questions. Please call the main number to the clinic Dept: [phone] and follow the prompts.   For any non-urgent questions, you may also contact your provider using MyChart. We now offer e-Visits for anyone 65 and older to request care online for non-urgent symptoms. For details visit [email or web address].   Also download the MyChart app! Go to the app store, search "MyChart", open the app, select [facility], and log in with your MyChart username and password.

## 2021-08-12 ENCOUNTER — Ambulatory Visit: Payer: Medicare Other

## 2021-08-12 ENCOUNTER — Other Ambulatory Visit: Payer: Medicare Other

## 2021-08-12 ENCOUNTER — Other Ambulatory Visit: Payer: Self-pay

## 2021-08-12 LAB — CANCER ANTIGEN 27.29: CA 27.29: 41 U/mL — ABNORMAL HIGH (ref 0.0–38.6)

## 2021-08-31 ENCOUNTER — Other Ambulatory Visit: Payer: Self-pay

## 2021-08-31 DIAGNOSIS — Z171 Estrogen receptor negative status [ER-]: Secondary | ICD-10-CM

## 2021-08-31 DIAGNOSIS — C50412 Malignant neoplasm of upper-outer quadrant of left female breast: Secondary | ICD-10-CM

## 2021-09-01 ENCOUNTER — Other Ambulatory Visit: Payer: Self-pay

## 2021-09-01 ENCOUNTER — Inpatient Hospital Stay: Payer: Medicare Other

## 2021-09-01 ENCOUNTER — Inpatient Hospital Stay (HOSPITAL_BASED_OUTPATIENT_CLINIC_OR_DEPARTMENT_OTHER): Payer: Medicare Other | Admitting: Hematology

## 2021-09-01 ENCOUNTER — Encounter: Payer: Self-pay | Admitting: Hematology

## 2021-09-01 VITALS — BP 149/71 | HR 73 | Temp 97.7°F | Resp 18 | Ht 64.0 in | Wt 159.8 lb

## 2021-09-01 DIAGNOSIS — C50812 Malignant neoplasm of overlapping sites of left female breast: Secondary | ICD-10-CM | POA: Diagnosis not present

## 2021-09-01 DIAGNOSIS — C50412 Malignant neoplasm of upper-outer quadrant of left female breast: Secondary | ICD-10-CM

## 2021-09-01 DIAGNOSIS — Z95828 Presence of other vascular implants and grafts: Secondary | ICD-10-CM

## 2021-09-01 DIAGNOSIS — Z171 Estrogen receptor negative status [ER-]: Secondary | ICD-10-CM

## 2021-09-01 LAB — CBC WITH DIFFERENTIAL (CANCER CENTER ONLY)
Abs Immature Granulocytes: 0.01 10*3/uL (ref 0.00–0.07)
Basophils Absolute: 0 10*3/uL (ref 0.0–0.1)
Basophils Relative: 1 %
Eosinophils Absolute: 0.2 10*3/uL (ref 0.0–0.5)
Eosinophils Relative: 5 %
HCT: 29.9 % — ABNORMAL LOW (ref 36.0–46.0)
Hemoglobin: 9.9 g/dL — ABNORMAL LOW (ref 12.0–15.0)
Immature Granulocytes: 0 %
Lymphocytes Relative: 35 %
Lymphs Abs: 1.9 10*3/uL (ref 0.7–4.0)
MCH: 23.2 pg — ABNORMAL LOW (ref 26.0–34.0)
MCHC: 33.1 g/dL (ref 30.0–36.0)
MCV: 70.2 fL — ABNORMAL LOW (ref 80.0–100.0)
Monocytes Absolute: 0.4 10*3/uL (ref 0.1–1.0)
Monocytes Relative: 7 %
Neutro Abs: 2.8 10*3/uL (ref 1.7–7.7)
Neutrophils Relative %: 52 %
Platelet Count: 203 10*3/uL (ref 150–400)
RBC: 4.26 MIL/uL (ref 3.87–5.11)
RDW: 16.5 % — ABNORMAL HIGH (ref 11.5–15.5)
WBC Count: 5.3 10*3/uL (ref 4.0–10.5)
nRBC: 0 % (ref 0.0–0.2)

## 2021-09-01 LAB — CMP (CANCER CENTER ONLY)
ALT: 18 U/L (ref 0–44)
AST: 34 U/L (ref 15–41)
Albumin: 3.6 g/dL (ref 3.5–5.0)
Alkaline Phosphatase: 85 U/L (ref 38–126)
Anion gap: 6 (ref 5–15)
BUN: 20 mg/dL (ref 8–23)
CO2: 28 mmol/L (ref 22–32)
Calcium: 10.3 mg/dL (ref 8.9–10.3)
Chloride: 108 mmol/L (ref 98–111)
Creatinine: 1.73 mg/dL — ABNORMAL HIGH (ref 0.44–1.00)
GFR, Estimated: 29 mL/min — ABNORMAL LOW (ref >60)
Glucose, Bld: 106 mg/dL — ABNORMAL HIGH (ref 70–99)
Potassium: 3.8 mmol/L (ref 3.5–5.1)
Sodium: 142 mmol/L (ref 135–145)
Total Bilirubin: 0.4 mg/dL (ref 0.3–1.2)
Total Protein: 7 g/dL (ref 6.5–8.1)

## 2021-09-01 MED ORDER — ACETAMINOPHEN 325 MG PO TABS
650.0000 mg | ORAL_TABLET | Freq: Once | ORAL | Status: AC
  Administered 2021-09-01: 650 mg via ORAL
  Filled 2021-09-01: qty 2

## 2021-09-01 MED ORDER — DIPHENHYDRAMINE HCL 25 MG PO CAPS
50.0000 mg | ORAL_CAPSULE | Freq: Once | ORAL | Status: AC
  Administered 2021-09-01: 50 mg via ORAL
  Filled 2021-09-01: qty 2

## 2021-09-01 MED ORDER — SODIUM CHLORIDE 0.9 % IV SOLN: Freq: Once | INTRAVENOUS | Status: AC

## 2021-09-01 MED ORDER — SODIUM CHLORIDE 0.9% FLUSH
10.0000 mL | INTRAVENOUS | Status: DC | PRN
  Administered 2021-09-01: 10 mL

## 2021-09-01 MED ORDER — SODIUM CHLORIDE 0.9 % IV SOLN
3.0000 mg/kg | Freq: Once | INTRAVENOUS | Status: AC
  Administered 2021-09-01: 240 mg via INTRAVENOUS
  Filled 2021-09-01: qty 8

## 2021-09-01 MED ORDER — SODIUM CHLORIDE 0.9% FLUSH
10.0000 mL | Freq: Once | INTRAVENOUS | Status: AC
  Administered 2021-09-01: 10 mL

## 2021-09-01 MED ORDER — HEPARIN SOD (PORK) LOCK FLUSH 100 UNIT/ML IV SOLN
500.0000 [IU] | Freq: Once | INTRAVENOUS | Status: AC | PRN
  Administered 2021-09-01: 500 [IU]

## 2021-09-01 NOTE — Progress Notes (Signed)
[facility]   Telephone:([phone] Fax:(336) [phone]   Clinic Follow up Note   Patient Care Team: Nolene Ebbs, MD as PCP - General (Internal Medicine) Truitt Merle, MD as Consulting Physician (Medical Oncology)  Date of Service:  09/01/2021  CHIEF COMPLAINT: f/u of metastatic breast cancer  CURRENT THERAPY:  Kadcyla every 3 weeks, started on 09/11/20  ASSESSMENT & PLAN:  Christy [name] is a 81 y.o. female with   1. Malignant neoplasm of overlapping sites in the central and upper outer left breast, IDC, cT2N3M1 stage IV, grade 2-3, ER-/PR- HER2+, with nodal and pulmonary metastases -She initially self palpated a left breast mass in 06/2020, possibly earlier. Work-up showed 2 masses in the left upper outer breast and 3 abnormal lymph nodes. Biopsy of the 2 masses and 1 lymph node all showed IDC, grade 2-3, ER/PR negative and HER2 positive. -PET scan 08/28/20 showed: hypermetabolic known left breast malignancy with hypermetabolic left axillary, retropectoral, and supraclavicular adenopathy; several small 3-6 mm pulmonary nodules with mild hypermetabolic activity. -baseline CA 27.29 was normal. Baseline echo EF 55-60% -She was started on first-line systemic Kadcyla, given once every 3 weeks, on 09/11/20. She tolerates well with some fatigue, dose decreased with C4.  -CT CAP on 06/07/21 showed significant decrease in the size of lymph node in the left axilla, no other convincing metastasis on scan. -She has had good clinical response, breast mass was no longer palpable on physical exam 06/09/21. -repeat echo 08/05/21 was stable.  -Labs reviewed, overall stable and adequate for treatment. she is clinically doing well. Plan for restaging CT in 6 weeks.   2. Genetics -Her sister had metastatic breast cancer in her 55s, and that sister's daughter possibly have breast cancer. -Due to family history and ER/PR negative disease, patient qualifies for genetics. She has not been evaluated  or tested yet. -Patient's daughter understands her breast cancer risk and the importance of annual mammography.   3. Spinal stenosis, H/o Fall, leg weakness  -Reportedly diagnosed in [place], [place].  Patient has chronic back pain and left leg weakness at baseline -She now also has right leg weakness and is ambulating with a walker. I encourage her to do PT and she wants to wait until she sees orthopedics  -PET is negative for spinal mets and brain MRI shows subacute lacunar infarct without metastatic disease  -she had a fall and hit her head in 01/2021. Work up was negative.   4. HTN, DM -on metformin, amlodipine, and olmesartan -f/up PCP Dr. Jeanie Cooks -Denies baseline neuropathy   5. Subacute stroke -Her brain MRI showed subacute lacunar infarct of the left basal ganglia, she has no clinical symptoms of stroke. -She was seen by Dr. Mickeal Skinner on 09/17/20. He recommended cholesterol medicine and full-dose aspirin. She was prescribed lipitor at that time.   6.  Microcytic anemia -Hemoglobin around 10, probably related to her underlying cancer -We will check iron study and ret count     PLAN: -proceed with C16 Kadcyla with same dose reduction to 42m/kg -lab, and Kadcyla in 3 and 6 weeks -f/u in 6 weeks with restaging CT several days before   No problem-specific Assessment & Plan notes found for this encounter.   SUMMARY OF ONCOLOGIC HISTORY: Oncology History Overview Note  Cancer Staging Malignant neoplasm of upper-outer quadrant of female breast ([facility] Staging form: Breast, AJCC 8th Edition - Clinical stage from 07/31/2020: Stage IV (cT2, cN3, cM1, G2, ER-, PR-, HER2+) - Signed by BAlla Feeling NP  on 08/31/2020 Stage prefix: Initial diagnosis Nuclear grade: G2 Histologic grading system: 3 grade system    Malignant neoplasm of upper-outer quadrant of female breast ([place])  07/24/2020 Breast US   On physical exam, a very firm mass is identified at the 1:30 position of the LEFT breast 5  cm from the nipple.   IMPRESSION: 1. Highly suspicious 4.3 cm mass at the 1:30 position of the LEFT breast and highly suspicious 2.7 cm mass at the 2 o'clock position of the LEFT breast, with the 2 masses encompassing an area measuring at least 6.1 cm. 2. 3 abnormal LEFT axillary lymph nodes with cortical thickening, tissue sampling of 1 of these lymph nodes recommended. 3. Anterior LEFT breast skin thickening nonspecific but may reflect dermal involvement. 4. No mammographic evidence of RIGHT breast malignancy.   07/31/2020 Initial Biopsy   Diagnosis 1. Breast, left, needle core biopsy, 1 :30 pm, 5cmfn - INVASIVE DUCTAL CARCINOMA, SEE COMMENT. 2. Breast, left, needle core biopsy, 2 o'clock, 8cmfn - INVASIVE DUCTAL CARCINOMA, SEE COMMENT. - DUCTAL CARCINOMA IN SITU. 3. Lymph node, needle/core biopsy, left axillary - METASTATIC CARCINOMA IN A LYMPH NODE. Microscopic Comment 1. and 3. The carcinoma in both parts has a similar morphology and appears grade 2-3. The DCIS in part 2 is high-grade with necrosis. The carcinoma measures 15 mm (part 1) and 14 mm (part 2) in greatest linear extent  2. PROGNOSTIC INDICATORS Results: IMMUNOHISTOCHEMICAL AND MORPHOMETRIC ANALYSIS PERFORMED MANUALLY The tumor cells are POSITIVE for Her2 (3+). Estrogen Receptor: 0%, NEGATIVE Progesterone Receptor: 0%, NEGATIVE Proliferation Marker Ki67: 20%    07/31/2020 Cancer Staging   Staging form: Breast, AJCC 8th Edition - Clinical stage from 07/31/2020: Stage IV (cT2, cN3, cM1, G2, ER-, PR-, HER2+) - Signed by Alla Feeling, NP on 08/31/2020 Stage prefix: Initial diagnosis Nuclear grade: G2 Histologic grading system: 3 grade system   08/13/2020 Initial Diagnosis   Malignant neoplasm of upper-outer quadrant of female breast ([place])   08/28/2020 PET scan   IMPRESSION: Signs of LEFT breast cancer with LEFT axillary and retropectoral adenopathy as well as LEFT supraclavicular adenopathy.   Signs of pulmonary  metastatic disease.   Mild asymmetric uptake in the LEFT as compared to the RIGHT adrenal gland. Equivocal but suspicious, attention on follow-up.   Aortic atherosclerosis.   Cystic changes and potential bronchiectasis in the RIGHT lung base likely post infectious or inflammatory. Correlate with any respiratory symptoms. Comparison with prior imaging may be helpful with attention on follow-up.   08/28/2020 Imaging   BRAIN MRI IMPRESSION: 1. Subacute lacunar infarct of the left basal ganglia with no malignant hemorrhagic transformation or mass effect. Underlying chronic bilateral basal ganglia ischemia. 2. No metastatic disease or other acute intracranial abnormality identified.   08/28/2020 Imaging   Breast MRI   IMPRESSION: 1. 8.1 x 8.1 x 5.1 cm area of biopsy-proven malignancy in the central, upper outer and lower outer quadrants of the left breast. 2. Diffuse anterior left breast skin thickening without abnormal enhancement. This may represent thickening due to lymphedema associated with lymphatic obstruction. 3. Metastatic level 1 and level 2 left axillary adenopathy. 4. No evidence of malignancy on the right.   09/11/2020 -  Chemotherapy   Patient is on Treatment Plan : BREAST ADO-Trastuzumab Emtansine (Kadcyla) q21d     11/30/2020 Imaging   CT Chest w/o contrast  IMPRESSION: 1. Interval response to therapy. The left breast mass has mildly decreased in size in the interval. 2. Decrease in size of left  axillary and left retropectoral lymph nodes. 3. Small right lung nodules stable to improved. 4. Coronary artery calcifications noted. 5. Aortic Atherosclerosis (ICD10-I70.0).   03/04/2021 PET scan   IMPRESSION: 1. Overall marked improvement compared to the prior PET-CT, with resolution of hypermetabolic activity and pathologic enlargement of the previously involved lymph nodes; lack of hypermetabolic activity and nonvisualization of prior right-sided lung nodules, and  substantial reduction in size and activity in the left breast primary. 2. New ground-glass opacity medially in the right lower lobe with low-grade metabolic activity, highly likely to be inflammatory (alveolitis) given the morphology and appearance. 3. Other imaging findings of potential clinical significance: Aortic Atherosclerosis (ICD10-I70.0). Coronary atherosclerosis. Mitral valve calcifications. Punctate nonobstructive left renal calculi. Lower lumbar impingement due to chronic spondylosis and degenerative disc disease. Remote left basal ganglia infarct.   06/07/2021 Imaging   EXAM: CT CHEST, ABDOMEN AND PELVIS WITHOUT CONTRAST  IMPRESSION: 1. Scattered tiny bilateral pulmonary nodules are similar to the prior exams, favored to be benign. 2. Otherwise, no evidence of metastatic disease within the chest, abdomen, or pelvis. 3. Right lower lobe ground-glass nodule has resolved since the prior PET 4.  Possible constipation. 5. Coronary artery atherosclerosis. Aortic Atherosclerosis (ICD10-I70.0). Emphysema (ICD10-J43.9). 6. Left nephrolithiasis.      INTERVAL HISTORY:  Christy [name] is here for a follow up of metastatic breast cancer. She was last seen by me on 07/21/21. She presents to the clinic alone. She reports she is doing well overall. She notes some back pain   All other systems were reviewed with the patient and are negative.  MEDICAL HISTORY:  Past Medical History:  Diagnosis Date   Diabetes mellitus without complication ([place])    Hyperlipidemia    Hypertension     SURGICAL HISTORY: Past Surgical History:  Procedure Laterality Date   FOOT FRACTURE SURGERY Right    IR IMAGING GUIDED PORT INSERTION  09/22/2020   IR RADIOLOGIST EVAL & MGMT  09/25/2020    I have reviewed the social history and family history with the patient and they are unchanged from previous note.  ALLERGIES:  has No Known Allergies.  MEDICATIONS:  Current Outpatient Medications   Medication Sig Dispense Refill   amLODipine (NORVASC) 5 MG tablet  (Patient not taking: Reported on 09/17/2020)     aspirin EC 325 MG tablet Take 1 tablet (325 mg total) by mouth daily. 30 tablet 5   atorvastatin (LIPITOR) 20 MG tablet Take 1 tablet by mouth once daily 30 tablet 0   cetirizine (ZYRTEC ALLERGY) 10 MG tablet Take 1 tablet (10 mg total) by mouth daily. (Patient not taking: Reported on 09/17/2020) 30 tablet 0   fluticasone (FLONASE) 50 MCG/ACT nasal spray Place 1 spray into both nostrils daily. (Patient not taking: Reported on 09/17/2020) 1 g 0   lidocaine-prilocaine (EMLA) cream Apply 1 application topically as needed. 30 g 2   metFORMIN (GLUCOPHAGE) 500 MG tablet Take 1 tablet (500 mg total) by mouth 2 (two) times daily with a meal. 60 tablet 1   olmesartan (BENICAR) 40 MG tablet      No current facility-administered medications for this visit.   Facility-Administered Medications Ordered in Other Visits  Medication Dose Route Frequency Provider Last Rate Last Admin   sodium chloride flush (NS) 0.9 % injection 10 mL  10 mL Intracatheter PRN Truitt Merle, MD   10 mL at 09/01/21 1534    PHYSICAL EXAMINATION: ECOG PERFORMANCE STATUS: 1 - Symptomatic but completely ambulatory  Vitals:   09/01/21  1322  BP: (!) 149/71  Pulse: 73  Resp: 18  Temp: 97.7 F (36.5 C)  SpO2: 99%   Wt Readings from Last 3 Encounters:  09/01/21 159 lb 12.8 oz (72.5 kg)  08/11/21 161 lb 12 oz (73.4 kg)  07/21/21 161 lb 9.6 oz (73.3 kg)     GENERAL:alert, no distress and comfortable SKIN: skin color, texture, turgor are normal, no rashes or significant lesions EYES: normal, Conjunctiva are pink and non-injected, sclera clear  NECK: supple, thyroid normal size, non-tender, without nodularity LYMPH:  no palpable lymphadenopathy in the cervical, axillary  LUNGS: clear to auscultation and percussion with normal breathing effort HEART: regular rate & rhythm and no murmurs and no lower extremity  edema ABDOMEN:abdomen soft, non-tender and normal bowel sounds Musculoskeletal:no cyanosis of digits and no clubbing  NEURO: alert & oriented x 3 with fluent speech, no focal motor/sensory deficits BREAST: No palpable mass, nodules or adenopathy bilaterally. Breast exam benign.   LABORATORY DATA:  I have reviewed the data as listed    Latest Ref Rng & Units 09/01/2021   12:53 PM 08/11/2021    1:03 PM 07/21/2021    1:22 PM  CBC  WBC 4.0 - 10.5 K/uL 5.3  5.8  5.9   Hemoglobin 12.0 - 15.0 g/dL 9.9  10.2  10.1   Hematocrit 36.0 - 46.0 % 29.9  30.9  30.3   Platelets 150 - 400 K/uL 203  215  178         Latest Ref Rng & Units 09/01/2021   12:53 PM 08/11/2021    1:03 PM 07/21/2021    1:22 PM  CMP  Glucose 70 - 99 mg/dL 106  109  105   BUN 8 - 23 mg/dL 20  22  17    Creatinine 0.44 - 1.00 mg/dL 1.73  1.68  1.74   Sodium 135 - 145 mmol/L 142  142  144   Potassium 3.5 - 5.1 mmol/L 3.8  3.8  3.7   Chloride 98 - 111 mmol/L 108  110  109   CO2 22 - 32 mmol/L 28  24  29    Calcium 8.9 - 10.3 mg/dL 10.3  10.3  10.0   Total Protein 6.5 - 8.1 g/dL 7.0  7.5  6.8   Total Bilirubin 0.3 - 1.2 mg/dL 0.4  0.4  0.4   Alkaline Phos 38 - 126 U/L 85  76  84   AST 15 - 41 U/L 34  35  31   ALT 0 - 44 U/L 18  19  18        RADIOGRAPHIC STUDIES: I have personally reviewed the radiological images as listed and agreed with the findings in the report. No results found.    Orders Placed This Encounter  Procedures   CT CHEST ABDOMEN PELVIS WO CONTRAST    Standing Status:   Future    Standing Expiration Date:   09/02/2022    Order Specific Question:   If indicated for the ordered procedure, I authorize the administration of contrast media per Radiology protocol    Answer:   Yes    Order Specific Question:   Preferred imaging location?    Answer:   [facility]    Order Specific Question:   Release to patient    Answer:   Immediate    Order Specific Question:   Is Oral Contrast requested for this  exam?    Answer:   Yes, Per Radiology protocol   All  questions were answered. The patient knows to call the clinic with any problems, questions or concerns. No barriers to learning was detected. The total time spent in the appointment was 30 minutes.     Truitt Merle, MD 09/01/2021   I, Wilburn Mylar, am acting as scribe for Truitt Merle, MD.   I have reviewed the above documentation for accuracy and completeness, and I agree with the above.

## 2021-09-01 NOTE — Patient Instructions (Signed)
[facility]  Discharge Instructions: Thank you for choosing [facility] to provide your oncology and hematology care.   If you have a lab appointment with the [facility], please go directly to the [facility] and check in at the registration area.   Wear comfortable clothing and clothing appropriate for easy access to any Portacath or PICC line.   We strive to give you quality time with your provider. You may need to reschedule your appointment if you arrive late (15 or more minutes).  Arriving late affects you and other patients whose appointments are after yours.  Also, if you miss three or more appointments without notifying the office, you may be dismissed from the clinic at the provider's discretion.      For prescription refill requests, have your pharmacy contact our office and allow 72 hours for refills to be completed.    Today you received the following chemotherapy and/or immunotherapy agent: Kadcyla      To help prevent nausea and vomiting after your treatment, we encourage you to take your nausea medication as directed.  BELOW ARE SYMPTOMS THAT SHOULD BE REPORTED IMMEDIATELY: *FEVER GREATER THAN 100.4 F (38 C) OR HIGHER *CHILLS OR SWEATING *NAUSEA AND VOMITING THAT IS NOT CONTROLLED WITH YOUR NAUSEA MEDICATION *UNUSUAL SHORTNESS OF BREATH *UNUSUAL BRUISING OR BLEEDING *URINARY PROBLEMS (pain or burning when urinating, or frequent urination) *BOWEL PROBLEMS (unusual diarrhea, constipation, pain near the anus) TENDERNESS IN MOUTH AND THROAT WITH OR WITHOUT PRESENCE OF ULCERS (sore throat, sores in mouth, or a toothache) UNUSUAL RASH, SWELLING OR PAIN  UNUSUAL VAGINAL DISCHARGE OR ITCHING   Items with * indicate a potential emergency and should be followed up as soon as possible or go to the Emergency Department if any problems should occur.  Please show the CHEMOTHERAPY ALERT CARD or IMMUNOTHERAPY ALERT CARD at check-in to the  Emergency Department and triage nurse.  Should you have questions after your visit or need to cancel or reschedule your appointment, please contact [facility]  Dept: [phone]  and follow the prompts.  Office hours are 8:00 a.m. to 4:30 p.m. Monday - Friday. Please note that voicemails left after 4:00 p.m. may not be returned until the following business day.  We are closed weekends and major holidays. You have access to a nurse at all times for urgent questions. Please call the main number to the clinic Dept: [phone] and follow the prompts.   For any non-urgent questions, you may also contact your provider using MyChart. We now offer e-Visits for anyone 74 and older to request care online for non-urgent symptoms. For details visit [email or web address].   Also download the MyChart app! Go to the app store, search "MyChart", open the app, select [facility], and log in with your MyChart username and password.

## 2021-09-02 LAB — CANCER ANTIGEN 27.29: CA 27.29: 38.7 U/mL — ABNORMAL HIGH (ref 0.0–38.6)

## 2021-09-03 ENCOUNTER — Telehealth: Payer: Self-pay | Admitting: Hematology

## 2021-09-03 ENCOUNTER — Other Ambulatory Visit: Payer: Self-pay | Admitting: Hematology

## 2021-09-03 NOTE — Telephone Encounter (Signed)
Left message with follow-up appointments per 6/28 los.

## 2021-09-14 ENCOUNTER — Other Ambulatory Visit: Payer: Self-pay | Admitting: Internal Medicine

## 2021-09-15 ENCOUNTER — Encounter: Payer: Self-pay | Admitting: Hematology

## 2021-09-22 ENCOUNTER — Inpatient Hospital Stay: Payer: Medicare Other | Attending: Nurse Practitioner

## 2021-09-22 ENCOUNTER — Other Ambulatory Visit: Payer: Self-pay

## 2021-09-22 ENCOUNTER — Inpatient Hospital Stay: Payer: Medicare Other

## 2021-09-22 VITALS — BP 158/69 | HR 68 | Temp 98.2°F | Resp 16 | Wt 158.8 lb

## 2021-09-22 DIAGNOSIS — D649 Anemia, unspecified: Secondary | ICD-10-CM | POA: Insufficient documentation

## 2021-09-22 DIAGNOSIS — C773 Secondary and unspecified malignant neoplasm of axilla and upper limb lymph nodes: Secondary | ICD-10-CM | POA: Diagnosis present

## 2021-09-22 DIAGNOSIS — C50412 Malignant neoplasm of upper-outer quadrant of left female breast: Secondary | ICD-10-CM

## 2021-09-22 DIAGNOSIS — Z5112 Encounter for antineoplastic immunotherapy: Secondary | ICD-10-CM | POA: Insufficient documentation

## 2021-09-22 DIAGNOSIS — C50812 Malignant neoplasm of overlapping sites of left female breast: Secondary | ICD-10-CM | POA: Insufficient documentation

## 2021-09-22 DIAGNOSIS — Z79899 Other long term (current) drug therapy: Secondary | ICD-10-CM | POA: Diagnosis not present

## 2021-09-22 DIAGNOSIS — Z7984 Long term (current) use of oral hypoglycemic drugs: Secondary | ICD-10-CM | POA: Insufficient documentation

## 2021-09-22 DIAGNOSIS — Z171 Estrogen receptor negative status [ER-]: Secondary | ICD-10-CM | POA: Diagnosis not present

## 2021-09-22 DIAGNOSIS — C7800 Secondary malignant neoplasm of unspecified lung: Secondary | ICD-10-CM | POA: Insufficient documentation

## 2021-09-22 DIAGNOSIS — I10 Essential (primary) hypertension: Secondary | ICD-10-CM | POA: Diagnosis not present

## 2021-09-22 DIAGNOSIS — Z7982 Long term (current) use of aspirin: Secondary | ICD-10-CM | POA: Diagnosis not present

## 2021-09-22 DIAGNOSIS — Z8673 Personal history of transient ischemic attack (TIA), and cerebral infarction without residual deficits: Secondary | ICD-10-CM | POA: Insufficient documentation

## 2021-09-22 DIAGNOSIS — E119 Type 2 diabetes mellitus without complications: Secondary | ICD-10-CM | POA: Insufficient documentation

## 2021-09-22 LAB — CMP (CANCER CENTER ONLY)
ALT: 20 U/L (ref 0–44)
AST: 33 U/L (ref 15–41)
Albumin: 3.6 g/dL (ref 3.5–5.0)
Alkaline Phosphatase: 80 U/L (ref 38–126)
Anion gap: 5 (ref 5–15)
BUN: 18 mg/dL (ref 8–23)
CO2: 28 mmol/L (ref 22–32)
Calcium: 10.4 mg/dL — ABNORMAL HIGH (ref 8.9–10.3)
Chloride: 110 mmol/L (ref 98–111)
Creatinine: 1.67 mg/dL — ABNORMAL HIGH (ref 0.44–1.00)
GFR, Estimated: 31 mL/min — ABNORMAL LOW (ref >60)
Glucose, Bld: 104 mg/dL — ABNORMAL HIGH (ref 70–99)
Potassium: 3.9 mmol/L (ref 3.5–5.1)
Sodium: 143 mmol/L (ref 135–145)
Total Bilirubin: 0.5 mg/dL (ref 0.3–1.2)
Total Protein: 6.9 g/dL (ref 6.5–8.1)

## 2021-09-22 LAB — CBC WITH DIFFERENTIAL (CANCER CENTER ONLY)
Abs Immature Granulocytes: 0.01 10*3/uL (ref 0.00–0.07)
Basophils Absolute: 0 10*3/uL (ref 0.0–0.1)
Basophils Relative: 1 %
Eosinophils Absolute: 0.2 10*3/uL (ref 0.0–0.5)
Eosinophils Relative: 4 %
HCT: 29.2 % — ABNORMAL LOW (ref 36.0–46.0)
Hemoglobin: 9.8 g/dL — ABNORMAL LOW (ref 12.0–15.0)
Immature Granulocytes: 0 %
Lymphocytes Relative: 36 %
Lymphs Abs: 1.9 10*3/uL (ref 0.7–4.0)
MCH: 23.6 pg — ABNORMAL LOW (ref 26.0–34.0)
MCHC: 33.6 g/dL (ref 30.0–36.0)
MCV: 70.2 fL — ABNORMAL LOW (ref 80.0–100.0)
Monocytes Absolute: 0.4 10*3/uL (ref 0.1–1.0)
Monocytes Relative: 7 %
Neutro Abs: 2.8 10*3/uL (ref 1.7–7.7)
Neutrophils Relative %: 52 %
Platelet Count: 188 10*3/uL (ref 150–400)
RBC: 4.16 MIL/uL (ref 3.87–5.11)
RDW: 17 % — ABNORMAL HIGH (ref 11.5–15.5)
WBC Count: 5.3 10*3/uL (ref 4.0–10.5)
nRBC: 0 % (ref 0.0–0.2)

## 2021-09-22 MED ORDER — DIPHENHYDRAMINE HCL 25 MG PO CAPS
50.0000 mg | ORAL_CAPSULE | Freq: Once | ORAL | Status: AC
  Administered 2021-09-22: 50 mg via ORAL
  Filled 2021-09-22: qty 2

## 2021-09-22 MED ORDER — ACETAMINOPHEN 325 MG PO TABS
650.0000 mg | ORAL_TABLET | Freq: Once | ORAL | Status: AC
  Administered 2021-09-22: 650 mg via ORAL
  Filled 2021-09-22: qty 2

## 2021-09-22 MED ORDER — SODIUM CHLORIDE 0.9 % IV SOLN
3.0000 mg/kg | Freq: Once | INTRAVENOUS | Status: AC
  Administered 2021-09-22: 240 mg via INTRAVENOUS
  Filled 2021-09-22: qty 8

## 2021-09-22 MED ORDER — HEPARIN SOD (PORK) LOCK FLUSH 100 UNIT/ML IV SOLN
500.0000 [IU] | Freq: Once | INTRAVENOUS | Status: AC | PRN
  Administered 2021-09-22: 500 [IU]

## 2021-09-22 MED ORDER — SODIUM CHLORIDE 0.9% FLUSH
10.0000 mL | INTRAVENOUS | Status: DC | PRN
  Administered 2021-09-22: 10 mL

## 2021-09-22 MED ORDER — SODIUM CHLORIDE 0.9 % IV SOLN: Freq: Once | INTRAVENOUS | Status: AC

## 2021-09-22 NOTE — Patient Instructions (Signed)
[facility]   Discharge Instructions: Thank you for choosing [facility] to provide your oncology and hematology care.   If you have a lab appointment with the [facility], please go directly to the [facility] and check in at the registration area.   Wear comfortable clothing and clothing appropriate for easy access to any Portacath or PICC line.   We strive to give you quality time with your provider. You may need to reschedule your appointment if you arrive late (15 or more minutes).  Arriving late affects you and other patients whose appointments are after yours.  Also, if you miss three or more appointments without notifying the office, you may be dismissed from the clinic at the provider's discretion.      For prescription refill requests, have your pharmacy contact our office and allow 72 hours for refills to be completed.    Today you received the following chemotherapy and/or immunotherapy agents: ado-trastuzumab emtansine      To help prevent nausea and vomiting after your treatment, we encourage you to take your nausea medication as directed.  BELOW ARE SYMPTOMS THAT SHOULD BE REPORTED IMMEDIATELY: *FEVER GREATER THAN 100.4 F (38 C) OR HIGHER *CHILLS OR SWEATING *NAUSEA AND VOMITING THAT IS NOT CONTROLLED WITH YOUR NAUSEA MEDICATION *UNUSUAL SHORTNESS OF BREATH *UNUSUAL BRUISING OR BLEEDING *URINARY PROBLEMS (pain or burning when urinating, or frequent urination) *BOWEL PROBLEMS (unusual diarrhea, constipation, pain near the anus) TENDERNESS IN MOUTH AND THROAT WITH OR WITHOUT PRESENCE OF ULCERS (sore throat, sores in mouth, or a toothache) UNUSUAL RASH, SWELLING OR PAIN  UNUSUAL VAGINAL DISCHARGE OR ITCHING   Items with * indicate a potential emergency and should be followed up as soon as possible or go to the Emergency Department if any problems should occur.  Please show the CHEMOTHERAPY ALERT CARD or IMMUNOTHERAPY ALERT  CARD at check-in to the Emergency Department and triage nurse.  Should you have questions after your visit or need to cancel or reschedule your appointment, please contact [facility]  Dept: [phone]  and follow the prompts.  Office hours are 8:00 a.m. to 4:30 p.m. Monday - Friday. Please note that voicemails left after 4:00 p.m. may not be returned until the following business day.  We are closed weekends and major holidays. You have access to a nurse at all times for urgent questions. Please call the main number to the clinic Dept: [phone] and follow the prompts.   For any non-urgent questions, you may also contact your provider using MyChart. We now offer e-Visits for anyone 15 and older to request care online for non-urgent symptoms. For details visit [email or web address].   Also download the MyChart app! Go to the app store, search "MyChart", open the app, select [facility], and log in with your MyChart username and password.  Masks are optional in the cancer centers. If you would like for your care team to wear a mask while they are taking care of you, please let them know. For doctor visits, patients may have with them one support person who is at least 81 years old. At this time, visitors are not allowed in the infusion area.

## 2021-09-22 NOTE — Progress Notes (Signed)
Calculated dose of kadcyla right outside of 10%. Will continue 240 mg for now and evaluate next cycle.  Larene Beach, PharmD

## 2021-09-23 LAB — CANCER ANTIGEN 27.29: CA 27.29: 31.9 U/mL (ref 0.0–38.6)

## 2021-09-27 ENCOUNTER — Other Ambulatory Visit: Payer: Self-pay

## 2021-09-28 ENCOUNTER — Other Ambulatory Visit: Payer: Self-pay

## 2021-10-05 ENCOUNTER — Other Ambulatory Visit: Payer: Self-pay

## 2021-10-07 ENCOUNTER — Ambulatory Visit (HOSPITAL_COMMUNITY)
Admission: RE | Admit: 2021-10-07 | Discharge: 2021-10-07 | Disposition: A | Discharge status: Home / Self Care | Payer: Medicare Other | Source: Ambulatory Visit | Attending: Hematology | Admitting: Hematology

## 2021-10-07 ENCOUNTER — Inpatient Hospital Stay: Payer: Medicare Other | Attending: Nurse Practitioner

## 2021-10-07 DIAGNOSIS — J439 Emphysema, unspecified: Secondary | ICD-10-CM | POA: Insufficient documentation

## 2021-10-07 DIAGNOSIS — D509 Iron deficiency anemia, unspecified: Secondary | ICD-10-CM | POA: Insufficient documentation

## 2021-10-07 DIAGNOSIS — Z171 Estrogen receptor negative status [ER-]: Secondary | ICD-10-CM | POA: Insufficient documentation

## 2021-10-07 DIAGNOSIS — Z8673 Personal history of transient ischemic attack (TIA), and cerebral infarction without residual deficits: Secondary | ICD-10-CM | POA: Insufficient documentation

## 2021-10-07 DIAGNOSIS — Z79899 Other long term (current) drug therapy: Secondary | ICD-10-CM | POA: Insufficient documentation

## 2021-10-07 DIAGNOSIS — M4800 Spinal stenosis, site unspecified: Secondary | ICD-10-CM | POA: Insufficient documentation

## 2021-10-07 DIAGNOSIS — I6381 Other cerebral infarction due to occlusion or stenosis of small artery: Secondary | ICD-10-CM | POA: Insufficient documentation

## 2021-10-07 DIAGNOSIS — C50812 Malignant neoplasm of overlapping sites of left female breast: Secondary | ICD-10-CM | POA: Insufficient documentation

## 2021-10-07 DIAGNOSIS — C50412 Malignant neoplasm of upper-outer quadrant of left female breast: Secondary | ICD-10-CM | POA: Diagnosis present

## 2021-10-07 DIAGNOSIS — C7800 Secondary malignant neoplasm of unspecified lung: Secondary | ICD-10-CM | POA: Insufficient documentation

## 2021-10-07 DIAGNOSIS — Z803 Family history of malignant neoplasm of breast: Secondary | ICD-10-CM | POA: Insufficient documentation

## 2021-10-07 DIAGNOSIS — C773 Secondary and unspecified malignant neoplasm of axilla and upper limb lymph nodes: Secondary | ICD-10-CM | POA: Insufficient documentation

## 2021-10-07 DIAGNOSIS — E785 Hyperlipidemia, unspecified: Secondary | ICD-10-CM | POA: Insufficient documentation

## 2021-10-07 DIAGNOSIS — I700 Atherosclerosis of aorta: Secondary | ICD-10-CM | POA: Insufficient documentation

## 2021-10-07 DIAGNOSIS — E119 Type 2 diabetes mellitus without complications: Secondary | ICD-10-CM | POA: Insufficient documentation

## 2021-10-07 DIAGNOSIS — Z5112 Encounter for antineoplastic immunotherapy: Secondary | ICD-10-CM | POA: Insufficient documentation

## 2021-10-07 DIAGNOSIS — I2510 Atherosclerotic heart disease of native coronary artery without angina pectoris: Secondary | ICD-10-CM | POA: Insufficient documentation

## 2021-10-07 DIAGNOSIS — I10 Essential (primary) hypertension: Secondary | ICD-10-CM | POA: Insufficient documentation

## 2021-10-07 DIAGNOSIS — G8929 Other chronic pain: Secondary | ICD-10-CM | POA: Insufficient documentation

## 2021-10-07 DIAGNOSIS — N200 Calculus of kidney: Secondary | ICD-10-CM | POA: Insufficient documentation

## 2021-10-07 MED ORDER — IOHEXOL 9 MG/ML PO SOLN
1000.0000 mL | Freq: Once | ORAL | Status: AC
  Administered 2021-10-07: 1000 mL via ORAL

## 2021-10-11 ENCOUNTER — Other Ambulatory Visit: Payer: Self-pay

## 2021-10-13 ENCOUNTER — Inpatient Hospital Stay: Payer: Medicare Other

## 2021-10-13 ENCOUNTER — Inpatient Hospital Stay (HOSPITAL_BASED_OUTPATIENT_CLINIC_OR_DEPARTMENT_OTHER): Payer: Medicare Other | Admitting: Hematology

## 2021-10-13 ENCOUNTER — Other Ambulatory Visit: Payer: Self-pay

## 2021-10-13 VITALS — BP 150/72 | HR 68 | Temp 98.4°F | Wt 153.8 lb

## 2021-10-13 VITALS — BP 153/78 | HR 62 | Temp 98.2°F | Resp 16

## 2021-10-13 DIAGNOSIS — C50412 Malignant neoplasm of upper-outer quadrant of left female breast: Secondary | ICD-10-CM

## 2021-10-13 DIAGNOSIS — I2510 Atherosclerotic heart disease of native coronary artery without angina pectoris: Secondary | ICD-10-CM | POA: Diagnosis not present

## 2021-10-13 DIAGNOSIS — Z171 Estrogen receptor negative status [ER-]: Secondary | ICD-10-CM

## 2021-10-13 DIAGNOSIS — D509 Iron deficiency anemia, unspecified: Secondary | ICD-10-CM | POA: Diagnosis not present

## 2021-10-13 DIAGNOSIS — J439 Emphysema, unspecified: Secondary | ICD-10-CM | POA: Diagnosis not present

## 2021-10-13 DIAGNOSIS — Z8673 Personal history of transient ischemic attack (TIA), and cerebral infarction without residual deficits: Secondary | ICD-10-CM | POA: Diagnosis not present

## 2021-10-13 DIAGNOSIS — I6381 Other cerebral infarction due to occlusion or stenosis of small artery: Secondary | ICD-10-CM | POA: Diagnosis not present

## 2021-10-13 DIAGNOSIS — M4800 Spinal stenosis, site unspecified: Secondary | ICD-10-CM | POA: Diagnosis not present

## 2021-10-13 DIAGNOSIS — Z95828 Presence of other vascular implants and grafts: Secondary | ICD-10-CM

## 2021-10-13 DIAGNOSIS — E119 Type 2 diabetes mellitus without complications: Secondary | ICD-10-CM | POA: Diagnosis not present

## 2021-10-13 DIAGNOSIS — I10 Essential (primary) hypertension: Secondary | ICD-10-CM | POA: Diagnosis not present

## 2021-10-13 DIAGNOSIS — E785 Hyperlipidemia, unspecified: Secondary | ICD-10-CM | POA: Diagnosis not present

## 2021-10-13 DIAGNOSIS — C7800 Secondary malignant neoplasm of unspecified lung: Secondary | ICD-10-CM | POA: Diagnosis not present

## 2021-10-13 DIAGNOSIS — Z79899 Other long term (current) drug therapy: Secondary | ICD-10-CM | POA: Diagnosis not present

## 2021-10-13 DIAGNOSIS — Z5112 Encounter for antineoplastic immunotherapy: Secondary | ICD-10-CM | POA: Diagnosis present

## 2021-10-13 DIAGNOSIS — C50812 Malignant neoplasm of overlapping sites of left female breast: Secondary | ICD-10-CM | POA: Diagnosis present

## 2021-10-13 DIAGNOSIS — G8929 Other chronic pain: Secondary | ICD-10-CM | POA: Diagnosis not present

## 2021-10-13 DIAGNOSIS — N200 Calculus of kidney: Secondary | ICD-10-CM | POA: Diagnosis not present

## 2021-10-13 DIAGNOSIS — Z803 Family history of malignant neoplasm of breast: Secondary | ICD-10-CM | POA: Diagnosis not present

## 2021-10-13 DIAGNOSIS — I700 Atherosclerosis of aorta: Secondary | ICD-10-CM | POA: Diagnosis not present

## 2021-10-13 DIAGNOSIS — C773 Secondary and unspecified malignant neoplasm of axilla and upper limb lymph nodes: Secondary | ICD-10-CM | POA: Diagnosis present

## 2021-10-13 LAB — CMP (CANCER CENTER ONLY)
ALT: 19 U/L (ref 0–44)
AST: 37 U/L (ref 15–41)
Albumin: 3.8 g/dL (ref 3.5–5.0)
Alkaline Phosphatase: 85 U/L (ref 38–126)
Anion gap: 5 (ref 5–15)
BUN: 21 mg/dL (ref 8–23)
CO2: 29 mmol/L (ref 22–32)
Calcium: 10.3 mg/dL (ref 8.9–10.3)
Chloride: 109 mmol/L (ref 98–111)
Creatinine: 1.8 mg/dL — ABNORMAL HIGH (ref 0.44–1.00)
GFR, Estimated: 28 mL/min — ABNORMAL LOW (ref >60)
Glucose, Bld: 105 mg/dL — ABNORMAL HIGH (ref 70–99)
Potassium: 3.8 mmol/L (ref 3.5–5.1)
Sodium: 143 mmol/L (ref 135–145)
Total Bilirubin: 0.6 mg/dL (ref 0.3–1.2)
Total Protein: 7.4 g/dL (ref 6.5–8.1)

## 2021-10-13 LAB — CBC WITH DIFFERENTIAL (CANCER CENTER ONLY)
Abs Immature Granulocytes: 0.01 10*3/uL (ref 0.00–0.07)
Basophils Absolute: 0 10*3/uL (ref 0.0–0.1)
Basophils Relative: 1 %
Eosinophils Absolute: 0.2 10*3/uL (ref 0.0–0.5)
Eosinophils Relative: 5 %
HCT: 30.7 % — ABNORMAL LOW (ref 36.0–46.0)
Hemoglobin: 10.4 g/dL — ABNORMAL LOW (ref 12.0–15.0)
Immature Granulocytes: 0 %
Lymphocytes Relative: 39 %
Lymphs Abs: 1.8 10*3/uL (ref 0.7–4.0)
MCH: 23.6 pg — ABNORMAL LOW (ref 26.0–34.0)
MCHC: 33.9 g/dL (ref 30.0–36.0)
MCV: 69.8 fL — ABNORMAL LOW (ref 80.0–100.0)
Monocytes Absolute: 0.3 10*3/uL (ref 0.1–1.0)
Monocytes Relative: 7 %
Neutro Abs: 2.3 10*3/uL (ref 1.7–7.7)
Neutrophils Relative %: 48 %
Platelet Count: 198 10*3/uL (ref 150–400)
RBC: 4.4 MIL/uL (ref 3.87–5.11)
RDW: 17.2 % — ABNORMAL HIGH (ref 11.5–15.5)
WBC Count: 4.7 10*3/uL (ref 4.0–10.5)
nRBC: 0 % (ref 0.0–0.2)

## 2021-10-13 MED ORDER — ACETAMINOPHEN 325 MG PO TABS
ORAL_TABLET | ORAL | Status: AC
  Filled 2021-10-13: qty 2

## 2021-10-13 MED ORDER — ACETAMINOPHEN 325 MG PO TABS
650.0000 mg | ORAL_TABLET | Freq: Once | ORAL | Status: AC
  Administered 2021-10-13: 650 mg via ORAL

## 2021-10-13 MED ORDER — SODIUM CHLORIDE 0.9% FLUSH
10.0000 mL | Freq: Once | INTRAVENOUS | Status: AC
  Administered 2021-10-13: 10 mL

## 2021-10-13 MED ORDER — HEPARIN SOD (PORK) LOCK FLUSH 100 UNIT/ML IV SOLN
500.0000 [IU] | Freq: Once | INTRAVENOUS | Status: AC | PRN
  Administered 2021-10-13: 500 [IU]

## 2021-10-13 MED ORDER — SODIUM CHLORIDE 0.9% FLUSH
10.0000 mL | INTRAVENOUS | Status: DC | PRN
  Administered 2021-10-13: 10 mL

## 2021-10-13 MED ORDER — DIPHENHYDRAMINE HCL 25 MG PO CAPS
50.0000 mg | ORAL_CAPSULE | Freq: Once | ORAL | Status: AC
  Administered 2021-10-13: 50 mg via ORAL

## 2021-10-13 MED ORDER — SODIUM CHLORIDE 0.9 % IV SOLN
200.0000 mg | Freq: Once | INTRAVENOUS | Status: AC
  Administered 2021-10-13: 200 mg via INTRAVENOUS
  Filled 2021-10-13: qty 10

## 2021-10-13 MED ORDER — SODIUM CHLORIDE 0.9 % IV SOLN: Freq: Once | INTRAVENOUS | Status: AC

## 2021-10-13 MED ORDER — DIPHENHYDRAMINE HCL 25 MG PO CAPS
ORAL_CAPSULE | ORAL | Status: AC
  Filled 2021-10-13: qty 2

## 2021-10-13 NOTE — Progress Notes (Signed)
[facility]   [phone] [phone]   Clinic Follow up Note   Patient Care Team: Nolene Ebbs, MD as PCP - General (Internal Medicine) Truitt Merle, MD as Consulting Physician (Medical Oncology)  Date of Service:  10/13/2021  CHIEF COMPLAINT: f/u of metastatic breast cancer  CURRENT THERAPY:  Kadcyla every 3 weeks, started on 09/11/20  ASSESSMENT & PLAN:  Christy [name] is a 81 y.o. female with   1. Malignant neoplasm of overlapping sites in the central and upper outer left breast, IDC, cT2N3M1 stage IV, grade 2-3, ER-/PR- HER2+, with nodal and pulmonary metastases -She initially self palpated a left breast mass in 06/2020, possibly earlier. Work-up showed 2 masses in the left upper outer breast and 3 abnormal lymph nodes. Biopsy of the 2 masses and 1 lymph node all showed IDC, grade 2-3, ER/PR negative and HER2 positive. -PET scan 08/28/20 showed: hypermetabolic known left breast malignancy with hypermetabolic left axillary, retropectoral, and supraclavicular adenopathy; several small 3-6 mm pulmonary nodules with mild hypermetabolic activity. -baseline CA 27.29 was normal. Baseline echo EF 55-60% -She was started on first-line systemic Kadcyla, given once every 3 weeks, on 09/11/20. She tolerates well with some fatigue, dose decreased with C4.  -She has had good clinical response, breast mass was no longer palpable on physical exam 06/09/21. -repeat echo 08/05/21 was stable.  -restaging CT CAP 10/07/21 was stable, NED. I reviewed the results with her today. -she is clinically doing well and continues to tolerate treatment. Labs reviewed, overall stable and adequate for treatment.    2. Spinal stenosis, H/o Fall, leg weakness  -Reportedly diagnosed in [place], [place].  Patient has chronic back pain and left leg weakness at baseline -She now also has right leg weakness and is ambulating with a walker. She plans to start PT. -CT CAP on 10/07/21 showed no bone  lesions.  3. Genetics -Her sister had metastatic breast cancer in her 4s, and that sister's daughter possibly have breast cancer. -Due to family history and ER/PR negative disease, patient qualifies for genetics. She has not been evaluated or tested yet. -Patient's daughter understands her breast cancer risk and the importance of annual mammography.   4. HTN, DM -on metformin, amlodipine, and olmesartan -f/up PCP Dr. Jeanie Cooks -Denies baseline neuropathy   5. Subacute stroke -Her brain MRI showed subacute lacunar infarct of the left basal ganglia, she has no clinical symptoms of stroke. -She was seen by Dr. Mickeal Skinner on 09/17/20. He recommended cholesterol medicine and full-dose aspirin. She was prescribed lipitor at that time.   6.  Microcytic anemia -Hemoglobin around 10, probably related to her underlying cancer -iron and ferritin were low but WNL on 08/11/21.     PLAN: -proceed with C18 Kadcyla with same dose reduction to 5m/kg -lab, and Kadcyla in 3 and 6 weeks -f/u in 6 weeks   No problem-specific Assessment & Plan notes found for this encounter.   SUMMARY OF ONCOLOGIC HISTORY: Oncology History Overview Note  Cancer Staging Malignant neoplasm of upper-outer quadrant of female breast ([facility] Staging form: Breast, AJCC 8th Edition - Clinical stage from 07/31/2020: Stage IV (cT2, cN3, cM1, G2, ER-, PR-, HER2+) - Signed by BAlla Feeling NP on 08/31/2020 Stage prefix: Initial diagnosis Nuclear grade: G2 Histologic grading system: 3 grade system    Malignant neoplasm of upper-outer quadrant of female breast ([facility]  07/24/2020 Breast [place]  On physical exam, a very firm mass is identified at the 1:30 position of the LEFT breast  5 cm from the nipple.   IMPRESSION: 1. Highly suspicious 4.3 cm mass at the 1:30 position of the LEFT breast and highly suspicious 2.7 cm mass at the 2 o'clock position of the LEFT breast, with the 2 masses encompassing an area measuring at least 6.1  cm. 2. 3 abnormal LEFT axillary lymph nodes with cortical thickening, tissue sampling of 1 of these lymph nodes recommended. 3. Anterior LEFT breast skin thickening nonspecific but may reflect dermal involvement. 4. No mammographic evidence of RIGHT breast malignancy.   07/31/2020 Initial Biopsy   Diagnosis 1. Breast, left, needle core biopsy, 1 :30 pm, 5cmfn - INVASIVE DUCTAL CARCINOMA, SEE COMMENT. 2. Breast, left, needle core biopsy, 2 o'clock, 8cmfn - INVASIVE DUCTAL CARCINOMA, SEE COMMENT. - DUCTAL CARCINOMA IN SITU. 3. Lymph node, needle/core biopsy, left axillary - METASTATIC CARCINOMA IN A LYMPH NODE. Microscopic Comment 1. and 3. The carcinoma in both parts has a similar morphology and appears grade 2-3. The DCIS in part 2 is high-grade with necrosis. The carcinoma measures 15 mm (part 1) and 14 mm (part 2) in greatest linear extent  2. PROGNOSTIC INDICATORS Results: IMMUNOHISTOCHEMICAL AND MORPHOMETRIC ANALYSIS PERFORMED MANUALLY The tumor cells are POSITIVE for Her2 (3+). Estrogen Receptor: 0%, NEGATIVE Progesterone Receptor: 0%, NEGATIVE Proliferation Marker Ki67: 20%    07/31/2020 Cancer Staging   Staging form: Breast, AJCC 8th Edition - Clinical stage from 07/31/2020: Stage IV (cT2, cN3, cM1, G2, ER-, PR-, HER2+) - Signed by Alla Feeling, NP on 08/31/2020 Stage prefix: Initial diagnosis Nuclear grade: G2 Histologic grading system: 3 grade system   08/13/2020 Initial Diagnosis   Malignant neoplasm of upper-outer quadrant of female breast ([facility])   08/28/2020 PET scan   IMPRESSION: Signs of LEFT breast cancer with LEFT axillary and retropectoral adenopathy as well as LEFT supraclavicular adenopathy.   Signs of pulmonary metastatic disease.   Mild asymmetric uptake in the LEFT as compared to the RIGHT adrenal gland. Equivocal but suspicious, attention on follow-up.   Aortic atherosclerosis.   Cystic changes and potential bronchiectasis in the RIGHT lung base  likely post infectious or inflammatory. Correlate with any respiratory symptoms. Comparison with prior imaging may be helpful with attention on follow-up.   08/28/2020 Imaging   BRAIN MRI IMPRESSION: 1. Subacute lacunar infarct of the left basal ganglia with no malignant hemorrhagic transformation or mass effect. Underlying chronic bilateral basal ganglia ischemia. 2. No metastatic disease or other acute intracranial abnormality identified.   08/28/2020 Imaging   Breast MRI   IMPRESSION: 1. 8.1 x 8.1 x 5.1 cm area of biopsy-proven malignancy in the central, upper outer and lower outer quadrants of the left breast. 2. Diffuse anterior left breast skin thickening without abnormal enhancement. This may represent thickening due to lymphedema associated with lymphatic obstruction. 3. Metastatic level 1 and level 2 left axillary adenopathy. 4. No evidence of malignancy on the right.   09/11/2020 -  Chemotherapy   Patient is on Treatment Plan : BREAST ADO-Trastuzumab Emtansine (Kadcyla) q21d     11/30/2020 Imaging   CT Chest w/o contrast  IMPRESSION: 1. Interval response to therapy. The left breast mass has mildly decreased in size in the interval. 2. Decrease in size of left axillary and left retropectoral lymph nodes. 3. Small right lung nodules stable to improved. 4. Coronary artery calcifications noted. 5. Aortic Atherosclerosis (ICD10-I70.0).   03/04/2021 PET scan   IMPRESSION: 1. Overall marked improvement compared to the prior PET-CT, with resolution of hypermetabolic activity and pathologic enlargement of the  previously involved lymph nodes; lack of hypermetabolic activity and nonvisualization of prior right-sided lung nodules, and substantial reduction in size and activity in the left breast primary. 2. New ground-glass opacity medially in the right lower lobe with low-grade metabolic activity, highly likely to be inflammatory (alveolitis) given the morphology and  appearance. 3. Other imaging findings of potential clinical significance: Aortic Atherosclerosis (ICD10-I70.0). Coronary atherosclerosis. Mitral valve calcifications. Punctate nonobstructive left renal calculi. Lower lumbar impingement due to chronic spondylosis and degenerative disc disease. Remote left basal ganglia infarct.   06/07/2021 Imaging   EXAM: CT CHEST, ABDOMEN AND PELVIS WITHOUT CONTRAST  IMPRESSION: 1. Scattered tiny bilateral pulmonary nodules are similar to the prior exams, favored to be benign. 2. Otherwise, no evidence of metastatic disease within the chest, abdomen, or pelvis. 3. Right lower lobe ground-glass nodule has resolved since the prior PET 4.  Possible constipation. 5. Coronary artery atherosclerosis. Aortic Atherosclerosis (ICD10-I70.0). Emphysema (ICD10-J43.9). 6. Left nephrolithiasis.      INTERVAL HISTORY:  PADEN KURAS is here for a follow up of metastatic breast cancer. She was last seen by me on 09/01/21. She presents to the clinic alone. She reports she is going to start physical therapy for the weakness in her legs. She reports her hands are "always ice cold."   All other systems were reviewed with the patient and are negative.  MEDICAL HISTORY:  Past Medical History:  Diagnosis Date   Diabetes mellitus without complication ([facility])    Hyperlipidemia    Hypertension     SURGICAL HISTORY: Past Surgical History:  Procedure Laterality Date   FOOT FRACTURE SURGERY Right    IR IMAGING GUIDED PORT INSERTION  09/22/2020   IR RADIOLOGIST EVAL & MGMT  09/25/2020    I have reviewed the social history and family history with the patient and they are unchanged from previous note.  ALLERGIES:  has No Known Allergies.  MEDICATIONS:  Current Outpatient Medications  Medication Sig Dispense Refill   amLODipine (NORVASC) 5 MG tablet  (Patient not taking: Reported on 09/17/2020)     aspirin EC 325 MG tablet Take 1 tablet (325 mg total) by mouth  daily. 30 tablet 5   atorvastatin (LIPITOR) 20 MG tablet Take 1 tablet by mouth once daily 30 tablet 0   cetirizine (ZYRTEC ALLERGY) 10 MG tablet Take 1 tablet (10 mg total) by mouth daily. (Patient not taking: Reported on 09/17/2020) 30 tablet 0   fluticasone (FLONASE) 50 MCG/ACT nasal spray Place 1 spray into both nostrils daily. (Patient not taking: Reported on 09/17/2020) 1 g 0   lidocaine-prilocaine (EMLA) cream Apply 1 application topically as needed. 30 g 2   metFORMIN (GLUCOPHAGE) 500 MG tablet Take 1 tablet (500 mg total) by mouth 2 (two) times daily with a meal. 60 tablet 1   olmesartan (BENICAR) 40 MG tablet      No current facility-administered medications for this visit.    PHYSICAL EXAMINATION: ECOG PERFORMANCE STATUS: {CHL ONC ECOG PS:[id number]}  There were no vitals filed for this visit. Wt Readings from Last 3 Encounters:  09/22/21 158 lb 12 oz (72 kg)  09/01/21 159 lb 12.8 oz (72.5 kg)  08/11/21 161 lb 12 oz (73.4 kg)     GENERAL:alert, no distress and comfortable SKIN: skin color, texture, turgor are normal, no rashes or significant lesions EYES: normal, Conjunctiva are pink and non-injected, sclera clear  LYMPH:  no palpable lymphadenopathy in the cervical, axillary  Musculoskeletal:no cyanosis of digits and no clubbing  NEURO: alert &  oriented x 3 with fluent speech, no focal motor/sensory deficits  LABORATORY DATA:  I have reviewed the data as listed    Latest Ref Rng & Units 10/13/2021   12:15 PM 09/22/2021    1:37 PM 09/01/2021   12:53 PM  CBC  WBC 4.0 - 10.5 K/uL 4.7  5.3  5.3   Hemoglobin 12.0 - 15.0 g/dL 10.4  9.8  9.9   Hematocrit 36.0 - 46.0 % 30.7  29.2  29.9   Platelets 150 - 400 K/uL 198  188  203         Latest Ref Rng & Units 09/22/2021    1:37 PM 09/01/2021   12:53 PM 08/11/2021    1:03 PM  CMP  Glucose 70 - 99 mg/dL 104  106  109   BUN 8 - 23 mg/dL 18  20  22    Creatinine 0.44 - 1.00 mg/dL 1.67  1.73  1.68   Sodium 135 - 145 mmol/L 143   142  142   Potassium 3.5 - 5.1 mmol/L 3.9  3.8  3.8   Chloride 98 - 111 mmol/L 110  108  110   CO2 22 - 32 mmol/L 28  28  24    Calcium 8.9 - 10.3 mg/dL 10.4  10.3  10.3   Total Protein 6.5 - 8.1 g/dL 6.9  7.0  7.5   Total Bilirubin 0.3 - 1.2 mg/dL 0.5  0.4  0.4   Alkaline Phos 38 - 126 U/L 80  85  76   AST 15 - 41 U/L 33  34  35   ALT 0 - 44 U/L 20  18  19        RADIOGRAPHIC STUDIES: I have personally reviewed the radiological images as listed and agreed with the findings in the report. No results found.    No orders of the defined types were placed in this encounter.  All questions were answered. The patient knows to call the clinic with any problems, questions or concerns. No barriers to learning was detected. The total time spent in the appointment was {CHL ONC TIME VISIT - [id number]}.     Aurea Graff 10/13/2021   I, Wilburn Mylar, am acting as scribe for Truitt Merle, MD.   {Add scribe attestation statement}

## 2021-10-13 NOTE — Patient Instructions (Signed)
[facility] ONCOLOGY  Discharge Instructions: Thank you for choosing [facility] to provide your oncology and hematology care.   If you have a lab appointment with the [facility], please go directly to the [facility] and check in at the registration area.   Wear comfortable clothing and clothing appropriate for easy access to any Portacath or PICC line.   We strive to give you quality time with your provider. You may need to reschedule your appointment if you arrive late (15 or more minutes).  Arriving late affects you and other patients whose appointments are after yours.  Also, if you miss three or more appointments without notifying the office, you may be dismissed from the clinic at the provider's discretion.      For prescription refill requests, have your pharmacy contact our office and allow 72 hours for refills to be completed.    Today you received the following chemotherapy and/or immunotherapy agents: Kadcyla      To help prevent nausea and vomiting after your treatment, we encourage you to take your nausea medication as directed.  BELOW ARE SYMPTOMS THAT SHOULD BE REPORTED IMMEDIATELY: *FEVER GREATER THAN 100.4 F (38 C) OR HIGHER *CHILLS OR SWEATING *NAUSEA AND VOMITING THAT IS NOT CONTROLLED WITH YOUR NAUSEA MEDICATION *UNUSUAL SHORTNESS OF BREATH *UNUSUAL BRUISING OR BLEEDING *URINARY PROBLEMS (pain or burning when urinating, or frequent urination) *BOWEL PROBLEMS (unusual diarrhea, constipation, pain near the anus) TENDERNESS IN MOUTH AND THROAT WITH OR WITHOUT PRESENCE OF ULCERS (sore throat, sores in mouth, or a toothache) UNUSUAL RASH, SWELLING OR PAIN  UNUSUAL VAGINAL DISCHARGE OR ITCHING   Items with * indicate a potential emergency and should be followed up as soon as possible or go to the Emergency Department if any problems should occur.  Please show the CHEMOTHERAPY ALERT CARD or IMMUNOTHERAPY ALERT CARD at check-in to  the Emergency Department and triage nurse.  Should you have questions after your visit or need to cancel or reschedule your appointment, please contact [facility]  Dept: [phone]  and follow the prompts.  Office hours are 8:00 a.m. to 4:30 p.m. Monday - Friday. Please note that voicemails left after 4:00 p.m. may not be returned until the following business day.  We are closed weekends and major holidays. You have access to a nurse at all times for urgent questions. Please call the main number to the clinic Dept: [phone] and follow the prompts.   For any non-urgent questions, you may also contact your provider using MyChart. We now offer e-Visits for anyone 33 and older to request care online for non-urgent symptoms. For details visit [email or web address].   Also download the MyChart app! Go to the app store, search "MyChart", open the app, select [facility], and log in with your MyChart username and password.  Masks are optional in the cancer centers. If you would like for your care team to wear a mask while they are taking care of you, please let them know. You may have one support person who is at least 81 years old accompany you for your appointments.

## 2021-10-13 NOTE — Progress Notes (Signed)
MD Burr Medico would like to reduce patient's dose to 200 mg (~3 mg/kg) due to weight loss.   Larene Beach, PharmD

## 2021-10-14 ENCOUNTER — Encounter: Payer: Self-pay | Admitting: Hematology

## 2021-10-14 ENCOUNTER — Other Ambulatory Visit: Payer: Self-pay

## 2021-10-22 ENCOUNTER — Other Ambulatory Visit: Payer: Self-pay | Admitting: Internal Medicine

## 2021-10-24 ENCOUNTER — Other Ambulatory Visit: Payer: Self-pay

## 2021-10-25 NOTE — Telephone Encounter (Signed)
Pt's PCP notified to refill Lipitor tabs 20mg  for pt.

## 2021-10-26 ENCOUNTER — Encounter: Payer: Self-pay | Admitting: Hematology

## 2021-10-26 MED ORDER — ATORVASTATIN CALCIUM 20 MG PO TABS
20.0000 mg | ORAL_TABLET | Freq: Every day | ORAL | 3 refills | Status: AC
Start: 2021-10-26 — End: ?

## 2021-11-03 ENCOUNTER — Inpatient Hospital Stay: Payer: Medicare Other

## 2021-11-03 ENCOUNTER — Other Ambulatory Visit: Payer: Self-pay

## 2021-11-03 VITALS — BP 166/76 | HR 73 | Temp 98.2°F | Resp 16 | Wt 157.0 lb

## 2021-11-03 DIAGNOSIS — Z95828 Presence of other vascular implants and grafts: Secondary | ICD-10-CM

## 2021-11-03 DIAGNOSIS — Z171 Estrogen receptor negative status [ER-]: Secondary | ICD-10-CM

## 2021-11-03 DIAGNOSIS — C50812 Malignant neoplasm of overlapping sites of left female breast: Secondary | ICD-10-CM | POA: Diagnosis not present

## 2021-11-03 LAB — CBC WITH DIFFERENTIAL (CANCER CENTER ONLY)
Abs Immature Granulocytes: 0.01 10*3/uL (ref 0.00–0.07)
Basophils Absolute: 0 10*3/uL (ref 0.0–0.1)
Basophils Relative: 1 %
Eosinophils Absolute: 0.2 10*3/uL (ref 0.0–0.5)
Eosinophils Relative: 4 %
HCT: 29.2 % — ABNORMAL LOW (ref 36.0–46.0)
Hemoglobin: 9.7 g/dL — ABNORMAL LOW (ref 12.0–15.0)
Immature Granulocytes: 0 %
Lymphocytes Relative: 37 %
Lymphs Abs: 1.8 10*3/uL (ref 0.7–4.0)
MCH: 23.5 pg — ABNORMAL LOW (ref 26.0–34.0)
MCHC: 33.2 g/dL (ref 30.0–36.0)
MCV: 70.7 fL — ABNORMAL LOW (ref 80.0–100.0)
Monocytes Absolute: 0.4 10*3/uL (ref 0.1–1.0)
Monocytes Relative: 8 %
Neutro Abs: 2.4 10*3/uL (ref 1.7–7.7)
Neutrophils Relative %: 50 %
Platelet Count: 184 10*3/uL (ref 150–400)
RBC: 4.13 MIL/uL (ref 3.87–5.11)
RDW: 17.1 % — ABNORMAL HIGH (ref 11.5–15.5)
WBC Count: 4.8 10*3/uL (ref 4.0–10.5)
nRBC: 0 % (ref 0.0–0.2)

## 2021-11-03 LAB — CMP (CANCER CENTER ONLY)
ALT: 29 U/L (ref 0–44)
AST: 40 U/L (ref 15–41)
Albumin: 3.2 g/dL — ABNORMAL LOW (ref 3.5–5.0)
Alkaline Phosphatase: 86 U/L (ref 38–126)
Anion gap: 8 (ref 5–15)
BUN: 19 mg/dL (ref 8–23)
CO2: 24 mmol/L (ref 22–32)
Calcium: 9.9 mg/dL (ref 8.9–10.3)
Chloride: 109 mmol/L (ref 98–111)
Creatinine: 1.27 mg/dL — ABNORMAL HIGH (ref 0.44–1.00)
GFR, Estimated: 42 mL/min — ABNORMAL LOW (ref >60)
Glucose, Bld: 94 mg/dL (ref 70–99)
Potassium: 3.8 mmol/L (ref 3.5–5.1)
Sodium: 141 mmol/L (ref 135–145)
Total Bilirubin: 0.4 mg/dL (ref 0.3–1.2)
Total Protein: 6.7 g/dL (ref 6.5–8.1)

## 2021-11-03 MED ORDER — DIPHENHYDRAMINE HCL 25 MG PO CAPS
50.0000 mg | ORAL_CAPSULE | Freq: Once | ORAL | Status: AC
  Administered 2021-11-03: 50 mg via ORAL
  Filled 2021-11-03: qty 2

## 2021-11-03 MED ORDER — HEPARIN SOD (PORK) LOCK FLUSH 100 UNIT/ML IV SOLN
500.0000 [IU] | Freq: Once | INTRAVENOUS | Status: AC | PRN
  Administered 2021-11-03: 500 [IU]

## 2021-11-03 MED ORDER — SODIUM CHLORIDE 0.9 % IV SOLN: Freq: Once | INTRAVENOUS | Status: AC

## 2021-11-03 MED ORDER — ACETAMINOPHEN 325 MG PO TABS
650.0000 mg | ORAL_TABLET | Freq: Once | ORAL | Status: AC
  Administered 2021-11-03: 650 mg via ORAL
  Filled 2021-11-03: qty 2

## 2021-11-03 MED ORDER — SODIUM CHLORIDE 0.9 % IV SOLN
3.0000 mg/kg | Freq: Once | INTRAVENOUS | Status: AC
  Administered 2021-11-03: 200 mg via INTRAVENOUS
  Filled 2021-11-03: qty 10

## 2021-11-03 MED ORDER — SODIUM CHLORIDE 0.9% FLUSH
10.0000 mL | INTRAVENOUS | Status: DC | PRN
  Administered 2021-11-03: 10 mL

## 2021-11-03 MED ORDER — SODIUM CHLORIDE 0.9% FLUSH
10.0000 mL | Freq: Once | INTRAVENOUS | Status: AC
  Administered 2021-11-03: 10 mL

## 2021-11-03 NOTE — Patient Instructions (Signed)
[facility] ONCOLOGY   Discharge Instructions: Thank you for choosing [facility] to provide your oncology and hematology care.   If you have a lab appointment with the [facility], please go directly to the [facility] and check in at the registration area.   Wear comfortable clothing and clothing appropriate for easy access to any Portacath or PICC line.   We strive to give you quality time with your provider. You may need to reschedule your appointment if you arrive late (15 or more minutes).  Arriving late affects you and other patients whose appointments are after yours.  Also, if you miss three or more appointments without notifying the office, you may be dismissed from the clinic at the provider's discretion.      For prescription refill requests, have your pharmacy contact our office and allow 72 hours for refills to be completed.    Today you received the following chemotherapy and/or immunotherapy agents: ado-trastuzumab emtansine      To help prevent nausea and vomiting after your treatment, we encourage you to take your nausea medication as directed.  BELOW ARE SYMPTOMS THAT SHOULD BE REPORTED IMMEDIATELY: *FEVER GREATER THAN 100.4 F (38 C) OR HIGHER *CHILLS OR SWEATING *NAUSEA AND VOMITING THAT IS NOT CONTROLLED WITH YOUR NAUSEA MEDICATION *UNUSUAL SHORTNESS OF BREATH *UNUSUAL BRUISING OR BLEEDING *URINARY PROBLEMS (pain or burning when urinating, or frequent urination) *BOWEL PROBLEMS (unusual diarrhea, constipation, pain near the anus) TENDERNESS IN MOUTH AND THROAT WITH OR WITHOUT PRESENCE OF ULCERS (sore throat, sores in mouth, or a toothache) UNUSUAL RASH, SWELLING OR PAIN  UNUSUAL VAGINAL DISCHARGE OR ITCHING   Items with * indicate a potential emergency and should be followed up as soon as possible or go to the Emergency Department if any problems should occur.  Please show the CHEMOTHERAPY ALERT CARD or IMMUNOTHERAPY ALERT  CARD at check-in to the Emergency Department and triage nurse.  Should you have questions after your visit or need to cancel or reschedule your appointment, please contact [facility]  Dept: [phone]  and follow the prompts.  Office hours are 8:00 a.m. to 4:30 p.m. Monday - Friday. Please note that voicemails left after 4:00 p.m. may not be returned until the following business day.  We are closed weekends and major holidays. You have access to a nurse at all times for urgent questions. Please call the main number to the clinic Dept: [phone] and follow the prompts.   For any non-urgent questions, you may also contact your provider using MyChart. We now offer e-Visits for anyone 72 and older to request care online for non-urgent symptoms. For details visit [email or web address].   Also download the MyChart app! Go to the app store, search "MyChart", open the app, select [facility], and log in with your MyChart username and password.  Masks are optional in the cancer centers. If you would like for your care team to wear a mask while they are taking care of you, please let them know. You may have one support person who is at least 81 years old accompany you for your appointments.

## 2021-11-04 LAB — CANCER ANTIGEN 27.29: CA 27.29: 33.4 U/mL (ref 0.0–38.6)

## 2021-11-05 ENCOUNTER — Other Ambulatory Visit: Payer: Self-pay

## 2021-11-14 ENCOUNTER — Other Ambulatory Visit: Payer: Self-pay | Admitting: Hematology

## 2021-11-14 DIAGNOSIS — C50412 Malignant neoplasm of upper-outer quadrant of left female breast: Secondary | ICD-10-CM

## 2021-11-25 ENCOUNTER — Inpatient Hospital Stay: Payer: Medicare Other

## 2021-11-25 ENCOUNTER — Other Ambulatory Visit: Payer: Self-pay

## 2021-11-25 ENCOUNTER — Encounter: Payer: Self-pay | Admitting: Hematology

## 2021-11-25 ENCOUNTER — Inpatient Hospital Stay: Payer: Medicare Other | Attending: Nurse Practitioner | Admitting: Hematology

## 2021-11-25 VITALS — BP 144/72 | HR 89 | Temp 98.3°F | Resp 20

## 2021-11-25 VITALS — BP 152/65 | HR 82 | Temp 98.0°F | Resp 17 | Ht 64.0 in | Wt 160.3 lb

## 2021-11-25 DIAGNOSIS — R10819 Abdominal tenderness, unspecified site: Secondary | ICD-10-CM | POA: Insufficient documentation

## 2021-11-25 DIAGNOSIS — R234 Changes in skin texture: Secondary | ICD-10-CM | POA: Diagnosis not present

## 2021-11-25 DIAGNOSIS — D509 Iron deficiency anemia, unspecified: Secondary | ICD-10-CM | POA: Diagnosis not present

## 2021-11-25 DIAGNOSIS — C773 Secondary and unspecified malignant neoplasm of axilla and upper limb lymph nodes: Secondary | ICD-10-CM | POA: Insufficient documentation

## 2021-11-25 DIAGNOSIS — Z171 Estrogen receptor negative status [ER-]: Secondary | ICD-10-CM

## 2021-11-25 DIAGNOSIS — M4800 Spinal stenosis, site unspecified: Secondary | ICD-10-CM | POA: Diagnosis not present

## 2021-11-25 DIAGNOSIS — C50812 Malignant neoplasm of overlapping sites of left female breast: Secondary | ICD-10-CM | POA: Insufficient documentation

## 2021-11-25 DIAGNOSIS — Z5112 Encounter for antineoplastic immunotherapy: Secondary | ICD-10-CM | POA: Insufficient documentation

## 2021-11-25 DIAGNOSIS — I2510 Atherosclerotic heart disease of native coronary artery without angina pectoris: Secondary | ICD-10-CM | POA: Diagnosis not present

## 2021-11-25 DIAGNOSIS — N200 Calculus of kidney: Secondary | ICD-10-CM | POA: Insufficient documentation

## 2021-11-25 DIAGNOSIS — M5116 Intervertebral disc disorders with radiculopathy, lumbar region: Secondary | ICD-10-CM | POA: Insufficient documentation

## 2021-11-25 DIAGNOSIS — I700 Atherosclerosis of aorta: Secondary | ICD-10-CM | POA: Insufficient documentation

## 2021-11-25 DIAGNOSIS — J439 Emphysema, unspecified: Secondary | ICD-10-CM | POA: Diagnosis not present

## 2021-11-25 DIAGNOSIS — C50412 Malignant neoplasm of upper-outer quadrant of left female breast: Secondary | ICD-10-CM

## 2021-11-25 DIAGNOSIS — Z79899 Other long term (current) drug therapy: Secondary | ICD-10-CM | POA: Insufficient documentation

## 2021-11-25 DIAGNOSIS — C7800 Secondary malignant neoplasm of unspecified lung: Secondary | ICD-10-CM | POA: Diagnosis not present

## 2021-11-25 DIAGNOSIS — Z95828 Presence of other vascular implants and grafts: Secondary | ICD-10-CM

## 2021-11-25 DIAGNOSIS — Z8673 Personal history of transient ischemic attack (TIA), and cerebral infarction without residual deficits: Secondary | ICD-10-CM | POA: Insufficient documentation

## 2021-11-25 DIAGNOSIS — M4726 Other spondylosis with radiculopathy, lumbar region: Secondary | ICD-10-CM | POA: Diagnosis not present

## 2021-11-25 DIAGNOSIS — I6782 Cerebral ischemia: Secondary | ICD-10-CM | POA: Insufficient documentation

## 2021-11-25 LAB — CMP (CANCER CENTER ONLY)
ALT: 26 U/L (ref 0–44)
AST: 51 U/L — ABNORMAL HIGH (ref 15–41)
Albumin: 3.5 g/dL (ref 3.5–5.0)
Alkaline Phosphatase: 99 U/L (ref 38–126)
Anion gap: 5 (ref 5–15)
BUN: 24 mg/dL — ABNORMAL HIGH (ref 8–23)
CO2: 26 mmol/L (ref 22–32)
Calcium: 9.9 mg/dL (ref 8.9–10.3)
Chloride: 112 mmol/L — ABNORMAL HIGH (ref 98–111)
Creatinine: 1.68 mg/dL — ABNORMAL HIGH (ref 0.44–1.00)
GFR, Estimated: 30 mL/min — ABNORMAL LOW (ref >60)
Glucose, Bld: 113 mg/dL — ABNORMAL HIGH (ref 70–99)
Potassium: 4.1 mmol/L (ref 3.5–5.1)
Sodium: 143 mmol/L (ref 135–145)
Total Bilirubin: 0.4 mg/dL (ref 0.3–1.2)
Total Protein: 6.8 g/dL (ref 6.5–8.1)

## 2021-11-25 LAB — CBC WITH DIFFERENTIAL (CANCER CENTER ONLY)
Abs Immature Granulocytes: 0.01 10*3/uL (ref 0.00–0.07)
Basophils Absolute: 0 10*3/uL (ref 0.0–0.1)
Basophils Relative: 1 %
Eosinophils Absolute: 0.2 10*3/uL (ref 0.0–0.5)
Eosinophils Relative: 4 %
HCT: 27.9 % — ABNORMAL LOW (ref 36.0–46.0)
Hemoglobin: 9.3 g/dL — ABNORMAL LOW (ref 12.0–15.0)
Immature Granulocytes: 0 %
Lymphocytes Relative: 31 %
Lymphs Abs: 1.6 10*3/uL (ref 0.7–4.0)
MCH: 24.1 pg — ABNORMAL LOW (ref 26.0–34.0)
MCHC: 33.3 g/dL (ref 30.0–36.0)
MCV: 72.3 fL — ABNORMAL LOW (ref 80.0–100.0)
Monocytes Absolute: 0.4 10*3/uL (ref 0.1–1.0)
Monocytes Relative: 8 %
Neutro Abs: 2.9 10*3/uL (ref 1.7–7.7)
Neutrophils Relative %: 56 %
Platelet Count: 199 10*3/uL (ref 150–400)
RBC: 3.86 MIL/uL — ABNORMAL LOW (ref 3.87–5.11)
RDW: 17.4 % — ABNORMAL HIGH (ref 11.5–15.5)
WBC Count: 5.1 10*3/uL (ref 4.0–10.5)
nRBC: 0 % (ref 0.0–0.2)

## 2021-11-25 MED ORDER — SODIUM CHLORIDE 0.9 % IV SOLN: Freq: Once | INTRAVENOUS | Status: AC

## 2021-11-25 MED ORDER — SODIUM CHLORIDE 0.9% FLUSH
10.0000 mL | Freq: Once | INTRAVENOUS | Status: AC
  Administered 2021-11-25: 10 mL

## 2021-11-25 MED ORDER — DIPHENHYDRAMINE HCL 25 MG PO CAPS
50.0000 mg | ORAL_CAPSULE | Freq: Once | ORAL | Status: AC
  Administered 2021-11-25: 50 mg via ORAL
  Filled 2021-11-25: qty 2

## 2021-11-25 MED ORDER — SODIUM CHLORIDE 0.9 % IV SOLN
300.0000 mg | Freq: Once | INTRAVENOUS | Status: AC
  Administered 2021-11-25: 300 mg via INTRAVENOUS
  Filled 2021-11-25: qty 15

## 2021-11-25 MED ORDER — HEPARIN SOD (PORK) LOCK FLUSH 100 UNIT/ML IV SOLN
500.0000 [IU] | Freq: Once | INTRAVENOUS | Status: AC | PRN
  Administered 2021-11-25: 500 [IU]

## 2021-11-25 MED ORDER — ACETAMINOPHEN 325 MG PO TABS
650.0000 mg | ORAL_TABLET | Freq: Once | ORAL | Status: AC
  Administered 2021-11-25: 650 mg via ORAL
  Filled 2021-11-25: qty 2

## 2021-11-25 MED ORDER — SODIUM CHLORIDE 0.9% FLUSH
10.0000 mL | INTRAVENOUS | Status: DC | PRN
  Administered 2021-11-25: 10 mL

## 2021-11-25 NOTE — Progress Notes (Signed)
Christy [name]   [phone] [phone]   Clinic Follow up Note   Patient Care Team: Nolene Ebbs, MD as PCP - General (Internal Medicine) Truitt Merle, MD as Consulting Physician (Medical Oncology)  Date of Service:  11/25/2021  CHIEF COMPLAINT: f/u of metastatic breast cancer  CURRENT THERAPY:  Kadcyla every 3 weeks, started on 09/11/20  ASSESSMENT & PLAN:  Christy [name] is a 81 y.o. female with   1. Malignant neoplasm of overlapping sites in the central and upper outer left breast, IDC, cT2N3M1 stage IV, grade 2-3, ER-/PR- HER2+, with nodal and pulmonary metastases -She initially self palpated a left breast mass in 06/2020, possibly earlier. Work-up showed 2 masses in the left upper outer breast and 3 abnormal lymph nodes. Biopsy of the 2 masses and 1 lymph node all showed IDC, grade 2-3, ER/PR negative and HER2 positive. -PET scan 08/28/20 showed: hypermetabolic known left breast malignancy with hypermetabolic left axillary, retropectoral, and supraclavicular adenopathy; several small 3-6 mm pulmonary nodules with mild hypermetabolic activity. -baseline CA 27.29 was normal. Baseline echo EF 55-60% -She was started on first-line systemic Kadcyla, given once every 3 weeks, on 09/11/20. She tolerates well with some fatigue, dose decreased with C4.  -She has had good clinical response, breast mass was no longer palpable on physical exam 06/09/21. -repeat echo 08/05/21 was stable.  -restaging CT CAP 10/07/21 was stable, NED.  -she is clinically doing well and continues to tolerate treatment. Labs reviewed, overall stable and adequate for treatment.    2.  Microcytic anemia -Hemoglobin around 10, probably related to her underlying cancer -iron and ferritin were low but WNL on 08/11/21. -hgb 9.3 today (11/25/21)  3. Spinal stenosis, Subacute stroke, H/o falls -spinal stenosis diagnosed while in [place] -subacute lacunar infarct seen on staging brain MRI 08/2020. -she  ambulates with a walker due to leg weakness, has received shots in her left hip.     PLAN: -proceed with C20 Kadcyla with same dose reduction to $RemoveBefo'3mg'okQMFmrYfIC$ /kg -lab, f/u, and Kadcyla on 10/11 as scheduled -echo before next cycle treatment   No problem-specific Assessment & Plan notes found for this encounter.   SUMMARY OF ONCOLOGIC HISTORY: Oncology History Overview Note  Cancer Staging Malignant neoplasm of upper-outer quadrant of female breast ([place]) Staging form: Breast, AJCC 8th Edition - Clinical stage from 07/31/2020: Stage IV (cT2, cN3, cM1, G2, ER-, PR-, HER2+) - Signed by Alla Feeling, NP on 08/31/2020 Stage prefix: Initial diagnosis Nuclear grade: G2 Histologic grading system: 3 grade system    Malignant neoplasm of upper-outer quadrant of female breast ([place])  07/24/2020 Breast US   On physical exam, a very firm mass is identified at the 1:30 position of the LEFT breast 5 cm from the nipple.   IMPRESSION: 1. Highly suspicious 4.3 cm mass at the 1:30 position of the LEFT breast and highly suspicious 2.7 cm mass at the 2 o'clock position of the LEFT breast, with the 2 masses encompassing an area measuring at least 6.1 cm. 2. 3 abnormal LEFT axillary lymph nodes with cortical thickening, tissue sampling of 1 of these lymph nodes recommended. 3. Anterior LEFT breast skin thickening nonspecific but may reflect dermal involvement. 4. No mammographic evidence of RIGHT breast malignancy.   07/31/2020 Initial Biopsy   Diagnosis 1. Breast, left, needle core biopsy, 1 :30 pm, 5cmfn - INVASIVE DUCTAL CARCINOMA, SEE COMMENT. 2. Breast, left, needle core biopsy, 2 o'clock, 8cmfn - INVASIVE DUCTAL CARCINOMA, SEE COMMENT. - DUCTAL CARCINOMA IN  SITU. 3. Lymph node, needle/core biopsy, left axillary - METASTATIC CARCINOMA IN A LYMPH NODE. Microscopic Comment 1. and 3. The carcinoma in both parts has a similar morphology and appears grade 2-3. The DCIS in part 2 is high-grade with  necrosis. The carcinoma measures 15 mm (part 1) and 14 mm (part 2) in greatest linear extent  2. PROGNOSTIC INDICATORS Results: IMMUNOHISTOCHEMICAL AND MORPHOMETRIC ANALYSIS PERFORMED MANUALLY The tumor cells are POSITIVE for Her2 (3+). Estrogen Receptor: 0%, NEGATIVE Progesterone Receptor: 0%, NEGATIVE Proliferation Marker Ki67: 20%    07/31/2020 Cancer Staging   Staging form: Breast, AJCC 8th Edition - Clinical stage from 07/31/2020: Stage IV (cT2, cN3, cM1, G2, ER-, PR-, HER2+) - Signed by Alla Feeling, NP on 08/31/2020 Stage prefix: Initial diagnosis Nuclear grade: G2 Histologic grading system: 3 grade system   08/13/2020 Initial Diagnosis   Malignant neoplasm of upper-outer quadrant of female breast ([place])   08/28/2020 PET scan   IMPRESSION: Signs of LEFT breast cancer with LEFT axillary and retropectoral adenopathy as well as LEFT supraclavicular adenopathy.   Signs of pulmonary metastatic disease.   Mild asymmetric uptake in the LEFT as compared to the RIGHT adrenal gland. Equivocal but suspicious, attention on follow-up.   Aortic atherosclerosis.   Cystic changes and potential bronchiectasis in the RIGHT lung base likely post infectious or inflammatory. Correlate with any respiratory symptoms. Comparison with prior imaging may be helpful with attention on follow-up.   08/28/2020 Imaging   BRAIN MRI IMPRESSION: 1. Subacute lacunar infarct of the left basal ganglia with no malignant hemorrhagic transformation or mass effect. Underlying chronic bilateral basal ganglia ischemia. 2. No metastatic disease or other acute intracranial abnormality identified.   08/28/2020 Imaging   Breast MRI   IMPRESSION: 1. 8.1 x 8.1 x 5.1 cm area of biopsy-proven malignancy in the central, upper outer and lower outer quadrants of the left breast. 2. Diffuse anterior left breast skin thickening without abnormal enhancement. This may represent thickening due to lymphedema associated with  lymphatic obstruction. 3. Metastatic level 1 and level 2 left axillary adenopathy. 4. No evidence of malignancy on the right.   09/11/2020 - 11/03/2021 Chemotherapy   Patient is on Treatment Plan : BREAST ADO-Trastuzumab Emtansine (Kadcyla) q21d     09/11/2020 -  Chemotherapy   Patient is on Treatment Plan : BREAST ADO-Trastuzumab Emtansine (Kadcyla) q21d     11/30/2020 Imaging   CT Chest w/o contrast  IMPRESSION: 1. Interval response to therapy. The left breast mass has mildly decreased in size in the interval. 2. Decrease in size of left axillary and left retropectoral lymph nodes. 3. Small right lung nodules stable to improved. 4. Coronary artery calcifications noted. 5. Aortic Atherosclerosis (ICD10-I70.0).   03/04/2021 PET scan   IMPRESSION: 1. Overall marked improvement compared to the prior PET-CT, with resolution of hypermetabolic activity and pathologic enlargement of the previously involved lymph nodes; lack of hypermetabolic activity and nonvisualization of prior right-sided lung nodules, and substantial reduction in size and activity in the left breast primary. 2. New ground-glass opacity medially in the right lower lobe with low-grade metabolic activity, highly likely to be inflammatory (alveolitis) given the morphology and appearance. 3. Other imaging findings of potential clinical significance: Aortic Atherosclerosis (ICD10-I70.0). Coronary atherosclerosis. Mitral valve calcifications. Punctate nonobstructive left renal calculi. Lower lumbar impingement due to chronic spondylosis and degenerative disc disease. Remote left basal ganglia infarct.   06/07/2021 Imaging   EXAM: CT CHEST, ABDOMEN AND PELVIS WITHOUT CONTRAST  IMPRESSION: 1. Scattered tiny bilateral pulmonary  nodules are similar to the prior exams, favored to be benign. 2. Otherwise, no evidence of metastatic disease within the chest, abdomen, or pelvis. 3. Right lower lobe ground-glass nodule has resolved  since the prior PET 4.  Possible constipation. 5. Coronary artery atherosclerosis. Aortic Atherosclerosis (ICD10-I70.0). Emphysema (ICD10-J43.9). 6. Left nephrolithiasis.      INTERVAL HISTORY:  Christy [name] is here for a follow up of metastatic breast cancer. She was last seen by me on 10/13/21. She presents to the clinic alone. She tells me she received injections in her hip to help her leg weakness. She notes occasional tenderness of her left armpit/axilla.   All other systems were reviewed with the patient and are negative.  MEDICAL HISTORY:  Past Medical History:  Diagnosis Date   Diabetes mellitus without complication ([place])    Hyperlipidemia    Hypertension     SURGICAL HISTORY: Past Surgical History:  Procedure Laterality Date   FOOT FRACTURE SURGERY Right    IR IMAGING GUIDED PORT INSERTION  09/22/2020   IR RADIOLOGIST EVAL & MGMT  09/25/2020    I have reviewed the social history and family history with the patient and they are unchanged from previous note.  ALLERGIES:  has No Known Allergies.  MEDICATIONS:  Current Outpatient Medications  Medication Sig Dispense Refill   amLODipine (NORVASC) 5 MG tablet  (Patient not taking: Reported on 09/17/2020)     aspirin EC 325 MG tablet Take 1 tablet (325 mg total) by mouth daily. 30 tablet 5   atorvastatin (LIPITOR) 20 MG tablet Take 1 tablet (20 mg total) by mouth daily. 60 tablet 3   cetirizine (ZYRTEC ALLERGY) 10 MG tablet Take 1 tablet (10 mg total) by mouth daily. (Patient not taking: Reported on 09/17/2020) 30 tablet 0   fluticasone (FLONASE) 50 MCG/ACT nasal spray Place 1 spray into both nostrils daily. (Patient not taking: Reported on 09/17/2020) 1 g 0   lidocaine-prilocaine (EMLA) cream Apply 1 application topically as needed. 30 g 2   metFORMIN (GLUCOPHAGE) 500 MG tablet Take 1 tablet (500 mg total) by mouth 2 (two) times daily with a meal. 60 tablet 1   olmesartan (BENICAR) 40 MG tablet      No current  facility-administered medications for this visit.   Facility-Administered Medications Ordered in Other Visits  Medication Dose Route Frequency Provider Last Rate Last Admin   acetaminophen (TYLENOL) tablet 650 mg  650 mg Oral Once Truitt Merle, MD       ado-trastuzumab emtansine [facility]) 220 mg in sodium chloride 0.9 % 250 mL chemo infusion  3 mg/kg (Treatment Plan Recorded) Intravenous Once Truitt Merle, MD       diphenhydrAMINE (BENADRYL) capsule 50 mg  50 mg Oral Once Truitt Merle, MD       heparin lock flush 100 unit/mL  500 Units Intracatheter Once PRN Truitt Merle, MD       sodium chloride flush (NS) 0.9 % injection 10 mL  10 mL Intracatheter PRN Truitt Merle, MD        PHYSICAL EXAMINATION: ECOG PERFORMANCE STATUS: 1 - Symptomatic but completely ambulatory  Vitals:   11/25/21 1357  BP: (!) 152/65  Pulse: 82  Resp: 17  Temp: 98 F (36.7 C)  SpO2: 99%   Wt Readings from Last 3 Encounters:  11/25/21 160 lb 4.8 oz (72.7 kg)  11/03/21 157 lb (71.2 kg)  10/13/21 153 lb 12.8 oz (69.8 kg)     GENERAL:alert, no distress and comfortable SKIN: skin color,  texture, turgor are normal, no rashes or significant lesions EYES: normal, Conjunctiva are pink and non-injected, sclera clear  LYMPH:  no palpable lymphadenopathy in the axillary  Musculoskeletal:no cyanosis of digits and no clubbing  NEURO: alert & oriented x 3 with fluent speech, no focal motor/sensory deficits  LABORATORY DATA:  I have reviewed the data as listed    Latest Ref Rng & Units 11/25/2021   12:50 PM 11/03/2021    2:15 PM 10/13/2021   12:15 PM  CBC  WBC 4.0 - 10.5 K/uL 5.1  4.8  4.7   Hemoglobin 12.0 - 15.0 g/dL 9.3  9.7  10.4   Hematocrit 36.0 - 46.0 % 27.9  29.2  30.7   Platelets 150 - 400 K/uL 199  184  198         Latest Ref Rng & Units 11/25/2021   12:50 PM 11/03/2021    2:16 PM 10/13/2021   12:15 PM  CMP  Glucose 70 - 99 mg/dL 113  94  105   BUN 8 - 23 mg/dL $Remove'24  19  21   'vXTsiEO$ Creatinine 0.44 - 1.00 mg/dL 1.68  1.27   1.80   Sodium 135 - 145 mmol/L 143  141  143   Potassium 3.5 - 5.1 mmol/L 4.1  3.8  3.8   Chloride 98 - 111 mmol/L 112  109  109   CO2 22 - 32 mmol/L $RemoveB'26  24  29   'nFpfDnuu$ Calcium 8.9 - 10.3 mg/dL 9.9  9.9  10.3   Total Protein 6.5 - 8.1 g/dL 6.8  6.7  7.4   Total Bilirubin 0.3 - 1.2 mg/dL 0.4  0.4  0.6   Alkaline Phos 38 - 126 U/L 99  86  85   AST 15 - 41 U/L 51  40  37   ALT 0 - 44 U/L $Remo'26  29  19       'wuHYU$ RADIOGRAPHIC STUDIES: I have personally reviewed the radiological images as listed and agreed with the findings in the report. No results found.    Orders Placed This Encounter  Procedures   CBC with Differential ([facility] Only)    Standing Status:   Future    Standing Expiration Date:   01/07/2023   CMP ([facility] only)    Standing Status:   Future    Standing Expiration Date:   01/07/2023   ECHOCARDIOGRAM COMPLETE    Standing Status:   Future    Standing Expiration Date:   11/26/2022    Order Specific Question:   Where should this test be performed    Answer:   [facility]    Order Specific Question:   Perflutren DEFINITY (image enhancing agent) should be administered unless hypersensitivity or allergy exist    Answer:   Administer Perflutren    Order Specific Question:   Is a special reader required? (athlete or structural heart)    Answer:   No    Order Specific Question:   Does this study need to be read by the Structural team/Level 3 readers?    Answer:   No    Order Specific Question:   Reason for exam-Echo    Answer:   Chemo  Z09   All questions were answered. The patient knows to call the clinic with any problems, questions or concerns. No barriers to learning was detected. The total time spent in the appointment was 30 minutes.     Truitt Merle, MD 11/25/2021   I, Wilburn Mylar,  am acting as scribe for Truitt Merle, MD.   I have reviewed the above documentation for accuracy and completeness, and I agree with the above.

## 2021-11-25 NOTE — Progress Notes (Signed)
Per Dr. Burr Medico, ok for treatment today with elevated serum creatine 1.68 mg/dL

## 2021-11-25 NOTE — Patient Instructions (Signed)
[facility] ONCOLOGY  Discharge Instructions: Thank you for choosing [facility] to provide your oncology and hematology care.   If you have a lab appointment with the [facility], please go directly to the [facility] and check in at the registration area.   Wear comfortable clothing and clothing appropriate for easy access to any Portacath or PICC line.   We strive to give you quality time with your provider. You may need to reschedule your appointment if you arrive late (15 or more minutes).  Arriving late affects you and other patients whose appointments are after yours.  Also, if you miss three or more appointments without notifying the office, you may be dismissed from the clinic at the provider's discretion.      For prescription refill requests, have your pharmacy contact our office and allow 72 hours for refills to be completed.    Today you received the following chemotherapy and/or immunotherapy agent: Kadcyla   To help prevent nausea and vomiting after your treatment, we encourage you to take your nausea medication as directed.  BELOW ARE SYMPTOMS THAT SHOULD BE REPORTED IMMEDIATELY: *FEVER GREATER THAN 100.4 F (38 C) OR HIGHER *CHILLS OR SWEATING *NAUSEA AND VOMITING THAT IS NOT CONTROLLED WITH YOUR NAUSEA MEDICATION *UNUSUAL SHORTNESS OF BREATH *UNUSUAL BRUISING OR BLEEDING *URINARY PROBLEMS (pain or burning when urinating, or frequent urination) *BOWEL PROBLEMS (unusual diarrhea, constipation, pain near the anus) TENDERNESS IN MOUTH AND THROAT WITH OR WITHOUT PRESENCE OF ULCERS (sore throat, sores in mouth, or a toothache) UNUSUAL RASH, SWELLING OR PAIN  UNUSUAL VAGINAL DISCHARGE OR ITCHING   Items with * indicate a potential emergency and should be followed up as soon as possible or go to the Emergency Department if any problems should occur.  Please show the CHEMOTHERAPY ALERT CARD or IMMUNOTHERAPY ALERT CARD at check-in to the  Emergency Department and triage nurse.  Should you have questions after your visit or need to cancel or reschedule your appointment, please contact [facility]  Dept: [phone]  and follow the prompts.  Office hours are 8:00 a.m. to 4:30 p.m. Monday - Friday. Please note that voicemails left after 4:00 p.m. may not be returned until the following business day.  We are closed weekends and major holidays. You have access to a nurse at all times for urgent questions. Please call the main number to the clinic Dept: [phone] and follow the prompts.   For any non-urgent questions, you may also contact your provider using MyChart. We now offer e-Visits for anyone 6 and older to request care online for non-urgent symptoms. For details visit [email or web address].   Also download the MyChart app! Go to the app store, search "MyChart", open the app, select [facility], and log in with your MyChart username and password.  Masks are optional in the cancer centers. If you would like for your care team to wear a mask while they are taking care of you, please let them know. You may have one support person who is at least 81 years old accompany you for your appointments.  Ado-Trastuzumab Emtansine Injection What is this medication? ADO-TRASTUZUMAB EMTANSINE (ADD oh traz TOO zuh mab em TAN zine) treats breast cancer. It works by blocking a protein that causes cancer cells to grow and multiply. This helps to slow or stop the spread of cancer cells. This medicine may be used for other purposes; ask your health care provider or pharmacist if you have  questions. COMMON BRAND NAME(S): Kadcyla What should I tell my care team before I take this medication? They need to know if you have any of these conditions: Heart failure Liver disease Low platelet levels Lung disease Tingling of the fingers or toes or other nerve disorder An unusual or allergic reaction to ado-trastuzumab  emtansine, other medications, foods, dyes, or preservatives Pregnant or trying to get pregnant Breast-feeding How should I use this medication? This medication is infused into a vein. It is given by your care team in a hospital or clinic setting. Talk to your care team about the use of this medication in children. Special care may be needed. Overdosage: If you think you have taken too much of this medicine contact a poison control center or emergency room at once. NOTE: This medicine is only for you. Do not share this medicine with others. What if I miss a dose? Keep appointments for follow-up doses. It is important not to miss your dose. Call your care team if you are unable to keep an appointment. What may interact with this medication? Atazanavir Boceprevir Clarithromycin Dalfopristin; quinupristin Delavirdine Indinavir Isoniazid, INH Itraconazole Ketoconazole Nefazodone Nelfinavir Ritonavir Telaprevir Telithromycin Tipranavir Voriconazole This list may not describe all possible interactions. Give your health care provider a list of all the medicines, herbs, non-prescription drugs, or dietary supplements you use. Also tell them if you smoke, drink alcohol, or use illegal drugs. Some items may interact with your medicine. What should I watch for while using this medication? This medication may make you feel generally unwell. This is not uncommon, as chemotherapy can affect healthy cells as well as cancer cells. Report any side effects. Continue your course of treatment even though you feel ill unless your care team tells you to stop. You may need blood work while taking this medication. This medication may increase your risk to bruise or bleed. Call your care team if you notice any unusual bleeding. Be careful brushing or flossing your teeth or using a toothpick because you may get an infection or bleed more easily. If you have any dental work done, tell your dentist you are  receiving this medication. Talk to your care team if you may be pregnant. Serious birth defects can occur if you take this medication during pregnancy and for 7 months after the last dose. You will need a negative pregnancy test before starting this medication. Contraception is recommended while taking this medication and for 7 months after the last dose. Your care team can help you find the option that works for you. If your partner can get pregnant, use a condom during sex while taking this medication and for 4 months after the last dose. Do not breastfeed while taking this medication and for 7 months after the last dose. This medication may cause infertility. Talk to your care team if you are concerned with your fertility. What side effects may I notice from receiving this medication? Side effects that you should report to your care team as soon as possible: Allergic reactions--skin rash, itching, hives, swelling of the face, lips, tongue, or throat Bleeding--bloody or black, tar-like stools, vomiting blood or brown material that looks like coffee grounds, red or dark brown urine, small red or purple spots on skin, unusual bruising or bleeding Dry cough, shortness of breath or trouble breathing Heart failure--shortness of breath, swelling of the ankles, feet, or hands, sudden weight gain, unusual weakness or fatigue Infusion reactions--chest pain, shortness of breath or trouble breathing, feeling faint or  lightheaded Liver injury--right upper belly pain, loss of appetite, nausea, light-colored stool, dark yellow or brown urine, yellowing skin or eyes, unusual weakness or fatigue Pain, tingling, or numbness in the hands or feet Painful swelling, warmth, or redness of the skin, blisters or sores at the infusion site Side effects that usually do not require medical attention (report to your care team if they continue or are bothersome): Constipation Fatigue Headache Muscle pain Nausea This list  may not describe all possible side effects. Call your doctor for medical advice about side effects. You may report side effects to FDA at 1-800-FDA-1088. Where should I keep my medication? This medication is given in a hospital or clinic. It will not be stored at home. NOTE: This sheet is a summary. It may not cover all possible information. If you have questions about this medicine, talk to your doctor, pharmacist, or health care provider.  2023 Elsevier/Gold Standard (2021-07-09 00:00:00)

## 2021-11-26 ENCOUNTER — Other Ambulatory Visit: Payer: Self-pay

## 2021-12-06 ENCOUNTER — Other Ambulatory Visit (HOSPITAL_COMMUNITY): Payer: Medicare Other

## 2021-12-06 ENCOUNTER — Other Ambulatory Visit: Payer: Self-pay

## 2021-12-07 ENCOUNTER — Ambulatory Visit (HOSPITAL_COMMUNITY)
Admission: RE | Admit: 2021-12-07 | Discharge: 2021-12-07 | Disposition: A | Discharge status: Home / Self Care | Payer: Medicare Other | Source: Ambulatory Visit | Attending: Hematology | Admitting: Hematology

## 2021-12-07 DIAGNOSIS — Z0189 Encounter for other specified special examinations: Secondary | ICD-10-CM | POA: Diagnosis not present

## 2021-12-07 DIAGNOSIS — C50412 Malignant neoplasm of upper-outer quadrant of left female breast: Secondary | ICD-10-CM | POA: Diagnosis not present

## 2021-12-07 DIAGNOSIS — I3481 Nonrheumatic mitral (valve) annulus calcification: Secondary | ICD-10-CM | POA: Diagnosis not present

## 2021-12-07 DIAGNOSIS — Z171 Estrogen receptor negative status [ER-]: Secondary | ICD-10-CM | POA: Diagnosis not present

## 2021-12-07 DIAGNOSIS — Z01818 Encounter for other preprocedural examination: Secondary | ICD-10-CM | POA: Diagnosis not present

## 2021-12-07 LAB — ECHOCARDIOGRAM COMPLETE
AR max vel: 2.66 cm2
AV Area VTI: 2.76 cm2
AV Area mean vel: 2.61 cm2
AV Mean grad: 3 mmHg
AV Peak grad: 5.3 mmHg
Ao pk vel: 1.15 m/s
Area-P 1/2: 4.17 cm2
S' Lateral: 2.9 cm

## 2021-12-15 ENCOUNTER — Inpatient Hospital Stay: Payer: Medicare Other

## 2021-12-15 ENCOUNTER — Inpatient Hospital Stay (HOSPITAL_BASED_OUTPATIENT_CLINIC_OR_DEPARTMENT_OTHER): Payer: Medicare Other | Admitting: Hematology

## 2021-12-15 ENCOUNTER — Other Ambulatory Visit: Payer: Self-pay

## 2021-12-15 ENCOUNTER — Inpatient Hospital Stay: Payer: Medicare Other | Attending: Nurse Practitioner

## 2021-12-15 ENCOUNTER — Encounter: Payer: Self-pay | Admitting: Hematology

## 2021-12-15 VITALS — BP 154/60 | HR 73 | Resp 16

## 2021-12-15 VITALS — BP 132/50 | HR 82 | Temp 97.5°F | Resp 16 | Ht 64.0 in | Wt 169.4 lb

## 2021-12-15 DIAGNOSIS — C7800 Secondary malignant neoplasm of unspecified lung: Secondary | ICD-10-CM | POA: Insufficient documentation

## 2021-12-15 DIAGNOSIS — J439 Emphysema, unspecified: Secondary | ICD-10-CM | POA: Insufficient documentation

## 2021-12-15 DIAGNOSIS — M5116 Intervertebral disc disorders with radiculopathy, lumbar region: Secondary | ICD-10-CM | POA: Insufficient documentation

## 2021-12-15 DIAGNOSIS — R10819 Abdominal tenderness, unspecified site: Secondary | ICD-10-CM | POA: Diagnosis not present

## 2021-12-15 DIAGNOSIS — N200 Calculus of kidney: Secondary | ICD-10-CM | POA: Insufficient documentation

## 2021-12-15 DIAGNOSIS — I2510 Atherosclerotic heart disease of native coronary artery without angina pectoris: Secondary | ICD-10-CM | POA: Diagnosis not present

## 2021-12-15 DIAGNOSIS — Z171 Estrogen receptor negative status [ER-]: Secondary | ICD-10-CM

## 2021-12-15 DIAGNOSIS — C50412 Malignant neoplasm of upper-outer quadrant of left female breast: Secondary | ICD-10-CM

## 2021-12-15 DIAGNOSIS — K5900 Constipation, unspecified: Secondary | ICD-10-CM | POA: Insufficient documentation

## 2021-12-15 DIAGNOSIS — I6381 Other cerebral infarction due to occlusion or stenosis of small artery: Secondary | ICD-10-CM | POA: Diagnosis not present

## 2021-12-15 DIAGNOSIS — I700 Atherosclerosis of aorta: Secondary | ICD-10-CM | POA: Diagnosis not present

## 2021-12-15 DIAGNOSIS — M4726 Other spondylosis with radiculopathy, lumbar region: Secondary | ICD-10-CM | POA: Insufficient documentation

## 2021-12-15 DIAGNOSIS — Z95828 Presence of other vascular implants and grafts: Secondary | ICD-10-CM

## 2021-12-15 DIAGNOSIS — M4800 Spinal stenosis, site unspecified: Secondary | ICD-10-CM | POA: Insufficient documentation

## 2021-12-15 DIAGNOSIS — D509 Iron deficiency anemia, unspecified: Secondary | ICD-10-CM | POA: Insufficient documentation

## 2021-12-15 DIAGNOSIS — Z8673 Personal history of transient ischemic attack (TIA), and cerebral infarction without residual deficits: Secondary | ICD-10-CM | POA: Insufficient documentation

## 2021-12-15 DIAGNOSIS — C773 Secondary and unspecified malignant neoplasm of axilla and upper limb lymph nodes: Secondary | ICD-10-CM | POA: Diagnosis present

## 2021-12-15 DIAGNOSIS — Z79899 Other long term (current) drug therapy: Secondary | ICD-10-CM | POA: Insufficient documentation

## 2021-12-15 DIAGNOSIS — C50812 Malignant neoplasm of overlapping sites of left female breast: Secondary | ICD-10-CM | POA: Insufficient documentation

## 2021-12-15 DIAGNOSIS — Z5112 Encounter for antineoplastic immunotherapy: Secondary | ICD-10-CM | POA: Insufficient documentation

## 2021-12-15 LAB — CBC WITH DIFFERENTIAL (CANCER CENTER ONLY)
Abs Immature Granulocytes: 0.01 10*3/uL (ref 0.00–0.07)
Basophils Absolute: 0 10*3/uL (ref 0.0–0.1)
Basophils Relative: 1 %
Eosinophils Absolute: 0.2 10*3/uL (ref 0.0–0.5)
Eosinophils Relative: 4 %
HCT: 29.2 % — ABNORMAL LOW (ref 36.0–46.0)
Hemoglobin: 9.8 g/dL — ABNORMAL LOW (ref 12.0–15.0)
Immature Granulocytes: 0 %
Lymphocytes Relative: 37 %
Lymphs Abs: 1.7 10*3/uL (ref 0.7–4.0)
MCH: 23.8 pg — ABNORMAL LOW (ref 26.0–34.0)
MCHC: 33.6 g/dL (ref 30.0–36.0)
MCV: 70.9 fL — ABNORMAL LOW (ref 80.0–100.0)
Monocytes Absolute: 0.4 10*3/uL (ref 0.1–1.0)
Monocytes Relative: 8 %
Neutro Abs: 2.3 10*3/uL (ref 1.7–7.7)
Neutrophils Relative %: 50 %
Platelet Count: 155 10*3/uL (ref 150–400)
RBC: 4.12 MIL/uL (ref 3.87–5.11)
RDW: 16.9 % — ABNORMAL HIGH (ref 11.5–15.5)
WBC Count: 4.7 10*3/uL (ref 4.0–10.5)
nRBC: 0 % (ref 0.0–0.2)

## 2021-12-15 LAB — CMP (CANCER CENTER ONLY)
ALT: 22 U/L (ref 0–44)
AST: 56 U/L — ABNORMAL HIGH (ref 15–41)
Albumin: 3.5 g/dL (ref 3.5–5.0)
Alkaline Phosphatase: 84 U/L (ref 38–126)
Anion gap: 6 (ref 5–15)
BUN: 20 mg/dL (ref 8–23)
CO2: 27 mmol/L (ref 22–32)
Calcium: 9.8 mg/dL (ref 8.9–10.3)
Chloride: 110 mmol/L (ref 98–111)
Creatinine: 1.58 mg/dL — ABNORMAL HIGH (ref 0.44–1.00)
GFR, Estimated: 33 mL/min — ABNORMAL LOW (ref >60)
Glucose, Bld: 109 mg/dL — ABNORMAL HIGH (ref 70–99)
Potassium: 3.7 mmol/L (ref 3.5–5.1)
Sodium: 143 mmol/L (ref 135–145)
Total Bilirubin: 0.6 mg/dL (ref 0.3–1.2)
Total Protein: 6.9 g/dL (ref 6.5–8.1)

## 2021-12-15 MED ORDER — HEPARIN SOD (PORK) LOCK FLUSH 100 UNIT/ML IV SOLN
500.0000 [IU] | Freq: Once | INTRAVENOUS | Status: AC | PRN
  Administered 2021-12-15: 500 [IU]

## 2021-12-15 MED ORDER — SODIUM CHLORIDE 0.9 % IV SOLN: Freq: Once | INTRAVENOUS | Status: AC

## 2021-12-15 MED ORDER — DIPHENHYDRAMINE HCL 25 MG PO CAPS
50.0000 mg | ORAL_CAPSULE | Freq: Once | ORAL | Status: AC
  Administered 2021-12-15: 50 mg via ORAL
  Filled 2021-12-15: qty 2

## 2021-12-15 MED ORDER — SODIUM CHLORIDE 0.9 % IV SOLN
3.0000 mg/kg | Freq: Once | INTRAVENOUS | Status: AC
  Administered 2021-12-15: 220 mg via INTRAVENOUS
  Filled 2021-12-15: qty 8

## 2021-12-15 MED ORDER — SODIUM CHLORIDE 0.9% FLUSH
10.0000 mL | INTRAVENOUS | Status: DC | PRN
  Administered 2021-12-15: 10 mL

## 2021-12-15 MED ORDER — SODIUM CHLORIDE 0.9% FLUSH
10.0000 mL | Freq: Once | INTRAVENOUS | Status: AC
  Administered 2021-12-15: 10 mL

## 2021-12-15 MED ORDER — ACETAMINOPHEN 325 MG PO TABS
650.0000 mg | ORAL_TABLET | Freq: Once | ORAL | Status: AC
  Administered 2021-12-15: 650 mg via ORAL
  Filled 2021-12-15: qty 2

## 2021-12-15 NOTE — Progress Notes (Signed)
[facility]   [phone] Fax:([phone]   Clinic Follow up Note   Patient Care Team: Nolene Ebbs, MD as PCP - General (Internal Medicine) Truitt Merle, MD as Consulting Physician (Medical Oncology)  Date of Service:  12/15/2021  CHIEF COMPLAINT: f/u of metastatic breast cancer  CURRENT THERAPY:  Kadcyla every 3 weeks, started on 09/11/20  ASSESSMENT & PLAN:  Christy [name] is a 81 y.o. female with   1. Malignant neoplasm of overlapping sites in the central and upper outer left breast, IDC, cT2N3M1 stage IV, grade 2-3, ER-/PR- HER2+, with nodal and pulmonary metastases -She initially self palpated a left breast mass in 06/2020, possibly earlier. Work-up showed 2 masses in the left upper outer breast and 3 abnormal lymph nodes. Biopsy of the 2 masses and 1 lymph node all showed IDC, grade 2-3, ER/PR negative and HER2 positive. -PET scan 08/28/20 showed: hypermetabolic known left breast malignancy with hypermetabolic left axillary, retropectoral, and supraclavicular adenopathy; several small 3-6 mm pulmonary nodules with mild hypermetabolic activity. -baseline CA 27.29 was normal. Baseline echo EF 55-60% -She was started on first-line systemic Kadcyla, given once every 3 weeks, on 09/11/20. She tolerates well with some fatigue, dose decreased with C4.  -She has had good clinical response, breast mass was no longer palpable on physical exam 06/09/21.  -restaging CT CAP 10/07/21 was stable, NED.  -repeat echo 12/07/21 was stable. -she is clinically doing well from a cancer standpoint, and she continues to tolerate treatment well overall. Labs reviewed, overall stable and adequate for treatment.    2.  Microcytic anemia -Hemoglobin around 10, probably related to her underlying cancer -iron and ferritin were low but WNL on 08/11/21. -hgb stable at 9.8 today (12/15/21)  3. Constipation -we reviewed management, and I encouraged her to increase her dulcolax up to  TID. -she endorses drinking diet soda. I advised her to drink water, as this will help her bowels, in the morning to prevent nocturia.   4. Spinal stenosis, Subacute stroke, H/o falls -spinal stenosis diagnosed while in [place] -subacute lacunar infarct seen on staging brain MRI 08/2020. -she ambulates with a walker due to leg weakness, has received shots in her left hip.     PLAN: -proceed with Kadcyla at same dose reduction to 34m/kg -lab, f/u, and Kadcyla on 11/2 as scheduled, will order restaging scan on next visit, probable PET    No problem-specific Assessment & Plan notes found for this encounter.   SUMMARY OF ONCOLOGIC HISTORY: Oncology History Overview Note  Cancer Staging Malignant neoplasm of upper-outer quadrant of female breast ([facility] Staging form: Breast, AJCC 8th Edition - Clinical stage from 07/31/2020: Stage IV (cT2, cN3, cM1, G2, ER-, PR-, HER2+) - Signed by BAlla Feeling NP on 08/31/2020 Stage prefix: Initial diagnosis Nuclear grade: G2 Histologic grading system: 3 grade system    Malignant neoplasm of upper-outer quadrant of female breast ([facility]  07/24/2020 Breast [place]  On physical exam, a very firm mass is identified at the 1:30 position of the LEFT breast 5 cm from the nipple.   IMPRESSION: 1. Highly suspicious 4.3 cm mass at the 1:30 position of the LEFT breast and highly suspicious 2.7 cm mass at the 2 o'clock position of the LEFT breast, with the 2 masses encompassing an area measuring at least 6.1 cm. 2. 3 abnormal LEFT axillary lymph nodes with cortical thickening, tissue sampling of 1 of these lymph nodes recommended. 3. Anterior LEFT breast skin thickening nonspecific but may  reflect dermal involvement. 4. No mammographic evidence of RIGHT breast malignancy.   07/31/2020 Initial Biopsy   Diagnosis 1. Breast, left, needle core biopsy, 1 :30 pm, 5cmfn - INVASIVE DUCTAL CARCINOMA, SEE COMMENT. 2. Breast, left, needle core biopsy, 2 o'clock, 8cmfn -  INVASIVE DUCTAL CARCINOMA, SEE COMMENT. - DUCTAL CARCINOMA IN SITU. 3. Lymph node, needle/core biopsy, left axillary - METASTATIC CARCINOMA IN A LYMPH NODE. Microscopic Comment 1. and 3. The carcinoma in both parts has a similar morphology and appears grade 2-3. The DCIS in part 2 is high-grade with necrosis. The carcinoma measures 15 mm (part 1) and 14 mm (part 2) in greatest linear extent  2. PROGNOSTIC INDICATORS Results: IMMUNOHISTOCHEMICAL AND MORPHOMETRIC ANALYSIS PERFORMED MANUALLY The tumor cells are POSITIVE for Her2 (3+). Estrogen Receptor: 0%, NEGATIVE Progesterone Receptor: 0%, NEGATIVE Proliferation Marker Ki67: 20%    07/31/2020 Cancer Staging   Staging form: Breast, AJCC 8th Edition - Clinical stage from 07/31/2020: Stage IV (cT2, cN3, cM1, G2, ER-, PR-, HER2+) - Signed by Alla Feeling, NP on 08/31/2020 Stage prefix: Initial diagnosis Nuclear grade: G2 Histologic grading system: 3 grade system   08/13/2020 Initial Diagnosis   Malignant neoplasm of upper-outer quadrant of female breast ([place])   08/28/2020 PET scan   IMPRESSION: Signs of LEFT breast cancer with LEFT axillary and retropectoral adenopathy as well as LEFT supraclavicular adenopathy.   Signs of pulmonary metastatic disease.   Mild asymmetric uptake in the LEFT as compared to the RIGHT adrenal gland. Equivocal but suspicious, attention on follow-up.   Aortic atherosclerosis.   Cystic changes and potential bronchiectasis in the RIGHT lung base likely post infectious or inflammatory. Correlate with any respiratory symptoms. Comparison with prior imaging may be helpful with attention on follow-up.   08/28/2020 Imaging   BRAIN MRI IMPRESSION: 1. Subacute lacunar infarct of the left basal ganglia with no malignant hemorrhagic transformation or mass effect. Underlying chronic bilateral basal ganglia ischemia. 2. No metastatic disease or other acute intracranial abnormality identified.   08/28/2020 Imaging    Breast MRI   IMPRESSION: 1. 8.1 x 8.1 x 5.1 cm area of biopsy-proven malignancy in the central, upper outer and lower outer quadrants of the left breast. 2. Diffuse anterior left breast skin thickening without abnormal enhancement. This may represent thickening due to lymphedema associated with lymphatic obstruction. 3. Metastatic level 1 and level 2 left axillary adenopathy. 4. No evidence of malignancy on the right.   09/11/2020 - 11/03/2021 Chemotherapy   Patient is on Treatment Plan : BREAST ADO-Trastuzumab Emtansine (Kadcyla) q21d     09/11/2020 -  Chemotherapy   Patient is on Treatment Plan : BREAST ADO-Trastuzumab Emtansine (Kadcyla) q21d     11/30/2020 Imaging   CT Chest w/o contrast  IMPRESSION: 1. Interval response to therapy. The left breast mass has mildly decreased in size in the interval. 2. Decrease in size of left axillary and left retropectoral lymph nodes. 3. Small right lung nodules stable to improved. 4. Coronary artery calcifications noted. 5. Aortic Atherosclerosis (ICD10-I70.0).   03/04/2021 PET scan   IMPRESSION: 1. Overall marked improvement compared to the prior PET-CT, with resolution of hypermetabolic activity and pathologic enlargement of the previously involved lymph nodes; lack of hypermetabolic activity and nonvisualization of prior right-sided lung nodules, and substantial reduction in size and activity in the left breast primary. 2. New ground-glass opacity medially in the right lower lobe with low-grade metabolic activity, highly likely to be inflammatory (alveolitis) given the morphology and appearance. 3. Other imaging findings  of potential clinical significance: Aortic Atherosclerosis (ICD10-I70.0). Coronary atherosclerosis. Mitral valve calcifications. Punctate nonobstructive left renal calculi. Lower lumbar impingement due to chronic spondylosis and degenerative disc disease. Remote left basal ganglia infarct.   06/07/2021 Imaging    EXAM: CT CHEST, ABDOMEN AND PELVIS WITHOUT CONTRAST  IMPRESSION: 1. Scattered tiny bilateral pulmonary nodules are similar to the prior exams, favored to be benign. 2. Otherwise, no evidence of metastatic disease within the chest, abdomen, or pelvis. 3. Right lower lobe ground-glass nodule has resolved since the prior PET 4.  Possible constipation. 5. Coronary artery atherosclerosis. Aortic Atherosclerosis (ICD10-I70.0). Emphysema (ICD10-J43.9). 6. Left nephrolithiasis.      INTERVAL HISTORY:  Christy [name] is here for a follow up of metastatic breast cancer. She was last seen by me on 11/25/21. She presents to the clinic alone. She reports she's not sure how she's feeling. She explains she is having leg weakness and has received injections to both hips, constipation without relief from dulcolax once a day, bladder leakage. She notes her axilla tenderness has improved.   All other systems were reviewed with the patient and are negative.  MEDICAL HISTORY:  Past Medical History:  Diagnosis Date   Diabetes mellitus without complication ([facility])    Hyperlipidemia    Hypertension     SURGICAL HISTORY: Past Surgical History:  Procedure Laterality Date   FOOT FRACTURE SURGERY Right    IR IMAGING GUIDED PORT INSERTION  09/22/2020   IR RADIOLOGIST EVAL & MGMT  09/25/2020    I have reviewed the social history and family history with the patient and they are unchanged from previous note.  ALLERGIES:  has No Known Allergies.  MEDICATIONS:  Current Outpatient Medications  Medication Sig Dispense Refill   amLODipine (NORVASC) 5 MG tablet  (Patient not taking: Reported on 09/17/2020)     aspirin EC 325 MG tablet Take 1 tablet (325 mg total) by mouth daily. 30 tablet 5   atorvastatin (LIPITOR) 20 MG tablet Take 1 tablet (20 mg total) by mouth daily. 60 tablet 3   cetirizine (ZYRTEC ALLERGY) 10 MG tablet Take 1 tablet (10 mg total) by mouth daily. (Patient not taking: Reported on  09/17/2020) 30 tablet 0   fluticasone (FLONASE) 50 MCG/ACT nasal spray Place 1 spray into both nostrils daily. (Patient not taking: Reported on 09/17/2020) 1 g 0   lidocaine-prilocaine (EMLA) cream Apply 1 application topically as needed. 30 g 2   metFORMIN (GLUCOPHAGE) 500 MG tablet Take 1 tablet (500 mg total) by mouth 2 (two) times daily with a meal. 60 tablet 1   olmesartan (BENICAR) 40 MG tablet      No current facility-administered medications for this visit.   Facility-Administered Medications Ordered in Other Visits  Medication Dose Route Frequency Provider Last Rate Last Admin   acetaminophen (TYLENOL) tablet 650 mg  650 mg Oral Once Truitt Merle, MD       ado-trastuzumab emtansine [facility]) 220 mg in sodium chloride 0.9 % 250 mL chemo infusion  3 mg/kg (Treatment Plan Recorded) Intravenous Once Truitt Merle, MD       diphenhydrAMINE (BENADRYL) capsule 50 mg  50 mg Oral Once Truitt Merle, MD       heparin lock flush 100 unit/mL  500 Units Intracatheter Once PRN Truitt Merle, MD       sodium chloride flush (NS) 0.9 % injection 10 mL  10 mL Intracatheter PRN Truitt Merle, MD        PHYSICAL EXAMINATION: ECOG PERFORMANCE STATUS: 1 -  Symptomatic but completely ambulatory  Vitals:   12/15/21 1303  BP: (!) 132/50  Pulse: 82  Resp: 16  Temp: (!) 97.5 F (36.4 C)  SpO2: 100%   Wt Readings from Last 3 Encounters:  12/15/21 169 lb 6.4 oz (76.8 kg)  11/25/21 160 lb 4.8 oz (72.7 kg)  11/03/21 157 lb (71.2 kg)     GENERAL:alert, no distress and comfortable SKIN: skin color, texture, turgor are normal, no rashes or significant lesions EYES: normal, Conjunctiva are pink and non-injected, sclera clear  LYMPH:  no palpable lymphadenopathy in the cervical, axillary  LUNGS: clear to auscultation and percussion with normal breathing effort HEART: regular rate & rhythm and no murmurs and no lower extremity edema ABDOMEN:abdomen soft, non-tender and normal bowel sounds Musculoskeletal:no cyanosis of  digits and no clubbing  NEURO: alert & oriented x 3 with fluent speech, no focal motor/sensory deficits  LABORATORY DATA:  I have reviewed the data as listed    Latest Ref Rng & Units 12/15/2021   12:22 PM 11/25/2021   12:50 PM 11/03/2021    2:15 PM  CBC  WBC 4.0 - 10.5 K/uL 4.7  5.1  4.8   Hemoglobin 12.0 - 15.0 g/dL 9.8  9.3  9.7   Hematocrit 36.0 - 46.0 % 29.2  27.9  29.2   Platelets 150 - 400 K/uL 155  199  184         Latest Ref Rng & Units 12/15/2021   12:22 PM 11/25/2021   12:50 PM 11/03/2021    2:16 PM  CMP  Glucose 70 - 99 mg/dL 109  113  94   BUN 8 - 23 mg/dL 20  24  19    Creatinine 0.44 - 1.00 mg/dL 1.58  1.68  1.27   Sodium 135 - 145 mmol/L 143  143  141   Potassium 3.5 - 5.1 mmol/L 3.7  4.1  3.8   Chloride 98 - 111 mmol/L 110  112  109   CO2 22 - 32 mmol/L 27  26  24    Calcium 8.9 - 10.3 mg/dL 9.8  9.9  9.9   Total Protein 6.5 - 8.1 g/dL 6.9  6.8  6.7   Total Bilirubin 0.3 - 1.2 mg/dL 0.6  0.4  0.4   Alkaline Phos 38 - 126 U/L 84  99  86   AST 15 - 41 U/L 56  51  40   ALT 0 - 44 U/L 22  26  29        RADIOGRAPHIC STUDIES: I have personally reviewed the radiological images as listed and agreed with the findings in the report. No results found.    Orders Placed This Encounter  Procedures   CBC with Differential ([facility] Only)    Standing Status:   Future    Standing Expiration Date:   01/28/2023   CMP ([facility] only)    Standing Status:   Future    Standing Expiration Date:   01/28/2023   All questions were answered. The patient knows to call the clinic with any problems, questions or concerns. No barriers to learning was detected. The total time spent in the appointment was 30 minutes.     Truitt Merle, MD 12/15/2021   I, Wilburn Mylar, am acting as scribe for Truitt Merle, MD.   I have reviewed the above documentation for accuracy and completeness, and I agree with the above.

## 2021-12-15 NOTE — Patient Instructions (Signed)
[facility]  Discharge Instructions: Thank you for choosing [facility] to provide your oncology and hematology care.   If you have a lab appointment with the [facility], please go directly to the [facility] and check in at the registration area.   Wear comfortable clothing and clothing appropriate for easy access to any Portacath or PICC line.   We strive to give you quality time with your provider. You may need to reschedule your appointment if you arrive late (15 or more minutes).  Arriving late affects you and other patients whose appointments are after yours.  Also, if you miss three or more appointments without notifying the office, you may be dismissed from the clinic at the provider's discretion.      For prescription refill requests, have your pharmacy contact our office and allow 72 hours for refills to be completed.    Today you received the following chemotherapy and/or immunotherapy agents : Kadcyla      To help prevent nausea and vomiting after your treatment, we encourage you to take your nausea medication as directed.  BELOW ARE SYMPTOMS THAT SHOULD BE REPORTED IMMEDIATELY: *FEVER GREATER THAN 100.4 F (38 C) OR HIGHER *CHILLS OR SWEATING *NAUSEA AND VOMITING THAT IS NOT CONTROLLED WITH YOUR NAUSEA MEDICATION *UNUSUAL SHORTNESS OF BREATH *UNUSUAL BRUISING OR BLEEDING *URINARY PROBLEMS (pain or burning when urinating, or frequent urination) *BOWEL PROBLEMS (unusual diarrhea, constipation, pain near the anus) TENDERNESS IN MOUTH AND THROAT WITH OR WITHOUT PRESENCE OF ULCERS (sore throat, sores in mouth, or a toothache) UNUSUAL RASH, SWELLING OR PAIN  UNUSUAL VAGINAL DISCHARGE OR ITCHING   Items with * indicate a potential emergency and should be followed up as soon as possible or go to the Emergency Department if any problems should occur.  Please show the CHEMOTHERAPY ALERT CARD or IMMUNOTHERAPY ALERT CARD at check-in to  the Emergency Department and triage nurse.  Should you have questions after your visit or need to cancel or reschedule your appointment, please contact [facility]  Dept: [phone]  and follow the prompts.  Office hours are 8:00 a.m. to 4:30 p.m. Monday - Friday. Please note that voicemails left after 4:00 p.m. may not be returned until the following business day.  We are closed weekends and major holidays. You have access to a nurse at all times for urgent questions. Please call the main number to the clinic Dept: [phone] and follow the prompts.   For any non-urgent questions, you may also contact your provider using MyChart. We now offer e-Visits for anyone 56 and older to request care online for non-urgent symptoms. For details visit [email or web address].   Also download the MyChart app! Go to the app store, search "MyChart", open the app, select [facility], and log in with your MyChart username and password.  Masks are optional in the cancer centers. If you would like for your care team to wear a mask while they are taking care of you, please let them know. You may have one support person who is at least 81 years old accompany you for your appointments.

## 2021-12-15 NOTE — Progress Notes (Signed)
Okay to treat with creat 1.58 per Dr. Burr Medico

## 2021-12-16 LAB — CANCER ANTIGEN 27.29: CA 27.29: 29.2 U/mL (ref 0.0–38.6)

## 2021-12-23 ENCOUNTER — Other Ambulatory Visit: Payer: Self-pay

## 2021-12-29 ENCOUNTER — Other Ambulatory Visit: Payer: Self-pay

## 2022-01-06 ENCOUNTER — Inpatient Hospital Stay: Payer: Medicare Other

## 2022-01-06 ENCOUNTER — Inpatient Hospital Stay: Payer: Medicare Other | Attending: Nurse Practitioner

## 2022-01-06 ENCOUNTER — Other Ambulatory Visit: Payer: Self-pay

## 2022-01-06 ENCOUNTER — Inpatient Hospital Stay (HOSPITAL_BASED_OUTPATIENT_CLINIC_OR_DEPARTMENT_OTHER): Payer: Medicare Other | Admitting: Hematology

## 2022-01-06 VITALS — BP 167/73 | HR 64 | Temp 98.0°F | Resp 16

## 2022-01-06 VITALS — BP 163/69 | HR 65 | Temp 98.1°F | Resp 18 | Ht 64.0 in | Wt 153.4 lb

## 2022-01-06 DIAGNOSIS — C7800 Secondary malignant neoplasm of unspecified lung: Secondary | ICD-10-CM | POA: Diagnosis not present

## 2022-01-06 DIAGNOSIS — Z79899 Other long term (current) drug therapy: Secondary | ICD-10-CM | POA: Insufficient documentation

## 2022-01-06 DIAGNOSIS — C773 Secondary and unspecified malignant neoplasm of axilla and upper limb lymph nodes: Secondary | ICD-10-CM | POA: Insufficient documentation

## 2022-01-06 DIAGNOSIS — M4726 Other spondylosis with radiculopathy, lumbar region: Secondary | ICD-10-CM | POA: Insufficient documentation

## 2022-01-06 DIAGNOSIS — I700 Atherosclerosis of aorta: Secondary | ICD-10-CM | POA: Insufficient documentation

## 2022-01-06 DIAGNOSIS — M5116 Intervertebral disc disorders with radiculopathy, lumbar region: Secondary | ICD-10-CM | POA: Diagnosis not present

## 2022-01-06 DIAGNOSIS — Z95828 Presence of other vascular implants and grafts: Secondary | ICD-10-CM

## 2022-01-06 DIAGNOSIS — M4800 Spinal stenosis, site unspecified: Secondary | ICD-10-CM | POA: Insufficient documentation

## 2022-01-06 DIAGNOSIS — Z171 Estrogen receptor negative status [ER-]: Secondary | ICD-10-CM | POA: Insufficient documentation

## 2022-01-06 DIAGNOSIS — N200 Calculus of kidney: Secondary | ICD-10-CM | POA: Insufficient documentation

## 2022-01-06 DIAGNOSIS — C50812 Malignant neoplasm of overlapping sites of left female breast: Secondary | ICD-10-CM | POA: Diagnosis not present

## 2022-01-06 DIAGNOSIS — R234 Changes in skin texture: Secondary | ICD-10-CM | POA: Diagnosis not present

## 2022-01-06 DIAGNOSIS — D509 Iron deficiency anemia, unspecified: Secondary | ICD-10-CM | POA: Diagnosis not present

## 2022-01-06 DIAGNOSIS — Z5112 Encounter for antineoplastic immunotherapy: Secondary | ICD-10-CM | POA: Diagnosis present

## 2022-01-06 DIAGNOSIS — C50412 Malignant neoplasm of upper-outer quadrant of left female breast: Secondary | ICD-10-CM | POA: Diagnosis not present

## 2022-01-06 DIAGNOSIS — I2510 Atherosclerotic heart disease of native coronary artery without angina pectoris: Secondary | ICD-10-CM | POA: Diagnosis not present

## 2022-01-06 DIAGNOSIS — J439 Emphysema, unspecified: Secondary | ICD-10-CM | POA: Insufficient documentation

## 2022-01-06 DIAGNOSIS — D649 Anemia, unspecified: Secondary | ICD-10-CM

## 2022-01-06 DIAGNOSIS — Z8673 Personal history of transient ischemic attack (TIA), and cerebral infarction without residual deficits: Secondary | ICD-10-CM | POA: Diagnosis not present

## 2022-01-06 LAB — CMP (CANCER CENTER ONLY)
ALT: 33 U/L (ref 0–44)
AST: 59 U/L — ABNORMAL HIGH (ref 15–41)
Albumin: 3.5 g/dL (ref 3.5–5.0)
Alkaline Phosphatase: 78 U/L (ref 38–126)
Anion gap: 6 (ref 5–15)
BUN: 19 mg/dL (ref 8–23)
CO2: 26 mmol/L (ref 22–32)
Calcium: 10.2 mg/dL (ref 8.9–10.3)
Chloride: 111 mmol/L (ref 98–111)
Creatinine: 1.6 mg/dL — ABNORMAL HIGH (ref 0.44–1.00)
GFR, Estimated: 32 mL/min — ABNORMAL LOW (ref >60)
Glucose, Bld: 101 mg/dL — ABNORMAL HIGH (ref 70–99)
Potassium: 3.7 mmol/L (ref 3.5–5.1)
Sodium: 143 mmol/L (ref 135–145)
Total Bilirubin: 0.5 mg/dL (ref 0.3–1.2)
Total Protein: 6.9 g/dL (ref 6.5–8.1)

## 2022-01-06 LAB — CBC WITH DIFFERENTIAL (CANCER CENTER ONLY)
Abs Immature Granulocytes: 0.01 10*3/uL (ref 0.00–0.07)
Basophils Absolute: 0.1 10*3/uL (ref 0.0–0.1)
Basophils Relative: 1 %
Eosinophils Absolute: 0.2 10*3/uL (ref 0.0–0.5)
Eosinophils Relative: 5 %
HCT: 28.9 % — ABNORMAL LOW (ref 36.0–46.0)
Hemoglobin: 9.7 g/dL — ABNORMAL LOW (ref 12.0–15.0)
Immature Granulocytes: 0 %
Lymphocytes Relative: 40 %
Lymphs Abs: 1.7 10*3/uL (ref 0.7–4.0)
MCH: 23.9 pg — ABNORMAL LOW (ref 26.0–34.0)
MCHC: 33.6 g/dL (ref 30.0–36.0)
MCV: 71.2 fL — ABNORMAL LOW (ref 80.0–100.0)
Monocytes Absolute: 0.3 10*3/uL (ref 0.1–1.0)
Monocytes Relative: 7 %
Neutro Abs: 2 10*3/uL (ref 1.7–7.7)
Neutrophils Relative %: 47 %
Platelet Count: 146 10*3/uL — ABNORMAL LOW (ref 150–400)
RBC: 4.06 MIL/uL (ref 3.87–5.11)
RDW: 16.6 % — ABNORMAL HIGH (ref 11.5–15.5)
WBC Count: 4.2 10*3/uL (ref 4.0–10.5)
nRBC: 0 % (ref 0.0–0.2)

## 2022-01-06 MED ORDER — SODIUM CHLORIDE 0.9 % IV SOLN
3.0000 mg/kg | Freq: Once | INTRAVENOUS | Status: AC
  Administered 2022-01-06: 220 mg via INTRAVENOUS
  Filled 2022-01-06: qty 8

## 2022-01-06 MED ORDER — ACETAMINOPHEN 325 MG PO TABS
650.0000 mg | ORAL_TABLET | Freq: Once | ORAL | Status: AC
  Administered 2022-01-06: 650 mg via ORAL
  Filled 2022-01-06: qty 2

## 2022-01-06 MED ORDER — SODIUM CHLORIDE 0.9% FLUSH
10.0000 mL | Freq: Once | INTRAVENOUS | Status: AC
  Administered 2022-01-06: 10 mL

## 2022-01-06 MED ORDER — SODIUM CHLORIDE 0.9 % IV SOLN: Freq: Once | INTRAVENOUS | Status: AC

## 2022-01-06 MED ORDER — HEPARIN SOD (PORK) LOCK FLUSH 100 UNIT/ML IV SOLN
500.0000 [IU] | Freq: Once | INTRAVENOUS | Status: AC | PRN
  Administered 2022-01-06: 500 [IU]

## 2022-01-06 MED ORDER — SODIUM CHLORIDE 0.9% FLUSH
10.0000 mL | INTRAVENOUS | Status: DC | PRN
  Administered 2022-01-06: 10 mL

## 2022-01-06 MED ORDER — DIPHENHYDRAMINE HCL 25 MG PO CAPS
50.0000 mg | ORAL_CAPSULE | Freq: Once | ORAL | Status: AC
  Administered 2022-01-06: 50 mg via ORAL
  Filled 2022-01-06: qty 2

## 2022-01-06 NOTE — Patient Instructions (Addendum)
[facility]  Discharge Instructions: Thank you for choosing [facility] to provide your oncology and hematology care.   If you have a lab appointment with the [facility], please go directly to the [facility] and check in at the registration area.   Wear comfortable clothing and clothing appropriate for easy access to any Portacath or PICC line.   We strive to give you quality time with your provider. You may need to reschedule your appointment if you arrive late (15 or more minutes).  Arriving late affects you and other patients whose appointments are after yours.  Also, if you miss three or more appointments without notifying the office, you may be dismissed from the clinic at the provider's discretion.      For prescription refill requests, have your pharmacy contact our office and allow 72 hours for refills to be completed.    Today you received the following chemotherapy and/or immunotherapy agent: Trastuzumab-Emtansine (Kadcyla)    To help prevent nausea and vomiting after your treatment, we encourage you to take your nausea medication as directed.  BELOW ARE SYMPTOMS THAT SHOULD BE REPORTED IMMEDIATELY: *FEVER GREATER THAN 100.4 F (38 C) OR HIGHER *CHILLS OR SWEATING *NAUSEA AND VOMITING THAT IS NOT CONTROLLED WITH YOUR NAUSEA MEDICATION *UNUSUAL SHORTNESS OF BREATH *UNUSUAL BRUISING OR BLEEDING *URINARY PROBLEMS (pain or burning when urinating, or frequent urination) *BOWEL PROBLEMS (unusual diarrhea, constipation, pain near the anus) TENDERNESS IN MOUTH AND THROAT WITH OR WITHOUT PRESENCE OF ULCERS (sore throat, sores in mouth, or a toothache) UNUSUAL RASH, SWELLING OR PAIN  UNUSUAL VAGINAL DISCHARGE OR ITCHING   Items with * indicate a potential emergency and should be followed up as soon as possible or go to the Emergency Department if any problems should occur.  Please show the CHEMOTHERAPY ALERT CARD or IMMUNOTHERAPY ALERT  CARD at check-in to the Emergency Department and triage nurse.  Should you have questions after your visit or need to cancel or reschedule your appointment, please contact [facility]  Dept: [phone]  and follow the prompts.  Office hours are 8:00 a.m. to 4:30 p.m. Monday - Friday. Please note that voicemails left after 4:00 p.m. may not be returned until the following business day.  We are closed weekends and major holidays. You have access to a nurse at all times for urgent questions. Please call the main number to the clinic Dept: [phone] and follow the prompts.   For any non-urgent questions, you may also contact your provider using MyChart. We now offer e-Visits for anyone 1 and older to request care online for non-urgent symptoms. For details visit [email or web address].   Also download the MyChart app! Go to the app store, search "MyChart", open the app, select [facility], and log in with your MyChart username and password.  Masks are optional in the cancer centers. If you would like for your care team to wear a mask while they are taking care of you, please let them know. You may have one support person who is at least 81 years old accompany you for your appointments. Ado-Trastuzumab Emtansine Injection What is this medication? ADO-TRASTUZUMAB EMTANSINE (ADD oh traz TOO zuh mab em TAN zine) treats breast cancer. It works by blocking a protein that causes cancer cells to grow and multiply. This helps to slow or stop the spread of cancer cells. This medicine may be used for other purposes; ask your health care provider or pharmacist if you  have questions. COMMON BRAND NAME(S): Kadcyla What should I tell my care team before I take this medication? They need to know if you have any of these conditions: Heart failure Liver disease Low platelet levels Lung disease Tingling of the fingers or toes or other nerve disorder An unusual or allergic reaction  to ado-trastuzumab emtansine, other medications, foods, dyes, or preservatives Pregnant or trying to get pregnant Breast-feeding How should I use this medication? This medication is infused into a vein. It is given by your care team in a hospital or clinic setting. Talk to your care team about the use of this medication in children. Special care may be needed. Overdosage: If you think you have taken too much of this medicine contact a poison control center or emergency room at once. NOTE: This medicine is only for you. Do not share this medicine with others. What if I miss a dose? Keep appointments for follow-up doses. It is important not to miss your dose. Call your care team if you are unable to keep an appointment. What may interact with this medication? Atazanavir Boceprevir Clarithromycin Dalfopristin; quinupristin Delavirdine Indinavir Isoniazid, INH Itraconazole Ketoconazole Nefazodone Nelfinavir Ritonavir Telaprevir Telithromycin Tipranavir Voriconazole This list may not describe all possible interactions. Give your health care provider a list of all the medicines, herbs, non-prescription drugs, or dietary supplements you use. Also tell them if you smoke, drink alcohol, or use illegal drugs. Some items may interact with your medicine. What should I watch for while using this medication? This medication may make you feel generally unwell. This is not uncommon, as chemotherapy can affect healthy cells as well as cancer cells. Report any side effects. Continue your course of treatment even though you feel ill unless your care team tells you to stop. You may need blood work while taking this medication. This medication may increase your risk to bruise or bleed. Call your care team if you notice any unusual bleeding. Be careful brushing or flossing your teeth or using a toothpick because you may get an infection or bleed more easily. If you have any dental work done, tell your  dentist you are receiving this medication. Talk to your care team if you may be pregnant. Serious birth defects can occur if you take this medication during pregnancy and for 7 months after the last dose. You will need a negative pregnancy test before starting this medication. Contraception is recommended while taking this medication and for 7 months after the last dose. Your care team can help you find the option that works for you. If your partner can get pregnant, use a condom during sex while taking this medication and for 4 months after the last dose. Do not breastfeed while taking this medication and for 7 months after the last dose. This medication may cause infertility. Talk to your care team if you are concerned with your fertility. What side effects may I notice from receiving this medication? Side effects that you should report to your care team as soon as possible: Allergic reactions--skin rash, itching, hives, swelling of the face, lips, tongue, or throat Bleeding--bloody or black, tar-like stools, vomiting blood or brown material that looks like coffee grounds, red or dark brown urine, small red or purple spots on skin, unusual bruising or bleeding Dry cough, shortness of breath or trouble breathing Heart failure--shortness of breath, swelling of the ankles, feet, or hands, sudden weight gain, unusual weakness or fatigue Infusion reactions--chest pain, shortness of breath or trouble breathing, feeling faint  or lightheaded Liver injury--right upper belly pain, loss of appetite, nausea, light-colored stool, dark yellow or brown urine, yellowing skin or eyes, unusual weakness or fatigue Pain, tingling, or numbness in the hands or feet Painful swelling, warmth, or redness of the skin, blisters or sores at the infusion site Side effects that usually do not require medical attention (report to your care team if they continue or are bothersome): Constipation Fatigue Headache Muscle  pain Nausea This list may not describe all possible side effects. Call your doctor for medical advice about side effects. You may report side effects to FDA at 1-800-FDA-1088. Where should I keep my medication? This medication is given in a hospital or clinic. It will not be stored at home. NOTE: This sheet is a summary. It may not cover all possible information. If you have questions about this medicine, talk to your doctor, pharmacist, or health care provider.  2023 Elsevier/Gold Standard (2021-07-09 00:00:00)

## 2022-01-06 NOTE — Progress Notes (Signed)
[facility]   [phone] [phone]   Clinic Follow up Note   Patient Care Team: Nolene Ebbs, MD as PCP - General (Internal Medicine) Truitt Merle, MD as Consulting Physician (Medical Oncology)  Date of Service:  01/06/2022  CHIEF COMPLAINT: f/u of metastatic breast cancer  CURRENT THERAPY:  Kadcyla every 3 weeks, started on 09/11/20   ASSESSMENT & PLAN:  Christy [name] is a 81 y.o. female with   1. 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 Work-up showed 2 masses in the left breast UOQ and 3 abnormal lymph nodes. Biopsy showed IDC to both masses and biopsied lymph node. -PET scan 08/28/20 showed: hypermetabolism to left breast and axilla, retropectoral and supraclavicular adenopathy, and several 3-6 mm pulmonary nodules. -baseline CA 27.29 was normal.  -baseline echo WNL with EF 55-60% -She started first-line Kadcyla on 09/11/20. She tolerates well with some fatigue, dose decreased with C4.  -She has had good clinical response, breast mass was no longer palpable on physical exam 06/09/21.  -restaging CT CAP 10/07/21 was stable, NED.  -repeat echo 12/07/21 was stable. -she is clinically doing well from a cancer standpoint and continues to tolerate treatment well overall. Labs reviewed, overall stable though her platelets have been slowly dropping. Will continue Kadcyla.  -plan for restaging PET to be done in 5-6 weeks.   2.  Microcytic anemia -Hemoglobin around 10, probably related to her underlying cancer -iron and ferritin were low but WNL on 08/11/21. -hgb stable at 9.7 today (01/06/22)   3. Spinal stenosis, Subacute stroke, H/o falls -spinal stenosis diagnosed while in [place] -subacute lacunar infarct seen on staging brain MRI 08/2020. -she ambulates with a walker  due to leg weakness, has received shots in her left hip.     PLAN: -proceed with Kadcyla at same dose reduction to 37m/kg -lab and Kadcyla on 11/22 as scheduled -PET scan to be done in 5-6 weeks, before her next f/u in 6 weeks   No problem-specific Assessment & Plan notes found for this encounter.   SUMMARY OF ONCOLOGIC HISTORY: Oncology History Overview Note  Cancer Staging Malignant neoplasm of upper-outer quadrant of female breast ([facility] Staging form: Breast, AJCC 8th Edition - Clinical stage from 07/31/2020: Stage IV (cT2, cN3, cM1, G2, ER-, PR-, HER2+) - Signed by BAlla Feeling NP on 08/31/2020 Stage prefix: Initial diagnosis Nuclear grade: G2 Histologic grading system: 3 grade system    Malignant neoplasm of upper-outer quadrant of female breast ([facility]  07/24/2020 Breast [place]  On physical exam, a very firm mass is identified at the 1:30 position of the LEFT breast 5 cm from the nipple.   IMPRESSION: 1. Highly suspicious 4.3 cm mass at the 1:30 position of the LEFT breast and highly suspicious 2.7 cm mass at the 2 o'clock position of the LEFT breast, with the 2 masses encompassing an area measuring at least 6.1 cm. 2. 3 abnormal LEFT axillary lymph nodes with cortical thickening, tissue sampling of 1 of these lymph nodes recommended. 3. Anterior LEFT breast skin thickening nonspecific but may reflect dermal involvement. 4. No mammographic evidence of RIGHT breast malignancy.   07/31/2020 Initial Biopsy   Diagnosis 1. Breast, left, needle core biopsy, 1 :30 pm, 5cmfn - INVASIVE DUCTAL CARCINOMA, SEE COMMENT. 2. Breast, left, needle core biopsy, 2 o'clock, 8cmfn -  INVASIVE DUCTAL CARCINOMA, SEE COMMENT. - DUCTAL CARCINOMA IN SITU. 3. Lymph node, needle/core biopsy, left axillary - METASTATIC CARCINOMA IN A LYMPH NODE. Microscopic Comment 1. and 3. The carcinoma in both parts has a similar morphology and appears grade 2-3. The DCIS in part 2 is high-grade with necrosis.  The carcinoma measures 15 mm (part 1) and 14 mm (part 2) in greatest linear extent  2. PROGNOSTIC INDICATORS Results: IMMUNOHISTOCHEMICAL AND MORPHOMETRIC ANALYSIS PERFORMED MANUALLY The tumor cells are POSITIVE for Her2 (3+). Estrogen Receptor: 0%, NEGATIVE Progesterone Receptor: 0%, NEGATIVE Proliferation Marker Ki67: 20%    07/31/2020 Cancer Staging   Staging form: Breast, AJCC 8th Edition - Clinical stage from 07/31/2020: Stage IV (cT2, cN3, cM1, G2, ER-, PR-, HER2+) - Signed by Alla Feeling, NP on 08/31/2020 Stage prefix: Initial diagnosis Nuclear grade: G2 Histologic grading system: 3 grade system   08/13/2020 Initial Diagnosis   Malignant neoplasm of upper-outer quadrant of female breast ([facility])   08/28/2020 PET scan   IMPRESSION: Signs of LEFT breast cancer with LEFT axillary and retropectoral adenopathy as well as LEFT supraclavicular adenopathy.   Signs of pulmonary metastatic disease.   Mild asymmetric uptake in the LEFT as compared to the RIGHT adrenal gland. Equivocal but suspicious, attention on follow-up.   Aortic atherosclerosis.   Cystic changes and potential bronchiectasis in the RIGHT lung base likely post infectious or inflammatory. Correlate with any respiratory symptoms. Comparison with prior imaging may be helpful with attention on follow-up.   08/28/2020 Imaging   BRAIN MRI IMPRESSION: 1. Subacute lacunar infarct of the left basal ganglia with no malignant hemorrhagic transformation or mass effect. Underlying chronic bilateral basal ganglia ischemia. 2. No metastatic disease or other acute intracranial abnormality identified.   08/28/2020 Imaging   Breast MRI   IMPRESSION: 1. 8.1 x 8.1 x 5.1 cm area of biopsy-proven malignancy in the central, upper outer and lower outer quadrants of the left breast. 2. Diffuse anterior left breast skin thickening without abnormal enhancement. This may represent thickening due to lymphedema associated with lymphatic  obstruction. 3. Metastatic level 1 and level 2 left axillary adenopathy. 4. No evidence of malignancy on the right.   09/11/2020 - 11/03/2021 Chemotherapy   Patient is on Treatment Plan : BREAST ADO-Trastuzumab Emtansine (Kadcyla) q21d     09/11/2020 -  Chemotherapy   Patient is on Treatment Plan : BREAST ADO-Trastuzumab Emtansine (Kadcyla) q21d     11/30/2020 Imaging   CT Chest w/o contrast  IMPRESSION: 1. Interval response to therapy. The left breast mass has mildly decreased in size in the interval. 2. Decrease in size of left axillary and left retropectoral lymph nodes. 3. Small right lung nodules stable to improved. 4. Coronary artery calcifications noted. 5. Aortic Atherosclerosis (ICD10-I70.0).   03/04/2021 PET scan   IMPRESSION: 1. Overall marked improvement compared to the prior PET-CT, with resolution of hypermetabolic activity and pathologic enlargement of the previously involved lymph nodes; lack of hypermetabolic activity and nonvisualization of prior right-sided lung nodules, and substantial reduction in size and activity in the left breast primary. 2. New ground-glass opacity medially in the right lower lobe with low-grade metabolic activity, highly likely to be inflammatory (alveolitis) given the morphology and appearance. 3. Other imaging findings of potential clinical significance: Aortic Atherosclerosis (ICD10-I70.0). Coronary atherosclerosis. Mitral valve calcifications. Punctate nonobstructive left renal calculi. Lower lumbar impingement due to chronic spondylosis and degenerative disc disease. Remote left basal ganglia infarct.   06/07/2021 Imaging   EXAM: CT CHEST, ABDOMEN AND PELVIS  WITHOUT CONTRAST  IMPRESSION: 1. Scattered tiny bilateral pulmonary nodules are similar to the prior exams, favored to be benign. 2. Otherwise, no evidence of metastatic disease within the chest, abdomen, or pelvis. 3. Right lower lobe ground-glass nodule has resolved since the  prior PET 4.  Possible constipation. 5. Coronary artery atherosclerosis. Aortic Atherosclerosis (ICD10-I70.0). Emphysema (ICD10-J43.9). 6. Left nephrolithiasis.      INTERVAL HISTORY:  Christy [name] is here for a follow up of metastatic breast cancer. She was last seen by me on 12/15/21. She presents to the clinic accompanied by her daughter. She reports things are stable, but her weight is down. I asked what she eats, and she replied "candy." Her daughter adds that she only eats little bits at a time. She feels her body is "withering away."  She also reports her hands stay "ice cold."   All other systems were reviewed with the patient and are negative.  MEDICAL HISTORY:  Past Medical History:  Diagnosis Date   Diabetes mellitus without complication ([place])    Hyperlipidemia    Hypertension     SURGICAL HISTORY: Past Surgical History:  Procedure Laterality Date   FOOT FRACTURE SURGERY Right    IR IMAGING GUIDED PORT INSERTION  09/22/2020   IR RADIOLOGIST EVAL & MGMT  09/25/2020    I have reviewed the social history and family history with the patient and they are unchanged from previous note.  ALLERGIES:  has No Known Allergies.  MEDICATIONS:  Current Outpatient Medications  Medication Sig Dispense Refill   amLODipine (NORVASC) 5 MG tablet  (Patient not taking: Reported on 09/17/2020)     aspirin EC 325 MG tablet Take 1 tablet (325 mg total) by mouth daily. 30 tablet 5   atorvastatin (LIPITOR) 20 MG tablet Take 1 tablet (20 mg total) by mouth daily. 60 tablet 3   cetirizine (ZYRTEC ALLERGY) 10 MG tablet Take 1 tablet (10 mg total) by mouth daily. (Patient not taking: Reported on 09/17/2020) 30 tablet 0   fluticasone (FLONASE) 50 MCG/ACT nasal spray Place 1 spray into both nostrils daily. (Patient not taking: Reported on 09/17/2020) 1 g 0   lidocaine-prilocaine (EMLA) cream Apply 1 application topically as needed. 30 g 2   metFORMIN (GLUCOPHAGE) 500 MG tablet Take 1 tablet  (500 mg total) by mouth 2 (two) times daily with a meal. 60 tablet 1   olmesartan (BENICAR) 40 MG tablet      No current facility-administered medications for this visit.   Facility-Administered Medications Ordered in Other Visits  Medication Dose Route Frequency Provider Last Rate Last Admin   sodium chloride flush (NS) 0.9 % injection 10 mL  10 mL Intracatheter PRN Truitt Merle, MD   10 mL at 01/06/22 1719    PHYSICAL EXAMINATION: ECOG PERFORMANCE STATUS: 1 - Symptomatic but completely ambulatory  Vitals:   01/06/22 1423  BP: (!) 163/69  Pulse: 65  Resp: 18  Temp: 98.1 F (36.7 C)  SpO2: 100%   Wt Readings from Last 3 Encounters:  01/06/22 153 lb 6.4 oz (69.6 kg)  12/15/21 169 lb 6.4 oz (76.8 kg)  11/25/21 160 lb 4.8 oz (72.7 kg)     GENERAL:alert, no distress and comfortable SKIN: skin color, texture, turgor are normal, no rashes or significant lesions EYES: normal, Conjunctiva are pink and non-injected, sclera clear  NECK: supple, thyroid normal size, non-tender, without nodularity LYMPH:  no palpable lymphadenopathy in the cervical, axillary  LUNGS: clear to auscultation and percussion with normal breathing effort  HEART: regular rate & rhythm and no murmurs and no lower extremity edema ABDOMEN:abdomen soft, non-tender and normal bowel sounds Musculoskeletal:no cyanosis of digits and no clubbing  NEURO: alert & oriented x 3 with fluent speech, no focal motor/sensory deficits BREAST: No palpable mass, nodules or adenopathy bilaterally. Breast exam benign.   LABORATORY DATA:  I have reviewed the data as listed    Latest Ref Rng & Units 01/06/2022    1:45 PM 12/15/2021   12:22 PM 11/25/2021   12:50 PM  CBC  WBC 4.0 - 10.5 K/uL 4.2  4.7  5.1   Hemoglobin 12.0 - 15.0 g/dL 9.7  9.8  9.3   Hematocrit 36.0 - 46.0 % 28.9  29.2  27.9   Platelets 150 - 400 K/uL 146  155  199         Latest Ref Rng & Units 01/06/2022    1:45 PM 12/15/2021   12:22 PM 11/25/2021   12:50 PM   CMP  Glucose 70 - 99 mg/dL 101  109  113   BUN 8 - 23 mg/dL _0 Creatinine 0.44 - 1.00 mg/dL 1.60  1.58  1.68   Sodium 135 - 145 mmol/L 143  143  143   Potassium 3.5 - 5.1 mmol/L 3.7  3.7  4.1   Chloride 98 - 111 mmol/L 111  110  112   CO2 22 - 32 mmol/L _1 Calcium 8.9 - 10.3 mg/dL 10.2  9.8  9.9   Total Protein 6.5 - 8.1 g/dL 6.9  6.9  6.8   Total Bilirubin 0.3 - 1.2 mg/dL 0.5  0.6  0.4   Alkaline Phos 38 - 126 U/L 78  84  99   AST 15 - 41 U/L 59  56  51   ALT 0 - 44 U/L 33  22  26       RADIOGRAPHIC STUDIES: I have personally reviewed the radiological images as listed and agreed with the findings in the report. No results found.    Orders Placed This Encounter  Procedures   NM PET Image Restage (PS) Skull Base to Thigh (F-18 FDG)    Standing Status:   Future    Standing Expiration Date:   02/14/2022    Order Specific Question:   If indicated for the ordered procedure, I authorize the administration of a radiopharmaceutical per Radiology protocol    Answer:   Yes    Order Specific Question:   Preferred imaging location?    Answer:   [facility]    Order Specific Question:   Radiology Contrast Protocol - do NOT remove file path    Answer:   [email or web address]   CBC with Differential (Cancer Center Only)    Standing Status:   Future    Standing Expiration Date:   02/18/2023   CMP ([facility] only)    Standing Status:   Future    Standing Expiration Date:   02/18/2023   All questions were answered. The patient knows to call the clinic with any problems, questions or concerns. No barriers to learning was detected. The total time spent in the appointment was 30 minutes.     Truitt Merle, MD 01/06/2022   I, Wilburn Mylar, am acting as scribe for Truitt Merle, MD.   I have reviewed the above documentation for accuracy and completeness, and I agree with the above.

## 2022-01-07 LAB — FERRITIN: Ferritin: 49 ng/mL (ref 11–307)

## 2022-01-08 ENCOUNTER — Other Ambulatory Visit: Payer: Self-pay

## 2022-01-11 ENCOUNTER — Other Ambulatory Visit: Payer: Self-pay

## 2022-01-14 ENCOUNTER — Other Ambulatory Visit: Payer: Self-pay

## 2022-01-26 ENCOUNTER — Other Ambulatory Visit: Payer: Self-pay

## 2022-01-26 ENCOUNTER — Inpatient Hospital Stay: Payer: Medicare Other

## 2022-01-26 VITALS — BP 178/87 | HR 74 | Temp 98.7°F | Resp 17 | Wt 154.8 lb

## 2022-01-26 DIAGNOSIS — C50812 Malignant neoplasm of overlapping sites of left female breast: Secondary | ICD-10-CM | POA: Diagnosis not present

## 2022-01-26 DIAGNOSIS — C50412 Malignant neoplasm of upper-outer quadrant of left female breast: Secondary | ICD-10-CM

## 2022-01-26 DIAGNOSIS — Z95828 Presence of other vascular implants and grafts: Secondary | ICD-10-CM

## 2022-01-26 LAB — CMP (CANCER CENTER ONLY)
ALT: 45 U/L — ABNORMAL HIGH (ref 0–44)
AST: 79 U/L — ABNORMAL HIGH (ref 15–41)
Albumin: 3.3 g/dL — ABNORMAL LOW (ref 3.5–5.0)
Alkaline Phosphatase: 80 U/L (ref 38–126)
Anion gap: 5 (ref 5–15)
BUN: 19 mg/dL (ref 8–23)
CO2: 25 mmol/L (ref 22–32)
Calcium: 10 mg/dL (ref 8.9–10.3)
Chloride: 112 mmol/L — ABNORMAL HIGH (ref 98–111)
Creatinine: 1.75 mg/dL — ABNORMAL HIGH (ref 0.44–1.00)
GFR, Estimated: 29 mL/min — ABNORMAL LOW (ref >60)
Glucose, Bld: 96 mg/dL (ref 70–99)
Potassium: 3.6 mmol/L (ref 3.5–5.1)
Sodium: 142 mmol/L (ref 135–145)
Total Bilirubin: 0.5 mg/dL (ref 0.3–1.2)
Total Protein: 6.9 g/dL (ref 6.5–8.1)

## 2022-01-26 LAB — CBC WITH DIFFERENTIAL (CANCER CENTER ONLY)
Abs Immature Granulocytes: 0.01 10*3/uL (ref 0.00–0.07)
Basophils Absolute: 0 10*3/uL (ref 0.0–0.1)
Basophils Relative: 0 %
Eosinophils Absolute: 0.2 10*3/uL (ref 0.0–0.5)
Eosinophils Relative: 5 %
HCT: 27.7 % — ABNORMAL LOW (ref 36.0–46.0)
Hemoglobin: 9.2 g/dL — ABNORMAL LOW (ref 12.0–15.0)
Immature Granulocytes: 0 %
Lymphocytes Relative: 41 %
Lymphs Abs: 1.9 10*3/uL (ref 0.7–4.0)
MCH: 24.1 pg — ABNORMAL LOW (ref 26.0–34.0)
MCHC: 33.2 g/dL (ref 30.0–36.0)
MCV: 72.7 fL — ABNORMAL LOW (ref 80.0–100.0)
Monocytes Absolute: 0.4 10*3/uL (ref 0.1–1.0)
Monocytes Relative: 8 %
Neutro Abs: 2.1 10*3/uL (ref 1.7–7.7)
Neutrophils Relative %: 46 %
Platelet Count: 158 10*3/uL (ref 150–400)
RBC: 3.81 MIL/uL — ABNORMAL LOW (ref 3.87–5.11)
RDW: 17.4 % — ABNORMAL HIGH (ref 11.5–15.5)
WBC Count: 4.7 10*3/uL (ref 4.0–10.5)
nRBC: 0 % (ref 0.0–0.2)

## 2022-01-26 MED ORDER — DIPHENHYDRAMINE HCL 25 MG PO CAPS
50.0000 mg | ORAL_CAPSULE | Freq: Once | ORAL | Status: AC
  Administered 2022-01-26: 50 mg via ORAL
  Filled 2022-01-26: qty 2

## 2022-01-26 MED ORDER — ACETAMINOPHEN 325 MG PO TABS
650.0000 mg | ORAL_TABLET | Freq: Once | ORAL | Status: AC
  Administered 2022-01-26: 650 mg via ORAL
  Filled 2022-01-26: qty 2

## 2022-01-26 MED ORDER — HEPARIN SOD (PORK) LOCK FLUSH 100 UNIT/ML IV SOLN
500.0000 [IU] | Freq: Once | INTRAVENOUS | Status: AC | PRN
  Administered 2022-01-26: 500 [IU]

## 2022-01-26 MED ORDER — SODIUM CHLORIDE 0.9 % IV SOLN: Freq: Once | INTRAVENOUS | Status: AC

## 2022-01-26 MED ORDER — SODIUM CHLORIDE 0.9 % IV SOLN
3.0000 mg/kg | Freq: Once | INTRAVENOUS | Status: AC
  Administered 2022-01-26: 220 mg via INTRAVENOUS
  Filled 2022-01-26: qty 5

## 2022-01-26 MED ORDER — SODIUM CHLORIDE 0.9% FLUSH
10.0000 mL | INTRAVENOUS | Status: DC | PRN
  Administered 2022-01-26: 10 mL

## 2022-01-26 MED ORDER — SODIUM CHLORIDE 0.9% FLUSH
10.0000 mL | Freq: Once | INTRAVENOUS | Status: AC
  Administered 2022-01-26: 10 mL

## 2022-01-26 NOTE — Patient Instructions (Signed)
**Note Christy-Identified via Obfuscation** Christy [name] ONCOLOGY  Discharge Instructions: Thank you for choosing [facility] to provide your oncology and hematology care.   If you have a lab appointment with the [facility], please go directly to the [facility] and check in at the registration area.   Wear comfortable clothing and clothing appropriate for easy access to any Portacath or PICC line.   We strive to give you quality time with your provider. You may need to reschedule your appointment if you arrive late (15 or more minutes).  Arriving late affects you and other patients whose appointments are after yours.  Also, if you miss three or more appointments without notifying the office, you may be dismissed from the clinic at the provider's discretion.      For prescription refill requests, have your pharmacy contact our office and allow 72 hours for refills to be completed.    Today you received the following chemotherapy and/or immunotherapy agent: Trastuzumab-Emtansine (Kadcyla)    To help prevent nausea and vomiting after your treatment, we encourage you to take your nausea medication as directed.  BELOW ARE SYMPTOMS THAT SHOULD BE REPORTED IMMEDIATELY: *FEVER GREATER THAN 100.4 F (38 C) OR HIGHER *CHILLS OR SWEATING *NAUSEA AND VOMITING THAT IS NOT CONTROLLED WITH YOUR NAUSEA MEDICATION *UNUSUAL SHORTNESS OF BREATH *UNUSUAL BRUISING OR BLEEDING *URINARY PROBLEMS (pain or burning when urinating, or frequent urination) *BOWEL PROBLEMS (unusual diarrhea, constipation, pain near the anus) TENDERNESS IN MOUTH AND THROAT WITH OR WITHOUT PRESENCE OF ULCERS (sore throat, sores in mouth, or a toothache) UNUSUAL RASH, SWELLING OR PAIN  UNUSUAL VAGINAL DISCHARGE OR ITCHING   Items with * indicate a potential emergency and should be followed up as soon as possible or go to the Emergency Department if any problems should occur.  Please show the CHEMOTHERAPY ALERT CARD or IMMUNOTHERAPY ALERT  CARD at check-in to the Emergency Department and triage nurse.  Should you have questions after your visit or need to cancel or reschedule your appointment, please contact [facility]  Dept: [phone]  and follow the prompts.  Office hours are 8:00 a.m. to 4:30 p.m. Monday - Friday. Please note that voicemails left after 4:00 p.m. may not be returned until the following business day.  We are closed weekends and major holidays. You have access to a nurse at all times for urgent questions. Please call the main number to the clinic Dept: [phone] and follow the prompts.   For any non-urgent questions, you may also contact your provider using MyChart. We now offer e-Visits for anyone 27 and older to request care online for non-urgent symptoms. For details visit [email or web address].   Also download the MyChart app! Go to the app store, search "MyChart", open the app, select [facility], and log in with your MyChart username and password.  Masks are optional in the cancer centers. If you would like for your care team to wear a mask while they are taking care of you, please let them know. You may have one support person who is at least 81 years old accompany you for your appointments. Ado-Trastuzumab Emtansine Injection What is this medication? ADO-TRASTUZUMAB EMTANSINE (ADD oh traz TOO zuh mab em TAN zine) treats breast cancer. It works by blocking a protein that causes cancer cells to grow and multiply. This helps to slow or stop the spread of cancer cells. This medicine may be used for other purposes; ask your health care provider or pharmacist if you  have questions. COMMON BRAND NAME(S): Kadcyla What should I tell my care team before I take this medication? They need to know if you have any of these conditions: Heart failure Liver disease Low platelet levels Lung disease Tingling of the fingers or toes or other nerve disorder An unusual or allergic reaction  to ado-trastuzumab emtansine, other medications, foods, dyes, or preservatives Pregnant or trying to get pregnant Breast-feeding How should I use this medication? This medication is infused into a vein. It is given by your care team in a hospital or clinic setting. Talk to your care team about the use of this medication in children. Special care may be needed. Overdosage: If you think you have taken too much of this medicine contact a poison control center or emergency room at once. NOTE: This medicine is only for you. Do not share this medicine with others. What if I miss a dose? Keep appointments for follow-up doses. It is important not to miss your dose. Call your care team if you are unable to keep an appointment. What may interact with this medication? Atazanavir Boceprevir Clarithromycin Dalfopristin; quinupristin Delavirdine Indinavir Isoniazid, INH Itraconazole Ketoconazole Nefazodone Nelfinavir Ritonavir Telaprevir Telithromycin Tipranavir Voriconazole This list may not describe all possible interactions. Give your health care provider a list of all the medicines, herbs, non-prescription drugs, or dietary supplements you use. Also tell them if you smoke, drink alcohol, or use illegal drugs. Some items may interact with your medicine. What should I watch for while using this medication? This medication may make you feel generally unwell. This is not uncommon, as chemotherapy can affect healthy cells as well as cancer cells. Report any side effects. Continue your course of treatment even though you feel ill unless your care team tells you to stop. You may need blood work while taking this medication. This medication may increase your risk to bruise or bleed. Call your care team if you notice any unusual bleeding. Be careful brushing or flossing your teeth or using a toothpick because you may get an infection or bleed more easily. If you have any dental work done, tell your  dentist you are receiving this medication. Talk to your care team if you may be pregnant. Serious birth defects can occur if you take this medication during pregnancy and for 7 months after the last dose. You will need a negative pregnancy test before starting this medication. Contraception is recommended while taking this medication and for 7 months after the last dose. Your care team can help you find the option that works for you. If your partner can get pregnant, use a condom during sex while taking this medication and for 4 months after the last dose. Do not breastfeed while taking this medication and for 7 months after the last dose. This medication may cause infertility. Talk to your care team if you are concerned with your fertility. What side effects may I notice from receiving this medication? Side effects that you should report to your care team as soon as possible: Allergic reactions--skin rash, itching, hives, swelling of the face, lips, tongue, or throat Bleeding--bloody or black, tar-like stools, vomiting blood or brown material that looks like coffee grounds, red or dark brown urine, small red or purple spots on skin, unusual bruising or bleeding Dry cough, shortness of breath or trouble breathing Heart failure--shortness of breath, swelling of the ankles, feet, or hands, sudden weight gain, unusual weakness or fatigue Infusion reactions--chest pain, shortness of breath or trouble breathing, feeling faint  or lightheaded Liver injury--right upper belly pain, loss of appetite, nausea, light-colored stool, dark yellow or brown urine, yellowing skin or eyes, unusual weakness or fatigue Pain, tingling, or numbness in the hands or feet Painful swelling, warmth, or redness of the skin, blisters or sores at the infusion site Side effects that usually do not require medical attention (report to your care team if they continue or are bothersome): Constipation Fatigue Headache Muscle  pain Nausea This list may not describe all possible side effects. Call your doctor for medical advice about side effects. You may report side effects to FDA at 1-800-FDA-1088. Where should I keep my medication? This medication is given in a hospital or clinic. It will not be stored at home. NOTE: This sheet is a summary. It may not cover all possible information. If you have questions about this medicine, talk to your doctor, pharmacist, or health care provider.  2023 Elsevier/Gold Standard (2021-07-09 00:00:00)

## 2022-01-27 LAB — CANCER ANTIGEN 27.29: CA 27.29: 23.5 U/mL (ref 0.0–38.6)

## 2022-01-28 ENCOUNTER — Inpatient Hospital Stay: Payer: Medicare Other

## 2022-01-28 ENCOUNTER — Ambulatory Visit (HOSPITAL_COMMUNITY)
Admission: RE | Admit: 2022-01-28 | Discharge: 2022-01-28 | Disposition: A | Discharge status: Home / Self Care | Payer: Medicare Other | Source: Ambulatory Visit | Attending: Hematology | Admitting: Hematology

## 2022-01-28 DIAGNOSIS — C50412 Malignant neoplasm of upper-outer quadrant of left female breast: Secondary | ICD-10-CM | POA: Diagnosis present

## 2022-01-28 DIAGNOSIS — Z171 Estrogen receptor negative status [ER-]: Secondary | ICD-10-CM | POA: Diagnosis present

## 2022-01-28 LAB — GLUCOSE, CAPILLARY: Glucose-Capillary: 83 mg/dL (ref 70–99)

## 2022-01-28 MED ORDER — FLUDEOXYGLUCOSE F - 18 (FDG) INJECTION
7.6900 | Freq: Once | INTRAVENOUS | Status: AC | PRN
  Administered 2022-01-28: 7.69 via INTRAVENOUS

## 2022-02-15 NOTE — Assessment & Plan Note (Addendum)
[id number] stage IV, grade 2-3, ER-/PR- HER2+, with nodal and pulmonary metastases -She initially self palpated a left breast mass around 06/2020. Work-up showed 2 masses in the left breast UOQ and 3 abnormal lymph nodes. Biopsy showed IDC to both masses and biopsied lymph node. -PET scan 08/28/20 showed: hypermetabolism to left breast and axilla, retropectoral and supraclavicular adenopathy, and several 3-6 mm pulmonary nodules. -She started first-line Kadcyla on 09/11/20. She tolerates well with some fatigue, dose decreased with C4.  -She has had good clinical response, breast mass was no longer palpable on physical exam 06/09/21.  -restaging CT CAP 10/07/21 was stable, NED. -PET scan showed two small lung nodule (5-3m), slightly bigger since last scan, no other hypermetabolic lesions. Will continue Kadcyla

## 2022-02-15 NOTE — Progress Notes (Unsigned)
[facility]   Telephone:([phone]) [phone] [phone]) [phone]   Clinic Follow up Note   Patient Care Team: Nolene Ebbs, MD as PCP - General (Internal Medicine) Truitt Merle, MD as Consulting Physician (Medical Oncology)  Date of Service:  02/16/2022  CHIEF COMPLAINT: f/u of metastatic breast cancer   CURRENT THERAPY:  Kadcyla every 3 weeks, started on 09/11/20   ASSESSMENT:  Christy [name] is a 81 y.o. female with   Malignant neoplasm of upper-outer quadrant of female breast ([place]) cT2N3M1 stage IV, grade 2-3, ER-/PR- HER2+, with nodal and pulmonary metastases -She initially self palpated a left breast mass around 06/2020. Work-up showed 2 masses in the left breast UOQ and 3 abnormal lymph nodes. Biopsy showed IDC to both masses and biopsied lymph node. -PET scan 08/28/20 showed: hypermetabolism to left breast and axilla, retropectoral and supraclavicular adenopathy, and several 3-6 mm pulmonary nodules. -She started first-line Kadcyla on 09/11/20. She tolerates well with some fatigue, dose decreased with C4.  -She has had good clinical response, breast mass was no longer palpable on physical exam 06/09/21.  -restaging CT CAP 10/07/21 was stable, NED. -PET scan showed two small lung nodule (5-42m), slightly bigger since last scan, no other hypermetabolic lesions. Will continue Kadcyla   [place]rebrum[place]subacute lacunar infarct seen on staging brain MRI 08/2020. -she ambulates with a walker due to leg weakness, has received shots in her left hip.   PLAN: -I reviewed her PET scan images and discussed the findings with patient and her daughter on the phone -Will continue KSikestoday and every 3 weeks -Follow-up in 6 weeks  SUMMARY OF ONCOLOGIC HISTORY: Oncology History Overview Note  Cancer Staging Malignant neoplasm of upper-outer quadrant of female breast ([place] Staging form: Breast, AJCC 8th Edition - Clinical stage from  07/31/2020: Stage IV (cT2, cN3, cM1, G2, ER-, PR-, HER2+) - Signed by BAlla Feeling NP on 08/31/2020 Stage prefix: Initial diagnosis Nuclear grade: G2 Histologic grading system: 3 grade system    Malignant neoplasm of upper-outer quadrant of female breast ([place]  07/24/2020 Breast [place]  On physical exam, a very firm mass is identified at the 1:30 position of the LEFT breast 5 cm from the nipple.   IMPRESSION: 1. Highly suspicious 4.3 cm mass at the 1:30 position of the LEFT breast and highly suspicious 2.7 cm mass at the 2 o'clock position of the LEFT breast, with the 2 masses encompassing an area measuring at least 6.1 cm. 2. 3 abnormal LEFT axillary lymph nodes with cortical thickening, tissue sampling of 1 of these lymph nodes recommended. 3. Anterior LEFT breast skin thickening nonspecific but may reflect dermal involvement. 4. No mammographic evidence of RIGHT breast malignancy.   07/31/2020 Initial Biopsy   Diagnosis 1. Breast, left, needle core biopsy, 1 :30 pm, 5cmfn - INVASIVE DUCTAL CARCINOMA, SEE COMMENT. 2. Breast, left, needle core biopsy, 2 o'clock, 8cmfn - INVASIVE DUCTAL CARCINOMA, SEE COMMENT. - DUCTAL CARCINOMA IN SITU. 3. Lymph node, needle/core biopsy, left axillary - METASTATIC CARCINOMA IN A LYMPH NODE. Microscopic Comment 1. and 3. The carcinoma in both parts has a similar morphology and appears grade 2-3. The DCIS in part 2 is high-grade with necrosis. The carcinoma measures 15 mm (part 1) and 14 mm (part 2) in greatest linear extent  2. PROGNOSTIC INDICATORS Results: IMMUNOHISTOCHEMICAL AND MORPHOMETRIC ANALYSIS PERFORMED MANUALLY The tumor cells are POSITIVE for Her2 (3+). Estrogen Receptor: 0%, NEGATIVE Progesterone Receptor: 0%, NEGATIVE Proliferation  Marker Ki67: 20%    07/31/2020 Cancer Staging   Staging form: Breast, AJCC 8th Edition - Clinical stage from 07/31/2020: Stage IV (cT2, cN3, cM1, G2, ER-, PR-, HER2+) - Signed by Alla Feeling, NP on  08/31/2020 Stage prefix: Initial diagnosis Nuclear grade: G2 Histologic grading system: 3 grade system   08/13/2020 Initial Diagnosis   Malignant neoplasm of upper-outer quadrant of female breast ([place])   08/28/2020 PET scan   IMPRESSION: Signs of LEFT breast cancer with LEFT axillary and retropectoral adenopathy as well as LEFT supraclavicular adenopathy.   Signs of pulmonary metastatic disease.   Mild asymmetric uptake in the LEFT as compared to the RIGHT adrenal gland. Equivocal but suspicious, attention on follow-up.   Aortic atherosclerosis.   Cystic changes and potential bronchiectasis in the RIGHT lung base likely post infectious or inflammatory. Correlate with any respiratory symptoms. Comparison with prior imaging may be helpful with attention on follow-up.   08/28/2020 Imaging   BRAIN MRI IMPRESSION: 1. Subacute lacunar infarct of the left basal ganglia with no malignant hemorrhagic transformation or mass effect. Underlying chronic bilateral basal ganglia ischemia. 2. No metastatic disease or other acute intracranial abnormality identified.   08/28/2020 Imaging   Breast MRI   IMPRESSION: 1. 8.1 x 8.1 x 5.1 cm area of biopsy-proven malignancy in the central, upper outer and lower outer quadrants of the left breast. 2. Diffuse anterior left breast skin thickening without abnormal enhancement. This may represent thickening due to lymphedema associated with lymphatic obstruction. 3. Metastatic level 1 and level 2 left axillary adenopathy. 4. No evidence of malignancy on the right.   09/11/2020 - 11/03/2021 Chemotherapy   Patient is on Treatment Plan : BREAST ADO-Trastuzumab Emtansine (Kadcyla) q21d     09/11/2020 -  Chemotherapy   Patient is on Treatment Plan : BREAST ADO-Trastuzumab Emtansine (Kadcyla) q21d     11/30/2020 Imaging   CT Chest w/o contrast  IMPRESSION: 1. Interval response to therapy. The left breast mass has mildly decreased in size in the interval. 2.  Decrease in size of left axillary and left retropectoral lymph nodes. 3. Small right lung nodules stable to improved. 4. Coronary artery calcifications noted. 5. Aortic Atherosclerosis (ICD10-I70.0).   03/04/2021 PET scan   IMPRESSION: 1. Overall marked improvement compared to the prior PET-CT, with resolution of hypermetabolic activity and pathologic enlargement of the previously involved lymph nodes; lack of hypermetabolic activity and nonvisualization of prior right-sided lung nodules, and substantial reduction in size and activity in the left breast primary. 2. New ground-glass opacity medially in the right lower lobe with low-grade metabolic activity, highly likely to be inflammatory (alveolitis) given the morphology and appearance. 3. Other imaging findings of potential clinical significance: Aortic Atherosclerosis (ICD10-I70.0). Coronary atherosclerosis. Mitral valve calcifications. Punctate nonobstructive left renal calculi. Lower lumbar impingement due to chronic spondylosis and degenerative disc disease. Remote left basal ganglia infarct.   06/07/2021 Imaging   EXAM: CT CHEST, ABDOMEN AND PELVIS WITHOUT CONTRAST  IMPRESSION: 1. Scattered tiny bilateral pulmonary nodules are similar to the prior exams, favored to be benign. 2. Otherwise, no evidence of metastatic disease within the chest, abdomen, or pelvis. 3. Right lower lobe ground-glass nodule has resolved since the prior PET 4.  Possible constipation. 5. Coronary artery atherosclerosis. Aortic Atherosclerosis (ICD10-I70.0). Emphysema (ICD10-J43.9). 6. Left nephrolithiasis.      INTERVAL HISTORY:  Christy [name] is here for a follow up of metastatic breast cancer  She was last seen by me on 01/06/2022 She presents to the  clinic alone, and I called her daughter during her visit.  Clinically stable, still has mild fatigue, able to function at home.  She noticed mild occasional left axilla and breast discomfort, no other  complaints.   All other systems were reviewed with the patient and are negative.  MEDICAL HISTORY:  Past Medical History:  Diagnosis Date   Diabetes mellitus without complication ([place])    Hyperlipidemia    Hypertension     SURGICAL HISTORY: Past Surgical History:  Procedure Laterality Date   FOOT FRACTURE SURGERY Right    IR IMAGING GUIDED PORT INSERTION  09/22/2020   IR RADIOLOGIST EVAL & MGMT  09/25/2020    I have reviewed the social history and family history with the patient and they are unchanged from previous note.  ALLERGIES:  has No Known Allergies.  MEDICATIONS:  Current Outpatient Medications  Medication Sig Dispense Refill   amLODipine (NORVASC) 5 MG tablet  (Patient not taking: Reported on 09/17/2020)     aspirin EC 325 MG tablet Take 1 tablet (325 mg total) by mouth daily. 30 tablet 5   atorvastatin (LIPITOR) 20 MG tablet Take 1 tablet (20 mg total) by mouth daily. 60 tablet 3   cetirizine (ZYRTEC ALLERGY) 10 MG tablet Take 1 tablet (10 mg total) by mouth daily. (Patient not taking: Reported on 09/17/2020) 30 tablet 0   fluticasone (FLONASE) 50 MCG/ACT nasal spray Place 1 spray into both nostrils daily. (Patient not taking: Reported on 09/17/2020) 1 g 0   lidocaine-prilocaine (EMLA) cream Apply 1 application topically as needed. 30 g 2   metFORMIN (GLUCOPHAGE) 500 MG tablet Take 1 tablet (500 mg total) by mouth 2 (two) times daily with a meal. 60 tablet 1   olmesartan (BENICAR) 40 MG tablet      No current facility-administered medications for this visit.    PHYSICAL EXAMINATION: ECOG PERFORMANCE STATUS: 1 - Symptomatic but completely ambulatory  Vitals:   02/16/22 1306  BP: (!) 153/64  Pulse: 65  Resp: 17  Temp: 98.1 F (36.7 C)  SpO2: 100%   Wt Readings from Last 3 Encounters:  02/16/22 154 lb 4.8 oz (70 kg)  01/26/22 154 lb 12 oz (70.2 kg)  01/06/22 153 lb 6.4 oz (69.6 kg)     GENERAL:alert, no distress and comfortable SKIN: skin color, texture,  turgor are normal, no rashes or significant lesions EYES: normal, Conjunctiva are pink and non-injected, sclera clear NECK: supple, thyroid normal size, non-tender, without nodularity LYMPH:  no palpable lymphadenopathy in the cervical, axillary  LUNGS: clear to auscultation and percussion with normal breathing effort HEART: regular rate & rhythm and no murmurs and no lower extremity edema ABDOMEN:abdomen soft, non-tender and normal bowel sounds Musculoskeletal:no cyanosis of digits and no clubbing  NEURO: alert & oriented x 3 with fluent speech, no focal motor/sensory deficits BREASTS: Breasts: Breast inspection showed them to be symmetrical with no nipple discharge. Palpation of the breasts and axilla revealed no obvious mass that I could appreciate.   LABORATORY DATA:  I have reviewed the data as listed    Latest Ref Rng & Units 02/16/2022   12:39 PM 01/26/2022   12:53 PM 01/06/2022    1:45 PM  CBC  WBC 4.0 - 10.5 K/uL 4.5  4.7  4.2   Hemoglobin 12.0 - 15.0 g/dL 9.3  9.2  9.7   Hematocrit 36.0 - 46.0 % 27.8  27.7  28.9   Platelets 150 - 400 K/uL 190  158  146  Latest Ref Rng & Units 02/16/2022   12:39 PM 01/26/2022   12:53 PM 01/06/2022    1:45 PM  CMP  Glucose 70 - 99 mg/dL 103  96  101   BUN 8 - 23 mg/dL _0 Creatinine 0.44 - 1.00 mg/dL 1.64  1.75  1.60   Sodium 135 - 145 mmol/L 143  142  143   Potassium 3.5 - 5.1 mmol/L 3.7  3.6  3.7   Chloride 98 - 111 mmol/L 112  112  111   CO2 22 - 32 mmol/L _1 Calcium 8.9 - 10.3 mg/dL 10.7  10.0  10.2   Total Protein 6.5 - 8.1 g/dL 6.6  6.9  6.9   Total Bilirubin 0.3 - 1.2 mg/dL 0.6  0.5  0.5   Alkaline Phos 38 - 126 U/L 85  80  78   AST 15 - 41 U/L 44  79  59   ALT 0 - 44 U/L 22  45  33       RADIOGRAPHIC STUDIES: I have personally reviewed the radiological images as listed and agreed with the findings in the report. No results found.    Orders Placed This Encounter  Procedures   CBC with  Differential ([facility] Only)    Standing Status:   Future    Standing Expiration Date:   03/10/2023   CMP ([facility] only)    Standing Status:   Future    Standing Expiration Date:   03/10/2023   CBC with Differential ([facility] Only)    Standing Status:   Future    Standing Expiration Date:   03/31/2023   CMP ([facility] only)    Standing Status:   Future    Standing Expiration Date:   03/31/2023   All questions were answered. The patient knows to call the clinic with any problems, questions or concerns. No barriers to learning was detected. The total time spent in the appointment was 30 minutes.     Truitt Merle, MD 02/16/2022   Felicity Coyer, CMA, am acting as scribe for Truitt Merle, MD.   I have reviewed the above documentation for accuracy and completeness, and I agree with the above.

## 2022-02-15 NOTE — Assessment & Plan Note (Signed)
-  spinal stenosis diagnosed while in [place] -subacute lacunar infarct seen on staging brain MRI 08/2020. -she ambulates with a walker due to leg weakness, has received shots in her left hip.

## 2022-02-16 ENCOUNTER — Inpatient Hospital Stay: Payer: Medicare Other | Attending: Nurse Practitioner

## 2022-02-16 ENCOUNTER — Inpatient Hospital Stay: Payer: Medicare Other

## 2022-02-16 ENCOUNTER — Other Ambulatory Visit: Payer: Self-pay

## 2022-02-16 ENCOUNTER — Inpatient Hospital Stay (HOSPITAL_BASED_OUTPATIENT_CLINIC_OR_DEPARTMENT_OTHER): Payer: Medicare Other | Admitting: Hematology

## 2022-02-16 ENCOUNTER — Encounter: Payer: Self-pay | Admitting: Hematology

## 2022-02-16 VITALS — BP 153/64 | HR 65 | Temp 98.1°F | Resp 17 | Ht 64.0 in | Wt 154.3 lb

## 2022-02-16 DIAGNOSIS — J439 Emphysema, unspecified: Secondary | ICD-10-CM | POA: Insufficient documentation

## 2022-02-16 DIAGNOSIS — C779 Secondary and unspecified malignant neoplasm of lymph node, unspecified: Secondary | ICD-10-CM | POA: Insufficient documentation

## 2022-02-16 DIAGNOSIS — Z171 Estrogen receptor negative status [ER-]: Secondary | ICD-10-CM | POA: Insufficient documentation

## 2022-02-16 DIAGNOSIS — N200 Calculus of kidney: Secondary | ICD-10-CM | POA: Insufficient documentation

## 2022-02-16 DIAGNOSIS — Z79899 Other long term (current) drug therapy: Secondary | ICD-10-CM | POA: Diagnosis not present

## 2022-02-16 DIAGNOSIS — C7800 Secondary malignant neoplasm of unspecified lung: Secondary | ICD-10-CM | POA: Diagnosis not present

## 2022-02-16 DIAGNOSIS — C50412 Malignant neoplasm of upper-outer quadrant of left female breast: Secondary | ICD-10-CM | POA: Diagnosis present

## 2022-02-16 DIAGNOSIS — Z95828 Presence of other vascular implants and grafts: Secondary | ICD-10-CM

## 2022-02-16 DIAGNOSIS — I639 Cerebral infarction, unspecified: Secondary | ICD-10-CM | POA: Diagnosis not present

## 2022-02-16 DIAGNOSIS — M5116 Intervertebral disc disorders with radiculopathy, lumbar region: Secondary | ICD-10-CM | POA: Insufficient documentation

## 2022-02-16 DIAGNOSIS — I2510 Atherosclerotic heart disease of native coronary artery without angina pectoris: Secondary | ICD-10-CM | POA: Diagnosis not present

## 2022-02-16 DIAGNOSIS — I700 Atherosclerosis of aorta: Secondary | ICD-10-CM | POA: Insufficient documentation

## 2022-02-16 DIAGNOSIS — Z5112 Encounter for antineoplastic immunotherapy: Secondary | ICD-10-CM | POA: Diagnosis present

## 2022-02-16 DIAGNOSIS — Z8673 Personal history of transient ischemic attack (TIA), and cerebral infarction without residual deficits: Secondary | ICD-10-CM | POA: Diagnosis not present

## 2022-02-16 LAB — CMP (CANCER CENTER ONLY)
ALT: 22 U/L (ref 0–44)
AST: 44 U/L — ABNORMAL HIGH (ref 15–41)
Albumin: 3.5 g/dL (ref 3.5–5.0)
Alkaline Phosphatase: 85 U/L (ref 38–126)
Anion gap: 5 (ref 5–15)
BUN: 22 mg/dL (ref 8–23)
CO2: 26 mmol/L (ref 22–32)
Calcium: 10.7 mg/dL — ABNORMAL HIGH (ref 8.9–10.3)
Chloride: 112 mmol/L — ABNORMAL HIGH (ref 98–111)
Creatinine: 1.64 mg/dL — ABNORMAL HIGH (ref 0.44–1.00)
GFR, Estimated: 31 mL/min — ABNORMAL LOW (ref >60)
Glucose, Bld: 103 mg/dL — ABNORMAL HIGH (ref 70–99)
Potassium: 3.7 mmol/L (ref 3.5–5.1)
Sodium: 143 mmol/L (ref 135–145)
Total Bilirubin: 0.6 mg/dL (ref 0.3–1.2)
Total Protein: 6.6 g/dL (ref 6.5–8.1)

## 2022-02-16 LAB — CBC WITH DIFFERENTIAL (CANCER CENTER ONLY)
Abs Immature Granulocytes: 0.01 10*3/uL (ref 0.00–0.07)
Basophils Absolute: 0 10*3/uL (ref 0.0–0.1)
Basophils Relative: 1 %
Eosinophils Absolute: 0.3 10*3/uL (ref 0.0–0.5)
Eosinophils Relative: 6 %
HCT: 27.8 % — ABNORMAL LOW (ref 36.0–46.0)
Hemoglobin: 9.3 g/dL — ABNORMAL LOW (ref 12.0–15.0)
Immature Granulocytes: 0 %
Lymphocytes Relative: 39 %
Lymphs Abs: 1.8 10*3/uL (ref 0.7–4.0)
MCH: 24.1 pg — ABNORMAL LOW (ref 26.0–34.0)
MCHC: 33.5 g/dL (ref 30.0–36.0)
MCV: 72 fL — ABNORMAL LOW (ref 80.0–100.0)
Monocytes Absolute: 0.3 10*3/uL (ref 0.1–1.0)
Monocytes Relative: 7 %
Neutro Abs: 2.1 10*3/uL (ref 1.7–7.7)
Neutrophils Relative %: 47 %
Platelet Count: 190 10*3/uL (ref 150–400)
RBC: 3.86 MIL/uL — ABNORMAL LOW (ref 3.87–5.11)
RDW: 17.6 % — ABNORMAL HIGH (ref 11.5–15.5)
WBC Count: 4.5 10*3/uL (ref 4.0–10.5)
nRBC: 0 % (ref 0.0–0.2)

## 2022-02-16 MED ORDER — SODIUM CHLORIDE 0.9% FLUSH
10.0000 mL | Freq: Once | INTRAVENOUS | Status: AC
  Administered 2022-02-16: 10 mL

## 2022-02-16 MED ORDER — ACETAMINOPHEN 325 MG PO TABS
650.0000 mg | ORAL_TABLET | Freq: Once | ORAL | Status: AC
  Administered 2022-02-16: 650 mg via ORAL
  Filled 2022-02-16: qty 2

## 2022-02-16 MED ORDER — SODIUM CHLORIDE 0.9 % IV SOLN
3.0000 mg/kg | Freq: Once | INTRAVENOUS | Status: AC
  Administered 2022-02-16: 220 mg via INTRAVENOUS
  Filled 2022-02-16: qty 8

## 2022-02-16 MED ORDER — SODIUM CHLORIDE 0.9 % IV SOLN: Freq: Once | INTRAVENOUS | Status: AC

## 2022-02-16 MED ORDER — DIPHENHYDRAMINE HCL 25 MG PO CAPS
50.0000 mg | ORAL_CAPSULE | Freq: Once | ORAL | Status: AC
  Administered 2022-02-16: 50 mg via ORAL
  Filled 2022-02-16: qty 2

## 2022-02-16 NOTE — Patient Instructions (Signed)
[facility]  Discharge Instructions: Thank you for choosing [facility] to provide your oncology and hematology care.   If you have a lab appointment with the [facility], please go directly to the [facility] and check in at the registration area.   Wear comfortable clothing and clothing appropriate for easy access to any Portacath or PICC line.   We strive to give you quality time with your provider. You may need to reschedule your appointment if you arrive late (15 or more minutes).  Arriving late affects you and other patients whose appointments are after yours.  Also, if you miss three or more appointments without notifying the office, you may be dismissed from the clinic at the provider's discretion.      For prescription refill requests, have your pharmacy contact our office and allow 72 hours for refills to be completed.    Today you received the following chemotherapy and/or immunotherapy agent: Trastuzumab-Emtansine (Kadcyla)    To help prevent nausea and vomiting after your treatment, we encourage you to take your nausea medication as directed.  BELOW ARE SYMPTOMS THAT SHOULD BE REPORTED IMMEDIATELY: *FEVER GREATER THAN 100.4 F (38 C) OR HIGHER *CHILLS OR SWEATING *NAUSEA AND VOMITING THAT IS NOT CONTROLLED WITH YOUR NAUSEA MEDICATION *UNUSUAL SHORTNESS OF BREATH *UNUSUAL BRUISING OR BLEEDING *URINARY PROBLEMS (pain or burning when urinating, or frequent urination) *BOWEL PROBLEMS (unusual diarrhea, constipation, pain near the anus) TENDERNESS IN MOUTH AND THROAT WITH OR WITHOUT PRESENCE OF ULCERS (sore throat, sores in mouth, or a toothache) UNUSUAL RASH, SWELLING OR PAIN  UNUSUAL VAGINAL DISCHARGE OR ITCHING   Items with * indicate a potential emergency and should be followed up as soon as possible or go to the Emergency Department if any problems should occur.  Please show the CHEMOTHERAPY ALERT CARD or IMMUNOTHERAPY ALERT  CARD at check-in to the Emergency Department and triage nurse.  Should you have questions after your visit or need to cancel or reschedule your appointment, please contact [facility]  Dept: [phone]  and follow the prompts.  Office hours are 8:00 a.m. to 4:30 p.m. Monday - Friday. Please note that voicemails left after 4:00 p.m. may not be returned until the following business day.  We are closed weekends and major holidays. You have access to a nurse at all times for urgent questions. Please call the main number to the clinic Dept: [phone] and follow the prompts.   For any non-urgent questions, you may also contact your provider using MyChart. We now offer e-Visits for anyone 87 and older to request care online for non-urgent symptoms. For details visit [email or web address].   Also download the MyChart app! Go to the app store, search "MyChart", open the app, select , and log in with your MyChart username and password.  Masks are optional in the cancer centers. If you would like for your care team to wear a mask while they are taking care of you, please let them know. You may have one support person who is at least 81 years old accompany you for your appointments. Ado-Trastuzumab Emtansine Injection What is this medication? ADO-TRASTUZUMAB EMTANSINE (ADD oh traz TOO zuh mab em TAN zine) treats breast cancer. It works by blocking a protein that causes cancer cells to grow and multiply. This helps to slow or stop the spread of cancer cells. This medicine may be used for other purposes; ask your health care provider or pharmacist if you  have questions. COMMON BRAND NAME(S): Kadcyla What should I tell my care team before I take this medication? They need to know if you have any of these conditions: Heart failure Liver disease Low platelet levels Lung disease Tingling of the fingers or toes or other nerve disorder An unusual or allergic reaction  to ado-trastuzumab emtansine, other medications, foods, dyes, or preservatives Pregnant or trying to get pregnant Breast-feeding How should I use this medication? This medication is infused into a vein. It is given by your care team in a hospital or clinic setting. Talk to your care team about the use of this medication in children. Special care may be needed. Overdosage: If you think you have taken too much of this medicine contact a poison control center or emergency room at once. NOTE: This medicine is only for you. Do not share this medicine with others. What if I miss a dose? Keep appointments for follow-up doses. It is important not to miss your dose. Call your care team if you are unable to keep an appointment. What may interact with this medication? Atazanavir Boceprevir Clarithromycin Dalfopristin; quinupristin Delavirdine Indinavir Isoniazid, INH Itraconazole Ketoconazole Nefazodone Nelfinavir Ritonavir Telaprevir Telithromycin Tipranavir Voriconazole This list may not describe all possible interactions. Give your health care provider a list of all the medicines, herbs, non-prescription drugs, or dietary supplements you use. Also tell them if you smoke, drink alcohol, or use illegal drugs. Some items may interact with your medicine. What should I watch for while using this medication? This medication may make you feel generally unwell. This is not uncommon, as chemotherapy can affect healthy cells as well as cancer cells. Report any side effects. Continue your course of treatment even though you feel ill unless your care team tells you to stop. You may need blood work while taking this medication. This medication may increase your risk to bruise or bleed. Call your care team if you notice any unusual bleeding. Be careful brushing or flossing your teeth or using a toothpick because you may get an infection or bleed more easily. If you have any dental work done, tell your  dentist you are receiving this medication. Talk to your care team if you may be pregnant. Serious birth defects can occur if you take this medication during pregnancy and for 7 months after the last dose. You will need a negative pregnancy test before starting this medication. Contraception is recommended while taking this medication and for 7 months after the last dose. Your care team can help you find the option that works for you. If your partner can get pregnant, use a condom during sex while taking this medication and for 4 months after the last dose. Do not breastfeed while taking this medication and for 7 months after the last dose. This medication may cause infertility. Talk to your care team if you are concerned with your fertility. What side effects may I notice from receiving this medication? Side effects that you should report to your care team as soon as possible: Allergic reactions--skin rash, itching, hives, swelling of the face, lips, tongue, or throat Bleeding--bloody or black, tar-like stools, vomiting blood or brown material that looks like coffee grounds, red or dark brown urine, small red or purple spots on skin, unusual bruising or bleeding Dry cough, shortness of breath or trouble breathing Heart failure--shortness of breath, swelling of the ankles, feet, or hands, sudden weight gain, unusual weakness or fatigue Infusion reactions--chest pain, shortness of breath or trouble breathing, feeling faint  or lightheaded Liver injury--right upper belly pain, loss of appetite, nausea, light-colored stool, dark yellow or brown urine, yellowing skin or eyes, unusual weakness or fatigue Pain, tingling, or numbness in the hands or feet Painful swelling, warmth, or redness of the skin, blisters or sores at the infusion site Side effects that usually do not require medical attention (report to your care team if they continue or are bothersome): Constipation Fatigue Headache Muscle  pain Nausea This list may not describe all possible side effects. Call your doctor for medical advice about side effects. You may report side effects to FDA at 1-800-FDA-1088. Where should I keep my medication? This medication is given in a hospital or clinic. It will not be stored at home. NOTE: This sheet is a summary. It may not cover all possible information. If you have questions about this medicine, talk to your doctor, pharmacist, or health care provider.  2023 Elsevier/Gold Standard (2021-07-09 00:00:00)

## 2022-02-17 ENCOUNTER — Ambulatory Visit: Payer: Medicare Other | Admitting: Hematology

## 2022-02-17 ENCOUNTER — Ambulatory Visit: Payer: Medicare Other

## 2022-02-17 ENCOUNTER — Other Ambulatory Visit: Payer: Medicare Other

## 2022-02-18 ENCOUNTER — Telehealth: Payer: Self-pay | Admitting: Hematology

## 2022-02-18 NOTE — Telephone Encounter (Signed)
Left patient a voicemail regarding upcoming appointments  

## 2022-02-20 ENCOUNTER — Other Ambulatory Visit: Payer: Self-pay

## 2022-02-22 ENCOUNTER — Telehealth: Payer: Self-pay | Admitting: Hematology

## 2022-02-22 ENCOUNTER — Other Ambulatory Visit: Payer: Self-pay

## 2022-02-22 NOTE — Telephone Encounter (Signed)
Spoke with patient changing appointments

## 2022-02-23 ENCOUNTER — Other Ambulatory Visit: Payer: Self-pay

## 2022-02-25 ENCOUNTER — Telehealth: Payer: Self-pay

## 2022-02-25 ENCOUNTER — Ambulatory Visit: Payer: Self-pay

## 2022-03-04 ENCOUNTER — Other Ambulatory Visit: Payer: Self-pay

## 2022-03-05 ENCOUNTER — Other Ambulatory Visit: Payer: Self-pay

## 2022-03-05 ENCOUNTER — Ambulatory Visit
Admission: RE | Admit: 2022-03-05 | Discharge: 2022-03-05 | Disposition: A | Discharge status: Home / Self Care | Payer: Medicare Other | Source: Ambulatory Visit | Attending: Internal Medicine | Admitting: Internal Medicine

## 2022-03-05 VITALS — BP 149/73 | HR 81 | Temp 98.0°F | Resp 20

## 2022-03-05 DIAGNOSIS — W19XXXA Unspecified fall, initial encounter: Secondary | ICD-10-CM

## 2022-03-05 DIAGNOSIS — M25562 Pain in left knee: Secondary | ICD-10-CM

## 2022-03-05 DIAGNOSIS — S0990XA Unspecified injury of head, initial encounter: Secondary | ICD-10-CM

## 2022-03-05 NOTE — ED Provider Notes (Signed)
[name]    CSN: [id number] Arrival date & time: 03/05/22  1144      History   Chief Complaint Chief Complaint  Patient presents with   Fall    Pain in over left eye and knee swollen - Entered by patient    HPI Christy [name] is a 81 y.o. female.   Patient presents for further evaluation after a fall that occurred about 10 days ago.  Patient reports that she was walking down the sidewalk to her house when she lost her balance and fell forward.  She is not sure how she fell but reports that she has "issues with her legs" at times that causes her to have balance issues.  Reports that she fell forward onto her face and hit her face on the pavement.  She is not sure if she lost consciousness or not.  She denies that she takes any blood thinning medications.  She is also having some left knee pain that started after the fall but is not sure if she fell onto the knee or what happened to cause the knee injury.  She denies any persistent headache.  She does report intermittent dizziness and blurred vision but states this is baseline prior to the fall.  Denies nausea, vomiting.   Fall    Past Medical History:  Diagnosis Date   Diabetes mellitus without complication ([facility])    Hyperlipidemia    Hypertension     Patient Active Problem List   Diagnosis Date Noted   Port-A-Cath in place 10/23/2020   Stroke (cerebrum) ([facility]) 09/17/2020   Malignant neoplasm of upper-outer quadrant of female breast ([facility]) 08/13/2020    Past Surgical History:  Procedure Laterality Date   FOOT FRACTURE SURGERY Right    IR IMAGING GUIDED PORT INSERTION  09/22/2020   IR RADIOLOGIST EVAL & MGMT  09/25/2020    OB History   No obstetric history on file.      Home Medications    Prior to Admission medications   Medication Sig Start Date End Date Taking? Authorizing Provider  amLODipine (NORVASC) 5 MG tablet  07/06/20   [provider]  aspirin EC 325 MG tablet Take 1 tablet  (325 mg total) by mouth daily. 09/17/20   Ventura Sellers, MD  atorvastatin (LIPITOR) 20 MG tablet Take 1 tablet (20 mg total) by mouth daily. 10/26/21   Ventura Sellers, MD  cetirizine (ZYRTEC ALLERGY) 10 MG tablet Take 1 tablet (10 mg total) by mouth daily. Patient not taking: Reported on 09/17/2020 09/27/19 10/27/19  Loura Halt A, NP  fluticasone (FLONASE) 50 MCG/ACT nasal spray Place 1 spray into both nostrils daily. Patient not taking: Reported on 09/17/2020 06/25/19 07/25/19  Darr, Edison Nasuti, PA-C  lidocaine-prilocaine (EMLA) cream Apply 1 application topically as needed. 01/05/21   Truitt Merle, MD  metFORMIN (GLUCOPHAGE) 500 MG tablet Take 1 tablet (500 mg total) by mouth 2 (two) times daily with a meal. 09/27/19   Orvan July, NP  olmesartan (BENICAR) 40 MG tablet  07/06/20   [provider]    Family History Family History  Problem Relation Age of Onset   Cancer Sister 64       Breast   Cancer Niece        Possibly breast    Social History Social History   Tobacco Use   Smoking status: Former   Smokeless tobacco: Never  Vaping Use   Vaping Use: Never used  Substance Use Topics  Alcohol use: Not Currently   Drug use: Never     Allergies   Patient has no known allergies.   Review of Systems Review of Systems Per HPI  Physical Exam Triage Vital Signs ED Triage Vitals  Enc Vitals Group     BP 03/05/22 1224 (!) 149/73     Pulse Rate 03/05/22 1224 81     Resp 03/05/22 1224 20     Temp 03/05/22 1224 98 F (36.7 C)     Temp Source 03/05/22 1224 Oral     SpO2 03/05/22 1224 97 %     Weight --      Height --      Head Circumference --      Peak Flow --      Pain Score 03/05/22 1228 0     Pain Loc --      Pain Edu? --      Excl. in [place]? --    No data found.  Updated Vital Signs BP (!) 149/73 (BP Location: Left Arm)   Pulse 81   Temp 98 F (36.7 C) (Oral)   Resp 20   SpO2 97%   Visual Acuity Right Eye Distance:   Left Eye Distance:   Bilateral  Distance:    Right Eye Near:   Left Eye Near:    Bilateral Near:     Physical Exam Constitutional:      General: She is not in acute distress.    Appearance: Normal appearance. She is not toxic-appearing or diaphoretic.  HENT:     Head: Normocephalic and atraumatic.     Comments: Patient has bruising discoloration and swelling surrounding the orbit of the left eye.  Eyeball appears normal.  No abrasions or lacerations noted. Eyes:     Extraocular Movements: Extraocular movements intact.     Conjunctiva/sclera: Conjunctivae normal.  Pulmonary:     Effort: Pulmonary effort is normal.  Genitourinary:    Comments: Bruising discoloration and tenderness to palpation to left anterior knee.  Patient can bear weight.  Neurovascular intact. Neurological:     General: No focal deficit present.     Mental Status: She is alert and oriented to person, place, and time. Mental status is at baseline.     Cranial Nerves: Cranial nerves 2-12 are intact.     Sensory: Sensation is intact.     Motor: Motor function is intact.     Comments: Walking with a walker at baseline.  Otherwise neuro exam is normal.  Psychiatric:        Mood and Affect: Mood normal.        Behavior: Behavior normal.        Thought Content: Thought content normal.        Judgment: Judgment normal.      UC Treatments / Results  Labs (all labs ordered are listed, but only abnormal results are displayed) Labs Reviewed - No data to display  EKG   Radiology No results found.  Procedures Procedures (including critical care time)  Medications Ordered in UC Medications - No data to display  Initial Impression / Assessment and Plan / UC Course  I have reviewed the triage vital signs and the nursing notes.  Pertinent labs & imaging results that were available during my care of the patient were reviewed by me and considered in my medical decision making (see chart for details).     Given patient had head injury from  a fall and has associated swelling and  discoloration surrounding orbit of left eye, I do think this warrants further imaging of the head/face.  Do not have these capabilities here in urgent care so patient was advised to go to the emergency department for further evaluation and management.  Will defer x-ray of knee to ER as well.  Patient was agreeable with this plan.  Daughter who was present in urgent care agreed to transport patient to the ER. Final Clinical Impressions(s) / UC Diagnoses   Final diagnoses:  Fall, initial encounter  Injury of head, initial encounter  Acute pain of left knee     Discharge Instructions      Go to the emergency department as soon as you leave urgent care for further evaluation and management.    ED Prescriptions   None    PDMP not reviewed this encounter.   Teodora Medici, [place] 03/05/22 1249

## 2022-03-05 NOTE — ED Notes (Signed)
Patient is being discharged from the Urgent Care and sent to the Emergency Department via POV . Per HM, patient is in need of higher level of care due to fall. Patient is aware and verbalizes understanding of plan of care.  Vitals:   03/05/22 1224  BP: (!) 149/73  Pulse: 81  Resp: 20  Temp: 98 F (36.7 C)  SpO2: 97%

## 2022-03-05 NOTE — ED Triage Notes (Signed)
Pt here for fall while walking 10 days ago; pt sts she struck her head on pavement and c/o left knee pain; pt sts unsure of what caused her fall

## 2022-03-05 NOTE — Discharge Instructions (Signed)
Go to the emergency department as soon as you leave urgent care for further evaluation and management. 

## 2022-03-08 ENCOUNTER — Emergency Department (HOSPITAL_COMMUNITY): Payer: Medicare Other

## 2022-03-08 ENCOUNTER — Encounter (HOSPITAL_COMMUNITY): Payer: Self-pay | Admitting: Pharmacy Technician

## 2022-03-08 ENCOUNTER — Emergency Department (HOSPITAL_COMMUNITY)
Admission: EM | Admit: 2022-03-08 | Discharge: 2022-03-09 | Discharge status: Against Medical Advice | Payer: Medicare Other | Attending: Emergency Medicine | Admitting: Emergency Medicine

## 2022-03-08 ENCOUNTER — Other Ambulatory Visit: Payer: Self-pay

## 2022-03-08 DIAGNOSIS — W0110XA Fall on same level from slipping, tripping and stumbling with subsequent striking against unspecified object, initial encounter: Secondary | ICD-10-CM | POA: Insufficient documentation

## 2022-03-08 DIAGNOSIS — S0990XA Unspecified injury of head, initial encounter: Secondary | ICD-10-CM | POA: Diagnosis present

## 2022-03-08 DIAGNOSIS — S0012XA Contusion of left eyelid and periocular area, initial encounter: Secondary | ICD-10-CM | POA: Diagnosis not present

## 2022-03-08 DIAGNOSIS — Z5321 Procedure and treatment not carried out due to patient leaving prior to being seen by health care provider: Secondary | ICD-10-CM | POA: Diagnosis not present

## 2022-03-08 DIAGNOSIS — M25562 Pain in left knee: Secondary | ICD-10-CM | POA: Diagnosis not present

## 2022-03-08 NOTE — ED Triage Notes (Signed)
Pt here for ongoing L knee pain after falling about a week ago. Seen at [facility] previously for same and was told to come to the ED for imaging.

## 2022-03-08 NOTE — ED Provider Triage Note (Signed)
Emergency Medicine Provider Triage Evaluation Note  Christy [name] , a 82 y.o. female  was evaluated in triage.  Pt complains of head injury and knee injury after falling almost 2 weeks ago.  Does not really recur what happened but states she may have "got nervous" because she recently moved to a new area.  [place] to her left knee striking her face.  Uncertain if she lost consciousness.  Was seen at urgent care and referred to the ED on December 30.  Does not take any blood thinners.  Complains of pain to her left head and her left knee was able to ambulate with assistance.  No chest pain or shortness of breath.  Unknown if she had syncope.  States she has issues with her legs that caused her to fall sometimes.  Review of Systems  Positive: Headache, knee pain Negative: Chest pain, shortness of breath  Physical Exam  BP (!) 152/83   Pulse 76   Temp 98 F (36.7 C)   Resp 16   SpO2 100%  Gen:   Awake, no distress   Resp:  Normal effort  MSK:   Moves extremities without difficulty, Other:  Left anterior knee abrasion.  No ligament laxity.  Ecchymosis to left periorbital region.  Medical Decision Making  Medically screening exam initiated at 12:35 PM.  Appropriate orders placed.  Christy [name] was informed that the remainder of the evaluation will be completed by another provider, this initial triage assessment does not replace that evaluation, and the importance of remaining in the ED until their evaluation is complete.     Ezequiel Essex, MD 03/08/22 1236

## 2022-03-09 ENCOUNTER — Inpatient Hospital Stay: Payer: Medicare Other

## 2022-03-09 NOTE — ED Notes (Signed)
Pt called 3x no answer  

## 2022-03-10 ENCOUNTER — Other Ambulatory Visit: Payer: Self-pay

## 2022-03-10 ENCOUNTER — Inpatient Hospital Stay: Payer: Medicare Other

## 2022-03-10 ENCOUNTER — Inpatient Hospital Stay: Payer: Medicare Other | Attending: Nurse Practitioner

## 2022-03-10 ENCOUNTER — Encounter: Payer: Self-pay | Admitting: Hematology

## 2022-03-10 VITALS — BP 170/72 | HR 75 | Temp 98.2°F | Resp 17

## 2022-03-10 DIAGNOSIS — I700 Atherosclerosis of aorta: Secondary | ICD-10-CM | POA: Insufficient documentation

## 2022-03-10 DIAGNOSIS — I2510 Atherosclerotic heart disease of native coronary artery without angina pectoris: Secondary | ICD-10-CM | POA: Diagnosis not present

## 2022-03-10 DIAGNOSIS — Z171 Estrogen receptor negative status [ER-]: Secondary | ICD-10-CM

## 2022-03-10 DIAGNOSIS — Z79899 Other long term (current) drug therapy: Secondary | ICD-10-CM | POA: Diagnosis not present

## 2022-03-10 DIAGNOSIS — C50412 Malignant neoplasm of upper-outer quadrant of left female breast: Secondary | ICD-10-CM | POA: Diagnosis present

## 2022-03-10 DIAGNOSIS — Z8673 Personal history of transient ischemic attack (TIA), and cerebral infarction without residual deficits: Secondary | ICD-10-CM | POA: Insufficient documentation

## 2022-03-10 DIAGNOSIS — C778 Secondary and unspecified malignant neoplasm of lymph nodes of multiple regions: Secondary | ICD-10-CM | POA: Insufficient documentation

## 2022-03-10 DIAGNOSIS — J439 Emphysema, unspecified: Secondary | ICD-10-CM | POA: Insufficient documentation

## 2022-03-10 DIAGNOSIS — C7800 Secondary malignant neoplasm of unspecified lung: Secondary | ICD-10-CM | POA: Insufficient documentation

## 2022-03-10 DIAGNOSIS — M4726 Other spondylosis with radiculopathy, lumbar region: Secondary | ICD-10-CM | POA: Insufficient documentation

## 2022-03-10 DIAGNOSIS — M5116 Intervertebral disc disorders with radiculopathy, lumbar region: Secondary | ICD-10-CM | POA: Diagnosis not present

## 2022-03-10 DIAGNOSIS — N200 Calculus of kidney: Secondary | ICD-10-CM | POA: Insufficient documentation

## 2022-03-10 DIAGNOSIS — Z5112 Encounter for antineoplastic immunotherapy: Secondary | ICD-10-CM | POA: Diagnosis present

## 2022-03-10 DIAGNOSIS — I6381 Other cerebral infarction due to occlusion or stenosis of small artery: Secondary | ICD-10-CM | POA: Insufficient documentation

## 2022-03-10 DIAGNOSIS — Z95828 Presence of other vascular implants and grafts: Secondary | ICD-10-CM

## 2022-03-10 LAB — CMP (CANCER CENTER ONLY)
ALT: 23 U/L (ref 0–44)
AST: 47 U/L — ABNORMAL HIGH (ref 15–41)
Albumin: 3.4 g/dL — ABNORMAL LOW (ref 3.5–5.0)
Alkaline Phosphatase: 90 U/L (ref 38–126)
Anion gap: 5 (ref 5–15)
BUN: 19 mg/dL (ref 8–23)
CO2: 27 mmol/L (ref 22–32)
Calcium: 10.5 mg/dL — ABNORMAL HIGH (ref 8.9–10.3)
Chloride: 111 mmol/L (ref 98–111)
Creatinine: 1.69 mg/dL — ABNORMAL HIGH (ref 0.44–1.00)
GFR, Estimated: 30 mL/min — ABNORMAL LOW (ref >60)
Glucose, Bld: 88 mg/dL (ref 70–99)
Potassium: 3.8 mmol/L (ref 3.5–5.1)
Sodium: 143 mmol/L (ref 135–145)
Total Bilirubin: 0.6 mg/dL (ref 0.3–1.2)
Total Protein: 6.3 g/dL — ABNORMAL LOW (ref 6.5–8.1)

## 2022-03-10 LAB — CBC WITH DIFFERENTIAL (CANCER CENTER ONLY)
Abs Immature Granulocytes: 0.01 10*3/uL (ref 0.00–0.07)
Basophils Absolute: 0 10*3/uL (ref 0.0–0.1)
Basophils Relative: 1 %
Eosinophils Absolute: 0.3 10*3/uL (ref 0.0–0.5)
Eosinophils Relative: 6 %
HCT: 28 % — ABNORMAL LOW (ref 36.0–46.0)
Hemoglobin: 9.4 g/dL — ABNORMAL LOW (ref 12.0–15.0)
Immature Granulocytes: 0 %
Lymphocytes Relative: 43 %
Lymphs Abs: 2 10*3/uL (ref 0.7–4.0)
MCH: 24.5 pg — ABNORMAL LOW (ref 26.0–34.0)
MCHC: 33.6 g/dL (ref 30.0–36.0)
MCV: 72.9 fL — ABNORMAL LOW (ref 80.0–100.0)
Monocytes Absolute: 0.4 10*3/uL (ref 0.1–1.0)
Monocytes Relative: 9 %
Neutro Abs: 1.9 10*3/uL (ref 1.7–7.7)
Neutrophils Relative %: 41 %
Platelet Count: 144 10*3/uL — ABNORMAL LOW (ref 150–400)
RBC: 3.84 MIL/uL — ABNORMAL LOW (ref 3.87–5.11)
RDW: 18.1 % — ABNORMAL HIGH (ref 11.5–15.5)
WBC Count: 4.6 10*3/uL (ref 4.0–10.5)
nRBC: 0 % (ref 0.0–0.2)

## 2022-03-10 MED ORDER — SODIUM CHLORIDE 0.9 % IV SOLN
3.0000 mg/kg | Freq: Once | INTRAVENOUS | Status: AC
  Administered 2022-03-10: 220 mg via INTRAVENOUS
  Filled 2022-03-10: qty 8

## 2022-03-10 MED ORDER — SODIUM CHLORIDE 0.9% FLUSH
10.0000 mL | INTRAVENOUS | Status: DC | PRN
  Administered 2022-03-10: 10 mL

## 2022-03-10 MED ORDER — SODIUM CHLORIDE 0.9 % IV SOLN: Freq: Once | INTRAVENOUS | Status: AC

## 2022-03-10 MED ORDER — SODIUM CHLORIDE 0.9% FLUSH
10.0000 mL | Freq: Once | INTRAVENOUS | Status: AC
  Administered 2022-03-10: 10 mL

## 2022-03-10 MED ORDER — HEPARIN SOD (PORK) LOCK FLUSH 100 UNIT/ML IV SOLN
500.0000 [IU] | Freq: Once | INTRAVENOUS | Status: AC | PRN
  Administered 2022-03-10: 500 [IU]

## 2022-03-10 MED ORDER — DIPHENHYDRAMINE HCL 25 MG PO CAPS
50.0000 mg | ORAL_CAPSULE | Freq: Once | ORAL | Status: AC
  Administered 2022-03-10: 50 mg via ORAL
  Filled 2022-03-10: qty 2

## 2022-03-10 MED ORDER — ACETAMINOPHEN 325 MG PO TABS
650.0000 mg | ORAL_TABLET | Freq: Once | ORAL | Status: AC
  Administered 2022-03-10: 650 mg via ORAL
  Filled 2022-03-10: qty 2

## 2022-03-10 NOTE — Progress Notes (Signed)
Creatinine 1.6. OK to treat w/ Kadcyla per Dr. Burr Medico if Creatine is <2.0. See treatment parameters

## 2022-03-10 NOTE — Progress Notes (Signed)
1420: Pt decline 30 min post Kadcyla observation

## 2022-03-10 NOTE — Patient Instructions (Signed)
[facility]  Discharge Instructions: Thank you for choosing [facility] to provide your oncology and hematology care.   If you have a lab appointment with the [facility], please go directly to the [facility] and check in at the registration area.   Wear comfortable clothing and clothing appropriate for easy access to any Portacath or PICC line.   We strive to give you quality time with your provider. You may need to reschedule your appointment if you arrive late (15 or more minutes).  Arriving late affects you and other patients whose appointments are after yours.  Also, if you miss three or more appointments without notifying the office, you may be dismissed from the clinic at the provider's discretion.      For prescription refill requests, have your pharmacy contact our office and allow 72 hours for refills to be completed.    Today you received the following chemotherapy and/or immunotherapy agent: Trastuzumab-Emtansine (Kadcyla)    To help prevent nausea and vomiting after your treatment, we encourage you to take your nausea medication as directed.  BELOW ARE SYMPTOMS THAT SHOULD BE REPORTED IMMEDIATELY: *FEVER GREATER THAN 100.4 F (38 C) OR HIGHER *CHILLS OR SWEATING *NAUSEA AND VOMITING THAT IS NOT CONTROLLED WITH YOUR NAUSEA MEDICATION *UNUSUAL SHORTNESS OF BREATH *UNUSUAL BRUISING OR BLEEDING *URINARY PROBLEMS (pain or burning when urinating, or frequent urination) *BOWEL PROBLEMS (unusual diarrhea, constipation, pain near the anus) TENDERNESS IN MOUTH AND THROAT WITH OR WITHOUT PRESENCE OF ULCERS (sore throat, sores in mouth, or a toothache) UNUSUAL RASH, SWELLING OR PAIN  UNUSUAL VAGINAL DISCHARGE OR ITCHING   Items with * indicate a potential emergency and should be followed up as soon as possible or go to the Emergency Department if any problems should occur.  Please show the CHEMOTHERAPY ALERT CARD or IMMUNOTHERAPY ALERT  CARD at check-in to the Emergency Department and triage nurse.  Should you have questions after your visit or need to cancel or reschedule your appointment, please contact [facility]  Dept: [phone]  and follow the prompts.  Office hours are 8:00 a.m. to 4:30 p.m. Monday - Friday. Please note that voicemails left after 4:00 p.m. may not be returned until the following business day.  We are closed weekends and major holidays. You have access to a nurse at all times for urgent questions. Please call the main number to the clinic Dept: [phone] and follow the prompts.   For any non-urgent questions, you may also contact your provider using MyChart. We now offer e-Visits for anyone 86 and older to request care online for non-urgent symptoms. For details visit [email or web address].   Also download the MyChart app! Go to the app store, search "MyChart", open the app, select [facility], and log in with your MyChart username and password.  Masks are optional in the cancer centers. If you would like for your care team to wear a mask while they are taking care of you, please let them know. You may have one support person who is at least 82 years old accompany you for your appointments. Ado-Trastuzumab Emtansine Injection What is this medication? ADO-TRASTUZUMAB EMTANSINE (ADD oh traz TOO zuh mab em TAN zine) treats breast cancer. It works by blocking a protein that causes cancer cells to grow and multiply. This helps to slow or stop the spread of cancer cells. This medicine may be used for other purposes; ask your health care provider or pharmacist if you  have questions. COMMON BRAND NAME(S): Kadcyla What should I tell my care team before I take this medication? They need to know if you have any of these conditions: Heart failure Liver disease Low platelet levels Lung disease Tingling of the fingers or toes or other nerve disorder An unusual or allergic reaction  to ado-trastuzumab emtansine, other medications, foods, dyes, or preservatives Pregnant or trying to get pregnant Breast-feeding How should I use this medication? This medication is infused into a vein. It is given by your care team in a hospital or clinic setting. Talk to your care team about the use of this medication in children. Special care may be needed. Overdosage: If you think you have taken too much of this medicine contact a poison control center or emergency room at once. NOTE: This medicine is only for you. Do not share this medicine with others. What if I miss a dose? Keep appointments for follow-up doses. It is important not to miss your dose. Call your care team if you are unable to keep an appointment. What may interact with this medication? Atazanavir Boceprevir Clarithromycin Dalfopristin; quinupristin Delavirdine Indinavir Isoniazid, INH Itraconazole Ketoconazole Nefazodone Nelfinavir Ritonavir Telaprevir Telithromycin Tipranavir Voriconazole This list may not describe all possible interactions. Give your health care provider a list of all the medicines, herbs, non-prescription drugs, or dietary supplements you use. Also tell them if you smoke, drink alcohol, or use illegal drugs. Some items may interact with your medicine. What should I watch for while using this medication? This medication may make you feel generally unwell. This is not uncommon, as chemotherapy can affect healthy cells as well as cancer cells. Report any side effects. Continue your course of treatment even though you feel ill unless your care team tells you to stop. You may need blood work while taking this medication. This medication may increase your risk to bruise or bleed. Call your care team if you notice any unusual bleeding. Be careful brushing or flossing your teeth or using a toothpick because you may get an infection or bleed more easily. If you have any dental work done, tell your  dentist you are receiving this medication. Talk to your care team if you may be pregnant. Serious birth defects can occur if you take this medication during pregnancy and for 7 months after the last dose. You will need a negative pregnancy test before starting this medication. Contraception is recommended while taking this medication and for 7 months after the last dose. Your care team can help you find the option that works for you. If your partner can get pregnant, use a condom during sex while taking this medication and for 4 months after the last dose. Do not breastfeed while taking this medication and for 7 months after the last dose. This medication may cause infertility. Talk to your care team if you are concerned with your fertility. What side effects may I notice from receiving this medication? Side effects that you should report to your care team as soon as possible: Allergic reactions--skin rash, itching, hives, swelling of the face, lips, tongue, or throat Bleeding--bloody or black, tar-like stools, vomiting blood or brown material that looks like coffee grounds, red or dark brown urine, small red or purple spots on skin, unusual bruising or bleeding Dry cough, shortness of breath or trouble breathing Heart failure--shortness of breath, swelling of the ankles, feet, or hands, sudden weight gain, unusual weakness or fatigue Infusion reactions--chest pain, shortness of breath or trouble breathing, feeling faint  or lightheaded Liver injury--right upper belly pain, loss of appetite, nausea, light-colored stool, dark yellow or brown urine, yellowing skin or eyes, unusual weakness or fatigue Pain, tingling, or numbness in the hands or feet Painful swelling, warmth, or redness of the skin, blisters or sores at the infusion site Side effects that usually do not require medical attention (report to your care team if they continue or are bothersome): Constipation Fatigue Headache Muscle  pain Nausea This list may not describe all possible side effects. Call your doctor for medical advice about side effects. You may report side effects to FDA at 1-800-FDA-1088. Where should I keep my medication? This medication is given in a hospital or clinic. It will not be stored at home. NOTE: This sheet is a summary. It may not cover all possible information. If you have questions about this medicine, talk to your doctor, pharmacist, or health care provider.  2023 Elsevier/Gold Standard (2021-07-09 00:00:00)

## 2022-03-11 LAB — CANCER ANTIGEN 27.29: CA 27.29: 39 U/mL — ABNORMAL HIGH (ref 0.0–38.6)

## 2022-03-17 ENCOUNTER — Other Ambulatory Visit: Payer: Self-pay

## 2022-03-29 NOTE — Progress Notes (Unsigned)
[facility]   Telephone:(336) [phone] Fax:(336) [phone]   Clinic Follow up Note   Patient Care Team: [name], MD as PCP - General (Internal Medicine) Malachy Mood, MD as Consulting Physician (Medical Oncology)  Date of Service:  03/30/2022  CHIEF COMPLAINT: f/u of metastatic breast cancer     CURRENT THERAPY:   Kadcyla every 3 weeks, started on 09/11/20    ASSESSMENT:  Christy [name] is a 82 y.o. 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 Work-up showed 2 masses in the left breast UOQ and 3 abnormal lymph nodes. Biopsy showed IDC to both masses and biopsied lymph node. -PET scan 08/28/20 showed: hypermetabolism to left breast and axilla, retropectoral and supraclavicular adenopathy, and several 3-6 mm pulmonary nodules. -She started first-line Kadcyla on 09/11/20. She tolerates well with some fatigue, dose decreased with C4.  -She has had good clinical response, breast mass was no longer palpable on physical exam 06/09/21.  -restaging CT CAP 10/07/21 was stable, NED. -PET scan showed two small lung nodule (5-56mm), slightly bigger since last scan, no other hypermetabolic lesions. Will continue Kadcyla   Stroke (cerebrum) (HCC) -spinal stenosis diagnosed while in [place] -subacute lacunar infarct seen on staging brain MRI 08/2020. -she ambulates with a walker due to leg weakness, has received shots in her left hip.  Recent falls  -we discussed precautions for fall, and I encouraged her to buy a walker on her own since her insurance does not cover that    PLAN: -lab  reviewed  -proceed with C24 Kadcyla today at same dose  -lab,flush,and f/u  Kadcyla in 3 weeks    SUMMARY OF ONCOLOGIC HISTORY: Oncology History Overview Note  Cancer Staging Malignant neoplasm of upper-outer quadrant of  female breast [facility]) Staging form: Breast, AJCC 8th Edition - Clinical stage from 07/31/2020: Stage IV (cT2, cN3, cM1, G2, ER-, PR-, HER2+) - Signed by Pollyann Samples, NP on 08/31/2020 Stage prefix: Initial diagnosis Nuclear grade: G2 Histologic grading system: 3 grade system    Malignant neoplasm of upper-outer quadrant of female breast (HCC)  07/24/2020 Breast US   On physical exam, a very firm mass is identified at the 1:30 position of the LEFT breast 5 cm from the nipple.   IMPRESSION: 1. Highly suspicious 4.3 cm mass at the 1:30 position of the LEFT breast and highly suspicious 2.7 cm mass at the 2 o'clock position of the LEFT breast, with the 2 masses encompassing an area measuring at least 6.1 cm. 2. 3 abnormal LEFT axillary lymph nodes with cortical thickening, tissue sampling of 1 of these lymph nodes recommended. 3. Anterior LEFT breast skin thickening nonspecific but may reflect dermal involvement. 4. No mammographic evidence of RIGHT breast malignancy.   07/31/2020 Initial Biopsy   Diagnosis 1. Breast, left, needle core biopsy, 1 :30 pm, 5cmfn - INVASIVE DUCTAL CARCINOMA, SEE COMMENT. 2. Breast, left, needle core biopsy, 2 o'clock, 8cmfn - INVASIVE DUCTAL CARCINOMA, SEE COMMENT. - DUCTAL CARCINOMA IN SITU. 3. Lymph node, needle/core biopsy, left axillary - METASTATIC CARCINOMA IN A LYMPH NODE. Microscopic Comment 1. and 3. The carcinoma in both parts has a similar morphology and appears grade 2-3. The DCIS in part 2 is high-grade with necrosis. The carcinoma measures 15 mm (part 1) and 14 mm (part 2) in greatest linear extent  2. PROGNOSTIC INDICATORS  Results: IMMUNOHISTOCHEMICAL AND MORPHOMETRIC ANALYSIS PERFORMED MANUALLY The tumor cells are POSITIVE for Her2 (3+). Estrogen Receptor: 0%, NEGATIVE Progesterone Receptor: 0%, NEGATIVE Proliferation Marker Ki67: 20%    07/31/2020 Cancer Staging   Staging form: Breast, AJCC 8th Edition - Clinical stage from 07/31/2020:  Stage IV (cT2, cN3, cM1, G2, ER-, PR-, HER2+) - Signed by Pollyann Samples, NP on 08/31/2020 Stage prefix: Initial diagnosis Nuclear grade: G2 Histologic grading system: 3 grade system   08/13/2020 Initial Diagnosis   Malignant neoplasm of upper-outer quadrant of female breast (HCC)   08/28/2020 PET scan   IMPRESSION: Signs of LEFT breast cancer with LEFT axillary and retropectoral adenopathy as well as LEFT supraclavicular adenopathy.   Signs of pulmonary metastatic disease.   Mild asymmetric uptake in the LEFT as compared to the RIGHT adrenal gland. Equivocal but suspicious, attention on follow-up.   Aortic atherosclerosis.   Cystic changes and potential bronchiectasis in the RIGHT lung base likely post infectious or inflammatory. Correlate with any respiratory symptoms. Comparison with prior imaging may be helpful with attention on follow-up.   08/28/2020 Imaging   BRAIN MRI IMPRESSION: 1. Subacute lacunar infarct of the left basal ganglia with no malignant hemorrhagic transformation or mass effect. Underlying chronic bilateral basal ganglia ischemia. 2. No metastatic disease or other acute intracranial abnormality identified.   08/28/2020 Imaging   Breast MRI   IMPRESSION: 1. 8.1 x 8.1 x 5.1 cm area of biopsy-proven malignancy in the central, upper outer and lower outer quadrants of the left breast. 2. Diffuse anterior left breast skin thickening without abnormal enhancement. This may represent thickening due to lymphedema associated with lymphatic obstruction. 3. Metastatic level 1 and level 2 left axillary adenopathy. 4. No evidence of malignancy on the right.   09/11/2020 - 11/03/2021 Chemotherapy   Patient is on Treatment Plan : BREAST ADO-Trastuzumab Emtansine (Kadcyla) q21d     09/11/2020 -  Chemotherapy   Patient is on Treatment Plan : BREAST ADO-Trastuzumab Emtansine (Kadcyla) q21d     11/30/2020 Imaging   CT Chest w/o contrast  IMPRESSION: 1. Interval response to  therapy. The left breast mass has mildly decreased in size in the interval. 2. Decrease in size of left axillary and left retropectoral lymph nodes. 3. Small right lung nodules stable to improved. 4. Coronary artery calcifications noted. 5. Aortic Atherosclerosis (ICD10-I70.0).   03/04/2021 PET scan   IMPRESSION: 1. Overall marked improvement compared to the prior PET-CT, with resolution of hypermetabolic activity and pathologic enlargement of the previously involved lymph nodes; lack of hypermetabolic activity and nonvisualization of prior right-sided lung nodules, and substantial reduction in size and activity in the left breast primary. 2. New ground-glass opacity medially in the right lower lobe with low-grade metabolic activity, highly likely to be inflammatory (alveolitis) given the morphology and appearance. 3. Other imaging findings of potential clinical significance: Aortic Atherosclerosis (ICD10-I70.0). Coronary atherosclerosis. Mitral valve calcifications. Punctate nonobstructive left renal calculi. Lower lumbar impingement due to chronic spondylosis and degenerative disc disease. Remote left basal ganglia infarct.   06/07/2021 Imaging   EXAM: CT CHEST, ABDOMEN AND PELVIS WITHOUT CONTRAST  IMPRESSION: 1. Scattered tiny bilateral pulmonary nodules are similar to the prior exams, favored to be benign. 2. Otherwise, no evidence of metastatic disease within the chest, abdomen, or pelvis. 3. Right lower lobe ground-glass nodule has resolved since the prior PET 4.  Possible constipation. 5. Coronary artery atherosclerosis. Aortic Atherosclerosis (ICD10-I70.0). Emphysema (ICD10-J43.9). 6. Left nephrolithiasis.      INTERVAL HISTORY:  Christy Siegfried  [name] is here for a follow up of metastatic breast cancer    She was last seen by me on 02/16/2022 She presents to the clinic alone. Pt states her hands are always cold. Pt report she fell twice since the last time she was in office.  Pt hit her head and scrape her knee.    All other systems were reviewed with the patient and are negative.  MEDICAL HISTORY:  Past Medical History:  Diagnosis Date   Diabetes mellitus without complication (HCC)    Hyperlipidemia    Hypertension     SURGICAL HISTORY: Past Surgical History:  Procedure Laterality Date   FOOT FRACTURE SURGERY Right    IR IMAGING GUIDED PORT INSERTION  09/22/2020   IR RADIOLOGIST EVAL & MGMT  09/25/2020    I have reviewed the social history and family history with the patient and they are unchanged from previous note.  ALLERGIES:  has No Known Allergies.  MEDICATIONS:  Current Outpatient Medications  Medication Sig Dispense Refill   amLODipine (NORVASC) 5 MG tablet  (Patient not taking: Reported on 09/17/2020)     aspirin EC 325 MG tablet Take 1 tablet (325 mg total) by mouth daily. 30 tablet 5   atorvastatin (LIPITOR) 20 MG tablet Take 1 tablet (20 mg total) by mouth daily. 60 tablet 3   cetirizine (ZYRTEC ALLERGY) 10 MG tablet Take 1 tablet (10 mg total) by mouth daily. (Patient not taking: Reported on 09/17/2020) 30 tablet 0   fluticasone (FLONASE) 50 MCG/ACT nasal spray Place 1 spray into both nostrils daily. (Patient not taking: Reported on 09/17/2020) 1 g 0   lidocaine-prilocaine (EMLA) cream Apply 1 application topically as needed. 30 g 2   metFORMIN (GLUCOPHAGE) 500 MG tablet Take 1 tablet (500 mg total) by mouth 2 (two) times daily with a meal. 60 tablet 1   olmesartan (BENICAR) 40 MG tablet      No current facility-administered medications for this visit.   Facility-Administered Medications Ordered in Other Visits  Medication Dose Route Frequency Provider Last Rate Last Admin   ado-trastuzumab emtansine (KADCYLA) 200 mg in sodium chloride 0.9 % 250 mL chemo infusion  3 mg/kg (Treatment Plan Recorded) Intravenous Once Malachy Mood, MD       heparin lock flush 100 unit/mL  500 Units Intracatheter Once PRN Malachy Mood, MD       sodium chloride  flush (NS) 0.9 % injection 10 mL  10 mL Intracatheter PRN Malachy Mood, MD        PHYSICAL EXAMINATION: ECOG PERFORMANCE STATUS: 1 - Symptomatic but completely ambulatory  Vitals:   03/30/22 1220  BP: (!) 156/78  Pulse: 69  Resp: 16  Temp: 98.1 F (36.7 C)  SpO2: 100%   Wt Readings from Last 3 Encounters:  03/30/22 152 lb 6.4 oz (69.1 kg)  02/16/22 154 lb 4.8 oz (70 kg)  01/26/22 154 lb 12 oz (70.2 kg)     GENERAL:alert, no distress and comfortable SKIN: skin color, texture, turgor are normal, no rashes or significant lesions EYES: normal, Conjunctiva are pink and non-injected, sclera clear NECK:(-) supple, thyroid normal size, non-tender, without nodularity LYMPH: (-)  no palpable lymphadenopathy in the cervical, axillary  ABDOMEN:(-) abdomen soft, non-tender and normal bowel sounds Musculoskeletal:no cyanosis of digits and no clubbing  NEURO: alert & oriented x 3 with fluent speech, no focal motor/sensory deficits Breast : No palpable mass, Breast exam benign.     LABORATORY DATA:  I have reviewed the data as listed  Latest Ref Rng & Units 03/30/2022   11:30 AM 03/10/2022   10:55 AM 02/16/2022   12:39 PM  CBC  WBC 4.0 - 10.5 K/uL 4.8  4.6  4.5   Hemoglobin 12.0 - 15.0 g/dL 9.2  9.4  9.3   Hematocrit 36.0 - 46.0 % 26.8  28.0  27.8   Platelets 150 - 400 K/uL 146  144  190         Latest Ref Rng & Units 03/30/2022   11:30 AM 03/10/2022   10:55 AM 02/16/2022   12:39 PM  CMP  Glucose 70 - 99 mg/dL 85  88  103   BUN 8 - 23 mg/dL 20  19  22    Creatinine 0.44 - 1.00 mg/dL 1.66  1.69  1.64   Sodium 135 - 145 mmol/L 142  143  143   Potassium 3.5 - 5.1 mmol/L 3.8  3.8  3.7   Chloride 98 - 111 mmol/L 111  111  112   CO2 22 - 32 mmol/L 27  27  26    Calcium 8.9 - 10.3 mg/dL 10.3  10.5  10.7   Total Protein 6.5 - 8.1 g/dL 6.7  6.3  6.6   Total Bilirubin 0.3 - 1.2 mg/dL 0.6  0.6  0.6   Alkaline Phos 38 - 126 U/L 95  90  85   AST 15 - 41 U/L 45  47  44   ALT 0 - 44 U/L 23   23  22        RADIOGRAPHIC STUDIES: I have personally reviewed the radiological images as listed and agreed with the findings in the report. No results found.    Orders Placed This Encounter  Procedures   CBC with Differential ([facility] Only)    Standing Status:   Future    Standing Expiration Date:   04/21/2023   CMP ([facility] only)    Standing Status:   Future    Standing Expiration Date:   04/21/2023   CBC with Differential ([facility] Only)    Standing Status:   Future    Standing Expiration Date:   05/12/2023   CMP ([facility] only)    Standing Status:   Future    Standing Expiration Date:   05/12/2023   All questions were answered. The patient knows to call the clinic with any problems, questions or concerns. No barriers to learning was detected. The total time spent in the appointment was 30 minutes.     Truitt Merle, MD 03/30/2022   Felicity Coyer, CMA, am acting as scribe for Truitt Merle, MD.   I have reviewed the above documentation for accuracy and completeness, and I agree with the above.

## 2022-03-29 NOTE — Assessment & Plan Note (Signed)
cT2N3M1 stage IV, grade 2-3, ER-/PR- HER2+, with nodal and pulmonary metastases -She initially self palpated a left breast mass around 06/2020. Work-up showed 2 masses in the left breast UOQ and 3 abnormal lymph nodes. Biopsy showed IDC to both masses and biopsied lymph node. -PET scan 08/28/20 showed: hypermetabolism to left breast and axilla, retropectoral and supraclavicular adenopathy, and several 3-6 mm pulmonary nodules. -She started first-line Kadcyla on 09/11/20. She tolerates well with some fatigue, dose decreased with C4.  -She has had good clinical response, breast mass was no longer palpable on physical exam 06/09/21.  -restaging CT CAP 10/07/21 was stable, NED. -PET scan showed two small lung nodule (5-6mm), slightly bigger since last scan, no other hypermetabolic lesions. Will continue Kadcyla  

## 2022-03-29 NOTE — Assessment & Plan Note (Signed)
-  spinal stenosis diagnosed while in [place] -subacute lacunar infarct seen on staging brain MRI 08/2020. -she ambulates with a walker due to leg weakness, has received shots in her left hip.

## 2022-03-30 ENCOUNTER — Inpatient Hospital Stay: Payer: Medicare Other

## 2022-03-30 ENCOUNTER — Inpatient Hospital Stay: Payer: Medicare Other | Admitting: Hematology

## 2022-03-30 ENCOUNTER — Encounter: Payer: Self-pay | Admitting: Hematology

## 2022-03-30 ENCOUNTER — Other Ambulatory Visit: Payer: Self-pay | Admitting: *Deleted

## 2022-03-30 ENCOUNTER — Other Ambulatory Visit: Payer: Self-pay

## 2022-03-30 ENCOUNTER — Inpatient Hospital Stay (HOSPITAL_BASED_OUTPATIENT_CLINIC_OR_DEPARTMENT_OTHER): Payer: Medicare Other | Admitting: Hematology

## 2022-03-30 VITALS — BP 156/78 | HR 69 | Temp 98.1°F | Resp 16 | Ht 64.0 in | Wt 152.4 lb

## 2022-03-30 DIAGNOSIS — I639 Cerebral infarction, unspecified: Secondary | ICD-10-CM | POA: Diagnosis not present

## 2022-03-30 DIAGNOSIS — C50412 Malignant neoplasm of upper-outer quadrant of left female breast: Secondary | ICD-10-CM | POA: Diagnosis not present

## 2022-03-30 DIAGNOSIS — Z171 Estrogen receptor negative status [ER-]: Secondary | ICD-10-CM

## 2022-03-30 LAB — CMP (CANCER CENTER ONLY)
ALT: 23 U/L (ref 0–44)
AST: 45 U/L — ABNORMAL HIGH (ref 15–41)
Albumin: 3.2 g/dL — ABNORMAL LOW (ref 3.5–5.0)
Alkaline Phosphatase: 95 U/L (ref 38–126)
Anion gap: 4 — ABNORMAL LOW (ref 5–15)
BUN: 20 mg/dL (ref 8–23)
CO2: 27 mmol/L (ref 22–32)
Calcium: 10.3 mg/dL (ref 8.9–10.3)
Chloride: 111 mmol/L (ref 98–111)
Creatinine: 1.66 mg/dL — ABNORMAL HIGH (ref 0.44–1.00)
GFR, Estimated: 31 mL/min — ABNORMAL LOW (ref >60)
Glucose, Bld: 85 mg/dL (ref 70–99)
Potassium: 3.8 mmol/L (ref 3.5–5.1)
Sodium: 142 mmol/L (ref 135–145)
Total Bilirubin: 0.6 mg/dL (ref 0.3–1.2)
Total Protein: 6.7 g/dL (ref 6.5–8.1)

## 2022-03-30 LAB — CBC WITH DIFFERENTIAL (CANCER CENTER ONLY)
Abs Immature Granulocytes: 0.01 10*3/uL (ref 0.00–0.07)
Basophils Absolute: 0 10*3/uL (ref 0.0–0.1)
Basophils Relative: 1 %
Eosinophils Absolute: 0.2 10*3/uL (ref 0.0–0.5)
Eosinophils Relative: 4 %
HCT: 26.8 % — ABNORMAL LOW (ref 36.0–46.0)
Hemoglobin: 9.2 g/dL — ABNORMAL LOW (ref 12.0–15.0)
Immature Granulocytes: 0 %
Lymphocytes Relative: 39 %
Lymphs Abs: 1.9 10*3/uL (ref 0.7–4.0)
MCH: 24.9 pg — ABNORMAL LOW (ref 26.0–34.0)
MCHC: 34.3 g/dL (ref 30.0–36.0)
MCV: 72.6 fL — ABNORMAL LOW (ref 80.0–100.0)
Monocytes Absolute: 0.4 10*3/uL (ref 0.1–1.0)
Monocytes Relative: 9 %
Neutro Abs: 2.3 10*3/uL (ref 1.7–7.7)
Neutrophils Relative %: 47 %
Platelet Count: 146 10*3/uL — ABNORMAL LOW (ref 150–400)
RBC: 3.69 MIL/uL — ABNORMAL LOW (ref 3.87–5.11)
RDW: 17.3 % — ABNORMAL HIGH (ref 11.5–15.5)
WBC Count: 4.8 10*3/uL (ref 4.0–10.5)
nRBC: 0 % (ref 0.0–0.2)

## 2022-03-30 MED ORDER — ACETAMINOPHEN 325 MG PO TABS
650.0000 mg | ORAL_TABLET | Freq: Once | ORAL | Status: AC
  Administered 2022-03-30: 650 mg via ORAL
  Filled 2022-03-30: qty 2

## 2022-03-30 MED ORDER — SODIUM CHLORIDE 0.9 % IV SOLN: Freq: Once | INTRAVENOUS | Status: AC

## 2022-03-30 MED ORDER — SODIUM CHLORIDE 0.9 % IV SOLN
3.0000 mg/kg | Freq: Once | INTRAVENOUS | Status: AC
  Administered 2022-03-30: 200 mg via INTRAVENOUS
  Filled 2022-03-30: qty 10

## 2022-03-30 MED ORDER — HEPARIN SOD (PORK) LOCK FLUSH 100 UNIT/ML IV SOLN
500.0000 [IU] | Freq: Once | INTRAVENOUS | Status: AC | PRN
  Administered 2022-03-30: 500 [IU]

## 2022-03-30 MED ORDER — SODIUM CHLORIDE 0.9% FLUSH
10.0000 mL | INTRAVENOUS | Status: DC | PRN
  Administered 2022-03-30: 10 mL

## 2022-03-30 MED ORDER — DIPHENHYDRAMINE HCL 25 MG PO CAPS
50.0000 mg | ORAL_CAPSULE | Freq: Once | ORAL | Status: AC
  Administered 2022-03-30: 50 mg via ORAL
  Filled 2022-03-30: qty 2

## 2022-03-30 NOTE — Patient Instructions (Signed)
[facility]  Discharge Instructions: Thank you for choosing [facility] to provide your oncology and hematology care.   If you have a lab appointment with the [facility], please go directly to the [facility] and check in at the registration area.   Wear comfortable clothing and clothing appropriate for easy access to any Portacath or PICC line.   We strive to give you quality time with your provider. You may need to reschedule your appointment if you arrive late (15 or more minutes).  Arriving late affects you and other patients whose appointments are after yours.  Also, if you miss three or more appointments without notifying the office, you may be dismissed from the clinic at the provider's discretion.      For prescription refill requests, have your pharmacy contact our office and allow 72 hours for refills to be completed.    Today you received the following chemotherapy and/or immunotherapy agents Kadcyla      To help prevent nausea and vomiting after your treatment, we encourage you to take your nausea medication as directed.  BELOW ARE SYMPTOMS THAT SHOULD BE REPORTED IMMEDIATELY: *FEVER GREATER THAN 100.4 F (38 C) OR HIGHER *CHILLS OR SWEATING *NAUSEA AND VOMITING THAT IS NOT CONTROLLED WITH YOUR NAUSEA MEDICATION *UNUSUAL SHORTNESS OF BREATH *UNUSUAL BRUISING OR BLEEDING *URINARY PROBLEMS (pain or burning when urinating, or frequent urination) *BOWEL PROBLEMS (unusual diarrhea, constipation, pain near the anus) TENDERNESS IN MOUTH AND THROAT WITH OR WITHOUT PRESENCE OF ULCERS (sore throat, sores in mouth, or a toothache) UNUSUAL RASH, SWELLING OR PAIN  UNUSUAL VAGINAL DISCHARGE OR ITCHING   Items with * indicate a potential emergency and should be followed up as soon as possible or go to the Emergency Department if any problems should occur.  Please show the CHEMOTHERAPY ALERT CARD or IMMUNOTHERAPY ALERT CARD at check-in  to the Emergency Department and triage nurse.  Should you have questions after your visit or need to cancel or reschedule your appointment, please contact [facility]  Dept: [phone]  and follow the prompts.  Office hours are 8:00 a.m. to 4:30 p.m. Monday - Friday. Please note that voicemails left after 4:00 p.m. may not be returned until the following business day.  We are closed weekends and major holidays. You have access to a nurse at all times for urgent questions. Please call the main number to the clinic Dept: [phone] and follow the prompts.   For any non-urgent questions, you may also contact your provider using MyChart. We now offer e-Visits for anyone 25 and older to request care online for non-urgent symptoms. For details visit [email or web address].   Also download the MyChart app! Go to the app store, search "MyChart", open the app, select [facility], and log in with your MyChart username and password.

## 2022-03-30 NOTE — Progress Notes (Signed)
Patient declined to stay for 30 minute post Kadcyla observation.

## 2022-03-31 ENCOUNTER — Other Ambulatory Visit: Payer: Self-pay

## 2022-03-31 ENCOUNTER — Telehealth: Payer: Self-pay | Admitting: Hematology

## 2022-03-31 NOTE — Telephone Encounter (Signed)
Tried calling pt received no answer vm full

## 2022-04-01 ENCOUNTER — Other Ambulatory Visit: Payer: Self-pay

## 2022-04-19 ENCOUNTER — Other Ambulatory Visit: Payer: Self-pay

## 2022-04-19 DIAGNOSIS — C50412 Malignant neoplasm of upper-outer quadrant of left female breast: Secondary | ICD-10-CM

## 2022-04-19 DIAGNOSIS — Z171 Estrogen receptor negative status [ER-]: Secondary | ICD-10-CM

## 2022-04-19 NOTE — Assessment & Plan Note (Signed)
-  spinal stenosis diagnosed while in [place] -subacute lacunar infarct seen on staging brain MRI 08/2020. -she ambulates with a walker due to leg weakness, has received shots in her left hip.

## 2022-04-19 NOTE — Assessment & Plan Note (Signed)
cT2N3M1 stage IV, grade 2-3, ER-/PR- HER2+, with nodal and pulmonary metastases -She initially self palpated a left breast mass around 06/2020. Work-up showed 2 masses in the left breast UOQ and 3 abnormal lymph nodes. Biopsy showed IDC to both masses and biopsied lymph node. -PET scan 08/28/20 showed: hypermetabolism to left breast and axilla, retropectoral and supraclavicular adenopathy, and several 3-6 mm pulmonary nodules. -She started first-line Kadcyla on 09/11/20. She tolerates well with some fatigue, dose decreased with C4.  -She has had good clinical response, breast mass was no longer palpable on physical exam 06/09/21.  -restaging CT CAP 10/07/21 was stable, NED. -PET scan showed two small lung nodule (5-6mm), slightly bigger since last scan, no other hypermetabolic lesions. Will continue Kadcyla  

## 2022-04-19 NOTE — Progress Notes (Unsigned)
[facility]   [phone] Fax:([phone]) [phone]   Clinic Follow up Note   Patient Care Team: Nolene Ebbs, MD as PCP - General (Internal Medicine) Truitt Merle, MD as Consulting Physician (Medical Oncology)  Date of Service:  04/20/2022  CHIEF COMPLAINT: f/u of metastatic breast cancer    CURRENT THERAPY:   Kadcyla every 3 weeks, started on 09/11/20      ASSESSMENT:   Christy [name] is a 82 y.o. 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 Work-up showed 2 masses in the left breast UOQ and 3 abnormal lymph nodes. Biopsy showed IDC to both masses and biopsied lymph node. -PET scan 08/28/20 showed: hypermetabolism to left breast and axilla, retropectoral and supraclavicular adenopathy, and several 3-6 mm pulmonary nodules. -She started first-line Kadcyla on 09/11/20. She tolerates well with some fatigue, dose decreased with C4.  -She has had good clinical response, breast mass was no longer palpable on physical exam 06/09/21.  -restaging CT CAP 10/07/21 was stable, NED. -PET scan showed two small lung nodule (5-92mm), slightly bigger since last scan, no other hypermetabolic lesions. Will continue Kadcyla   Stroke (cerebrum) ([place]) -spinal stenosis diagnosed while in [place] -subacute lacunar infarct seen on staging brain MRI 08/2020. -she ambulates with a walker due to leg weakness, has received shots in her left hip.  Multiple falls -Patient has had multiple falls at home and outside in the past few months, no major injury. -I again encouraged her to buy a walker, to replace her rollator, for better support.  She will ask her daughter to buy 1 for her. -Patient has had physical therapy, and is still doing some at home.    PLAN: -Lab reviewed -Echo schedule 04/27/2022 -order CT scan to be  done in 05/2022 -proceed with C25 Kadcyla today at same dose -lab,flush and f/u Kadcyla 05/11/2022  SUMMARY OF ONCOLOGIC HISTORY: Oncology History Overview Note  Cancer Staging Malignant neoplasm of upper-outer quadrant of female breast [facility]) Staging form: Breast, AJCC 8th Edition - Clinical stage from 07/31/2020: Stage IV (cT2, cN3, cM1, G2, ER-, PR-, HER2+) - Signed by Alla Feeling, NP on 08/31/2020 Stage prefix: Initial diagnosis Nuclear grade: G2 Histologic grading system: 3 grade system    Malignant neoplasm of upper-outer quadrant of female breast ([place])  07/24/2020 Breast US   On physical exam, a very firm mass is identified at the 1:30 position of the LEFT breast 5 cm from the nipple.   IMPRESSION: 1. Highly suspicious 4.3 cm mass at the 1:30 position of the LEFT breast and highly suspicious 2.7 cm mass at the 2 o'clock position of the LEFT breast, with the 2 masses encompassing an area measuring at least 6.1 cm. 2. 3 abnormal LEFT axillary lymph nodes with cortical thickening, tissue sampling of 1 of these lymph nodes recommended. 3. Anterior LEFT breast skin thickening nonspecific but may reflect dermal involvement. 4. No mammographic evidence of RIGHT breast malignancy.   07/31/2020 Initial Biopsy   Diagnosis 1. Breast, left, needle core biopsy, 1 :30 pm, 5cmfn - INVASIVE DUCTAL CARCINOMA, SEE COMMENT. 2. Breast, left, needle core biopsy, 2 o'clock, 8cmfn - INVASIVE DUCTAL CARCINOMA, SEE COMMENT. - DUCTAL CARCINOMA IN SITU. 3. Lymph node, needle/core biopsy, left axillary - METASTATIC CARCINOMA IN A LYMPH NODE. Microscopic Comment 1. and 3. The carcinoma in both parts  has a similar morphology and appears grade 2-3. The DCIS in part 2 is high-grade with necrosis. The carcinoma measures 15 mm (part 1) and 14 mm (part 2) in greatest linear extent  2. PROGNOSTIC INDICATORS Results: IMMUNOHISTOCHEMICAL AND MORPHOMETRIC ANALYSIS PERFORMED MANUALLY The tumor cells are  POSITIVE for Her2 (3+). Estrogen Receptor: 0%, NEGATIVE Progesterone Receptor: 0%, NEGATIVE Proliferation Marker Ki67: 20%    07/31/2020 Cancer Staging   Staging form: Breast, AJCC 8th Edition - Clinical stage from 07/31/2020: Stage IV (cT2, cN3, cM1, G2, ER-, PR-, HER2+) - Signed by Alla Feeling, NP on 08/31/2020 Stage prefix: Initial diagnosis Nuclear grade: G2 Histologic grading system: 3 grade system   08/13/2020 Initial Diagnosis   Malignant neoplasm of upper-outer quadrant of female breast ([facility] [place])   08/28/2020 PET scan   IMPRESSION: Signs of LEFT breast cancer with LEFT axillary and retropectoral adenopathy as well as LEFT supraclavicular adenopathy.   Signs of pulmonary metastatic disease.   Mild asymmetric uptake in the LEFT as compared to the RIGHT adrenal gland. Equivocal but suspicious, attention on follow-up.   Aortic atherosclerosis.   Cystic changes and potential bronchiectasis in the RIGHT lung base likely post infectious or inflammatory. Correlate with any respiratory symptoms. Comparison with prior imaging may be helpful with attention on follow-up.   08/28/2020 Imaging   BRAIN MRI IMPRESSION: 1. Subacute lacunar infarct of the left basal ganglia with no malignant hemorrhagic transformation or mass effect. Underlying chronic bilateral basal ganglia ischemia. 2. No metastatic disease or other acute intracranial abnormality identified.   08/28/2020 Imaging   Breast MRI   IMPRESSION: 1. 8.1 x 8.1 x 5.1 cm area of biopsy-proven malignancy in the central, upper outer and lower outer quadrants of the left breast. 2. Diffuse anterior left breast skin thickening without abnormal enhancement. This may represent thickening due to lymphedema associated with lymphatic obstruction. 3. Metastatic level 1 and level 2 left axillary adenopathy. 4. No evidence of malignancy on the right.   09/11/2020 - 11/03/2021 Chemotherapy   Patient is on Treatment Plan : BREAST  ADO-Trastuzumab Emtansine (Kadcyla) q21d     09/11/2020 -  Chemotherapy   Patient is on Treatment Plan : BREAST ADO-Trastuzumab Emtansine (Kadcyla) q21d     11/30/2020 Imaging   CT Chest w/o contrast  IMPRESSION: 1. Interval response to therapy. The left breast mass has mildly decreased in size in the interval. 2. Decrease in size of left axillary and left retropectoral lymph nodes. 3. Small right lung nodules stable to improved. 4. Coronary artery calcifications noted. 5. Aortic Atherosclerosis (ICD10-I70.0).   03/04/2021 PET scan   IMPRESSION: 1. Overall marked improvement compared to the prior PET-CT, with resolution of hypermetabolic activity and pathologic enlargement of the previously involved lymph nodes; lack of hypermetabolic activity and nonvisualization of prior right-sided lung nodules, and substantial reduction in size and activity in the left breast primary. 2. New ground-glass opacity medially in the right lower lobe with low-grade metabolic activity, highly likely to be inflammatory (alveolitis) given the morphology and appearance. 3. Other imaging findings of potential clinical significance: Aortic Atherosclerosis (ICD10-I70.0). Coronary atherosclerosis. Mitral valve calcifications. Punctate nonobstructive left renal calculi. Lower lumbar impingement due to chronic spondylosis and degenerative disc disease. Remote left basal ganglia infarct.   06/07/2021 Imaging   EXAM: CT CHEST, ABDOMEN AND PELVIS WITHOUT CONTRAST  IMPRESSION: 1. Scattered tiny bilateral pulmonary nodules are similar to the prior exams, favored to be benign. 2. Otherwise, no evidence of metastatic disease within the chest, abdomen, or pelvis. 3.  Right lower lobe ground-glass nodule has resolved since the prior PET 4.  Possible constipation. 5. Coronary artery atherosclerosis. Aortic Atherosclerosis (ICD10-I70.0). Emphysema (ICD10-J43.9). 6. Left nephrolithiasis.      INTERVAL HISTORY:   KELY DOHN is here for a follow up of  metastatic breast cancer  She was last seen by me on 03/30/2022 She presents to the clinic alone. Pt fell on Saturday and hit her head and has a bruise on her left knee. Pt stated she had her cane and not her walker when she was crossing the street. Pt does feel dizzy. Pt states she has some fatigue and it resolves with rest.   All other systems were reviewed with the patient and are negative.  MEDICAL HISTORY:  Past Medical History:  Diagnosis Date   Diabetes mellitus without complication ([place])    Hyperlipidemia    Hypertension     SURGICAL HISTORY: Past Surgical History:  Procedure Laterality Date   FOOT FRACTURE SURGERY Right    IR IMAGING GUIDED PORT INSERTION  09/22/2020   IR RADIOLOGIST EVAL & MGMT  09/25/2020    I have reviewed the social history and family history with the patient and they are unchanged from previous note.  ALLERGIES:  has No Known Allergies.  MEDICATIONS:  Current Outpatient Medications  Medication Sig Dispense Refill   amLODipine (NORVASC) 5 MG tablet  (Patient not taking: Reported on 09/17/2020)     aspirin EC 325 MG tablet Take 1 tablet (325 mg total) by mouth daily. 30 tablet 5   atorvastatin (LIPITOR) 20 MG tablet Take 1 tablet (20 mg total) by mouth daily. 60 tablet 3   cetirizine (ZYRTEC ALLERGY) 10 MG tablet Take 1 tablet (10 mg total) by mouth daily. (Patient not taking: Reported on 09/17/2020) 30 tablet 0   fluticasone (FLONASE) 50 MCG/ACT nasal spray Place 1 spray into both nostrils daily. (Patient not taking: Reported on 09/17/2020) 1 g 0   lidocaine-prilocaine (EMLA) cream Apply 1 application topically as needed. 30 g 2   metFORMIN (GLUCOPHAGE) 500 MG tablet Take 1 tablet (500 mg total) by mouth 2 (two) times daily with a meal. 60 tablet 1   olmesartan (BENICAR) 40 MG tablet      No current facility-administered medications for this visit.   Facility-Administered Medications Ordered in Other Visits   Medication Dose Route Frequency Provider Last Rate Last Admin   sodium chloride flush (NS) 0.9 % injection 10 mL  10 mL Intracatheter PRN Truitt Merle, MD   10 mL at 04/20/22 1324    PHYSICAL EXAMINATION: ECOG PERFORMANCE STATUS: 2 - Symptomatic, <50% confined to bed  Vitals:   04/20/22 1108  BP: (!) 149/82  Pulse: 73  Resp: 14  Temp: 98.3 F (36.8 C)  SpO2: 97%   Wt Readings from Last 3 Encounters:  04/20/22 153 lb 14.4 oz (69.8 kg)  03/30/22 152 lb 6.4 oz (69.1 kg)  02/16/22 154 lb 4.8 oz (70 kg)     GENERAL:alert, no distress and comfortable SKIN: skin color normal, no rashes or significant lesions EYES: normal, Conjunctiva are pink and non-injected, sclera clear  NEURO: alert & oriented x 3 with fluent speech LUNGS: (-)clear to auscultation and percussion with normal breathing effort HEART:(-)  regular rate & rhythm and no murmurs and no lower extremity edema ABDOMEN:(-) abdomen soft, non-tender and normal bowel sounds  LABORATORY DATA:  I have reviewed the data as listed    Latest Ref Rng & Units 04/20/2022   10:19 AM  03/30/2022   11:30 AM 03/10/2022   10:55 AM  CBC  WBC 4.0 - 10.5 K/uL 4.6  4.8  4.6   Hemoglobin 12.0 - 15.0 g/dL 9.1  9.2  9.4   Hematocrit 36.0 - 46.0 % 27.1  26.8  28.0   Platelets 150 - 400 K/uL 156  146  144         Latest Ref Rng & Units 04/20/2022   10:19 AM 03/30/2022   11:30 AM 03/10/2022   10:55 AM  CMP  Glucose 70 - 99 mg/dL 103  85  88   BUN 8 - 23 mg/dL 18  20  19    Creatinine 0.44 - 1.00 mg/dL 1.58  1.66  1.69   Sodium 135 - 145 mmol/L 142  142  143   Potassium 3.5 - 5.1 mmol/L 3.4  3.8  3.8   Chloride 98 - 111 mmol/L 110  111  111   CO2 22 - 32 mmol/L 26  27  27    Calcium 8.9 - 10.3 mg/dL 10.3  10.3  10.5   Total Protein 6.5 - 8.1 g/dL 6.7  6.7  6.3   Total Bilirubin 0.3 - 1.2 mg/dL 0.7  0.6  0.6   Alkaline Phos 38 - 126 U/L 83  95  90   AST 15 - 41 U/L 43  45  47   ALT 0 - 44 U/L 22  23  23        RADIOGRAPHIC STUDIES: I  have personally reviewed the radiological images as listed and agreed with the findings in the report. No results found.    Orders Placed This Encounter  Procedures   CT CHEST ABDOMEN PELVIS W CONTRAST    Standing Status:   Future    Standing Expiration Date:   04/21/2023    Order Specific Question:   Preferred imaging location?    Answer:   [facility]    Order Specific Question:   Is Oral Contrast requested for this exam?    Answer:   Yes, Per Radiology protocol   CBC with Differential ([facility] Only)    Standing Status:   Future    Standing Expiration Date:   06/02/2023   CMP ([facility] only)    Standing Status:   Future    Standing Expiration Date:   06/02/2023   CBC with Differential ([facility] Only)    Standing Status:   Future    Standing Expiration Date:   06/23/2023   CMP ([facility] only)    Standing Status:   Future    Standing Expiration Date:   06/23/2023   All questions were answered. The patient knows to call the clinic with any problems, questions or concerns. No barriers to learning was detected. The total time spent in the appointment was 30 minutes.     Truitt Merle, MD 04/20/2022   Felicity Coyer, CMA, am acting as scribe for Truitt Merle, MD.   I have reviewed the above documentation for accuracy and completeness, and I agree with the above.

## 2022-04-20 ENCOUNTER — Inpatient Hospital Stay: Payer: Medicare Other | Attending: Nurse Practitioner

## 2022-04-20 ENCOUNTER — Encounter: Payer: Self-pay | Admitting: Hematology

## 2022-04-20 ENCOUNTER — Inpatient Hospital Stay: Payer: Medicare Other

## 2022-04-20 ENCOUNTER — Other Ambulatory Visit: Payer: Self-pay

## 2022-04-20 ENCOUNTER — Inpatient Hospital Stay (HOSPITAL_BASED_OUTPATIENT_CLINIC_OR_DEPARTMENT_OTHER): Payer: Medicare Other | Admitting: Hematology

## 2022-04-20 ENCOUNTER — Inpatient Hospital Stay: Payer: Medicare Other | Admitting: Hematology

## 2022-04-20 VITALS — BP 158/59 | HR 75 | Temp 98.0°F | Resp 19

## 2022-04-20 VITALS — BP 149/82 | HR 73 | Temp 98.3°F | Resp 14 | Wt 153.9 lb

## 2022-04-20 DIAGNOSIS — I700 Atherosclerosis of aorta: Secondary | ICD-10-CM | POA: Insufficient documentation

## 2022-04-20 DIAGNOSIS — I10 Essential (primary) hypertension: Secondary | ICD-10-CM | POA: Diagnosis not present

## 2022-04-20 DIAGNOSIS — R9431 Abnormal electrocardiogram [ECG] [EKG]: Secondary | ICD-10-CM | POA: Insufficient documentation

## 2022-04-20 DIAGNOSIS — R590 Localized enlarged lymph nodes: Secondary | ICD-10-CM | POA: Insufficient documentation

## 2022-04-20 DIAGNOSIS — C50412 Malignant neoplasm of upper-outer quadrant of left female breast: Secondary | ICD-10-CM | POA: Insufficient documentation

## 2022-04-20 DIAGNOSIS — S8002XA Contusion of left knee, initial encounter: Secondary | ICD-10-CM | POA: Insufficient documentation

## 2022-04-20 DIAGNOSIS — I639 Cerebral infarction, unspecified: Secondary | ICD-10-CM

## 2022-04-20 DIAGNOSIS — M5116 Intervertebral disc disorders with radiculopathy, lumbar region: Secondary | ICD-10-CM | POA: Insufficient documentation

## 2022-04-20 DIAGNOSIS — Z95828 Presence of other vascular implants and grafts: Secondary | ICD-10-CM

## 2022-04-20 DIAGNOSIS — Z8673 Personal history of transient ischemic attack (TIA), and cerebral infarction without residual deficits: Secondary | ICD-10-CM | POA: Diagnosis not present

## 2022-04-20 DIAGNOSIS — C7800 Secondary malignant neoplasm of unspecified lung: Secondary | ICD-10-CM | POA: Diagnosis not present

## 2022-04-20 DIAGNOSIS — Z171 Estrogen receptor negative status [ER-]: Secondary | ICD-10-CM

## 2022-04-20 DIAGNOSIS — I2510 Atherosclerotic heart disease of native coronary artery without angina pectoris: Secondary | ICD-10-CM | POA: Diagnosis not present

## 2022-04-20 DIAGNOSIS — R234 Changes in skin texture: Secondary | ICD-10-CM | POA: Diagnosis not present

## 2022-04-20 DIAGNOSIS — Z79899 Other long term (current) drug therapy: Secondary | ICD-10-CM | POA: Diagnosis not present

## 2022-04-20 DIAGNOSIS — N200 Calculus of kidney: Secondary | ICD-10-CM | POA: Insufficient documentation

## 2022-04-20 DIAGNOSIS — R42 Dizziness and giddiness: Secondary | ICD-10-CM | POA: Diagnosis not present

## 2022-04-20 DIAGNOSIS — M4726 Other spondylosis with radiculopathy, lumbar region: Secondary | ICD-10-CM | POA: Diagnosis not present

## 2022-04-20 DIAGNOSIS — I3481 Nonrheumatic mitral (valve) annulus calcification: Secondary | ICD-10-CM | POA: Diagnosis not present

## 2022-04-20 DIAGNOSIS — E785 Hyperlipidemia, unspecified: Secondary | ICD-10-CM | POA: Insufficient documentation

## 2022-04-20 DIAGNOSIS — R0989 Other specified symptoms and signs involving the circulatory and respiratory systems: Secondary | ICD-10-CM | POA: Insufficient documentation

## 2022-04-20 DIAGNOSIS — I6381 Other cerebral infarction due to occlusion or stenosis of small artery: Secondary | ICD-10-CM | POA: Diagnosis not present

## 2022-04-20 DIAGNOSIS — E119 Type 2 diabetes mellitus without complications: Secondary | ICD-10-CM | POA: Insufficient documentation

## 2022-04-20 DIAGNOSIS — Z5112 Encounter for antineoplastic immunotherapy: Secondary | ICD-10-CM | POA: Diagnosis present

## 2022-04-20 DIAGNOSIS — J439 Emphysema, unspecified: Secondary | ICD-10-CM | POA: Insufficient documentation

## 2022-04-20 LAB — CBC WITH DIFFERENTIAL (CANCER CENTER ONLY)
Abs Immature Granulocytes: 0.01 10*3/uL (ref 0.00–0.07)
Basophils Absolute: 0.1 10*3/uL (ref 0.0–0.1)
Basophils Relative: 1 %
Eosinophils Absolute: 0.4 10*3/uL (ref 0.0–0.5)
Eosinophils Relative: 8 %
HCT: 27.1 % — ABNORMAL LOW (ref 36.0–46.0)
Hemoglobin: 9.1 g/dL — ABNORMAL LOW (ref 12.0–15.0)
Immature Granulocytes: 0 %
Lymphocytes Relative: 44 %
Lymphs Abs: 2 10*3/uL (ref 0.7–4.0)
MCH: 24.1 pg — ABNORMAL LOW (ref 26.0–34.0)
MCHC: 33.6 g/dL (ref 30.0–36.0)
MCV: 71.9 fL — ABNORMAL LOW (ref 80.0–100.0)
Monocytes Absolute: 0.3 10*3/uL (ref 0.1–1.0)
Monocytes Relative: 7 %
Neutro Abs: 1.8 10*3/uL (ref 1.7–7.7)
Neutrophils Relative %: 40 %
Platelet Count: 156 10*3/uL (ref 150–400)
RBC: 3.77 MIL/uL — ABNORMAL LOW (ref 3.87–5.11)
RDW: 16.9 % — ABNORMAL HIGH (ref 11.5–15.5)
WBC Count: 4.6 10*3/uL (ref 4.0–10.5)
nRBC: 0 % (ref 0.0–0.2)

## 2022-04-20 LAB — CMP (CANCER CENTER ONLY)
ALT: 22 U/L (ref 0–44)
AST: 43 U/L — ABNORMAL HIGH (ref 15–41)
Albumin: 3.4 g/dL — ABNORMAL LOW (ref 3.5–5.0)
Alkaline Phosphatase: 83 U/L (ref 38–126)
Anion gap: 6 (ref 5–15)
BUN: 18 mg/dL (ref 8–23)
CO2: 26 mmol/L (ref 22–32)
Calcium: 10.3 mg/dL (ref 8.9–10.3)
Chloride: 110 mmol/L (ref 98–111)
Creatinine: 1.58 mg/dL — ABNORMAL HIGH (ref 0.44–1.00)
GFR, Estimated: 32 mL/min — ABNORMAL LOW (ref >60)
Glucose, Bld: 103 mg/dL — ABNORMAL HIGH (ref 70–99)
Potassium: 3.4 mmol/L — ABNORMAL LOW (ref 3.5–5.1)
Sodium: 142 mmol/L (ref 135–145)
Total Bilirubin: 0.7 mg/dL (ref 0.3–1.2)
Total Protein: 6.7 g/dL (ref 6.5–8.1)

## 2022-04-20 MED ORDER — SODIUM CHLORIDE 0.9 % IV SOLN
3.0000 mg/kg | Freq: Once | INTRAVENOUS | Status: AC
  Administered 2022-04-20: 200 mg via INTRAVENOUS
  Filled 2022-04-20: qty 10

## 2022-04-20 MED ORDER — ACETAMINOPHEN 325 MG PO TABS
650.0000 mg | ORAL_TABLET | Freq: Once | ORAL | Status: AC
  Administered 2022-04-20: 650 mg via ORAL
  Filled 2022-04-20: qty 2

## 2022-04-20 MED ORDER — SODIUM CHLORIDE 0.9 % IV SOLN: Freq: Once | INTRAVENOUS | Status: AC

## 2022-04-20 MED ORDER — SODIUM CHLORIDE 0.9% FLUSH
10.0000 mL | Freq: Once | INTRAVENOUS | Status: AC
  Administered 2022-04-20: 10 mL

## 2022-04-20 MED ORDER — SODIUM CHLORIDE 0.9% FLUSH
10.0000 mL | INTRAVENOUS | Status: DC | PRN
  Administered 2022-04-20: 10 mL

## 2022-04-20 MED ORDER — HEPARIN SOD (PORK) LOCK FLUSH 100 UNIT/ML IV SOLN
500.0000 [IU] | Freq: Once | INTRAVENOUS | Status: AC | PRN
  Administered 2022-04-20: 500 [IU]

## 2022-04-20 MED ORDER — DIPHENHYDRAMINE HCL 25 MG PO CAPS
50.0000 mg | ORAL_CAPSULE | Freq: Once | ORAL | Status: AC
  Administered 2022-04-20: 50 mg via ORAL
  Filled 2022-04-20: qty 2

## 2022-04-20 NOTE — Progress Notes (Signed)
Per Dr. Burr Medico - okay to proceed with Kadcyla treatment on 2/14 with echo from October 2023. Patient to receive updated echo prior to treatment in 3 weeks.

## 2022-04-20 NOTE — Patient Instructions (Signed)
[facility]  Discharge Instructions: Thank you for choosing [facility] to provide your oncology and hematology care.   If you have a lab appointment with the [facility], please go directly to the [facility] and check in at the registration area.   Wear comfortable clothing and clothing appropriate for easy access to any Portacath or PICC line.   We strive to give you quality time with your provider. You may need to reschedule your appointment if you arrive late (15 or more minutes).  Arriving late affects you and other patients whose appointments are after yours.  Also, if you miss three or more appointments without notifying the office, you may be dismissed from the clinic at the provider's discretion.      For prescription refill requests, have your pharmacy contact our office and allow 72 hours for refills to be completed.    Today you received the following chemotherapy and/or immunotherapy agents Kadcyla      To help prevent nausea and vomiting after your treatment, we encourage you to take your nausea medication as directed.  BELOW ARE SYMPTOMS THAT SHOULD BE REPORTED IMMEDIATELY: *FEVER GREATER THAN 100.4 F (38 C) OR HIGHER *CHILLS OR SWEATING *NAUSEA AND VOMITING THAT IS NOT CONTROLLED WITH YOUR NAUSEA MEDICATION *UNUSUAL SHORTNESS OF BREATH *UNUSUAL BRUISING OR BLEEDING *URINARY PROBLEMS (pain or burning when urinating, or frequent urination) *BOWEL PROBLEMS (unusual diarrhea, constipation, pain near the anus) TENDERNESS IN MOUTH AND THROAT WITH OR WITHOUT PRESENCE OF ULCERS (sore throat, sores in mouth, or a toothache) UNUSUAL RASH, SWELLING OR PAIN  UNUSUAL VAGINAL DISCHARGE OR ITCHING   Items with * indicate a potential emergency and should be followed up as soon as possible or go to the Emergency Department if any problems should occur.  Please show the CHEMOTHERAPY ALERT CARD or IMMUNOTHERAPY ALERT CARD at check-in  to the Emergency Department and triage nurse.  Should you have questions after your visit or need to cancel or reschedule your appointment, please contact [facility]  Dept: [phone]  and follow the prompts.  Office hours are 8:00 a.m. to 4:30 p.m. Monday - Friday. Please note that voicemails left after 4:00 p.m. may not be returned until the following business day.  We are closed weekends and major holidays. You have access to a nurse at all times for urgent questions. Please call the main number to the clinic Dept: [phone] and follow the prompts.   For any non-urgent questions, you may also contact your provider using MyChart. We now offer e-Visits for anyone 17 and older to request care online for non-urgent symptoms. For details visit [email or web address].   Also download the MyChart app! Go to the app store, search "MyChart", open the app, select [facility], and log in with your MyChart username and password.

## 2022-04-20 NOTE — Progress Notes (Signed)
Patient declined 30 min post obs.  VSS.  Patient ambulated with rolator walker to lobby.  Angie Fava, RN

## 2022-04-21 ENCOUNTER — Other Ambulatory Visit: Payer: Self-pay

## 2022-04-21 LAB — CANCER ANTIGEN 27.29: CA 27.29: 28.2 U/mL (ref 0.0–38.6)

## 2022-04-22 ENCOUNTER — Other Ambulatory Visit: Payer: Self-pay

## 2022-04-27 ENCOUNTER — Inpatient Hospital Stay: Payer: Medicare Other

## 2022-04-27 ENCOUNTER — Ambulatory Visit (HOSPITAL_BASED_OUTPATIENT_CLINIC_OR_DEPARTMENT_OTHER)
Admission: RE | Admit: 2022-04-27 | Discharge: 2022-04-27 | Disposition: A | Discharge status: Home / Self Care | Payer: Medicare Other | Source: Ambulatory Visit | Attending: Hematology | Admitting: Hematology

## 2022-04-27 DIAGNOSIS — E785 Hyperlipidemia, unspecified: Secondary | ICD-10-CM | POA: Insufficient documentation

## 2022-04-27 DIAGNOSIS — E119 Type 2 diabetes mellitus without complications: Secondary | ICD-10-CM | POA: Insufficient documentation

## 2022-04-27 DIAGNOSIS — Z171 Estrogen receptor negative status [ER-]: Secondary | ICD-10-CM

## 2022-04-27 DIAGNOSIS — R9431 Abnormal electrocardiogram [ECG] [EKG]: Secondary | ICD-10-CM | POA: Insufficient documentation

## 2022-04-27 DIAGNOSIS — C50412 Malignant neoplasm of upper-outer quadrant of left female breast: Secondary | ICD-10-CM | POA: Diagnosis not present

## 2022-04-27 DIAGNOSIS — I10 Essential (primary) hypertension: Secondary | ICD-10-CM | POA: Insufficient documentation

## 2022-04-27 DIAGNOSIS — I3481 Nonrheumatic mitral (valve) annulus calcification: Secondary | ICD-10-CM | POA: Insufficient documentation

## 2022-04-27 LAB — ECHOCARDIOGRAM COMPLETE
Area-P 1/2: 3.51 cm2
MV M vel: 4.86 m/s
MV Peak grad: 94.5 mmHg
MV VTI: 2.82 cm2
S' Lateral: 3.1 cm

## 2022-04-27 NOTE — Progress Notes (Signed)
  Echocardiogram 2D Echocardiogram has been performed.  Christy [name] 04/27/2022, 11:07 AM

## 2022-05-03 ENCOUNTER — Ambulatory Visit: Payer: Self-pay

## 2022-05-03 ENCOUNTER — Other Ambulatory Visit: Payer: Self-pay

## 2022-05-03 ENCOUNTER — Ambulatory Visit
Admission: RE | Admit: 2022-05-03 | Discharge: 2022-05-03 | Disposition: A | Discharge status: Home / Self Care | Payer: Medicare Other | Source: Ambulatory Visit | Attending: Family Medicine | Admitting: Family Medicine

## 2022-05-03 VITALS — BP 178/72 | HR 77 | Temp 97.8°F | Resp 18

## 2022-05-03 DIAGNOSIS — R040 Epistaxis: Secondary | ICD-10-CM | POA: Diagnosis not present

## 2022-05-03 NOTE — ED Triage Notes (Signed)
Pt here today with daughter for continued nose bleeds; mostly daily x 2 weeks; no bleeding noted at present

## 2022-05-03 NOTE — ED Provider Notes (Signed)
[name]    CSN: [id number] Arrival date & time: 05/03/22  1145      History   Chief Complaint Chief Complaint  Patient presents with   Hypertension    Nose bleed for over  week - Entered by patient   Epistaxis    HPI IVANNIA [name] is a 82 y.o. female.    Hypertension  Epistaxis  Here with nosebleeds that have been bothering her for the last week or so.  Her right side bleeds more than her left.  She does note that there is a hard scab inside her right nostril.  No fever or headache.  No congestion.  She does have some cough after eating but has been a chronic issue.  She does take olmesartan for blood pressure, but does not check her blood pressure at home. 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      Home Medications    Prior to Admission medications   Medication Sig Start Date End Date Taking? Authorizing Provider  aspirin EC 325 MG tablet Take 1 tablet (325 mg total) by mouth daily. 09/17/20   Ventura Sellers, MD  atorvastatin (LIPITOR) 20 MG tablet Take 1 tablet (20 mg total) by mouth daily. 10/26/21   Ventura Sellers, MD  cetirizine (ZYRTEC ALLERGY) 10 MG tablet Take 1 tablet (10 mg total) by mouth daily. Patient not taking: Reported on 09/17/2020 09/27/19 10/27/19  Loura Halt A, NP  fluticasone (FLONASE) 50 MCG/ACT nasal spray Place 1 spray into both nostrils daily. Patient not taking: Reported on 09/17/2020 06/25/19 07/25/19  Darr,  Edison Nasuti, PA-C  lidocaine-prilocaine (EMLA) cream Apply 1 application topically as needed. 01/05/21   Truitt Merle, MD  metFORMIN (GLUCOPHAGE) 500 MG tablet Take 1 tablet (500 mg total) by mouth 2 (two) times daily with a meal. 09/27/19   Orvan July, NP  olmesartan (BENICAR) 40 MG tablet  07/06/20   [provider]    Family History Family History  Problem Relation Age of Onset   Cancer Sister 23       Breast   Cancer Niece        Possibly breast    Social History Social History   Tobacco Use   Smoking status: Former   Smokeless tobacco: Never  Scientific laboratory technician Use: Never used  Substance Use Topics   Alcohol use: Not Currently   Drug use: Never     Allergies   Patient has no known allergies.   Review of Systems Review of Systems  HENT:  Positive for nosebleeds.      Physical Exam Triage Vital Signs ED Triage Vitals  Enc Vitals Group     BP 05/03/22 1236 (!) 178/72     Pulse Rate 05/03/22 1236 77     Resp 05/03/22 1236 18     Temp 05/03/22 1236 97.8 F (36.6 C)     Temp  Source 05/03/22 1236 Oral     SpO2 05/03/22 1236 96 %     Weight --      Height --      Head Circumference --      Peak Flow --      Pain Score 05/03/22 1237 0     Pain Loc --      Pain Edu? --      Excl. in [place]? --    No data found.  Updated Vital Signs BP (!) 178/72 (BP Location: Left Arm)   Pulse 77   Temp 97.8 F (36.6 C) (Oral)   Resp 18   SpO2 96%   Visual Acuity Right Eye Distance:   Left Eye Distance:   Bilateral Distance:    Right Eye Near:   Left Eye Near:    Bilateral Near:     Physical Exam Vitals reviewed.  Constitutional:      General: She is not in acute distress.    Appearance: She is not toxic-appearing.  HENT:     Right Ear: Tympanic membrane and ear canal normal.     Left Ear: Tympanic membrane and ear canal normal.     Nose:     Comments: There is dried blood on the medial side of the right nostril.  There is no active bleeding at this  time.    Mouth/Throat:     Mouth: Mucous membranes are moist.     Pharynx: No oropharyngeal exudate or posterior oropharyngeal erythema.  Eyes:     Extraocular Movements: Extraocular movements intact.     Conjunctiva/sclera: Conjunctivae normal.     Pupils: Pupils are equal, round, and reactive to light.  Cardiovascular:     Rate and Rhythm: Normal rate and regular rhythm.     Heart sounds: No murmur heard. Pulmonary:     Effort: Pulmonary effort is normal. No respiratory distress.     Breath sounds: No stridor. No wheezing, rhonchi or rales.  Musculoskeletal:     Cervical back: Neck supple.  Lymphadenopathy:     Cervical: No cervical adenopathy.  Skin:    Capillary Refill: Capillary refill takes less than 2 seconds.     Coloration: Skin is not jaundiced or pale.  Neurological:     General: No focal deficit present.     Mental Status: She is alert and oriented to person, place, and time.  Psychiatric:        Behavior: Behavior normal.      UC Treatments / Results  Labs (all labs ordered are listed, but only abnormal results are displayed) Labs Reviewed - No data to display  EKG   Radiology No results found.  Procedures Procedures (including critical care time)  Medications Ordered in UC Medications - No data to display  Initial Impression / Assessment and Plan / UC Course  I have reviewed the triage vital signs and the nursing notes.  Pertinent labs & imaging results that were available during my care of the patient were reviewed by me and considered in my medical decision making (see chart for details).        I have discussed with her to use saline nose spray and then to apply petroleum jelly inside her nose 2 or 3 times a day.  I have asked her not to pick that scab off.  I discussed with her to pinch her nose if she has any further nosebleeds.  Not actively bleeding at present and having a high blood pressure here,  I do not want to use Afrin and I do not  think is necessary at this time.  Recent labs show normal platelet count.  Her anemia was stable at that time.  Also aspirin is listed in her med list, but she is not taking it. Final Clinical Impressions(s) / UC Diagnoses   Final diagnoses:  Epistaxis     Discharge Instructions      Use saline nasal spray in both sides of your nose 2-3 times daily.  Then apply a small amount of petroleum jelly inside your nostrils, to help the skin there be dry.  Do not pick the scab off.  If you have a brisk nosebleed, then pinch your nose   Check your blood pressure at the store or at home if you can.  Follow-up with Dr. Lanae Crumbly and let them know how it has been running if you can check your blood pressure     ED Prescriptions   None    PDMP not reviewed this encounter.   Barrett Henle, MD 05/03/22 1256

## 2022-05-03 NOTE — Discharge Instructions (Signed)
Use saline nasal spray in both sides of your nose 2-3 times daily.  Then apply a small amount of petroleum jelly inside your nostrils, to help the skin there be dry.  Do not pick the scab off.  If you have a brisk nosebleed, then pinch your nose   Check your blood pressure at the store or at home if you can.  Follow-up with Dr. Lanae Crumbly and let them know how it has been running if you can check your blood pressure

## 2022-05-09 DIAGNOSIS — R296 Repeated falls: Secondary | ICD-10-CM | POA: Insufficient documentation

## 2022-05-09 NOTE — Assessment & Plan Note (Signed)
-  Patient has had multiple falls at home and outside in the past few months, no major injury. -I again encouraged her to buy a walker, to replace her rollator, for better support.  She will ask her daughter to buy 1 for her. -Patient has had physical therapy, and is still doing some at home.

## 2022-05-09 NOTE — Assessment & Plan Note (Addendum)
[id number] stage IV, grade 2-3, ER-/PR- HER2+, with nodal and pulmonary metastases -She initially self palpated a left breast mass around 06/2020. Work-up showed 2 masses in the left breast UOQ and 3 abnormal lymph nodes. Biopsy showed IDC to both masses and biopsied lymph node. -PET scan 08/28/20 showed: hypermetabolism to left breast and axilla, retropectoral and supraclavicular adenopathy, and several 3-6 mm pulmonary nodules. -She started first-line Kadcyla on 09/11/20. She tolerates well with some fatigue, dose decreased with C4.  -She has had good clinical response, breast mass was no longer palpable on physical exam 06/09/21.  -last PET scan in 01/2022 showed two small lung nodule (5-5m), slightly bigger since last scan, no other hypermetabolic lesions.  She is scheduled for next staging CT on 05/26/2022 -last echo in 04/2022 showed normal EF, will continue Kadcyla

## 2022-05-10 NOTE — Progress Notes (Unsigned)
[facility]   [phone] [phone]   Clinic Follow up Note   Patient Care Team: Nolene Ebbs, MD as PCP - General (Internal Medicine) Truitt Merle, MD as Consulting Physician (Medical Oncology)  Date of Service:  05/11/2022  CHIEF COMPLAINT: f/u of  metastatic breast cancer     CURRENT THERAPY:  Kadcyla every 3 weeks, started on 09/11/20      ASSESSMENT:  Christy [name] is a 82 y.o. female with   Malignant neoplasm of upper-outer quadrant of female breast ([place]) cT2N3M1 stage IV, grade 2-3, ER-/PR- HER2+, with nodal and pulmonary metastases -She initially self palpated a left breast mass around 06/2020. Work-up showed 2 masses in the left breast UOQ and 3 abnormal lymph nodes. Biopsy showed IDC to both masses and biopsied lymph node. -PET scan 08/28/20 showed: hypermetabolism to left breast and axilla, retropectoral and supraclavicular adenopathy, and several 3-6 mm pulmonary nodules. -She started first-line Kadcyla on 09/11/20. She tolerates well with some fatigue, dose decreased with C4.  -She has had good clinical response, breast mass was no longer palpable on physical exam 06/09/21.  -last PET scan in 01/2022 showed two small lung nodule (5-17m), slightly bigger since last scan, no other hypermetabolic lesions.  She is scheduled for next staging CT on 05/26/2022 -last echo in 04/2022 showed normal EF, will continue Kadcyla  -due to her frequent fall, I will ordered a brain MRI with and without contrast to rule out a brain metastasis.  Multiple falls -Patient has had multiple falls at home and outside in the past few months, no major injury. -I again encouraged her to buy a walker, to replace her rollator, for better support.  She will ask her daughter to buy 1 for her. -Patient has had physical therapy, and is still doing some at home.      PLAN: -order  brain MRI due to multiple falls, rule out brain mets -restaging  CT scan schedule 3/21, I  will call her on 3/22 with the results  -lab reviewed -proceed with C24 kadcyla today -lab/flush and Kadcyla on 06/01/2022 -I called her daughter TIrene Shipperand left a message about her brain MRI    SUMMARY OF ONCOLOGIC HISTORY: Oncology History Overview Note  Cancer Staging Malignant neoplasm of upper-outer quadrant of female breast ([facility] Staging form: Breast, AJCC 8th Edition - Clinical stage from 07/31/2020: Stage IV (cT2, cN3, cM1, G2, ER-, PR-, HER2+) - Signed by BAlla Feeling NP on 08/31/2020 Stage prefix: Initial diagnosis Nuclear grade: G2 Histologic grading system: 3 grade system    Malignant neoplasm of upper-outer quadrant of female breast ([place]  07/24/2020 Breast [place]  On physical exam, a very firm mass is identified at the 1:30 position of the LEFT breast 5 cm from the nipple.   IMPRESSION: 1. Highly suspicious 4.3 cm mass at the 1:30 position of the LEFT breast and highly suspicious 2.7 cm mass at the 2 o'clock position of the LEFT breast, with the 2 masses encompassing an area measuring at least 6.1 cm. 2. 3 abnormal LEFT axillary lymph nodes with cortical thickening, tissue sampling of 1 of these lymph nodes recommended. 3. Anterior LEFT breast skin thickening nonspecific but may reflect dermal involvement. 4. No mammographic evidence of RIGHT breast malignancy.   07/31/2020 Initial Biopsy   Diagnosis 1. Breast, left, needle core biopsy, 1 :30 pm, 5cmfn - INVASIVE DUCTAL CARCINOMA, SEE COMMENT. 2. Breast, left, needle core biopsy, 2 o'clock, 8cmfn - INVASIVE DUCTAL CARCINOMA, SEE  COMMENT. - DUCTAL CARCINOMA IN SITU. 3. Lymph node, needle/core biopsy, left axillary - METASTATIC CARCINOMA IN A LYMPH NODE. Microscopic Comment 1. and 3. The carcinoma in both parts has a similar morphology and appears grade 2-3. The DCIS in part 2 is high-grade with necrosis. The carcinoma measures 15 mm (part 1) and 14 mm (part 2) in greatest linear extent  2. PROGNOSTIC  INDICATORS Results: IMMUNOHISTOCHEMICAL AND MORPHOMETRIC ANALYSIS PERFORMED MANUALLY The tumor cells are POSITIVE for Her2 (3+). Estrogen Receptor: 0%, NEGATIVE Progesterone Receptor: 0%, NEGATIVE Proliferation Marker Ki67: 20%    07/31/2020 Cancer Staging   Staging form: Breast, AJCC 8th Edition - Clinical stage from 07/31/2020: Stage IV (cT2, cN3, cM1, G2, ER-, PR-, HER2+) - Signed by Alla Feeling, NP on 08/31/2020 Stage prefix: Initial diagnosis Nuclear grade: G2 Histologic grading system: 3 grade system   08/13/2020 Initial Diagnosis   Malignant neoplasm of upper-outer quadrant of female breast ([place])   08/28/2020 PET scan   IMPRESSION: Signs of LEFT breast cancer with LEFT axillary and retropectoral adenopathy as well as LEFT supraclavicular adenopathy.   Signs of pulmonary metastatic disease.   Mild asymmetric uptake in the LEFT as compared to the RIGHT adrenal gland. Equivocal but suspicious, attention on follow-up.   Aortic atherosclerosis.   Cystic changes and potential bronchiectasis in the RIGHT lung base likely post infectious or inflammatory. Correlate with any respiratory symptoms. Comparison with prior imaging may be helpful with attention on follow-up.   08/28/2020 Imaging   BRAIN MRI IMPRESSION: 1. Subacute lacunar infarct of the left basal ganglia with no malignant hemorrhagic transformation or mass effect. Underlying chronic bilateral basal ganglia ischemia. 2. No metastatic disease or other acute intracranial abnormality identified.   08/28/2020 Imaging   Breast MRI   IMPRESSION: 1. 8.1 x 8.1 x 5.1 cm area of biopsy-proven malignancy in the central, upper outer and lower outer quadrants of the left breast. 2. Diffuse anterior left breast skin thickening without abnormal enhancement. This may represent thickening due to lymphedema associated with lymphatic obstruction. 3. Metastatic level 1 and level 2 left axillary adenopathy. 4. No evidence of  malignancy on the right.   09/11/2020 - 11/03/2021 Chemotherapy   Patient is on Treatment Plan : BREAST ADO-Trastuzumab Emtansine (Kadcyla) q21d     09/11/2020 -  Chemotherapy   Patient is on Treatment Plan : BREAST ADO-Trastuzumab Emtansine (Kadcyla) q21d     11/30/2020 Imaging   CT Chest w/o contrast  IMPRESSION: 1. Interval response to therapy. The left breast mass has mildly decreased in size in the interval. 2. Decrease in size of left axillary and left retropectoral lymph nodes. 3. Small right lung nodules stable to improved. 4. Coronary artery calcifications noted. 5. Aortic Atherosclerosis (ICD10-I70.0).   03/04/2021 PET scan   IMPRESSION: 1. Overall marked improvement compared to the prior PET-CT, with resolution of hypermetabolic activity and pathologic enlargement of the previously involved lymph nodes; lack of hypermetabolic activity and nonvisualization of prior right-sided lung nodules, and substantial reduction in size and activity in the left breast primary. 2. New ground-glass opacity medially in the right lower lobe with low-grade metabolic activity, highly likely to be inflammatory (alveolitis) given the morphology and appearance. 3. Other imaging findings of potential clinical significance: Aortic Atherosclerosis (ICD10-I70.0). Coronary atherosclerosis. Mitral valve calcifications. Punctate nonobstructive left renal calculi. Lower lumbar impingement due to chronic spondylosis and degenerative disc disease. Remote left basal ganglia infarct.   06/07/2021 Imaging   EXAM: CT CHEST, ABDOMEN AND PELVIS WITHOUT CONTRAST  IMPRESSION:  1. Scattered tiny bilateral pulmonary nodules are similar to the prior exams, favored to be benign. 2. Otherwise, no evidence of metastatic disease within the chest, abdomen, or pelvis. 3. Right lower lobe ground-glass nodule has resolved since the prior PET 4.  Possible constipation. 5. Coronary artery atherosclerosis. Aortic  Atherosclerosis (ICD10-I70.0). Emphysema (ICD10-J43.9). 6. Left nephrolithiasis.      INTERVAL HISTORY:  Christy [name] is here for a follow up of  metastatic breast cancer    She was last seen by me on 04/20/2022 She presents to the clinic alone. Pt state she  feels fatigue. Pt fell in home and hit her head on the fire extinguisher.Pt state she was reaching for a water bottle. Pt state her daughter called 911. Pt state she had 3 falls. Pt denies feeling dizzy. Pt state her legs are fatigue. Pt was in the ED  On 05/03/2022 for a nose bleed.  All other systems were reviewed with the patient and are negative.  MEDICAL HISTORY:  Past Medical History:  Diagnosis Date   Diabetes mellitus without complication ([place])    Hyperlipidemia    Hypertension     SURGICAL HISTORY: Past Surgical History:  Procedure Laterality Date   FOOT FRACTURE SURGERY Right    IR IMAGING GUIDED PORT INSERTION  09/22/2020   IR RADIOLOGIST EVAL & MGMT  09/25/2020    I have reviewed the social history and family history with the patient and they are unchanged from previous note.  ALLERGIES:  has No Known Allergies.  MEDICATIONS:  Current Outpatient Medications  Medication Sig Dispense Refill   aspirin EC 325 MG tablet Take 1 tablet (325 mg total) by mouth daily. 30 tablet 5   atorvastatin (LIPITOR) 20 MG tablet Take 1 tablet (20 mg total) by mouth daily. 60 tablet 3   lidocaine-prilocaine (EMLA) cream Apply 1 application topically as needed. 30 g 2   metFORMIN (GLUCOPHAGE) 500 MG tablet Take 1 tablet (500 mg total) by mouth 2 (two) times daily with a meal. 60 tablet 1   olmesartan (BENICAR) 40 MG tablet      No current facility-administered medications for this visit.   Facility-Administered Medications Ordered in Other Visits  Medication Dose Route Frequency Provider Last Rate Last Admin   ado-trastuzumab emtansine (KADCYLA) 200 mg in sodium chloride 0.9 % 250 mL chemo infusion  3 mg/kg (Treatment Plan  Recorded) Intravenous Once Truitt Merle, MD 520 mL/hr at 05/11/22 1008 200 mg at 05/11/22 1008   heparin lock flush 100 unit/mL  500 Units Intracatheter Once PRN Truitt Merle, MD       sodium chloride flush (NS) 0.9 % injection 10 mL  10 mL Intracatheter PRN Truitt Merle, MD        PHYSICAL EXAMINATION: ECOG PERFORMANCE STATUS: 2 - Symptomatic, <50% confined to bed  Vitals:   05/11/22 0819  BP: (!) 150/79  Pulse: 82  Resp: 16  Temp: 97.8 F (36.6 C)  SpO2: 100%   Wt Readings from Last 3 Encounters:  05/11/22 154 lb 9.6 oz (70.1 kg)  04/20/22 153 lb 14.4 oz (69.8 kg)  03/30/22 152 lb 6.4 oz (69.1 kg)     GENERAL:alert, no distress and comfortable SKIN: skin color normal, no rashes or significant lesions EYES: normal, Conjunctiva are pink and non-injected, sclera clear  NEURO: alert & oriented x 3 with fluent speech  LABORATORY DATA:  I have reviewed the data as listed    Latest Ref Rng & Units 05/11/2022    8:04  AM 04/20/2022   10:19 AM 03/30/2022   11:30 AM  CBC  WBC 4.0 - 10.5 K/uL 5.2  4.6  4.8   Hemoglobin 12.0 - 15.0 g/dL 9.2  9.1  9.2   Hematocrit 36.0 - 46.0 % 27.3  27.1  26.8   Platelets 150 - 400 K/uL 145  156  146         Latest Ref Rng & Units 05/11/2022    8:04 AM 04/20/2022   10:19 AM 03/30/2022   11:30 AM  CMP  Glucose 70 - 99 mg/dL 103  103  85   BUN 8 - 23 mg/dL '21  18  20   '$ Creatinine 0.44 - 1.00 mg/dL 1.61  1.58  1.66   Sodium 135 - 145 mmol/L 142  142  142   Potassium 3.5 - 5.1 mmol/L 3.5  3.4  3.8   Chloride 98 - 111 mmol/L 111  110  111   CO2 22 - 32 mmol/L '24  26  27   '$ Calcium 8.9 - 10.3 mg/dL 10.3  10.3  10.3   Total Protein 6.5 - 8.1 g/dL 7.0  6.7  6.7   Total Bilirubin 0.3 - 1.2 mg/dL 0.7  0.7  0.6   Alkaline Phos 38 - 126 U/L 95  83  95   AST 15 - 41 U/L 51  43  45   ALT 0 - 44 U/L '26  22  23       '$ RADIOGRAPHIC STUDIES: I have personally reviewed the radiological images as listed and agreed with the findings in the report. No results found.     Orders Placed This Encounter  Procedures   MR Brain W Wo Contrast    Pt has CKD with EGFR around 32, OK to hold iv contrast if needed    Standing Status:   Future    Standing Expiration Date:   05/11/2023    Order Specific Question:   If indicated for the ordered procedure, I authorize the administration of contrast media per Radiology protocol    Answer:   Yes    Order Specific Question:   What is the patient's sedation requirement?    Answer:   No Sedation    Order Specific Question:   Does the patient have a pacemaker or implanted devices?    Answer:   No    Order Specific Question:   Use SRS Protocol?    Answer:   No    Order Specific Question:   Preferred imaging location?    Answer:   [facility] (table limit - 550 lbs)   All questions were answered. The patient knows to call the clinic with any problems, questions or concerns. No barriers to learning was detected. The total time spent in the appointment was 30 minutes.     Truitt Merle, MD 05/11/2022   Felicity Coyer, CMA, am acting as scribe for Truitt Merle, MD.   I have reviewed the above documentation for accuracy and completeness, and I agree with the above.

## 2022-05-11 ENCOUNTER — Inpatient Hospital Stay: Payer: Medicare Other

## 2022-05-11 ENCOUNTER — Encounter: Payer: Self-pay | Admitting: Hematology

## 2022-05-11 ENCOUNTER — Inpatient Hospital Stay (HOSPITAL_BASED_OUTPATIENT_CLINIC_OR_DEPARTMENT_OTHER): Payer: Medicare Other | Admitting: Hematology

## 2022-05-11 ENCOUNTER — Other Ambulatory Visit: Payer: Self-pay

## 2022-05-11 ENCOUNTER — Inpatient Hospital Stay: Payer: Medicare Other | Attending: Nurse Practitioner

## 2022-05-11 VITALS — BP 172/98 | HR 71 | Resp 18

## 2022-05-11 VITALS — BP 150/79 | HR 82 | Temp 97.8°F | Resp 16 | Ht 64.0 in | Wt 154.6 lb

## 2022-05-11 DIAGNOSIS — C50412 Malignant neoplasm of upper-outer quadrant of left female breast: Secondary | ICD-10-CM | POA: Diagnosis not present

## 2022-05-11 DIAGNOSIS — Z8673 Personal history of transient ischemic attack (TIA), and cerebral infarction without residual deficits: Secondary | ICD-10-CM | POA: Diagnosis not present

## 2022-05-11 DIAGNOSIS — R296 Repeated falls: Secondary | ICD-10-CM | POA: Insufficient documentation

## 2022-05-11 DIAGNOSIS — I639 Cerebral infarction, unspecified: Secondary | ICD-10-CM

## 2022-05-11 DIAGNOSIS — M5116 Intervertebral disc disorders with radiculopathy, lumbar region: Secondary | ICD-10-CM | POA: Diagnosis not present

## 2022-05-11 DIAGNOSIS — I2510 Atherosclerotic heart disease of native coronary artery without angina pectoris: Secondary | ICD-10-CM | POA: Insufficient documentation

## 2022-05-11 DIAGNOSIS — Z171 Estrogen receptor negative status [ER-]: Secondary | ICD-10-CM

## 2022-05-11 DIAGNOSIS — Z79899 Other long term (current) drug therapy: Secondary | ICD-10-CM | POA: Insufficient documentation

## 2022-05-11 DIAGNOSIS — Z5112 Encounter for antineoplastic immunotherapy: Secondary | ICD-10-CM | POA: Insufficient documentation

## 2022-05-11 DIAGNOSIS — R2689 Other abnormalities of gait and mobility: Secondary | ICD-10-CM | POA: Diagnosis not present

## 2022-05-11 DIAGNOSIS — M4726 Other spondylosis with radiculopathy, lumbar region: Secondary | ICD-10-CM | POA: Diagnosis not present

## 2022-05-11 DIAGNOSIS — C7800 Secondary malignant neoplasm of unspecified lung: Secondary | ICD-10-CM | POA: Insufficient documentation

## 2022-05-11 DIAGNOSIS — I700 Atherosclerosis of aorta: Secondary | ICD-10-CM | POA: Diagnosis not present

## 2022-05-11 DIAGNOSIS — D649 Anemia, unspecified: Secondary | ICD-10-CM

## 2022-05-11 DIAGNOSIS — N200 Calculus of kidney: Secondary | ICD-10-CM | POA: Insufficient documentation

## 2022-05-11 DIAGNOSIS — Z95828 Presence of other vascular implants and grafts: Secondary | ICD-10-CM

## 2022-05-11 DIAGNOSIS — J439 Emphysema, unspecified: Secondary | ICD-10-CM | POA: Insufficient documentation

## 2022-05-11 LAB — CBC WITH DIFFERENTIAL (CANCER CENTER ONLY)
Abs Immature Granulocytes: 0.01 10*3/uL (ref 0.00–0.07)
Basophils Absolute: 0.1 10*3/uL (ref 0.0–0.1)
Basophils Relative: 1 %
Eosinophils Absolute: 0.3 10*3/uL (ref 0.0–0.5)
Eosinophils Relative: 6 %
HCT: 27.3 % — ABNORMAL LOW (ref 36.0–46.0)
Hemoglobin: 9.2 g/dL — ABNORMAL LOW (ref 12.0–15.0)
Immature Granulocytes: 0 %
Lymphocytes Relative: 39 %
Lymphs Abs: 2 10*3/uL (ref 0.7–4.0)
MCH: 24.4 pg — ABNORMAL LOW (ref 26.0–34.0)
MCHC: 33.7 g/dL (ref 30.0–36.0)
MCV: 72.4 fL — ABNORMAL LOW (ref 80.0–100.0)
Monocytes Absolute: 0.4 10*3/uL (ref 0.1–1.0)
Monocytes Relative: 7 %
Neutro Abs: 2.4 10*3/uL (ref 1.7–7.7)
Neutrophils Relative %: 47 %
Platelet Count: 145 10*3/uL — ABNORMAL LOW (ref 150–400)
RBC: 3.77 MIL/uL — ABNORMAL LOW (ref 3.87–5.11)
RDW: 16.6 % — ABNORMAL HIGH (ref 11.5–15.5)
WBC Count: 5.2 10*3/uL (ref 4.0–10.5)
nRBC: 0 % (ref 0.0–0.2)

## 2022-05-11 LAB — CMP (CANCER CENTER ONLY)
ALT: 26 U/L (ref 0–44)
AST: 51 U/L — ABNORMAL HIGH (ref 15–41)
Albumin: 3.5 g/dL (ref 3.5–5.0)
Alkaline Phosphatase: 95 U/L (ref 38–126)
Anion gap: 7 (ref 5–15)
BUN: 21 mg/dL (ref 8–23)
CO2: 24 mmol/L (ref 22–32)
Calcium: 10.3 mg/dL (ref 8.9–10.3)
Chloride: 111 mmol/L (ref 98–111)
Creatinine: 1.61 mg/dL — ABNORMAL HIGH (ref 0.44–1.00)
GFR, Estimated: 32 mL/min — ABNORMAL LOW (ref >60)
Glucose, Bld: 103 mg/dL — ABNORMAL HIGH (ref 70–99)
Potassium: 3.5 mmol/L (ref 3.5–5.1)
Sodium: 142 mmol/L (ref 135–145)
Total Bilirubin: 0.7 mg/dL (ref 0.3–1.2)
Total Protein: 7 g/dL (ref 6.5–8.1)

## 2022-05-11 LAB — FERRITIN: Ferritin: 28 ng/mL (ref 11–307)

## 2022-05-11 MED ORDER — SODIUM CHLORIDE 0.9 % IV SOLN
3.0000 mg/kg | Freq: Once | INTRAVENOUS | Status: AC
  Administered 2022-05-11: 200 mg via INTRAVENOUS
  Filled 2022-05-11: qty 10

## 2022-05-11 MED ORDER — SODIUM CHLORIDE 0.9% FLUSH
10.0000 mL | Freq: Once | INTRAVENOUS | Status: AC
  Administered 2022-05-11: 10 mL

## 2022-05-11 MED ORDER — HEPARIN SOD (PORK) LOCK FLUSH 100 UNIT/ML IV SOLN
500.0000 [IU] | Freq: Once | INTRAVENOUS | Status: AC | PRN
  Administered 2022-05-11: 500 [IU]

## 2022-05-11 MED ORDER — DIPHENHYDRAMINE HCL 25 MG PO CAPS
50.0000 mg | ORAL_CAPSULE | Freq: Once | ORAL | Status: AC
  Administered 2022-05-11: 50 mg via ORAL
  Filled 2022-05-11: qty 2

## 2022-05-11 MED ORDER — SODIUM CHLORIDE 0.9 % IV SOLN: Freq: Once | INTRAVENOUS | Status: AC

## 2022-05-11 MED ORDER — SODIUM CHLORIDE 0.9% FLUSH
10.0000 mL | INTRAVENOUS | Status: DC | PRN
  Administered 2022-05-11: 10 mL

## 2022-05-11 MED ORDER — ACETAMINOPHEN 325 MG PO TABS
650.0000 mg | ORAL_TABLET | Freq: Once | ORAL | Status: AC
  Administered 2022-05-11: 650 mg via ORAL
  Filled 2022-05-11: qty 2

## 2022-05-11 NOTE — Patient Instructions (Signed)
[facility]  Discharge Instructions: Thank you for choosing [facility] to provide your oncology and hematology care.   If you have a lab appointment with the [facility], please go directly to the [facility] and check in at the registration area.   Wear comfortable clothing and clothing appropriate for easy access to any Portacath or PICC line.   We strive to give you quality time with your provider. You may need to reschedule your appointment if you arrive late (15 or more minutes).  Arriving late affects you and other patients whose appointments are after yours.  Also, if you miss three or more appointments without notifying the office, you may be dismissed from the clinic at the provider's discretion.      For prescription refill requests, have your pharmacy contact our office and allow 72 hours for refills to be completed.    Today you received the following chemotherapy and/or immunotherapy agents Kadcyla      To help prevent nausea and vomiting after your treatment, we encourage you to take your nausea medication as directed.  BELOW ARE SYMPTOMS THAT SHOULD BE REPORTED IMMEDIATELY: *FEVER GREATER THAN 100.4 F (38 C) OR HIGHER *CHILLS OR SWEATING *NAUSEA AND VOMITING THAT IS NOT CONTROLLED WITH YOUR NAUSEA MEDICATION *UNUSUAL SHORTNESS OF BREATH *UNUSUAL BRUISING OR BLEEDING *URINARY PROBLEMS (pain or burning when urinating, or frequent urination) *BOWEL PROBLEMS (unusual diarrhea, constipation, pain near the anus) TENDERNESS IN MOUTH AND THROAT WITH OR WITHOUT PRESENCE OF ULCERS (sore throat, sores in mouth, or a toothache) UNUSUAL RASH, SWELLING OR PAIN  UNUSUAL VAGINAL DISCHARGE OR ITCHING   Items with * indicate a potential emergency and should be followed up as soon as possible or go to the Emergency Department if any problems should occur.  Please show the CHEMOTHERAPY ALERT CARD or IMMUNOTHERAPY ALERT CARD at check-in  to the Emergency Department and triage nurse.  Should you have questions after your visit or need to cancel or reschedule your appointment, please contact [facility]  Dept: [phone]  and follow the prompts.  Office hours are 8:00 a.m. to 4:30 p.m. Monday - Friday. Please note that voicemails left after 4:00 p.m. may not be returned until the following business day.  We are closed weekends and major holidays. You have access to a nurse at all times for urgent questions. Please call the main number to the clinic Dept: [phone] and follow the prompts.   For any non-urgent questions, you may also contact your provider using MyChart. We now offer e-Visits for anyone 25 and older to request care online for non-urgent symptoms. For details visit [email or web address].   Also download the MyChart app! Go to the app store, search "MyChart", open the app, select [facility], and log in with your MyChart username and password.

## 2022-05-12 ENCOUNTER — Telehealth: Payer: Self-pay

## 2022-05-12 NOTE — Telephone Encounter (Signed)
Returned call to patients daughter after receiving call on voice mail wanting [place] to call her. No information was left on the vm just that we needed to call her back. I tried to return the call but got her voicemail so I left a message and my direct line for her to call back.

## 2022-05-26 ENCOUNTER — Inpatient Hospital Stay: Payer: Medicare Other

## 2022-05-26 ENCOUNTER — Ambulatory Visit (HOSPITAL_COMMUNITY): Payer: Medicare Other

## 2022-05-30 ENCOUNTER — Inpatient Hospital Stay: Payer: Medicare Other

## 2022-05-30 ENCOUNTER — Ambulatory Visit (HOSPITAL_COMMUNITY)
Admission: RE | Admit: 2022-05-30 | Discharge: 2022-05-30 | Disposition: A | Discharge status: Home / Self Care | Payer: Medicare Other | Source: Ambulatory Visit | Attending: Hematology | Admitting: Hematology

## 2022-05-30 DIAGNOSIS — R296 Repeated falls: Secondary | ICD-10-CM | POA: Diagnosis present

## 2022-05-30 DIAGNOSIS — C50412 Malignant neoplasm of upper-outer quadrant of left female breast: Secondary | ICD-10-CM | POA: Diagnosis present

## 2022-05-30 DIAGNOSIS — Z171 Estrogen receptor negative status [ER-]: Secondary | ICD-10-CM | POA: Diagnosis present

## 2022-05-30 MED ORDER — GADOBUTROL 1 MMOL/ML IV SOLN
7.0000 mL | Freq: Once | INTRAVENOUS | Status: AC | PRN
  Administered 2022-05-30: 7 mL via INTRAVENOUS

## 2022-06-01 ENCOUNTER — Inpatient Hospital Stay: Payer: Medicare Other

## 2022-06-01 ENCOUNTER — Ambulatory Visit: Payer: Medicare Other | Admitting: Hematology

## 2022-06-01 ENCOUNTER — Other Ambulatory Visit: Payer: Self-pay

## 2022-06-01 ENCOUNTER — Inpatient Hospital Stay (HOSPITAL_BASED_OUTPATIENT_CLINIC_OR_DEPARTMENT_OTHER): Payer: Medicare Other | Admitting: Physician Assistant

## 2022-06-01 ENCOUNTER — Telehealth: Payer: Self-pay | Admitting: *Deleted

## 2022-06-01 ENCOUNTER — Other Ambulatory Visit: Payer: Medicare Other

## 2022-06-01 ENCOUNTER — Ambulatory Visit: Payer: Medicare Other

## 2022-06-01 VITALS — BP 150/62 | HR 68 | Temp 97.9°F | Resp 18

## 2022-06-01 DIAGNOSIS — D649 Anemia, unspecified: Secondary | ICD-10-CM

## 2022-06-01 DIAGNOSIS — Z171 Estrogen receptor negative status [ER-]: Secondary | ICD-10-CM

## 2022-06-01 DIAGNOSIS — Z95828 Presence of other vascular implants and grafts: Secondary | ICD-10-CM

## 2022-06-01 DIAGNOSIS — R296 Repeated falls: Secondary | ICD-10-CM | POA: Diagnosis not present

## 2022-06-01 DIAGNOSIS — C50412 Malignant neoplasm of upper-outer quadrant of left female breast: Secondary | ICD-10-CM

## 2022-06-01 LAB — CMP (CANCER CENTER ONLY)
ALT: 24 U/L (ref 0–44)
AST: 61 U/L — ABNORMAL HIGH (ref 15–41)
Albumin: 3.4 g/dL — ABNORMAL LOW (ref 3.5–5.0)
Alkaline Phosphatase: 78 U/L (ref 38–126)
Anion gap: 4 — ABNORMAL LOW (ref 5–15)
BUN: 18 mg/dL (ref 8–23)
CO2: 26 mmol/L (ref 22–32)
Calcium: 10.1 mg/dL (ref 8.9–10.3)
Chloride: 112 mmol/L — ABNORMAL HIGH (ref 98–111)
Creatinine: 1.71 mg/dL — ABNORMAL HIGH (ref 0.44–1.00)
GFR, Estimated: 30 mL/min — ABNORMAL LOW (ref >60)
Glucose, Bld: 94 mg/dL (ref 70–99)
Potassium: 3.5 mmol/L (ref 3.5–5.1)
Sodium: 142 mmol/L (ref 135–145)
Total Bilirubin: 0.8 mg/dL (ref 0.3–1.2)
Total Protein: 6.8 g/dL (ref 6.5–8.1)

## 2022-06-01 LAB — CBC WITH DIFFERENTIAL (CANCER CENTER ONLY)
Abs Immature Granulocytes: 0.01 10*3/uL (ref 0.00–0.07)
Basophils Absolute: 0 10*3/uL (ref 0.0–0.1)
Basophils Relative: 1 %
Eosinophils Absolute: 0.3 10*3/uL (ref 0.0–0.5)
Eosinophils Relative: 6 %
HCT: 28 % — ABNORMAL LOW (ref 36.0–46.0)
Hemoglobin: 9.2 g/dL — ABNORMAL LOW (ref 12.0–15.0)
Immature Granulocytes: 0 %
Lymphocytes Relative: 46 %
Lymphs Abs: 2 10*3/uL (ref 0.7–4.0)
MCH: 24.1 pg — ABNORMAL LOW (ref 26.0–34.0)
MCHC: 32.9 g/dL (ref 30.0–36.0)
MCV: 73.5 fL — ABNORMAL LOW (ref 80.0–100.0)
Monocytes Absolute: 0.4 10*3/uL (ref 0.1–1.0)
Monocytes Relative: 8 %
Neutro Abs: 1.7 10*3/uL (ref 1.7–7.7)
Neutrophils Relative %: 39 %
Platelet Count: 130 10*3/uL — ABNORMAL LOW (ref 150–400)
RBC: 3.81 MIL/uL — ABNORMAL LOW (ref 3.87–5.11)
RDW: 17 % — ABNORMAL HIGH (ref 11.5–15.5)
WBC Count: 4.4 10*3/uL (ref 4.0–10.5)
nRBC: 0 % (ref 0.0–0.2)

## 2022-06-01 LAB — IRON AND IRON BINDING CAPACITY (CC-WL,HP ONLY)
Iron: 41 ug/dL (ref 28–170)
Saturation Ratios: 9 % — ABNORMAL LOW (ref 10.4–31.8)
TIBC: 470 ug/dL — ABNORMAL HIGH (ref 250–450)
UIBC: 429 ug/dL (ref 148–442)

## 2022-06-01 LAB — FERRITIN: Ferritin: 30 ng/mL (ref 11–307)

## 2022-06-01 MED ORDER — SODIUM CHLORIDE 0.9 % IV SOLN
3.0000 mg/kg | Freq: Once | INTRAVENOUS | Status: AC
  Administered 2022-06-01: 200 mg via INTRAVENOUS
  Filled 2022-06-01: qty 10

## 2022-06-01 MED ORDER — HEPARIN SOD (PORK) LOCK FLUSH 100 UNIT/ML IV SOLN
500.0000 [IU] | Freq: Once | INTRAVENOUS | Status: AC | PRN
  Administered 2022-06-01: 500 [IU]

## 2022-06-01 MED ORDER — SODIUM CHLORIDE 0.9% FLUSH
10.0000 mL | Freq: Once | INTRAVENOUS | Status: AC
  Administered 2022-06-01: 10 mL

## 2022-06-01 MED ORDER — SODIUM CHLORIDE 0.9 % IV SOLN: Freq: Once | INTRAVENOUS | Status: AC

## 2022-06-01 MED ORDER — SODIUM CHLORIDE 0.9% FLUSH
10.0000 mL | INTRAVENOUS | Status: DC | PRN
  Administered 2022-06-01: 10 mL

## 2022-06-01 MED ORDER — DIPHENHYDRAMINE HCL 25 MG PO CAPS
50.0000 mg | ORAL_CAPSULE | Freq: Once | ORAL | Status: AC
  Administered 2022-06-01: 50 mg via ORAL
  Filled 2022-06-01: qty 2

## 2022-06-01 MED ORDER — FERROUS SULFATE 325 (65 FE) MG PO TBEC: 325.0000 mg | DELAYED_RELEASE_TABLET | Freq: Every day | ORAL | 3 refills | Status: DC

## 2022-06-01 MED ORDER — ACETAMINOPHEN 325 MG PO TABS
650.0000 mg | ORAL_TABLET | Freq: Once | ORAL | Status: AC
  Administered 2022-06-01: 650 mg via ORAL
  Filled 2022-06-01: qty 2

## 2022-06-01 NOTE — Progress Notes (Signed)
Correction to previous note - Patient has arrived for her appointments.

## 2022-06-01 NOTE — Telephone Encounter (Signed)
PC to patient regarding missed appointments this a.m., no answer, unable to leave message because voice mailbox is full.  Scheduling message sent to contact patient to reschedule.

## 2022-06-01 NOTE — Patient Instructions (Signed)
[facility]  Discharge Instructions: Thank you for choosing [facility] to provide your oncology and hematology care.   If you have a lab appointment with the Cancer Center, please go directly to the Cancer Center and check in at the registration area.   Wear comfortable clothing and clothing appropriate for easy access to any Portacath or PICC line.   We strive to give you quality time with your provider. You may need to reschedule your appointment if you arrive late (15 or more minutes).  Arriving late affects you and other patients whose appointments are after yours.  Also, if you miss three or more appointments without notifying the office, you may be dismissed from the clinic at the provider's discretion.      For prescription refill requests, have your pharmacy contact our office and allow 72 hours for refills to be completed.    Today you received the following chemotherapy and/or immunotherapy agents: Kadcyla.       To help prevent nausea and vomiting after your treatment, we encourage you to take your nausea medication as directed.  BELOW ARE SYMPTOMS THAT SHOULD BE REPORTED IMMEDIATELY: *FEVER GREATER THAN 100.4 F (38 C) OR HIGHER *CHILLS OR SWEATING *NAUSEA AND VOMITING THAT IS NOT CONTROLLED WITH YOUR NAUSEA MEDICATION *UNUSUAL SHORTNESS OF BREATH *UNUSUAL BRUISING OR BLEEDING *URINARY PROBLEMS (pain or burning when urinating, or frequent urination) *BOWEL PROBLEMS (unusual diarrhea, constipation, pain near the anus) TENDERNESS IN MOUTH AND THROAT WITH OR WITHOUT PRESENCE OF ULCERS (sore throat, sores in mouth, or a toothache) UNUSUAL RASH, SWELLING OR PAIN  UNUSUAL VAGINAL DISCHARGE OR ITCHING   Items with * indicate a potential emergency and should be followed up as soon as possible or go to the Emergency Department if any problems should occur.  Please show the CHEMOTHERAPY ALERT CARD or IMMUNOTHERAPY ALERT CARD at  check-in to the Emergency Department and triage nurse.  Should you have questions after your visit or need to cancel or reschedule your appointment, please contact [facility]  Dept: [phone]  and follow the prompts.  Office hours are 8:00 a.m. to 4:30 p.m. Monday - Friday. Please note that voicemails left after 4:00 p.m. may not be returned until the following business day.  We are closed weekends and major holidays. You have access to a nurse at all times for urgent questions. Please call the main number to the clinic Dept: [phone] and follow the prompts.   For any non-urgent questions, you may also contact your provider using MyChart. We now offer e-Visits for anyone 18 and older to request care online for non-urgent symptoms. For details visit [email or web address].   Also download the MyChart app! Go to the app store, search "MyChart", open the app, select , and log in with your MyChart username and password.   

## 2022-06-01 NOTE — Progress Notes (Signed)
[place]   [phone] [phone]   Clinic Follow up Note   Patient Care Team: Nolene Ebbs, MD as PCP - General (Internal Medicine) Truitt Merle, MD as Consulting Physician (Medical Oncology)  Date of Service:  06/01/2022  CHIEF COMPLAINT: f/u of  metastatic breast cancer     CURRENT THERAPY:  Kadcyla every 3 weeks, started on 09/11/20      SUMMARY OF ONCOLOGIC HISTORY: Oncology History Overview Note  Cancer Staging Malignant neoplasm of upper-outer quadrant of female breast [facility]) Staging form: Breast, AJCC 8th Edition - Clinical stage from 07/31/2020: Stage IV (cT2, cN3, cM1, G2, ER-, PR-, HER2+) - Signed by Alla Feeling, NP on 08/31/2020 Stage prefix: Initial diagnosis Nuclear grade: G2 Histologic grading system: 3 grade system    Malignant neoplasm of upper-outer quadrant of female breast ([place])  07/24/2020 Breast US   On physical exam, a very firm mass is identified at the 1:30 position of the LEFT breast 5 cm from the nipple.   IMPRESSION: 1. Highly suspicious 4.3 cm mass at the 1:30 position of the LEFT breast and highly suspicious 2.7 cm mass at the 2 o'clock position of the LEFT breast, with the 2 masses encompassing an area measuring at least 6.1 cm. 2. 3 abnormal LEFT axillary lymph nodes with cortical thickening, tissue sampling of 1 of these lymph nodes recommended. 3. Anterior LEFT breast skin thickening nonspecific but may reflect dermal involvement. 4. No mammographic evidence of RIGHT breast malignancy.   07/31/2020 Initial Biopsy   Diagnosis 1. Breast, left, needle core biopsy, 1 :30 pm, 5cmfn - INVASIVE DUCTAL CARCINOMA, SEE COMMENT. 2. Breast, left, needle core biopsy, 2 o'clock, 8cmfn - INVASIVE DUCTAL CARCINOMA, SEE COMMENT. - DUCTAL CARCINOMA IN SITU. 3. Lymph node, needle/core biopsy, left axillary - METASTATIC CARCINOMA IN A LYMPH NODE. Microscopic Comment 1. and 3. The carcinoma in both parts has a similar  morphology and appears grade 2-3. The DCIS in part 2 is high-grade with necrosis. The carcinoma measures 15 mm (part 1) and 14 mm (part 2) in greatest linear extent  2. PROGNOSTIC INDICATORS Results: IMMUNOHISTOCHEMICAL AND MORPHOMETRIC ANALYSIS PERFORMED MANUALLY The tumor cells are POSITIVE for Her2 (3+). Estrogen Receptor: 0%, NEGATIVE Progesterone Receptor: 0%, NEGATIVE Proliferation Marker Ki67: 20%    07/31/2020 Cancer Staging   Staging form: Breast, AJCC 8th Edition - Clinical stage from 07/31/2020: Stage IV (cT2, cN3, cM1, G2, ER-, PR-, HER2+) - Signed by Alla Feeling, NP on 08/31/2020 Stage prefix: Initial diagnosis Nuclear grade: G2 Histologic grading system: 3 grade system   08/13/2020 Initial Diagnosis   Malignant neoplasm of upper-outer quadrant of female breast ([place])   08/28/2020 PET scan   IMPRESSION: Signs of LEFT breast cancer with LEFT axillary and retropectoral adenopathy as well as LEFT supraclavicular adenopathy.   Signs of pulmonary metastatic disease.   Mild asymmetric uptake in the LEFT as compared to the RIGHT adrenal gland. Equivocal but suspicious, attention on follow-up.   Aortic atherosclerosis.   Cystic changes and potential bronchiectasis in the RIGHT lung base likely post infectious or inflammatory. Correlate with any respiratory symptoms. Comparison with prior imaging may be helpful with attention on follow-up.   08/28/2020 Imaging   BRAIN MRI IMPRESSION: 1. Subacute lacunar infarct of the left basal ganglia with no malignant hemorrhagic transformation or mass effect. Underlying chronic bilateral basal ganglia ischemia. 2. No metastatic disease or other acute intracranial abnormality identified.   08/28/2020 Imaging   Breast MRI   IMPRESSION: 1.  8.1 x 8.1 x 5.1 cm area of biopsy-proven malignancy in the central, upper outer and lower outer quadrants of the left breast. 2. Diffuse anterior left breast skin thickening without  abnormal enhancement. This may represent thickening due to lymphedema associated with lymphatic obstruction. 3. Metastatic level 1 and level 2 left axillary adenopathy. 4. No evidence of malignancy on the right.   09/11/2020 - 11/03/2021 Chemotherapy   Patient is on Treatment Plan : BREAST ADO-Trastuzumab Emtansine (Kadcyla) q21d     09/11/2020 -  Chemotherapy   Patient is on Treatment Plan : BREAST ADO-Trastuzumab Emtansine (Kadcyla) q21d     11/30/2020 Imaging   CT Chest w/o contrast  IMPRESSION: 1. Interval response to therapy. The left breast mass has mildly decreased in size in the interval. 2. Decrease in size of left axillary and left retropectoral lymph nodes. 3. Small right lung nodules stable to improved. 4. Coronary artery calcifications noted. 5. Aortic Atherosclerosis (ICD10-I70.0).   03/04/2021 PET scan   IMPRESSION: 1. Overall marked improvement compared to the prior PET-CT, with resolution of hypermetabolic activity and pathologic enlargement of the previously involved lymph nodes; lack of hypermetabolic activity and nonvisualization of prior right-sided lung nodules, and substantial reduction in size and activity in the left breast primary. 2. New ground-glass opacity medially in the right lower lobe with low-grade metabolic activity, highly likely to be inflammatory (alveolitis) given the morphology and appearance. 3. Other imaging findings of potential clinical significance: Aortic Atherosclerosis (ICD10-I70.0). Coronary atherosclerosis. Mitral valve calcifications. Punctate nonobstructive left renal calculi. Lower lumbar impingement due to chronic spondylosis and degenerative disc disease. Remote left basal ganglia infarct.   06/07/2021 Imaging   EXAM: CT CHEST, ABDOMEN AND PELVIS WITHOUT CONTRAST  IMPRESSION: 1. Scattered tiny bilateral pulmonary nodules are similar to the prior exams, favored to be benign. 2. Otherwise, no evidence of metastatic disease within  the chest, abdomen, or pelvis. 3. Right lower lobe ground-glass nodule has resolved since the prior PET 4.  Possible constipation. 5. Coronary artery atherosclerosis. Aortic Atherosclerosis (ICD10-I70.0). Emphysema (ICD10-J43.9). 6. Left nephrolithiasis.      INTERVAL HISTORY:  Christy [name] is here for a follow up of  metastatic breast cancer    She was last seen by Dr. Burr Medico on 05/11/2022 She presents to the clinic alone.  Mr. Reburn reports that he has more fatigue but is able to complete his ADLs. She reports she still having falls but adds it is due to poor balance and tripping over things. She denies nausea, vomiting o r abdominal pain. She reports frequent bowel movements with small volume with each episode. She denies easy bruising or signs of bleeding. She denies fevers, chills, sweats, shortness of breath, chest pain or cough. She has no other complaints.   All other systems were reviewed with the patient and are negative.  MEDICAL HISTORY:  Past Medical History:  Diagnosis Date   Diabetes mellitus without complication ([place])    Hyperlipidemia    Hypertension     SURGICAL HISTORY: Past Surgical History:  Procedure Laterality Date   FOOT FRACTURE SURGERY Right    IR IMAGING GUIDED PORT INSERTION  09/22/2020   IR RADIOLOGIST EVAL & MGMT  09/25/2020    I have reviewed the social history and family history with the patient and they are unchanged from previous note.  ALLERGIES:  has No Known Allergies.  MEDICATIONS:  Current Outpatient Medications  Medication Sig Dispense Refill   aspirin EC 325 MG tablet Take 1 tablet (325 mg total)  by mouth daily. 30 tablet 5   atorvastatin (LIPITOR) 20 MG tablet Take 1 tablet (20 mg total) by mouth daily. 60 tablet 3   lidocaine-prilocaine (EMLA) cream Apply 1 application topically as needed. 30 g 2   metFORMIN (GLUCOPHAGE) 500 MG tablet Take 1 tablet (500 mg total) by mouth 2 (two) times daily with a meal. 60 tablet 1   olmesartan  (BENICAR) 40 MG tablet      No current facility-administered medications for this visit.    PHYSICAL EXAMINATION: ECOG PERFORMANCE STATUS: 2 - Symptomatic, <50% confined to bed  There were no vitals filed for this visit.  Wt Readings from Last 3 Encounters:  05/11/22 154 lb 9.6 oz (70.1 kg)  04/20/22 153 lb 14.4 oz (69.8 kg)  03/30/22 152 lb 6.4 oz (69.1 kg)     Constitutional: Oriented to person, place, and time and well-developed, well-nourished, and in no distress.  HENT:  Head: Normocephalic and atraumatic.  Eyes: Conjunctivae are normal. Right eye exhibits no discharge. Left eye exhibits no discharge. No scleral icterus.  Neck: Normal range of motion. Neck supple.   Cardiovascular: Normal rate, regular rhythm, normal heart sounds  Pulmonary/Chest: Effort normal and breath sounds normal. No respiratory distress. No wheezes. No rales.  Musculoskeletal: Normal range of motion. Exhibits no edema.  Lymphadenopathy: No cervical adenopathy.  Skin: Skin is warm and dry. No rash noted. Not diaphoretic. No erythema. No pallor.    LABORATORY DATA:  I have reviewed the data as listed    Latest Ref Rng & Units 06/01/2022    9:36 AM 05/11/2022    8:04 AM 04/20/2022   10:19 AM  CBC  WBC 4.0 - 10.5 K/uL 4.4  5.2  4.6   Hemoglobin 12.0 - 15.0 g/dL 9.2  9.2  9.1   Hematocrit 36.0 - 46.0 % 28.0  27.3  27.1   Platelets 150 - 400 K/uL 130  145  156         Latest Ref Rng & Units 06/01/2022    9:36 AM 05/11/2022    8:04 AM 04/20/2022   10:19 AM  CMP  Glucose 70 - 99 mg/dL 94  103  103   BUN 8 - 23 mg/dL 18  21  18    Creatinine 0.44 - 1.00 mg/dL 1.71  1.61  1.58   Sodium 135 - 145 mmol/L 142  142  142   Potassium 3.5 - 5.1 mmol/L 3.5  3.5  3.4   Chloride 98 - 111 mmol/L 112  111  110   CO2 22 - 32 mmol/L 26  24  26    Calcium 8.9 - 10.3 mg/dL 10.1  10.3  10.3   Total Protein 6.5 - 8.1 g/dL 6.8  7.0  6.7   Total Bilirubin 0.3 - 1.2 mg/dL 0.8  0.7  0.7   Alkaline Phos 38 - 126 U/L 78   95  83   AST 15 - 41 U/L 61  51  43   ALT 0 - 44 U/L 24  26  22        RADIOGRAPHIC STUDIES: I have personally reviewed the radiological images as listed and agreed with the findings in the report. No results found.     ASSESSMENT:  SUMI AX is a 82 y.o. female with   Malignant neoplasm of upper-outer quadrant of female breast ([place]) [id number] stage IV, grade 2-3, ER-/PR- HER2+, with nodal and pulmonary metastases -She initially self palpated a left breast mass around 06/2020. Work-up  showed 2 masses in the left breast UOQ and 3 abnormal lymph nodes. Biopsy showed IDC to both masses and biopsied lymph node. -PET scan 08/28/20 showed: hypermetabolism to left breast and axilla, retropectoral and supraclavicular adenopathy, and several 3-6 mm pulmonary nodules. -She started first-line Kadcyla on 09/11/20. She tolerates well with some fatigue, dose decreased with C4.  -She has had good clinical response, breast mass was no longer palpable on physical exam 06/09/21.  -last PET scan in 01/2022 showed two small lung nodule (5-59mm), slightly bigger since last scan, no other hypermetabolic lesions.    Multiple falls -Patient has had multiple falls at home and outside in the past few months, no major injury. -Patient has had physical therapy, and is still doing some at home. -MRI Brain from 05/30/2022 showed no evidence of brian metastases. -Will send referral to Dr. Mickeal Skinner to do a further neuro evaluation     PLAN: -Due for Kadcyla therapy today -Labs from today were reviewed and adequate for treatment. WBC 4.4, Hgb 9.2, MCV 73.5, Plt 130K, creatinine and LFTs overall stable.  -Iron panel confirmed deficiency so will start PO iron therapy -Restaging CT CAP scheduled for 06/15/2022 -RTC in 3 weeks for labs, follow up visit with Dr. Burr Medico before next cycle of Kadcyla.   Orders Placed This Encounter  Procedures   Ferritin    Standing Status:   Future    Number of Occurrences:   1     Standing Expiration Date:   06/01/2023   Iron and Iron Binding Capacity ([facility] only)    Standing Status:   Future    Number of Occurrences:   1    Standing Expiration Date:   06/01/2023   All questions were answered. The patient knows to call the clinic with any problems, questions or concerns. No barriers to learning was detected.  I have spent a total of 30 minutes minutes of face-to-face and non-face-to-face time, preparing to see the patient, performing a medically appropriate examination, counseling and educating the patient, ordering medications/tests/procedures,documenting clinical information in the electronic health record, and care coordination.   Dede Query PA-C Dept of Hematology and [place] at [facility] Phone: [phone]

## 2022-06-02 LAB — CANCER ANTIGEN 27.29: CA 27.29: 28.8 U/mL (ref 0.0–38.6)

## 2022-06-06 ENCOUNTER — Telehealth: Payer: Self-pay

## 2022-06-06 NOTE — Telephone Encounter (Signed)
Attempted to call pt x 2 but was able to conatc daughter to let her know: Per Dede Query [facility] that pt's iron is low so we will start her on iron pills. Prescription sent to pharmacy. Please encourage her to take stool softeners if she gets constipated.  Pt's daughter acknowledged information and verbalized understanding.

## 2022-06-07 ENCOUNTER — Other Ambulatory Visit: Payer: Self-pay

## 2022-06-10 ENCOUNTER — Encounter: Payer: Self-pay | Admitting: Hematology

## 2022-06-15 ENCOUNTER — Ambulatory Visit (HOSPITAL_COMMUNITY)
Admission: RE | Admit: 2022-06-15 | Discharge: 2022-06-15 | Disposition: A | Discharge status: Home / Self Care | Payer: Medicare HMO | Source: Ambulatory Visit | Attending: Hematology | Admitting: Hematology

## 2022-06-15 DIAGNOSIS — C50412 Malignant neoplasm of upper-outer quadrant of left female breast: Secondary | ICD-10-CM | POA: Diagnosis present

## 2022-06-15 DIAGNOSIS — Z171 Estrogen receptor negative status [ER-]: Secondary | ICD-10-CM | POA: Insufficient documentation

## 2022-06-21 ENCOUNTER — Inpatient Hospital Stay: Payer: Medicare HMO | Attending: Nurse Practitioner | Admitting: Internal Medicine

## 2022-06-21 ENCOUNTER — Inpatient Hospital Stay: Payer: Medicare HMO

## 2022-06-21 ENCOUNTER — Other Ambulatory Visit: Payer: Self-pay

## 2022-06-21 VITALS — BP 155/67 | HR 77 | Temp 97.9°F | Resp 18 | Ht 64.0 in | Wt 155.0 lb

## 2022-06-21 DIAGNOSIS — C50412 Malignant neoplasm of upper-outer quadrant of left female breast: Secondary | ICD-10-CM | POA: Diagnosis not present

## 2022-06-21 DIAGNOSIS — I639 Cerebral infarction, unspecified: Secondary | ICD-10-CM

## 2022-06-21 DIAGNOSIS — Z79899 Other long term (current) drug therapy: Secondary | ICD-10-CM | POA: Diagnosis not present

## 2022-06-21 DIAGNOSIS — J479 Bronchiectasis, uncomplicated: Secondary | ICD-10-CM | POA: Diagnosis not present

## 2022-06-21 DIAGNOSIS — I3481 Nonrheumatic mitral (valve) annulus calcification: Secondary | ICD-10-CM | POA: Insufficient documentation

## 2022-06-21 DIAGNOSIS — I700 Atherosclerosis of aorta: Secondary | ICD-10-CM | POA: Insufficient documentation

## 2022-06-21 DIAGNOSIS — N200 Calculus of kidney: Secondary | ICD-10-CM | POA: Diagnosis not present

## 2022-06-21 DIAGNOSIS — R296 Repeated falls: Secondary | ICD-10-CM | POA: Diagnosis not present

## 2022-06-21 DIAGNOSIS — N281 Cyst of kidney, acquired: Secondary | ICD-10-CM | POA: Insufficient documentation

## 2022-06-21 DIAGNOSIS — Z171 Estrogen receptor negative status [ER-]: Secondary | ICD-10-CM | POA: Diagnosis not present

## 2022-06-21 DIAGNOSIS — M5116 Intervertebral disc disorders with radiculopathy, lumbar region: Secondary | ICD-10-CM | POA: Diagnosis not present

## 2022-06-21 DIAGNOSIS — C7800 Secondary malignant neoplasm of unspecified lung: Secondary | ICD-10-CM | POA: Diagnosis not present

## 2022-06-21 DIAGNOSIS — I6381 Other cerebral infarction due to occlusion or stenosis of small artery: Secondary | ICD-10-CM | POA: Diagnosis not present

## 2022-06-21 DIAGNOSIS — Z5112 Encounter for antineoplastic immunotherapy: Secondary | ICD-10-CM | POA: Diagnosis present

## 2022-06-21 DIAGNOSIS — J439 Emphysema, unspecified: Secondary | ICD-10-CM | POA: Diagnosis not present

## 2022-06-21 DIAGNOSIS — I2510 Atherosclerotic heart disease of native coronary artery without angina pectoris: Secondary | ICD-10-CM | POA: Diagnosis not present

## 2022-06-21 DIAGNOSIS — E119 Type 2 diabetes mellitus without complications: Secondary | ICD-10-CM | POA: Insufficient documentation

## 2022-06-21 DIAGNOSIS — Z8673 Personal history of transient ischemic attack (TIA), and cerebral infarction without residual deficits: Secondary | ICD-10-CM | POA: Insufficient documentation

## 2022-06-21 DIAGNOSIS — M4726 Other spondylosis with radiculopathy, lumbar region: Secondary | ICD-10-CM | POA: Diagnosis not present

## 2022-06-21 MED ORDER — ASPIRIN 325 MG PO TBEC: 325.0000 mg | DELAYED_RELEASE_TABLET | Freq: Every day | ORAL | 3 refills | Status: DC

## 2022-06-21 NOTE — Assessment & Plan Note (Addendum)
[id number] stage IV, grade 2-3, ER-/PR- HER2+, with nodal and pulmonary metastases -She initially self palpated a left breast mass around 06/2020. Work-up showed 2 masses in the left breast UOQ and 3 abnormal lymph nodes. Biopsy showed IDC to both masses and biopsied lymph node. -PET scan 08/28/20 showed: hypermetabolism to left breast and axilla, retropectoral and supraclavicular adenopathy, and several 3-6 mm pulmonary nodules. -She started first-line Kadcyla on 09/11/20. She tolerates well with some fatigue, dose decreased with C4.  -She has had good clinical response, breast mass was no longer palpable on physical exam 06/09/21.  -last PET scan in 01/2022 showed two small lung nodule (5-55mm), slightly bigger since last scan, no other hypermetabolic lesions.   -Her restaging CT scan from June 17, 2022 showed multiple enlarging lung nodules, largest measuring 1 cm, previously 0.6 cm.  This is highly concerning for pulmonary metastasis with slight disease progression. No other new lesions -We discussed treatment options, such as enhertu, neratinib, tucatinib etc.  Due to her advanced age, multiple falls, history of strokes, she is not a candidate for intravenous or oral cytotoxic chemotherapy. -I also discussed the option of palliative care and hospice, due to her advanced age and limited performance status.

## 2022-06-21 NOTE — Progress Notes (Unsigned)
[facility]   [phone] [phone]   Clinic Follow up Note   Patient Care Team: [name], MD as PCP - General (Internal Medicine) Malachy Mood, MD as Consulting Physician (Medical Oncology)  Date of Service:  06/22/2022  CHIEF COMPLAINT: f/u of  metastatic breast cancer       CURRENT THERAPY:  Kadcyla every 3 weeks, started on 09/11/20   Tucatinib -pending  ASSESSMENT:  Christy [name] is a 82 y.o. 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 Work-up showed 2 masses in the left breast UOQ and 3 abnormal lymph nodes. Biopsy showed IDC to both masses and biopsied lymph node. -PET scan 08/28/20 showed: hypermetabolism to left breast and axilla, retropectoral and supraclavicular adenopathy, and several 3-6 mm pulmonary nodules. -She started first-line Kadcyla on 09/11/20. She tolerates well with some fatigue, dose decreased with C4.  -She has had good clinical response, breast mass was no longer palpable on physical exam 06/09/21.  -last PET scan in 01/2022 showed two small lung nodule (5-16mm), slightly bigger since last scan, no other hypermetabolic lesions.   -Her restaging CT scan from June 17, 2022 showed multiple enlarging lung nodules, largest measuring 1 cm, previously 0.6 cm.  This is highly concerning for pulmonary metastasis with slight disease progression. No other new lesions -We discussed treatment options, such as enhertu, neratinib, tucatinib etc.  Due to her advanced age, multiple falls, history of strokes, she is not a candidate for intravenous or oral cytotoxic chemotherapy. -I also discussed the option of palliative care and hospice, due to her advanced age and limited performance status. -after detailed discussion with pt and her daughter, pt wishes to proceed  with more treatment but avoid toxic chemotherapy.  She is not a good candidate for oral Xeloda, is tolerating Kadcyla well, and is scheduled mild progression.  Based on the [id number] trial, I will continue Kadcyla, and oral Tucatinib.  I will start her Tucatinib at half dose 150 mg twice daily for the first month, if she tolerated well, will increase dose the second months.  Potential benefit and side effect discussed with patient and her daughter, she agrees to proceed.  Multiple falls -brain MRI 05/30/2022 showed prior bilateral basal ganglia lacunar infarcts  -I encouraged her to use walker -She previously had a physical therapy, and declined home PT for now.     PLAN: -Discuss CT scan findings which showed mild progression in lungs -Discuss chemo regiment, Oral antibody and its side effect or continue Kadacyla  -proceed with Kadcyla today and will continue  -Pt agreed to tucatinib  150 mg oral medicine 2 x daily to see how pt tolerate. -f/u in 3 weeks    SUMMARY OF ONCOLOGIC HISTORY: Oncology History Overview Note  Cancer Staging Malignant neoplasm of upper-outer quadrant of female breast [facility]) Staging form: Breast, AJCC 8th Edition - Clinical stage from 07/31/2020: Stage IV (cT2, cN3, cM1, G2, ER-, PR-, HER2+) - Signed by Pollyann Samples, NP on 08/31/2020 Stage prefix: Initial diagnosis Nuclear grade: G2 Histologic grading system: 3 grade system    Malignant neoplasm of upper-outer quadrant of female breast  07/24/2020 Breast US   On physical exam, a very firm mass is identified at the 1:30 position of the LEFT breast 5 cm from the nipple.   IMPRESSION: 1. Highly suspicious 4.3  cm mass at the 1:30 position of the LEFT breast and highly suspicious 2.7 cm mass at the 2 o'clock position of the LEFT breast, with the 2 masses encompassing an area measuring at least 6.1 cm. 2. 3 abnormal LEFT axillary lymph nodes with cortical thickening, tissue sampling of 1 of these lymph nodes  recommended. 3. Anterior LEFT breast skin thickening nonspecific but may reflect dermal involvement. 4. No mammographic evidence of RIGHT breast malignancy.   07/31/2020 Initial Biopsy   Diagnosis 1. Breast, left, needle core biopsy, 1 :30 pm, 5cmfn - INVASIVE DUCTAL CARCINOMA, SEE COMMENT. 2. Breast, left, needle core biopsy, 2 o'clock, 8cmfn - INVASIVE DUCTAL CARCINOMA, SEE COMMENT. - DUCTAL CARCINOMA IN SITU. 3. Lymph node, needle/core biopsy, left axillary - METASTATIC CARCINOMA IN A LYMPH NODE. Microscopic Comment 1. and 3. The carcinoma in both parts has a similar morphology and appears grade 2-3. The DCIS in part 2 is high-grade with necrosis. The carcinoma measures 15 mm (part 1) and 14 mm (part 2) in greatest linear extent  2. PROGNOSTIC INDICATORS Results: IMMUNOHISTOCHEMICAL AND MORPHOMETRIC ANALYSIS PERFORMED MANUALLY The tumor cells are POSITIVE for Her2 (3+). Estrogen Receptor: 0%, NEGATIVE Progesterone Receptor: 0%, NEGATIVE Proliferation Marker Ki67: 20%    07/31/2020 Cancer Staging   Staging form: Breast, AJCC 8th Edition - Clinical stage from 07/31/2020: Stage IV (cT2, cN3, cM1, G2, ER-, PR-, HER2+) - Signed by Pollyann Samples, NP on 08/31/2020 Stage prefix: Initial diagnosis Nuclear grade: G2 Histologic grading system: 3 grade system   08/13/2020 Initial Diagnosis   Malignant neoplasm of upper-outer quadrant of female breast (HCC)   08/28/2020 PET scan   IMPRESSION: Signs of LEFT breast cancer with LEFT axillary and retropectoral adenopathy as well as LEFT supraclavicular adenopathy.   Signs of pulmonary metastatic disease.   Mild asymmetric uptake in the LEFT as compared to the RIGHT adrenal gland. Equivocal but suspicious, attention on follow-up.   Aortic atherosclerosis.   Cystic changes and potential bronchiectasis in the RIGHT lung base likely post infectious or inflammatory. Correlate with any respiratory symptoms. Comparison with prior imaging may be  helpful with attention on follow-up.   08/28/2020 Imaging   BRAIN MRI IMPRESSION: 1. Subacute lacunar infarct of the left basal ganglia with no malignant hemorrhagic transformation or mass effect. Underlying chronic bilateral basal ganglia ischemia. 2. No metastatic disease or other acute intracranial abnormality identified.   08/28/2020 Imaging   Breast MRI   IMPRESSION: 1. 8.1 x 8.1 x 5.1 cm area of biopsy-proven malignancy in the central, upper outer and lower outer quadrants of the left breast. 2. Diffuse anterior left breast skin thickening without abnormal enhancement. This may represent thickening due to lymphedema associated with lymphatic obstruction. 3. Metastatic level 1 and level 2 left axillary adenopathy. 4. No evidence of malignancy on the right.   09/11/2020 - 11/03/2021 Chemotherapy   Patient is on Treatment Plan : BREAST ADO-Trastuzumab Emtansine (Kadcyla) q21d     09/11/2020 -  Chemotherapy   Patient is on Treatment Plan : BREAST ADO-Trastuzumab Emtansine (Kadcyla) q21d     11/30/2020 Imaging   CT Chest w/o contrast  IMPRESSION: 1. Interval response to therapy. The left breast mass has mildly decreased in size in the interval. 2. Decrease in size of left axillary and left retropectoral lymph nodes. 3. Small right lung nodules stable to improved. 4. Coronary artery calcifications noted. 5. Aortic Atherosclerosis (ICD10-I70.0).   03/04/2021 PET scan   IMPRESSION: 1. Overall marked improvement compared to the prior PET-CT, with  resolution of hypermetabolic activity and pathologic enlargement of the previously involved lymph nodes; lack of hypermetabolic activity and nonvisualization of prior right-sided lung nodules, and substantial reduction in size and activity in the left breast primary. 2. New ground-glass opacity medially in the right lower lobe with low-grade metabolic activity, highly likely to be inflammatory (alveolitis) given the morphology and  appearance. 3. Other imaging findings of potential clinical significance: Aortic Atherosclerosis (ICD10-I70.0). Coronary atherosclerosis. Mitral valve calcifications. Punctate nonobstructive left renal calculi. Lower lumbar impingement due to chronic spondylosis and degenerative disc disease. Remote left basal ganglia infarct.   06/07/2021 Imaging   EXAM: CT CHEST, ABDOMEN AND PELVIS WITHOUT CONTRAST  IMPRESSION: 1. Scattered tiny bilateral pulmonary nodules are similar to the prior exams, favored to be benign. 2. Otherwise, no evidence of metastatic disease within the chest, abdomen, or pelvis. 3. Right lower lobe ground-glass nodule has resolved since the prior PET 4.  Possible constipation. 5. Coronary artery atherosclerosis. Aortic Atherosclerosis (ICD10-I70.0). Emphysema (ICD10-J43.9). 6. Left nephrolithiasis.   06/15/2022 Imaging    IMPRESSION: 1. Mild increase in size of bilateral pulmonary nodules compatible with progression of metastatic disease. 2. No evidence for metastatic disease within the abdomen or pelvis. 3. Large volume of desiccated stool identified within the rectum. Correlate for any clinical signs or symptoms of constipation. 4. Nonobstructing left renal calculus. 5. Aortic Atherosclerosis (ICD10-I70.0).        INTERVAL HISTORY:  Christy [name] is here for a follow up of  metastatic breast cancer. She was last seen by Rutha Bouchard on 06/01/2022.She presents to the clinic alone. Pt state that she had a visit with Dr. Barbaraann Cao. Pt state the visit went well. Pt state that sometimes she had some cough and have a hard time swallowing.     All other systems were reviewed with the patient and are negative.  MEDICAL HISTORY:  Past Medical History:  Diagnosis Date   Diabetes mellitus without complication    Hyperlipidemia    Hypertension     SURGICAL HISTORY: Past Surgical History:  Procedure Laterality Date   FOOT FRACTURE SURGERY Right    IR  IMAGING GUIDED PORT INSERTION  09/22/2020   IR RADIOLOGIST EVAL & MGMT  09/25/2020    I have reviewed the social history and family history with the patient and they are unchanged from previous note.  ALLERGIES:  has No Known Allergies.  MEDICATIONS:  Current Outpatient Medications  Medication Sig Dispense Refill   tucatinib (TUKYSA) 150 MG tablet Take 1 tablet (150 mg total) by mouth 2 (two) times daily. 60 tablet 0   aspirin EC 325 MG tablet Take 1 tablet (325 mg total) by mouth daily. 60 tablet 3   atorvastatin (LIPITOR) 20 MG tablet Take 1 tablet (20 mg total) by mouth daily. 60 tablet 3   ferrous sulfate 325 (65 FE) MG EC tablet Take 1 tablet (325 mg total) by mouth daily with breakfast. 30 tablet 3   lidocaine-prilocaine (EMLA) cream Apply 1 application topically as needed. 30 g 2   metFORMIN (GLUCOPHAGE) 500 MG tablet Take 1 tablet (500 mg total) by mouth 2 (two) times daily with a meal. 60 tablet 1   olmesartan (BENICAR) 40 MG tablet      No current facility-administered medications for this visit.   Facility-Administered Medications Ordered in Other Visits  Medication Dose Route Frequency Provider Last Rate Last Admin   ado-trastuzumab emtansine (KADCYLA) 200 mg in sodium chloride 0.9 % 250 mL chemo infusion  3 mg/kg (  Treatment Plan Recorded) Intravenous Once Malachy Mood, MD 520 mL/hr at 06/22/22 1006 200 mg at 06/22/22 1006   heparin lock flush 100 unit/mL  500 Units Intracatheter Once PRN Malachy Mood, MD       sodium chloride flush (NS) 0.9 % injection 10 mL  10 mL Intracatheter PRN Malachy Mood, MD        PHYSICAL EXAMINATION: ECOG PERFORMANCE STATUS: 2 - Symptomatic, <50% confined to bed  Vitals:   06/22/22 0840  BP: (!) 145/62  Pulse: 71  Resp: 15  Temp: 98.2 F (36.8 C)  SpO2: 100%   Wt Readings from Last 3 Encounters:  06/22/22 152 lb (68.9 kg)  06/21/22 155 lb (70.3 kg)  05/11/22 154 lb 9.6 oz (70.1 kg)     GENERAL:alert, no distress and comfortable SKIN: skin  color normal, no rashes or significant lesions EYES: normal, Conjunctiva are pink and non-injected, sclera clear  NEURO: alert & oriented x 3 with fluent speech LABORATORY DATA:  I have reviewed the data as listed    Latest Ref Rng & Units 06/22/2022    8:16 AM 06/01/2022    9:36 AM 05/11/2022    8:04 AM  CBC  WBC 4.0 - 10.5 K/uL 4.8  4.4  5.2   Hemoglobin 12.0 - 15.0 g/dL 99.2  9.2  9.2   Hematocrit 36.0 - 46.0 % 29.5  28.0  27.3   Platelets 150 - 400 K/uL 143  130  145         Latest Ref Rng & Units 06/22/2022    8:16 AM 06/01/2022    9:36 AM 05/11/2022    8:04 AM  CMP  Glucose 70 - 99 mg/dL 426  94  834   BUN 8 - 23 mg/dL 17  18  21    Creatinine 0.44 - 1.00 mg/dL 1.96  2.22  9.79   Sodium 135 - 145 mmol/L 143  142  142   Potassium 3.5 - 5.1 mmol/L 3.7  3.5  3.5   Chloride 98 - 111 mmol/L 112  112  111   CO2 22 - 32 mmol/L 24  26  24    Calcium 8.9 - 10.3 mg/dL 89.2  11.9  41.7   Total Protein 6.5 - 8.1 g/dL 6.9  6.8  7.0   Total Bilirubin 0.3 - 1.2 mg/dL 0.8  0.8  0.7   Alkaline Phos 38 - 126 U/L 98  78  95   AST 15 - 41 U/L 51  61  51   ALT 0 - 44 U/L 25  24  26        RADIOGRAPHIC STUDIES: I have personally reviewed the radiological images as listed and agreed with the findings in the report. No results found.    No orders of the defined types were placed in this encounter.  All questions were answered. The patient knows to call the clinic with any problems, questions or concerns. No barriers to learning was detected. The total time spent in the appointment was 40 minutes.     Malachy Mood, MD 06/22/2022   Carolin Coy, CMA, am acting as scribe for Malachy Mood, MD.   I have reviewed the above documentation for accuracy and completeness, and I agree with the above.

## 2022-06-21 NOTE — Progress Notes (Signed)
[facility] at [facility] [address]  [place], [place] [address] [phone]   New Patient Evaluation  Date of Service: 06/21/22 Patient Name: Christy [name] Patient MRN: [id number] Patient DOB: Jan 21, 1941 Provider: Henreitta Leber, MD  Identifying Statement:  Christy [name] is a 82 y.o. female with Cerebrovascular accident (CVA), unspecified mechanism  Multiple falls who presents for initial consultation and evaluation regarding cancer associated neurologic deficits.    Referring Provider: [name], MD [address] [place],  [place] [address]  Primary Cancer:  Oncologic History: Oncology History Overview Note  Cancer Staging Malignant neoplasm of upper-outer quadrant of female breast [facility]) Staging form: Breast, AJCC 8th Edition - Clinical stage from 07/31/2020: Stage IV (cT2, cN3, cM1, G2, ER-, PR-, HER2+) - Signed by Pollyann Samples, NP on 08/31/2020 Stage prefix: Initial diagnosis Nuclear grade: G2 Histologic grading system: 3 grade system    Malignant neoplasm of upper-outer quadrant of female breast  07/24/2020 Breast US   On physical exam, a very firm mass is identified at the 1:30 position of the LEFT breast 5 cm from the nipple.   IMPRESSION: 1. Highly suspicious 4.3 cm mass at the 1:30 position of the LEFT breast and highly suspicious 2.7 cm mass at the 2 o'clock position of the LEFT breast, with the 2 masses encompassing an area measuring at least 6.1 cm. 2. 3 abnormal LEFT axillary lymph nodes with cortical thickening, tissue sampling of 1 of these lymph nodes recommended. 3. Anterior LEFT breast skin thickening nonspecific but may reflect dermal involvement. 4. No mammographic evidence of RIGHT breast malignancy.   07/31/2020 Initial Biopsy   Diagnosis 1. Breast, left, needle core biopsy, 1 :30 pm, 5cmfn - INVASIVE DUCTAL CARCINOMA, SEE COMMENT. 2. Breast, left, needle core biopsy, 2 o'clock, 8cmfn - INVASIVE  DUCTAL CARCINOMA, SEE COMMENT. - DUCTAL CARCINOMA IN SITU. 3. Lymph node, needle/core biopsy, left axillary - METASTATIC CARCINOMA IN A LYMPH NODE. Microscopic Comment 1. and 3. The carcinoma in both parts has a similar morphology and appears grade 2-3. The DCIS in part 2 is high-grade with necrosis. The carcinoma measures 15 mm (part 1) and 14 mm (part 2) in greatest linear extent  2. PROGNOSTIC INDICATORS Results: IMMUNOHISTOCHEMICAL AND MORPHOMETRIC ANALYSIS PERFORMED MANUALLY The tumor cells are POSITIVE for Her2 (3+). Estrogen Receptor: 0%, NEGATIVE Progesterone Receptor: 0%, NEGATIVE Proliferation Marker Ki67: 20%    07/31/2020 Cancer Staging   Staging form: Breast, AJCC 8th Edition - Clinical stage from 07/31/2020: Stage IV (cT2, cN3, cM1, G2, ER-, PR-, HER2+) - Signed by Pollyann Samples, NP on 08/31/2020 Stage prefix: Initial diagnosis Nuclear grade: G2 Histologic grading system: 3 grade system   08/13/2020 Initial Diagnosis   Malignant neoplasm of upper-outer quadrant of female breast (HCC)   08/28/2020 PET scan   IMPRESSION: Signs of LEFT breast cancer with LEFT axillary and retropectoral adenopathy as well as LEFT supraclavicular adenopathy.   Signs of pulmonary metastatic disease.   Mild asymmetric uptake in the LEFT as compared to the RIGHT adrenal gland. Equivocal but suspicious, attention on follow-up.   Aortic atherosclerosis.   Cystic changes and potential bronchiectasis in the RIGHT lung base likely post infectious or inflammatory. Correlate with any respiratory symptoms. Comparison with prior imaging may be helpful with attention on follow-up.   08/28/2020 Imaging   BRAIN MRI IMPRESSION: 1. Subacute lacunar infarct of the left basal ganglia with no malignant hemorrhagic transformation or mass effect. Underlying chronic bilateral basal ganglia ischemia. 2.  No metastatic disease or other acute intracranial abnormality identified.   08/28/2020 Imaging   Breast  MRI   IMPRESSION: 1. 8.1 x 8.1 x 5.1 cm area of biopsy-proven malignancy in the central, upper outer and lower outer quadrants of the left breast. 2. Diffuse anterior left breast skin thickening without abnormal enhancement. This may represent thickening due to lymphedema associated with lymphatic obstruction. 3. Metastatic level 1 and level 2 left axillary adenopathy. 4. No evidence of malignancy on the right.   09/11/2020 - 11/03/2021 Chemotherapy   Patient is on Treatment Plan : BREAST ADO-Trastuzumab Emtansine (Kadcyla) q21d     09/11/2020 -  Chemotherapy   Patient is on Treatment Plan : BREAST ADO-Trastuzumab Emtansine (Kadcyla) q21d     11/30/2020 Imaging   CT Chest w/o contrast  IMPRESSION: 1. Interval response to therapy. The left breast mass has mildly decreased in size in the interval. 2. Decrease in size of left axillary and left retropectoral lymph nodes. 3. Small right lung nodules stable to improved. 4. Coronary artery calcifications noted. 5. Aortic Atherosclerosis (ICD10-I70.0).   03/04/2021 PET scan   IMPRESSION: 1. Overall marked improvement compared to the prior PET-CT, with resolution of hypermetabolic activity and pathologic enlargement of the previously involved lymph nodes; lack of hypermetabolic activity and nonvisualization of prior right-sided lung nodules, and substantial reduction in size and activity in the left breast primary. 2. New ground-glass opacity medially in the right lower lobe with low-grade metabolic activity, highly likely to be inflammatory (alveolitis) given the morphology and appearance. 3. Other imaging findings of potential clinical significance: Aortic Atherosclerosis (ICD10-I70.0). Coronary atherosclerosis. Mitral valve calcifications. Punctate nonobstructive left renal calculi. Lower lumbar impingement due to chronic spondylosis and degenerative disc disease. Remote left basal ganglia infarct.   06/07/2021 Imaging   EXAM: CT CHEST,  ABDOMEN AND PELVIS WITHOUT CONTRAST  IMPRESSION: 1. Scattered tiny bilateral pulmonary nodules are similar to the prior exams, favored to be benign. 2. Otherwise, no evidence of metastatic disease within the chest, abdomen, or pelvis. 3. Right lower lobe ground-glass nodule has resolved since the prior PET 4.  Possible constipation. 5. Coronary artery atherosclerosis. Aortic Atherosclerosis (ICD10-I70.0). Emphysema (ICD10-J43.9). 6. Left nephrolithiasis.   06/15/2022 Imaging    IMPRESSION: 1. Mild increase in size of bilateral pulmonary nodules compatible with progression of metastatic disease. 2. No evidence for metastatic disease within the abdomen or pelvis. 3. Large volume of desiccated stool identified within the rectum. Correlate for any clinical signs or symptoms of constipation. 4. Nonobstructing left renal calculus. 5. Aortic Atherosclerosis (ICD10-I70.0).       History of Present Illness: The patient's records from the referring physician were obtained and reviewed and the patient interviewed to confirm this HPI.  AILED DEFIBAUGH presents for evaluation today after recent falls.  She describes several episodes of "right leg and right side giving out" leading to falls.  This typically is followed by return or gradual return back to baseline strength.  No issues with left side, no frank seizure episodes.  Denies headaches.  She is not taking Aspirin for stroke prevention, nor the statin.  Continues on metformin for diabetes control.  Sees Dr. Mosetta Putt for Kadcyla for breast cancer.  H+P (09/21/20) Patient presented to neurologic attention several weeks ago, with complaint of right leg weakness and right hand clumsiness.  She describes acute decline in right leg function, on top of chronic orthopedic and pain associated limitations of both legs.  She was baseline ambulatory with a walker only prior to  this change, and continues to be, although overall activity level is minimal.  She  also describes change in right hand function, with noted difficulty in producing clean handwriting and buttoning small shirt buttons.  Otherwise no other neurologic complaints.  Brain MRI demonstrate a stroke, which prompted eval today.  She has been dosing aspirin 81mg  daily per Dr. Mosetta Putt instructions.    Medications: Current Outpatient Medications on File Prior to Visit  Medication Sig Dispense Refill   atorvastatin (LIPITOR) 20 MG tablet Take 1 tablet (20 mg total) by mouth daily. 60 tablet 3   ferrous sulfate 325 (65 FE) MG EC tablet Take 1 tablet (325 mg total) by mouth daily with breakfast. 30 tablet 3   lidocaine-prilocaine (EMLA) cream Apply 1 application topically as needed. 30 g 2   metFORMIN (GLUCOPHAGE) 500 MG tablet Take 1 tablet (500 mg total) by mouth 2 (two) times daily with a meal. 60 tablet 1   olmesartan (BENICAR) 40 MG tablet      No current facility-administered medications on file prior to visit.    Allergies: No Known Allergies Past Medical History:  Past Medical History:  Diagnosis Date   Diabetes mellitus without complication (HCC)    Hyperlipidemia    Hypertension    Past Surgical History:  Past Surgical History:  Procedure Laterality Date   FOOT FRACTURE SURGERY Right    IR IMAGING GUIDED PORT INSERTION  09/22/2020   IR RADIOLOGIST EVAL & MGMT  09/25/2020   Social History:  Social History   Socioeconomic History   Marital status: Single    Spouse name: Not on file   Number of children: Not on file   Years of education: Not on file   Highest education level: Not on file  Occupational History   Not on file  Tobacco Use   Smoking status: Former   Smokeless tobacco: Never  Vaping Use   Vaping Use: Never used  Substance and Sexual Activity   Alcohol use: Not Currently   Drug use: Never   Sexual activity: Not on file  Other Topics Concern   Not on file  Social History Narrative   Not on file   Social Determinants of Health   Financial Resource  Strain: Not on file  Food Insecurity: No Food Insecurity (06/21/2022)   Hunger Vital Sign    Worried About Running Out of Food in the Last Year: Never true    Ran Out of Food in the Last Year: Never true  Transportation Needs: Unmet Transportation Needs (06/21/2022)   PRAPARE - Administrator, Civil Service (Medical): Yes    Lack of Transportation (Non-Medical): Yes  Physical Activity: Not on file  Stress: Not on file  Social Connections: Not on file  Intimate Partner Violence: Not At Risk (06/21/2022)   Humiliation, Afraid, Rape, and Kick questionnaire    Fear of Current or Ex-Partner: No    Emotionally Abused: No    Physically Abused: No    Sexually Abused: No   Family History:  Family History  Problem Relation Age of Onset   Cancer Sister 71       Breast   Cancer Niece        Possibly breast    Review of Systems: Constitutional: Doesn't report fevers, chills or abnormal weight loss Eyes: Doesn't report blurriness of vision Ears, nose, mouth, throat, and face: Doesn't report sore throat Respiratory: Doesn't report cough, dyspnea or wheezes Cardiovascular: Doesn't report palpitation, chest discomfort  Gastrointestinal:  Doesn't report nausea, constipation, diarrhea GU: Doesn't report incontinence Skin: Doesn't report skin rashes Neurological: Per HPI Musculoskeletal: Doesn't report joint pain Behavioral/Psych: Doesn't report anxiety  Physical Exam: Vitals:   06/21/22 1012  BP: (!) 155/67  Pulse: 77  Resp: 18  Temp: 97.9 F (36.6 C)  SpO2: 98%   KPS: 70. General: Alert, cooperative, pleasant, in no acute distress Head: Normal EENT: No conjunctival injection or scleral icterus.  Lungs: Resp effort normal Cardiac: Regular rate Abdomen: Non-distended abdomen Skin: No rashes cyanosis or petechiae. Extremities: No clubbing or edema  Neurologic Exam: Mental Status: Awake, alert, attentive to examiner. Oriented to self and environment. Language is fluent  with intact comprehension.  Cranial Nerves: Visual acuity is grossly normal. Visual fields are full. Extra-ocular movements intact. No ptosis. Face is symmetric Motor: Tone and bulk are normal. 4/5 in right leg, pain limited.  Slight drift in right arm only. Reflexes are symmetric, no pathologic reflexes present.  Sensory: Intact to light touch Gait: Deferred   Labs: I have reviewed the data as listed    Component Value Date/Time   NA 142 06/01/2022 0936   K 3.5 06/01/2022 0936   CL 112 (H) 06/01/2022 0936   CO2 26 06/01/2022 0936   GLUCOSE 94 06/01/2022 0936   BUN 18 06/01/2022 0936   CREATININE 1.71 (H) 06/01/2022 0936   CALCIUM 10.1 06/01/2022 0936   PROT 6.8 06/01/2022 0936   ALBUMIN 3.4 (L) 06/01/2022 0936   AST 61 (H) 06/01/2022 0936   ALT 24 06/01/2022 0936   ALKPHOS 78 06/01/2022 0936   BILITOT 0.8 06/01/2022 0936   GFRNONAA 30 (L) 06/01/2022 0936   GFRAA 39 (L) 06/25/2019 1324   Lab Results  Component Value Date   WBC 4.4 06/01/2022   NEUTROABS 1.7 06/01/2022   HGB 9.2 (L) 06/01/2022   HCT 28.0 (L) 06/01/2022   MCV 73.5 (L) 06/01/2022   PLT 130 (L) 06/01/2022    Imaging:  CT CHEST ABDOMEN PELVIS WO CONTRAST  Result Date: 06/17/2022 CLINICAL DATA:  Stage IV invasive breast cancer.  Restaging. EXAM: CT CHEST, ABDOMEN AND PELVIS WITHOUT CONTRAST TECHNIQUE: Multidetector CT imaging of the chest, abdomen and pelvis was performed following the standard protocol without IV contrast. RADIATION DOSE REDUCTION: This exam was performed according to the departmental dose-optimization program which includes automated exposure control, adjustment of the mA and/or kV according to patient size and/or use of iterative reconstruction technique. COMPARISON:  PET-CT 01/28/2022. FINDINGS: CT CHEST FINDINGS Cardiovascular: Heart size appears within normal limits. No pericardial effusion. Aortic atherosclerosis and coronary artery calcifications. Mitral valve calcifications.  Mediastinum/Nodes: No enlarged mediastinal, hilar, or axillary lymph nodes. Thyroid gland, trachea, and esophagus demonstrate no significant findings. Lungs/Pleura: No pleural fluid. No airspace consolidation. Scarring and architectural distortion identified within the right lower lobe along with bronchiectasis. Pulmonary nodules are again noted. Reference nodules include: -within the lateral left upper lobe there is a 1 cm nodule, image 73/6. On the previous exam this measured 0.6 cm. -Anterolateral left lower lobe lung nodule measures 9 mm, image 106/6. Previously 6 mm. -Nodule in the periphery of the right lower lobe measures 5 mm, image 109/6. Previously 4 mm. -Within the right middle lobe there is a 6 mm nodule, image 97/6. Not confidently identified on the previous imaging. Musculoskeletal: No chest wall mass or suspicious bone lesions identified. CT ABDOMEN PELVIS FINDINGS Hepatobiliary: No suspicious liver abnormality identified. Pancreas: Unremarkable. No pancreatic ductal dilatation or surrounding inflammatory changes. Spleen: Normal in size without  focal abnormality. Adrenals/Urinary Tract: Normal adrenal glands. Upper pole left kidney cyst is again seen measuring 3 cm. No follow-up imaging recommended. Nonobstructing stone in the inferior pole of the left kidney measures 4 mm. Bladder is unremarkable. Stomach/Bowel: No pathologic dilatation of the large or small bowel loops. Moderate stool burden noted throughout the colon. Large volume of desiccated stool is identified within the rectum Vascular/Lymphatic: Aortic atherosclerosis. No signs of abdominopelvic adenopathy. Reproductive: Uterus and bilateral adnexa are unremarkable. Other: No abdominal wall hernia or abnormality. No abdominopelvic ascites. Musculoskeletal: No acute or significant osseous findings. Lumbar scoliosis and degenerative disc disease. IMPRESSION: 1. Mild increase in size of bilateral pulmonary nodules compatible with progression of  metastatic disease. 2. No evidence for metastatic disease within the abdomen or pelvis. 3. Large volume of desiccated stool identified within the rectum. Correlate for any clinical signs or symptoms of constipation. 4. Nonobstructing left renal calculus. 5. Aortic Atherosclerosis (ICD10-I70.0). Electronically Signed   By: Signa Kell M.D.   On: 06/17/2022 08:41   MR Brain W Wo Contrast  Result Date: 06/01/2022 CLINICAL DATA:  Breast cancer, multiple fall lately, rule out brian mets EXAM: MRI HEAD WITHOUT AND WITH CONTRAST TECHNIQUE: Multiplanar, multiecho pulse sequences of the brain and surrounding structures were obtained without and with intravenous contrast. CONTRAST:  17mL GADAVIST GADOBUTROL 1 MMOL/ML IV SOLN COMPARISON:  MRI August 28, 2020. FINDINGS: Brain: Prior hemorrhagic infarct in the left basal ganglia. Smaller remote right basal ganglia lacunar infarct. No evidence of acute infarct, acute hemorrhage, mass lesion, midline shift or hydrocephalus. No pathologic enhancement. Vascular: Major arterial flow voids are maintained at the skull base. Skull and upper cervical spine: Normal marrow signal. Sinuses/Orbits: Clear sinuses.  No acute orbital findings. Other: Small right mastoid effusion. IMPRESSION: 1. No evidence of metastatic disease or acute abnormality. 2. Prior bilateral basal ganglia lacunar infarcts. Electronically Signed   By: Feliberto Harts M.D.   On: 06/01/2022 10:08     Assessment/Plan Cerebrovascular accident (CVA), unspecified mechanism  Multiple falls  DIVINE MALOOF presents today with clinical and radiographic syndrome consistent with chronic bilateral internal capsule infarct, left worse than right.  Prior relevant workup includes brain MRI, EKG, echocardiogram, all reviewed.  Risk factors include age, hypertension, diabetes, cancer syndrome.    Episodes of right sided weakness are likely fluctuating lacunar syndrome or stereotypic TIAs.  She has been non-compliant  with Aspirin, statin appears to have been discontinued by another provider.  Aspririn was "self" Dc'd.  Recommended resuming full dose ASA 325mg  if tolerated.  DM and hypertension regimen can be further titrated by PCP if preferred.  We counseled her and her daughter extensively on secondary stroke prevention, risk factor modification.    She is not interested in rehab services at this time.  Will cont on Kadcyla monotherapy for now with Dr. Mosetta Putt.  We appreciate the opportunity to participate in the care of MALAYIA DEBARI. We ask that PATTYANN AILSTOCK return to clinic in 4 months with daughter for repeat evaluation, or sooner as needed.  The total time spent in the encounter was 40 minutes and more than 50% was on counseling and review of test results   Henreitta Leber, MD Medical Director of Neuro-Oncology [facility] at [facility] 06/21/22 11:09 AM

## 2022-06-21 NOTE — Assessment & Plan Note (Signed)
-  brain MRI 05/30/2022 showed prior bilateral basal ganglia lacunar infarcts  -I encouraged her to use walker -She previously had a physical therapy, and declined home PT for now.

## 2022-06-22 ENCOUNTER — Telehealth: Payer: Self-pay | Admitting: Pharmacy Technician

## 2022-06-22 ENCOUNTER — Telehealth: Payer: Self-pay | Admitting: Pharmacist

## 2022-06-22 ENCOUNTER — Other Ambulatory Visit (HOSPITAL_COMMUNITY): Payer: Self-pay

## 2022-06-22 ENCOUNTER — Other Ambulatory Visit: Payer: Self-pay

## 2022-06-22 ENCOUNTER — Inpatient Hospital Stay (HOSPITAL_BASED_OUTPATIENT_CLINIC_OR_DEPARTMENT_OTHER): Payer: Medicare HMO | Admitting: Hematology

## 2022-06-22 ENCOUNTER — Encounter: Payer: Self-pay | Admitting: Hematology

## 2022-06-22 ENCOUNTER — Inpatient Hospital Stay: Payer: Medicare HMO

## 2022-06-22 VITALS — BP 152/94 | HR 72 | Temp 97.6°F | Resp 16

## 2022-06-22 VITALS — BP 145/62 | HR 71 | Temp 98.2°F | Resp 15 | Ht 64.0 in | Wt 152.0 lb

## 2022-06-22 DIAGNOSIS — R296 Repeated falls: Secondary | ICD-10-CM | POA: Diagnosis not present

## 2022-06-22 DIAGNOSIS — Z171 Estrogen receptor negative status [ER-]: Secondary | ICD-10-CM

## 2022-06-22 DIAGNOSIS — Z95828 Presence of other vascular implants and grafts: Secondary | ICD-10-CM

## 2022-06-22 DIAGNOSIS — C50412 Malignant neoplasm of upper-outer quadrant of left female breast: Secondary | ICD-10-CM

## 2022-06-22 LAB — CBC WITH DIFFERENTIAL (CANCER CENTER ONLY)
Abs Immature Granulocytes: 0.01 10*3/uL (ref 0.00–0.07)
Basophils Absolute: 0.1 10*3/uL (ref 0.0–0.1)
Basophils Relative: 1 %
Eosinophils Absolute: 0.3 10*3/uL (ref 0.0–0.5)
Eosinophils Relative: 6 %
HCT: 29.5 % — ABNORMAL LOW (ref 36.0–46.0)
Hemoglobin: 10 g/dL — ABNORMAL LOW (ref 12.0–15.0)
Immature Granulocytes: 0 %
Lymphocytes Relative: 48 %
Lymphs Abs: 2.3 10*3/uL (ref 0.7–4.0)
MCH: 25 pg — ABNORMAL LOW (ref 26.0–34.0)
MCHC: 33.9 g/dL (ref 30.0–36.0)
MCV: 73.8 fL — ABNORMAL LOW (ref 80.0–100.0)
Monocytes Absolute: 0.3 10*3/uL (ref 0.1–1.0)
Monocytes Relative: 7 %
Neutro Abs: 1.8 10*3/uL (ref 1.7–7.7)
Neutrophils Relative %: 38 %
Platelet Count: 143 10*3/uL — ABNORMAL LOW (ref 150–400)
RBC: 4 MIL/uL (ref 3.87–5.11)
RDW: 17.6 % — ABNORMAL HIGH (ref 11.5–15.5)
WBC Count: 4.8 10*3/uL (ref 4.0–10.5)
nRBC: 0 % (ref 0.0–0.2)

## 2022-06-22 LAB — CMP (CANCER CENTER ONLY)
ALT: 25 U/L (ref 0–44)
AST: 51 U/L — ABNORMAL HIGH (ref 15–41)
Albumin: 3.5 g/dL (ref 3.5–5.0)
Alkaline Phosphatase: 98 U/L (ref 38–126)
Anion gap: 7 (ref 5–15)
BUN: 17 mg/dL (ref 8–23)
CO2: 24 mmol/L (ref 22–32)
Calcium: 10.6 mg/dL — ABNORMAL HIGH (ref 8.9–10.3)
Chloride: 112 mmol/L — ABNORMAL HIGH (ref 98–111)
Creatinine: 1.67 mg/dL — ABNORMAL HIGH (ref 0.44–1.00)
GFR, Estimated: 30 mL/min — ABNORMAL LOW (ref >60)
Glucose, Bld: 103 mg/dL — ABNORMAL HIGH (ref 70–99)
Potassium: 3.7 mmol/L (ref 3.5–5.1)
Sodium: 143 mmol/L (ref 135–145)
Total Bilirubin: 0.8 mg/dL (ref 0.3–1.2)
Total Protein: 6.9 g/dL (ref 6.5–8.1)

## 2022-06-22 MED ORDER — SODIUM CHLORIDE 0.9 % IV SOLN: Freq: Once | INTRAVENOUS | Status: AC

## 2022-06-22 MED ORDER — HEPARIN SOD (PORK) LOCK FLUSH 100 UNIT/ML IV SOLN
500.0000 [IU] | Freq: Once | INTRAVENOUS | Status: AC | PRN
  Administered 2022-06-22: 500 [IU]

## 2022-06-22 MED ORDER — ACETAMINOPHEN 325 MG PO TABS
650.0000 mg | ORAL_TABLET | Freq: Once | ORAL | Status: AC
  Administered 2022-06-22: 650 mg via ORAL
  Filled 2022-06-22: qty 2

## 2022-06-22 MED ORDER — SODIUM CHLORIDE 0.9% FLUSH
10.0000 mL | INTRAVENOUS | Status: DC | PRN
  Administered 2022-06-22: 10 mL

## 2022-06-22 MED ORDER — SODIUM CHLORIDE 0.9% FLUSH
10.0000 mL | Freq: Once | INTRAVENOUS | Status: AC
  Administered 2022-06-22: 10 mL

## 2022-06-22 MED ORDER — TUCATINIB 150 MG PO TABS
150.0000 mg | ORAL_TABLET | Freq: Two times a day (BID) | ORAL | 0 refills | Status: DC
  Filled 2022-06-22: qty 120, 60d supply, fill #0
  Filled 2022-06-28: qty 60, 30d supply, fill #0

## 2022-06-22 MED ORDER — SODIUM CHLORIDE 0.9 % IV SOLN
3.0000 mg/kg | Freq: Once | INTRAVENOUS | Status: AC
  Administered 2022-06-22: 200 mg via INTRAVENOUS
  Filled 2022-06-22: qty 10

## 2022-06-22 MED ORDER — DIPHENHYDRAMINE HCL 25 MG PO CAPS
50.0000 mg | ORAL_CAPSULE | Freq: Once | ORAL | Status: AC
  Administered 2022-06-22: 50 mg via ORAL
  Filled 2022-06-22: qty 2

## 2022-06-22 NOTE — Patient Instructions (Signed)
[facility]  Discharge Instructions: Thank you for choosing [facility] to provide your oncology and hematology care.   If you have a lab appointment with the Cancer Center, please go directly to the Cancer Center and check in at the registration area.   Wear comfortable clothing and clothing appropriate for easy access to any Portacath or PICC line.   We strive to give you quality time with your provider. You may need to reschedule your appointment if you arrive late (15 or more minutes).  Arriving late affects you and other patients whose appointments are after yours.  Also, if you miss three or more appointments without notifying the office, you may be dismissed from the clinic at the provider's discretion.      For prescription refill requests, have your pharmacy contact our office and allow 72 hours for refills to be completed.    Today you received the following chemotherapy and/or immunotherapy agents: Kadcyla.       To help prevent nausea and vomiting after your treatment, we encourage you to take your nausea medication as directed.  BELOW ARE SYMPTOMS THAT SHOULD BE REPORTED IMMEDIATELY: *FEVER GREATER THAN 100.4 F (38 C) OR HIGHER *CHILLS OR SWEATING *NAUSEA AND VOMITING THAT IS NOT CONTROLLED WITH YOUR NAUSEA MEDICATION *UNUSUAL SHORTNESS OF BREATH *UNUSUAL BRUISING OR BLEEDING *URINARY PROBLEMS (pain or burning when urinating, or frequent urination) *BOWEL PROBLEMS (unusual diarrhea, constipation, pain near the anus) TENDERNESS IN MOUTH AND THROAT WITH OR WITHOUT PRESENCE OF ULCERS (sore throat, sores in mouth, or a toothache) UNUSUAL RASH, SWELLING OR PAIN  UNUSUAL VAGINAL DISCHARGE OR ITCHING   Items with * indicate a potential emergency and should be followed up as soon as possible or go to the Emergency Department if any problems should occur.  Please show the CHEMOTHERAPY ALERT CARD or IMMUNOTHERAPY ALERT CARD at  check-in to the Emergency Department and triage nurse.  Should you have questions after your visit or need to cancel or reschedule your appointment, please contact [facility]  Dept: [phone]  and follow the prompts.  Office hours are 8:00 a.m. to 4:30 p.m. Monday - Friday. Please note that voicemails left after 4:00 p.m. may not be returned until the following business day.  We are closed weekends and major holidays. You have access to a nurse at all times for urgent questions. Please call the main number to the clinic Dept: [phone] and follow the prompts.   For any non-urgent questions, you may also contact your provider using MyChart. We now offer e-Visits for anyone 18 and older to request care online for non-urgent symptoms. For details visit [email or web address].   Also download the MyChart app! Go to the app store, search "MyChart", open the app, select [facility], and log in with your MyChart username and password.   

## 2022-06-22 NOTE — Telephone Encounter (Signed)
Oral Oncology Patient Advocate Encounter  After completing a benefits investigation, prior authorization for Christy [name] is not required at this time through [facility].  Patient's copay is $3,327.94.     Jinger Neighbors, CPhT-Adv Oncology Pharmacy Patient Advocate [facility] Direct Number: [phone]  Fax: [phone]

## 2022-06-22 NOTE — Telephone Encounter (Signed)
Oral Oncology Pharmacist Encounter  Received new prescription for Tukysa (tucatinib) for the treatment of metastatic HR negative, HER-2 positive breast cancer in conjunction with Kadcyla, planned duration until disease progression or unacceptable drug toxicity. Per MD patient not candidate for Xeloda as part of regimen (of note patient CrCl ~28 mL/min making Xeloda contraindicated).  CBC w/ Diff and CMP from 06/22/22 assessed, patient Scr of 1.67 mg/dL (CrCl ~16 mL/min) - while no specific dose adjustments are required for renal impairment with Tukysa, it will be initiated at lowest dose. Prescription dose and frequency assessed for appropriateness.  Current medication list in Epic reviewed, DDIs with [name] identified: Category C drug-drug interaction between [place] and Atorvastatin - Tukysa, a strong CYP3A4 inhibitor, may increase serum concentrations of atorvastatin, thus leading to increased side effects from atorvastatin, including but not limited to LFT changes, myalgias, etc. Recommend monitoring patient for increased side effects from atorvastatin - no change in therapy required at this time.  Category C drug-drug interaction between [place] and Metformin - Tukysa, a MATE 1/2-K inhibitor may increase serum concentrations of metformin. Recommend monitoring for increased side effects from metformin (hypoglycemia), no changes in therapy required at this time.   Evaluated chart and no patient barriers to medication adherence noted.   Patient agreement for treatment documented in MD note on 06/22/22.  Due to high copay and no current grants open for patient's disease state, Oral Chemotherapy Clinic will proceed with attempting to obtain medication through patient assistance at this time.   Oral Oncology Clinic will continue to follow for insurance authorization, copayment issues, initial counseling and start date.  Lenord Carbo, PharmD, BCPS, [facility] Hematology/Oncology Clinical Pharmacist [facility] and [facility] Oral Chemotherapy Navigation Clinics [phone] 06/22/2022 10:44 AM

## 2022-06-23 ENCOUNTER — Telehealth: Payer: Self-pay | Admitting: Pharmacy Technician

## 2022-06-23 NOTE — Telephone Encounter (Signed)
Oral Oncology Patient Advocate Encounter   Began application for assistance for Tukysa through [employer].   Application will be submitted upon completion of necessary supporting documentation.   [employer] phone number [phone].   I will continue to check the status until final determination.   Jinger Neighbors, CPhT-Adv Oncology Pharmacy Patient Advocate [facility] Direct Number: [phone]  Fax: [phone]

## 2022-06-24 ENCOUNTER — Telehealth: Payer: Self-pay | Admitting: Pharmacy Technician

## 2022-06-24 ENCOUNTER — Other Ambulatory Visit: Payer: Self-pay

## 2022-06-24 NOTE — Telephone Encounter (Signed)
Oral Oncology Patient Advocate Encounter  [employer] funding opened with PAF. Application for PAP will be placed on hold until further notice.  Jinger Neighbors, CPhT-Adv Oncology Pharmacy Patient Advocate [facility] Direct Number: [phone]  Fax: [phone]

## 2022-06-24 NOTE — Telephone Encounter (Signed)
Oral Oncology Patient Advocate Encounter   Was successful in securing patient a $11,500 grant from [employer] (PA[employer]) to provide copayment coverage for Tukysa.  This will keep the out of pocket expense at $0.    The billing information is as follows and has been shared with [facility].   RxBin: [id number] PCN:  [id number] Member ID: [id number] Group ID: [id number] Dates of Eligibility: 06/24/22 through 06/24/23  Jinger Neighbors, CPhT-Adv Oncology Pharmacy Patient Advocate [facility] Direct Number: [phone]  Fax: [phone]

## 2022-06-27 ENCOUNTER — Other Ambulatory Visit (HOSPITAL_COMMUNITY): Payer: Self-pay

## 2022-06-27 ENCOUNTER — Encounter: Payer: Self-pay | Admitting: Hematology

## 2022-06-27 NOTE — Telephone Encounter (Signed)
Oral Chemotherapy Pharmacist Encounter   Attempted to reach patient to provide update and offer for initial counseling on oral medication: Tukysa (tucatinib).   No answer. Left voicemail for patient to call back to discuss details of medication acquisition and initial counseling session.  Lenord Carbo, PharmD, BCPS, BCOP Hematology/Oncology Clinical Pharmacist [facility] [address] Oral Chemotherapy Navigation Clinics [phone] 06/27/2022 1:12 PM

## 2022-06-28 ENCOUNTER — Other Ambulatory Visit (HOSPITAL_COMMUNITY): Payer: Self-pay

## 2022-06-28 NOTE — Telephone Encounter (Signed)
Oral Chemotherapy Pharmacist Encounter  I spoke with patient for overview of new medication Tukysa (tucatinib) for the treatment of metastatic HR negative, HER-2 positive breast cancer in conjunction with Kadcyla, planned duration until disease progression or unacceptable drug toxicity. Per MD patient not candidate for Xeloda.  Counseled patient on administration, dosing, side effects, monitoring, drug-food interactions, safe handling, storage, and disposal.  Patient will take Tukysa  tablets, 1 tablet ( ) by mouth twice daily, without regard to food, taken continuously.  [name] start date: 07/01/22  Adverse effects of Tukysa include but are not limited to: palmar-plantar erythrodysesthesia, hepatotoxicity, nausea, vomiting, mouth sores, decreased appetite, diarrhea, and electrolyte abnormalities.  Patient will obtain anti diarrheal and alert the office of 4 or more loose stools above baseline.   Reviewed with patient importance of keeping a medication schedule and plan for any missed doses. No barriers to medication adherence identified.  Medication reconciliation performed and medication/allergy list updated.  All questions answered.  [name] voiced understanding and appreciation.   Medication education handout placed in mail for patient. Patient knows to call the office with questions or concerns. Oral Chemotherapy Clinic phone number provided to patient.   Lenord Carbo, PharmD, BCPS, [facility] Hematology/Oncology Clinical Pharmacist [facility] Oral Chemotherapy Navigation Clinics [phone] 06/28/2022 3:59 PM

## 2022-06-28 NOTE — Telephone Encounter (Signed)
Oral Chemotherapy Pharmacist Encounter   2nd attempt made to reach patient as well as patient's daughter to provide update and offer for initial counseling on oral medication: Tukysa (tucatinib).   No answer on either phone number in patient's chart. Left voicemail for patient to call back to discuss details of medication acquisition and initial counseling session.  Lenord Carbo, PharmD, BCPS, [facility] Hematology/Oncology Clinical Pharmacist [facility] Oral Chemotherapy Navigation Clinics [phone] 06/28/2022 9:36 AM

## 2022-06-29 ENCOUNTER — Other Ambulatory Visit (HOSPITAL_COMMUNITY): Payer: Self-pay

## 2022-06-29 ENCOUNTER — Telehealth: Payer: Self-pay | Admitting: Hematology

## 2022-06-29 NOTE — Telephone Encounter (Signed)
Patient left voicemail to verify time and date of appointment.

## 2022-07-04 ENCOUNTER — Telehealth: Payer: Self-pay

## 2022-07-04 NOTE — Telephone Encounter (Signed)
Pt's daughter called stating that the Tukysa that Dr. Mosetta Putt prescribed for the pt has been making the pt very sleepy and tired.  Pt's daughter stated she would like to speak with Dr. Mosetta Putt regarding this medication and to see if the pt can be taken off of the [place].  Pt's daughter stated the pt was not like this prior to starting this new chemo.  Pt's daughter would like for Dr. Mosetta Putt to give her a call today if possible at [phone] or [phone].  Notified Dr. Mosetta Putt of the pt's daughter's call.

## 2022-07-05 ENCOUNTER — Other Ambulatory Visit: Payer: Self-pay

## 2022-07-05 NOTE — Progress Notes (Signed)
Per Patient Calls conversation sent by this RN to Dr. Mosetta Putt regarding pt's oral chemo medication:  View All Conversations on this Encounter  Malachy Mood, MD  You; Lenox Ponds, CPhT16 hours ago (4:33 PM)    I called pt's daughter back, and I will stop Tucatinib due to side effects.  I briefly discussed the option of Enhertu with her, and will discuss more on her next visit to see if she is willing to switch.  Malachy Mood 07/04/2022

## 2022-07-05 NOTE — Progress Notes (Deleted)
[facility]   Telephone:([phone] Fax:(336) [phone]   Clinic Follow up Note   Patient Care Team: [name], MD as PCP - General (Internal Medicine) Malachy Mood, MD as Consulting Physician (Medical Oncology)  Date of Service:  07/05/2022  CHIEF COMPLAINT: f/u of metastatic breast cancer       CURRENT THERAPY:   Kadcyla every 3 weeks, started on 09/11/20   Tucatinib -pending  ASSESSMENT: *** Christy [name] is a 82 y.o. female with   No problem-specific Assessment & Plan notes found for this encounter.  ***   PLAN:     SUMMARY OF ONCOLOGIC HISTORY: Oncology History Overview Note  Cancer Staging Malignant neoplasm of upper-outer quadrant of female breast (HCC) Staging form: Breast, AJCC 8th Edition - Clinical stage from 07/31/2020: Stage IV (cT2, cN3, cM1, G2, ER-, PR-, HER2+) - Signed by Pollyann Samples, NP on 08/31/2020 Stage prefix: Initial diagnosis Nuclear grade: G2 Histologic grading system: 3 grade system    Malignant neoplasm of upper-outer quadrant of female breast (HCC)  07/24/2020 Breast US   On physical exam, a very firm mass is identified at the 1:30 position of the LEFT breast 5 cm from the nipple.   IMPRESSION: 1. Highly suspicious 4.3 cm mass at the 1:30 position of the LEFT breast and highly suspicious 2.7 cm mass at the 2 o'clock position of the LEFT breast, with the 2 masses encompassing an area measuring at least 6.1 cm. 2. 3 abnormal LEFT axillary lymph nodes with cortical thickening, tissue sampling of 1 of these lymph nodes recommended. 3. Anterior LEFT breast skin thickening nonspecific but may reflect dermal involvement. 4. No mammographic evidence of RIGHT breast malignancy.   07/31/2020 Initial Biopsy   Diagnosis 1. Breast, left, needle core biopsy, 1 :30 pm, 5cmfn - INVASIVE DUCTAL CARCINOMA, SEE COMMENT. 2. Breast, left, needle core biopsy, 2 o'clock, 8cmfn - INVASIVE DUCTAL CARCINOMA, SEE COMMENT. - DUCTAL  CARCINOMA IN SITU. 3. Lymph node, needle/core biopsy, left axillary - METASTATIC CARCINOMA IN A LYMPH NODE. Microscopic Comment 1. and 3. The carcinoma in both parts has a similar morphology and appears grade 2-3. The DCIS in part 2 is high-grade with necrosis. The carcinoma measures 15 mm (part 1) and 14 mm (part 2) in greatest linear extent  2. PROGNOSTIC INDICATORS Results: IMMUNOHISTOCHEMICAL AND MORPHOMETRIC ANALYSIS PERFORMED MANUALLY The tumor cells are POSITIVE for Her2 (3+). Estrogen Receptor: 0%, NEGATIVE Progesterone Receptor: 0%, NEGATIVE Proliferation Marker Ki67: 20%    07/31/2020 Cancer Staging   Staging form: Breast, AJCC 8th Edition - Clinical stage from 07/31/2020: Stage IV (cT2, cN3, cM1, G2, ER-, PR-, HER2+) - Signed by Pollyann Samples, NP on 08/31/2020 Stage prefix: Initial diagnosis Nuclear grade: G2 Histologic grading system: 3 grade system   08/13/2020 Initial Diagnosis   Malignant neoplasm of upper-outer quadrant of female breast (HCC)   08/28/2020 PET scan   IMPRESSION: Signs of LEFT breast cancer with LEFT axillary and retropectoral adenopathy as well as LEFT supraclavicular adenopathy.   Signs of pulmonary metastatic disease.   Mild asymmetric uptake in the LEFT as compared to the RIGHT adrenal gland. Equivocal but suspicious, attention on follow-up.   Aortic atherosclerosis.   Cystic changes and potential bronchiectasis in the RIGHT lung base likely post infectious or inflammatory. Correlate with any respiratory symptoms. Comparison with prior imaging may be helpful with attention on follow-up.   08/28/2020 Imaging   BRAIN MRI IMPRESSION: 1. Subacute lacunar infarct of the left basal ganglia with no  malignant hemorrhagic transformation or mass effect. Underlying chronic bilateral basal ganglia ischemia. 2. No metastatic disease or other acute intracranial abnormality identified.   08/28/2020 Imaging   Breast MRI   IMPRESSION: 1. 8.1 x 8.1 x 5.1  cm area of biopsy-proven malignancy in the central, upper outer and lower outer quadrants of the left breast. 2. Diffuse anterior left breast skin thickening without abnormal enhancement. This may represent thickening due to lymphedema associated with lymphatic obstruction. 3. Metastatic level 1 and level 2 left axillary adenopathy. 4. No evidence of malignancy on the right.   09/11/2020 - 11/03/2021 Chemotherapy   Patient is on Treatment Plan : BREAST ADO-Trastuzumab Emtansine (Kadcyla) q21d     09/11/2020 -  Chemotherapy   Patient is on Treatment Plan : BREAST ADO-Trastuzumab Emtansine (Kadcyla) q21d     11/30/2020 Imaging   CT Chest w/o contrast  IMPRESSION: 1. Interval response to therapy. The left breast mass has mildly decreased in size in the interval. 2. Decrease in size of left axillary and left retropectoral lymph nodes. 3. Small right lung nodules stable to improved. 4. Coronary artery calcifications noted. 5. Aortic Atherosclerosis (ICD10-I70.0).   03/04/2021 PET scan   IMPRESSION: 1. Overall marked improvement compared to the prior PET-CT, with resolution of hypermetabolic activity and pathologic enlargement of the previously involved lymph nodes; lack of hypermetabolic activity and nonvisualization of prior right-sided lung nodules, and substantial reduction in size and activity in the left breast primary. 2. New ground-glass opacity medially in the right lower lobe with low-grade metabolic activity, highly likely to be inflammatory (alveolitis) given the morphology and appearance. 3. Other imaging findings of potential clinical significance: Aortic Atherosclerosis (ICD10-I70.0). Coronary atherosclerosis. Mitral valve calcifications. Punctate nonobstructive left renal calculi. Lower lumbar impingement due to chronic spondylosis and degenerative disc disease. Remote left basal ganglia infarct.   06/07/2021 Imaging   EXAM: CT CHEST, ABDOMEN AND PELVIS WITHOUT  CONTRAST  IMPRESSION: 1. Scattered tiny bilateral pulmonary nodules are similar to the prior exams, favored to be benign. 2. Otherwise, no evidence of metastatic disease within the chest, abdomen, or pelvis. 3. Right lower lobe ground-glass nodule has resolved since the prior PET 4.  Possible constipation. 5. Coronary artery atherosclerosis. Aortic Atherosclerosis (ICD10-I70.0). Emphysema (ICD10-J43.9). 6. Left nephrolithiasis.   06/15/2022 Imaging    IMPRESSION: 1. Mild increase in size of bilateral pulmonary nodules compatible with progression of metastatic disease. 2. No evidence for metastatic disease within the abdomen or pelvis. 3. Large volume of desiccated stool identified within the rectum. Correlate for any clinical signs or symptoms of constipation. 4. Nonobstructing left renal calculus. 5. Aortic Atherosclerosis (ICD10-I70.0).        INTERVAL HISTORY: *** VEYDA KAUFMAN is here for a follow up of metastatic breast cancer . She was last seen by me on 417/2024. She presents to the clinic    All other systems were reviewed with the patient and are negative.  MEDICAL HISTORY:  Past Medical History:  Diagnosis Date   Diabetes mellitus without complication (HCC)    Hyperlipidemia    Hypertension     SURGICAL HISTORY: Past Surgical History:  Procedure Laterality Date   FOOT FRACTURE SURGERY Right    IR IMAGING GUIDED PORT INSERTION  09/22/2020   IR RADIOLOGIST EVAL & MGMT  09/25/2020    I have reviewed the social history and family history with the patient and they are unchanged from previous note.  ALLERGIES:  has No Known Allergies.  MEDICATIONS:  Current Outpatient Medications  Medication  Sig Dispense Refill   aspirin EC 325 MG tablet Take 1 tablet (325 mg total) by mouth daily. 60 tablet 3   atorvastatin (LIPITOR) 20 MG tablet Take 1 tablet (20 mg total) by mouth daily. 60 tablet 3   ferrous sulfate 325 (65 FE) MG EC tablet Take 1 tablet (325 mg  total) by mouth daily with breakfast. 30 tablet 3   lidocaine-prilocaine (EMLA) cream Apply 1 application topically as needed. 30 g 2   metFORMIN (GLUCOPHAGE) 500 MG tablet Take 1 tablet (500 mg total) by mouth 2 (two) times daily with a meal. 60 tablet 1   olmesartan (BENICAR) 40 MG tablet      No current facility-administered medications for this visit.    PHYSICAL EXAMINATION: ECOG PERFORMANCE STATUS: {CHL ONC ECOG PS:[phone]}  There were no vitals filed for this visit. Wt Readings from Last 3 Encounters:  06/22/22 152 lb (68.9 kg)  06/21/22 155 lb (70.3 kg)  05/11/22 154 lb 9.6 oz (70.1 kg)    {Only keep what was examined. If exam not performed, can use .CEXAM } GENERAL:alert, no distress and comfortable SKIN: skin color, texture, turgor are normal, no rashes or significant lesions EYES: normal, Conjunctiva are pink and non-injected, sclera clear {OROPHARYNX:no exudate, no erythema and lips, buccal mucosa, and tongue normal}  NECK: supple, thyroid normal size, non-tender, without nodularity LYMPH:  no palpable lymphadenopathy in the cervical, axillary {or inguinal} LUNGS: clear to auscultation and percussion with normal breathing effort HEART: regular rate & rhythm and no murmurs and no lower extremity edema ABDOMEN:abdomen soft, non-tender and normal bowel sounds Musculoskeletal:no cyanosis of digits and no clubbing  NEURO: alert & oriented x 3 with fluent speech, no focal motor/sensory deficits  LABORATORY DATA:  I have reviewed the data as listed    Latest Ref Rng & Units 06/22/2022    8:16 AM 06/01/2022    9:36 AM 05/11/2022    8:04 AM  CBC  WBC 4.0 - 10.5 K/uL 4.8  4.4  5.2   Hemoglobin 12.0 - 15.0 g/dL 78.2  9.2  9.2   Hematocrit 36.0 - 46.0 % 29.5  28.0  27.3   Platelets 150 - 400 K/uL 143  130  145         Latest Ref Rng & Units 06/22/2022    8:16 AM 06/01/2022    9:36 AM 05/11/2022    8:04 AM  CMP  Glucose 70 - 99 mg/dL 956  94  213   BUN 8 - 23 mg/dL 17   18  21    Creatinine 0.44 - 1.00 mg/dL 0.86  5.78  4.69   Sodium 135 - 145 mmol/L 143  142  142   Potassium 3.5 - 5.1 mmol/L 3.7  3.5  3.5   Chloride 98 - 111 mmol/L 112  112  111   CO2 22 - 32 mmol/L 24  26  24    Calcium 8.9 - 10.3 mg/dL 62.9  52.8  41.3   Total Protein 6.5 - 8.1 g/dL 6.9  6.8  7.0   Total Bilirubin 0.3 - 1.2 mg/dL 0.8  0.8  0.7   Alkaline Phos 38 - 126 U/L 98  78  95   AST 15 - 41 U/L 51  61  51   ALT 0 - 44 U/L 25  24  26        RADIOGRAPHIC STUDIES: I have personally reviewed the radiological images as listed and agreed with the findings in the report. No results  found.    No orders of the defined types were placed in this encounter.  All questions were answered. The patient knows to call the clinic with any problems, questions or concerns. No barriers to learning was detected. The total time spent in the appointment was {CHL ONC TIME VISIT - ZOXWR:[id number]}.     Salome Holmes, CMA 07/05/2022   I, Monica Martinez, CMA, am acting as scribe for Malachy Mood, MD.   {Add scribe attestation statement}

## 2022-07-13 ENCOUNTER — Inpatient Hospital Stay: Payer: Medicare HMO

## 2022-07-13 ENCOUNTER — Inpatient Hospital Stay: Payer: Medicare HMO | Admitting: Hematology

## 2022-07-13 ENCOUNTER — Telehealth: Payer: Self-pay | Admitting: Hematology

## 2022-07-13 DIAGNOSIS — Z7189 Other specified counseling: Secondary | ICD-10-CM | POA: Insufficient documentation

## 2022-07-13 NOTE — Assessment & Plan Note (Deleted)
-  brain MRI 05/30/2022 showed prior bilateral basal ganglia lacunar infarcts  -I encouraged her to use walker -She previously had a physical therapy, and declined home PT for now. 

## 2022-07-13 NOTE — Assessment & Plan Note (Deleted)
[id number] stage IV, grade 2-3, ER-/PR- HER2+, with nodal and pulmonary metastases -She initially self palpated a left breast mass around 06/2020. Work-up showed 2 masses in the left breast UOQ and 3 abnormal lymph nodes. Biopsy showed IDC to both masses and biopsied lymph node. -PET scan 08/28/20 showed: hypermetabolism to left breast and axilla, retropectoral and supraclavicular adenopathy, and several 3-6 mm pulmonary nodules. -She started first-line Kadcyla on 09/11/20. She tolerates well with some fatigue, dose decreased with C4.  -She has had good clinical response, breast mass was no longer palpable on physical exam 06/09/21.  -last PET scan in 01/2022 showed two small lung nodule (5-23mm), slightly bigger since last scan, no other hypermetabolic lesions.   -Her restaging CT scan from June 17, 2022 showed multiple enlarging lung nodules, largest measuring 1 cm, previously 0.6 cm.  This is highly concerning for pulmonary metastasis with slight disease progression. No other new lesions -We discussed treatment options, such as enhertu, neratinib, tucatinib etc.  Due to her advanced age, multiple falls, history of strokes, she is not a candidate for intravenous or oral cytotoxic chemotherapy. -I also discussed the option of palliative care and hospice, due to her advanced age and limited performance status. -after detailed discussion with pt and her daughter, pt wishes to proceed with more treatment but avoid toxic chemotherapy. She started Tucatinib in April 2024, but did not tolerated well with excessive drowsiness and fatigue.  She stopped after few days of treatment. -We discussed other treatment options, and I encouraged her to try Enhertu, potential benefit and side effects discussed with her and her daughter, especially risk of pneumonitis, she agrees to proceed.

## 2022-07-13 NOTE — Assessment & Plan Note (Deleted)
-  We again discussed the incurable nature of her cancer, and the overall poor prognosis, especially if she does not have good response to chemotherapy or progress on chemo -The patient understands the goal of care is palliative. -I recommend DNR/DNI, she will think about it   

## 2022-07-13 NOTE — Telephone Encounter (Signed)
Patient called stating that her ride never arrived to bring her for her appointments on 5/8. Spoke with MD. MD gave OK to reschedule lab/flush and OV. MD stated will update tx plan. Waiting on transportation liaison to f/u.

## 2022-07-22 ENCOUNTER — Ambulatory Visit: Payer: Medicare HMO | Admitting: Hematology

## 2022-07-22 ENCOUNTER — Other Ambulatory Visit: Payer: Medicare HMO

## 2022-07-24 NOTE — Progress Notes (Unsigned)
Patient Care Team: [name], MD as PCP - General (Internal Medicine) Malachy Mood, MD as Consulting Physician (Medical Oncology)   CHIEF COMPLAINT: Follow up metastatic breast cancer   Oncology History Overview Note  Cancer Staging Malignant neoplasm of upper-outer quadrant of female breast [facility]) Staging form: Breast, AJCC 8th Edition - Clinical stage from 07/31/2020: Stage IV (cT2, cN3, cM1, G2, ER-, PR-, HER2+) - Signed by Pollyann Samples, NP on 08/31/2020 Stage prefix: Initial diagnosis Nuclear grade: G2 Histologic grading system: 3 grade system    Malignant neoplasm of upper-outer quadrant of female breast (HCC)  07/24/2020 Breast US   On physical exam, a very firm mass is identified at the 1:30 position of the LEFT breast 5 cm from the nipple.   IMPRESSION: 1. Highly suspicious 4.3 cm mass at the 1:30 position of the LEFT breast and highly suspicious 2.7 cm mass at the 2 o'clock position of the LEFT breast, with the 2 masses encompassing an area measuring at least 6.1 cm. 2. 3 abnormal LEFT axillary lymph nodes with cortical thickening, tissue sampling of 1 of these lymph nodes recommended. 3. Anterior LEFT breast skin thickening nonspecific but may reflect dermal involvement. 4. No mammographic evidence of RIGHT breast malignancy.   07/31/2020 Initial Biopsy   Diagnosis 1. Breast, left, needle core biopsy, 1 :30 pm, 5cmfn - INVASIVE DUCTAL CARCINOMA, SEE COMMENT. 2. Breast, left, needle core biopsy, 2 o'clock, 8cmfn - INVASIVE DUCTAL CARCINOMA, SEE COMMENT. - DUCTAL CARCINOMA IN SITU. 3. Lymph node, needle/core biopsy, left axillary - METASTATIC CARCINOMA IN A LYMPH NODE. Microscopic Comment 1. and 3. The carcinoma in both parts has a similar morphology and appears grade 2-3. The DCIS in part 2 is high-grade with necrosis. The carcinoma measures 15 mm (part 1) and 14 mm (part 2) in greatest linear extent  2. PROGNOSTIC INDICATORS Results: IMMUNOHISTOCHEMICAL AND  MORPHOMETRIC ANALYSIS PERFORMED MANUALLY The tumor cells are POSITIVE for Her2 (3+). Estrogen Receptor: 0%, NEGATIVE Progesterone Receptor: 0%, NEGATIVE Proliferation Marker Ki67: 20%    07/31/2020 Cancer Staging   Staging form: Breast, AJCC 8th Edition - Clinical stage from 07/31/2020: Stage IV (cT2, cN3, cM1, G2, ER-, PR-, HER2+) - Signed by Pollyann Samples, NP on 08/31/2020 Stage prefix: Initial diagnosis Nuclear grade: G2 Histologic grading system: 3 grade system   08/13/2020 Initial Diagnosis   Malignant neoplasm of upper-outer quadrant of female breast (HCC)   08/28/2020 PET scan   IMPRESSION: Signs of LEFT breast cancer with LEFT axillary and retropectoral adenopathy as well as LEFT supraclavicular adenopathy.   Signs of pulmonary metastatic disease.   Mild asymmetric uptake in the LEFT as compared to the RIGHT adrenal gland. Equivocal but suspicious, attention on follow-up.   Aortic atherosclerosis.   Cystic changes and potential bronchiectasis in the RIGHT lung base likely post infectious or inflammatory. Correlate with any respiratory symptoms. Comparison with prior imaging may be helpful with attention on follow-up.   08/28/2020 Imaging   BRAIN MRI IMPRESSION: 1. Subacute lacunar infarct of the left basal ganglia with no malignant hemorrhagic transformation or mass effect. Underlying chronic bilateral basal ganglia ischemia. 2. No metastatic disease or other acute intracranial abnormality identified.   08/28/2020 Imaging   Breast MRI   IMPRESSION: 1. 8.1 x 8.1 x 5.1 cm area of biopsy-proven malignancy in the central, upper outer and lower outer quadrants of the left breast. 2. Diffuse anterior left breast skin thickening without abnormal enhancement. This may represent thickening due to lymphedema associated with lymphatic  obstruction. 3. Metastatic level 1 and level 2 left axillary adenopathy. 4. No evidence of malignancy on the right.   09/11/2020 - 11/03/2021  Chemotherapy   Patient is on Treatment Plan : BREAST ADO-Trastuzumab Emtansine (Kadcyla) q21d     09/11/2020 -  Chemotherapy   Patient is on Treatment Plan : BREAST ADO-Trastuzumab Emtansine (Kadcyla) q21d     11/30/2020 Imaging   CT Chest w/o contrast  IMPRESSION: 1. Interval response to therapy. The left breast mass has mildly decreased in size in the interval. 2. Decrease in size of left axillary and left retropectoral lymph nodes. 3. Small right lung nodules stable to improved. 4. Coronary artery calcifications noted. 5. Aortic Atherosclerosis (ICD10-I70.0).   03/04/2021 PET scan   IMPRESSION: 1. Overall marked improvement compared to the prior PET-CT, with resolution of hypermetabolic activity and pathologic enlargement of the previously involved lymph nodes; lack of hypermetabolic activity and nonvisualization of prior right-sided lung nodules, and substantial reduction in size and activity in the left breast primary. 2. New ground-glass opacity medially in the right lower lobe with low-grade metabolic activity, highly likely to be inflammatory (alveolitis) given the morphology and appearance. 3. Other imaging findings of potential clinical significance: Aortic Atherosclerosis (ICD10-I70.0). Coronary atherosclerosis. Mitral valve calcifications. Punctate nonobstructive left renal calculi. Lower lumbar impingement due to chronic spondylosis and degenerative disc disease. Remote left basal ganglia infarct.   06/07/2021 Imaging   EXAM: CT CHEST, ABDOMEN AND PELVIS WITHOUT CONTRAST  IMPRESSION: 1. Scattered tiny bilateral pulmonary nodules are similar to the prior exams, favored to be benign. 2. Otherwise, no evidence of metastatic disease within the chest, abdomen, or pelvis. 3. Right lower lobe ground-glass nodule has resolved since the prior PET 4.  Possible constipation. 5. Coronary artery atherosclerosis. Aortic Atherosclerosis (ICD10-I70.0). Emphysema (ICD10-J43.9). 6.  Left nephrolithiasis.   06/15/2022 Imaging    IMPRESSION: 1. Mild increase in size of bilateral pulmonary nodules compatible with progression of metastatic disease. 2. No evidence for metastatic disease within the abdomen or pelvis. 3. Large volume of desiccated stool identified within the rectum. Correlate for any clinical signs or symptoms of constipation. 4. Nonobstructing left renal calculus. 5. Aortic Atherosclerosis (ICD10-I70.0).        CURRENT THERAPY: Kadcyla q3 weeks starting 09/2020, add Tucatinib 150 mg BID for first month   INTERVAL HISTORY Ms. Wesby returns for follow up and treatment as scheduled. Last seen by Dr. Mosetta Putt 06/22/22. She continues Kadcyla, and started dose-reduced Tucatinib in the interim. She took 1 dose and slept the rest of the day, didn't feel like eating. Fatigue lasted a couple more days but no other side effects. Family called 4/29 reported that pt is tired and sleepy and [name] was stopped. She is doing well otherwise except weakness in the R knee, couldn't get her brace to work. She plans to call ortho for instructions. A couple days she has noticed vaginal spotting in her pad. Sometimes a larger amount.   ROS  All other systems reviewed and negative  Past Medical History:  Diagnosis Date   Diabetes mellitus without complication (HCC)    Hyperlipidemia    Hypertension      Past Surgical History:  Procedure Laterality Date   FOOT FRACTURE SURGERY Right    IR IMAGING GUIDED PORT INSERTION  09/22/2020   IR RADIOLOGIST EVAL & MGMT  09/25/2020     Outpatient Encounter Medications as of 07/25/2022  Medication Sig   aspirin EC 325 MG tablet Take 1 tablet (325 mg total) by mouth  daily.   atorvastatin (LIPITOR) 20 MG tablet Take 1 tablet (20 mg total) by mouth daily.   ferrous sulfate 325 (65 FE) MG EC tablet Take 1 tablet (325 mg total) by mouth daily with breakfast.   lidocaine-prilocaine (EMLA) cream Apply 1 application topically as needed.    metFORMIN (GLUCOPHAGE) 500 MG tablet Take 1 tablet (500 mg total) by mouth 2 (two) times daily with a meal.   olmesartan (BENICAR) 40 MG tablet    Facility-Administered Encounter Medications as of 07/25/2022  Medication   [COMPLETED] heparin lock flush 100 unit/mL   [COMPLETED] sodium chloride flush (NS) 0.9 % injection 10 mL     Today's Vitals   07/25/22 0827  BP: 133/74  Pulse: 91  Resp: 14  Temp: 98.3 F (36.8 C)  TempSrc: Oral  SpO2: 100%  Weight: 147 lb 1.6 oz (66.7 kg)  Height: 5\' 4"  (1.626 m)   Body mass index is 25.25 kg/m.   PHYSICAL EXAM GENERAL:alert, no distress and comfortable SKIN: no rash  EYES: sclera clear  LUNGS: clear with normal breathing effort HEART: regular rate & rhythm, no lower extremity edema ABDOMEN: abdomen soft, non-tender and normal bowel sounds NEURO: alert & oriented x 3 with fluent speech, no focal motor/sensory deficits PAC without erythema    CBC    Component Value Date/Time   WBC 6.3 07/25/2022 0813   WBC 6.0 06/25/2019 1324   RBC 4.19 07/25/2022 0813   HGB 10.6 (L) 07/25/2022 0813   HCT 31.5 (L) 07/25/2022 0813   PLT 143 (L) 07/25/2022 0813   MCV 75.2 (L) 07/25/2022 0813   MCH 25.3 (L) 07/25/2022 0813   MCHC 33.7 07/25/2022 0813   RDW 17.8 (H) 07/25/2022 0813   LYMPHSABS 2.4 07/25/2022 0813   MONOABS 0.5 07/25/2022 0813   EOSABS 0.4 07/25/2022 0813   BASOSABS 0.0 07/25/2022 0813     CMP     Component Value Date/Time   NA 142 07/25/2022 0813   K 3.3 (L) 07/25/2022 0813   CL 107 07/25/2022 0813   CO2 26 07/25/2022 0813   GLUCOSE 97 07/25/2022 0813   BUN 25 (H) 07/25/2022 0813   CREATININE 1.89 (H) 07/25/2022 0813   CALCIUM 10.7 (H) 07/25/2022 0813   PROT 7.0 07/25/2022 0813   ALBUMIN 3.6 07/25/2022 0813   AST 51 (H) 07/25/2022 0813   ALT 25 07/25/2022 0813   ALKPHOS 89 07/25/2022 0813   BILITOT 0.9 07/25/2022 0813   GFRNONAA 26 (L) 07/25/2022 0813   GFRAA 39 (L) 06/25/2019 1324      ASSESSMENT &  PLAN:Christy [name] is a 82 y.o. female with    Malignant neoplasm of upper-outer quadrant of female breast; cT2N3M1 stage IV, grade 2-3, ER-/PR- HER2+, with nodal and pulmonary metastases -She initially self palpated a left breast mass around 06/2020. Work-up showed 2 masses in the left breast UOQ and 3 abnormal lymph nodes. 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 -PET scan 08/28/20 showed: hypermetabolism to left breast and axilla, retropectoral and supraclavicular adenopathy, and several 3-6 mm pulmonary nodules. -She started first-line Kadcyla on 09/11/20. She tolerates well with some fatigue, dose decreased with C4.  -She has had good clinical response, breast mass was no longer palpable on physical exam 06/09/21.  -last PET scan in 01/2022 showed two small lung nodule (5-85mm), slightly bigger since last scan, no other hypermetabolic lesions.   -Her restaging CT scan from June 17, 2022 showed multiple enlarging lung nodules, largest measuring 1  cm, previously 0.6 cm.  This is highly concerning for pulmonary metastasis with slight disease progression. No other new lesions -Dr. Mosetta Putt and pt/family discussed options, including treatment vs palliative care or hospice. Family wished to try more treatment  -Based on the [id number] trial, I will continue Kadcyla, and oral Tucatinib.  -Ms. Pharo appears stable. She continues q3 week Kadcyla and began dose-reduced Tucatinib at half dose 150 mg twice daily. She tried 1 dose and felt very tired after, slept through a meal, and stopped it. Fatigue lasted a few days after. She has recovered to baseline.  -I called her daughter Jodene Nam and we all discussed treatment options such as re-challenging Tucatinib at lower dose and continue kadcyla vs switching to enhertu which Dr. Mosetta Putt feels would be better options. I reviewed potential side effects.  -Ms. Wrege would like to try lower dose Tucatinib, I will start at 50 mg po BID for a week to see how  she does, then titrate as tolerable.  -If she does not tolerate low dose, will switch Tucatinib/kadcyla to enhertu if she is willing to try.  -Labs reviewed, will give IVF + K with kadcyla today, she will take oral KCL daily at home.  -Keep 5/29 f/up for close monitoring    2. Vaginal spotting -? Secondary to restarting Aspirin 325 mg -Hold aspirin until bleeding resolves. Then start Aspirin 81 mg   PLAN: -Labs reviewed -Proceed with Kadcyla today  -Restart Tucatinib 50 mg po BID for 1 week, then phone f/up; titrate dose if tolerating -IVF + K 20 meq po x1 today, continue po K daily at home -Stop Aspirin 325 mg for bleeding. May start 81 mg when bleeding resolves -F/up and kadcyla in 3 weeks     All questions were answered. The patient knows to call the clinic with any problems, questions or concerns. No barriers to learning were detected. I spent 20 minutes counseling the patient face to face. The total time spent in the appointment was 30 minutes and more than 50% was on counseling, review of test results, and coordination of care.   Santiago Glad, NP-C 07/25/2022

## 2022-07-25 ENCOUNTER — Other Ambulatory Visit: Payer: Self-pay

## 2022-07-25 ENCOUNTER — Inpatient Hospital Stay: Payer: Medicare HMO

## 2022-07-25 ENCOUNTER — Inpatient Hospital Stay (HOSPITAL_BASED_OUTPATIENT_CLINIC_OR_DEPARTMENT_OTHER): Payer: Medicare HMO | Admitting: Nurse Practitioner

## 2022-07-25 ENCOUNTER — Encounter: Payer: Self-pay | Admitting: Nurse Practitioner

## 2022-07-25 ENCOUNTER — Inpatient Hospital Stay: Payer: Medicare HMO | Attending: Nurse Practitioner

## 2022-07-25 ENCOUNTER — Other Ambulatory Visit (HOSPITAL_COMMUNITY): Payer: Self-pay

## 2022-07-25 VITALS — BP 133/74 | HR 91 | Temp 98.3°F | Resp 14 | Ht 64.0 in | Wt 147.1 lb

## 2022-07-25 VITALS — BP 145/69 | HR 82 | Resp 16

## 2022-07-25 DIAGNOSIS — N200 Calculus of kidney: Secondary | ICD-10-CM | POA: Insufficient documentation

## 2022-07-25 DIAGNOSIS — Z5112 Encounter for antineoplastic immunotherapy: Secondary | ICD-10-CM | POA: Insufficient documentation

## 2022-07-25 DIAGNOSIS — Z8673 Personal history of transient ischemic attack (TIA), and cerebral infarction without residual deficits: Secondary | ICD-10-CM | POA: Insufficient documentation

## 2022-07-25 DIAGNOSIS — C50412 Malignant neoplasm of upper-outer quadrant of left female breast: Secondary | ICD-10-CM

## 2022-07-25 DIAGNOSIS — Z171 Estrogen receptor negative status [ER-]: Secondary | ICD-10-CM | POA: Diagnosis not present

## 2022-07-25 DIAGNOSIS — C773 Secondary and unspecified malignant neoplasm of axilla and upper limb lymph nodes: Secondary | ICD-10-CM | POA: Diagnosis not present

## 2022-07-25 DIAGNOSIS — J439 Emphysema, unspecified: Secondary | ICD-10-CM | POA: Diagnosis not present

## 2022-07-25 DIAGNOSIS — Z95828 Presence of other vascular implants and grafts: Secondary | ICD-10-CM

## 2022-07-25 DIAGNOSIS — C7800 Secondary malignant neoplasm of unspecified lung: Secondary | ICD-10-CM | POA: Diagnosis not present

## 2022-07-25 DIAGNOSIS — I700 Atherosclerosis of aorta: Secondary | ICD-10-CM | POA: Diagnosis not present

## 2022-07-25 DIAGNOSIS — Z79899 Other long term (current) drug therapy: Secondary | ICD-10-CM | POA: Insufficient documentation

## 2022-07-25 DIAGNOSIS — I2510 Atherosclerotic heart disease of native coronary artery without angina pectoris: Secondary | ICD-10-CM | POA: Insufficient documentation

## 2022-07-25 LAB — CBC WITH DIFFERENTIAL (CANCER CENTER ONLY)
Abs Immature Granulocytes: 0.02 10*3/uL (ref 0.00–0.07)
Basophils Absolute: 0 10*3/uL (ref 0.0–0.1)
Basophils Relative: 1 %
Eosinophils Absolute: 0.4 10*3/uL (ref 0.0–0.5)
Eosinophils Relative: 6 %
HCT: 31.5 % — ABNORMAL LOW (ref 36.0–46.0)
Hemoglobin: 10.6 g/dL — ABNORMAL LOW (ref 12.0–15.0)
Immature Granulocytes: 0 %
Lymphocytes Relative: 38 %
Lymphs Abs: 2.4 10*3/uL (ref 0.7–4.0)
MCH: 25.3 pg — ABNORMAL LOW (ref 26.0–34.0)
MCHC: 33.7 g/dL (ref 30.0–36.0)
MCV: 75.2 fL — ABNORMAL LOW (ref 80.0–100.0)
Monocytes Absolute: 0.5 10*3/uL (ref 0.1–1.0)
Monocytes Relative: 8 %
Neutro Abs: 3 10*3/uL (ref 1.7–7.7)
Neutrophils Relative %: 47 %
Platelet Count: 143 10*3/uL — ABNORMAL LOW (ref 150–400)
RBC: 4.19 MIL/uL (ref 3.87–5.11)
RDW: 17.8 % — ABNORMAL HIGH (ref 11.5–15.5)
WBC Count: 6.3 10*3/uL (ref 4.0–10.5)
nRBC: 0 % (ref 0.0–0.2)

## 2022-07-25 LAB — CMP (CANCER CENTER ONLY)
ALT: 25 U/L (ref 0–44)
AST: 51 U/L — ABNORMAL HIGH (ref 15–41)
Albumin: 3.6 g/dL (ref 3.5–5.0)
Alkaline Phosphatase: 89 U/L (ref 38–126)
Anion gap: 9 (ref 5–15)
BUN: 25 mg/dL — ABNORMAL HIGH (ref 8–23)
CO2: 26 mmol/L (ref 22–32)
Calcium: 10.7 mg/dL — ABNORMAL HIGH (ref 8.9–10.3)
Chloride: 107 mmol/L (ref 98–111)
Creatinine: 1.89 mg/dL — ABNORMAL HIGH (ref 0.44–1.00)
GFR, Estimated: 26 mL/min — ABNORMAL LOW (ref >60)
Glucose, Bld: 97 mg/dL (ref 70–99)
Potassium: 3.3 mmol/L — ABNORMAL LOW (ref 3.5–5.1)
Sodium: 142 mmol/L (ref 135–145)
Total Bilirubin: 0.9 mg/dL (ref 0.3–1.2)
Total Protein: 7 g/dL (ref 6.5–8.1)

## 2022-07-25 MED ORDER — SODIUM CHLORIDE 0.9 % IV SOLN: Freq: Once | INTRAVENOUS | Status: AC

## 2022-07-25 MED ORDER — HEPARIN SOD (PORK) LOCK FLUSH 100 UNIT/ML IV SOLN
500.0000 [IU] | Freq: Once | INTRAVENOUS | Status: AC
  Administered 2022-07-25: 500 [IU]

## 2022-07-25 MED ORDER — POTASSIUM CHLORIDE CRYS ER 20 MEQ PO TBCR
20.0000 meq | EXTENDED_RELEASE_TABLET | Freq: Once | ORAL | Status: AC
  Administered 2022-07-25: 20 meq via ORAL
  Filled 2022-07-25: qty 1

## 2022-07-25 MED ORDER — HEPARIN SOD (PORK) LOCK FLUSH 100 UNIT/ML IV SOLN
500.0000 [IU] | Freq: Once | INTRAVENOUS | Status: AC | PRN
  Administered 2022-07-25: 500 [IU]

## 2022-07-25 MED ORDER — SODIUM CHLORIDE 0.9% FLUSH
10.0000 mL | Freq: Once | INTRAVENOUS | Status: AC
  Administered 2022-07-25: 10 mL

## 2022-07-25 MED ORDER — TUCATINIB 50 MG PO TABS
50.0000 mg | ORAL_TABLET | Freq: Two times a day (BID) | ORAL | 0 refills | Status: DC
  Filled 2022-07-25 – 2022-07-26 (×2): qty 60, 30d supply, fill #0

## 2022-07-25 MED ORDER — SODIUM CHLORIDE 0.9% FLUSH
10.0000 mL | INTRAVENOUS | Status: DC | PRN
  Administered 2022-07-25: 10 mL

## 2022-07-25 MED ORDER — POTASSIUM CHLORIDE CRYS ER 20 MEQ PO TBCR: 20.0000 meq | EXTENDED_RELEASE_TABLET | Freq: Every day | ORAL | 0 refills | Status: DC

## 2022-07-25 MED ORDER — DIPHENHYDRAMINE HCL 25 MG PO CAPS
50.0000 mg | ORAL_CAPSULE | Freq: Once | ORAL | Status: AC
  Administered 2022-07-25: 50 mg via ORAL
  Filled 2022-07-25: qty 2

## 2022-07-25 MED ORDER — ACETAMINOPHEN 325 MG PO TABS
650.0000 mg | ORAL_TABLET | Freq: Once | ORAL | Status: AC
  Administered 2022-07-25: 650 mg via ORAL
  Filled 2022-07-25: qty 2

## 2022-07-25 MED ORDER — SODIUM CHLORIDE 0.9 % IV SOLN
3.0000 mg/kg | Freq: Once | INTRAVENOUS | Status: AC
  Administered 2022-07-25: 200 mg via INTRAVENOUS
  Filled 2022-07-25: qty 10

## 2022-07-25 NOTE — Patient Instructions (Signed)
[facility]  Discharge Instructions: Thank you for choosing [facility] to provide your oncology and hematology care.   If you have a lab appointment with the Cancer Center, please go directly to the Cancer Center and check in at the registration area.   Wear comfortable clothing and clothing appropriate for easy access to any Portacath or PICC line.   We strive to give you quality time with your provider. You may need to reschedule your appointment if you arrive late (15 or more minutes).  Arriving late affects you and other patients whose appointments are after yours.  Also, if you miss three or more appointments without notifying the office, you may be dismissed from the clinic at the provider's discretion.      For prescription refill requests, have your pharmacy contact our office and allow 72 hours for refills to be completed.    Today you received the following chemotherapy and/or immunotherapy agents: Kadcyla.       To help prevent nausea and vomiting after your treatment, we encourage you to take your nausea medication as directed.  BELOW ARE SYMPTOMS THAT SHOULD BE REPORTED IMMEDIATELY: *FEVER GREATER THAN 100.4 F (38 C) OR HIGHER *CHILLS OR SWEATING *NAUSEA AND VOMITING THAT IS NOT CONTROLLED WITH YOUR NAUSEA MEDICATION *UNUSUAL SHORTNESS OF BREATH *UNUSUAL BRUISING OR BLEEDING *URINARY PROBLEMS (pain or burning when urinating, or frequent urination) *BOWEL PROBLEMS (unusual diarrhea, constipation, pain near the anus) TENDERNESS IN MOUTH AND THROAT WITH OR WITHOUT PRESENCE OF ULCERS (sore throat, sores in mouth, or a toothache) UNUSUAL RASH, SWELLING OR PAIN  UNUSUAL VAGINAL DISCHARGE OR ITCHING   Items with * indicate a potential emergency and should be followed up as soon as possible or go to the Emergency Department if any problems should occur.  Please show the CHEMOTHERAPY ALERT CARD or IMMUNOTHERAPY ALERT CARD at  check-in to the Emergency Department and triage nurse.  Should you have questions after your visit or need to cancel or reschedule your appointment, please contact [facility]  Dept: [phone]  and follow the prompts.  Office hours are 8:00 a.m. to 4:30 p.m. Monday - Friday. Please note that voicemails left after 4:00 p.m. may not be returned until the following business day.  We are closed weekends and major holidays. You have access to a nurse at all times for urgent questions. Please call the main number to the clinic Dept: [phone] and follow the prompts.   For any non-urgent questions, you may also contact your provider using MyChart. We now offer e-Visits for anyone 18 and older to request care online for non-urgent symptoms. For details visit [email or web address].   Also download the MyChart app! Go to the app store, search "MyChart", open the app, select [facility], and log in with your MyChart username and password.   

## 2022-07-26 ENCOUNTER — Other Ambulatory Visit: Payer: Self-pay

## 2022-07-26 ENCOUNTER — Other Ambulatory Visit (HOSPITAL_COMMUNITY): Payer: Self-pay

## 2022-07-27 LAB — CANCER ANTIGEN 27.29: CA 27.29: 30.9 U/mL (ref 0.0–38.6)

## 2022-08-02 NOTE — Assessment & Plan Note (Signed)
[id number] stage IV, grade 2-3, ER-/PR- HER2+, with nodal and pulmonary metastases -She initially self palpated a left breast mass around 06/2020. Work-up showed 2 masses in the left breast UOQ and 3 abnormal lymph nodes. 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 -PET scan 08/28/20 showed: hypermetabolism to left breast and axilla, retropectoral and supraclavicular adenopathy, and several 3-6 mm pulmonary nodules. -She started first-line Kadcyla on 09/11/20. She tolerates well with some fatigue, dose decreased with C4.  -She has had good clinical response, breast mass was no longer palpable on physical exam 06/09/21.  -last PET scan in 01/2022 showed two small lung nodule (5-30mm), slightly bigger since last scan, no other hypermetabolic lesions.   -Her restaging CT scan from June 17, 2022 showed multiple enlarging lung nodules, largest measuring 1 cm, previously 0.6 cm.  This is highly concerning for pulmonary metastasis with slight disease progression. No other new lesions -she tried tucatinib 150mg  twice daily but did not tolerate with excessive fatigue and drowsiness -she restarted Tucatinib at 50mg  bid last week

## 2022-08-02 NOTE — Progress Notes (Signed)
Christy [name]   [phone] [phone]   Clinic Follow up Note   Patient Care Team: [name], MD as PCP - General (Internal Medicine) Malachy Mood, MD as Consulting Physician (Medical Oncology)  Date of Service:  08/03/2022  CHIEF COMPLAINT: f/u of metastatic breast cancer  CURRENT THERAPY:  Kadcyla q3 weeks starting 09/2020, add Tucatinib 150 mg BID for first month   ASSESSMENT:  Christy [name] is a 82 y.o. 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 Work-up showed 2 masses in the left breast UOQ and 3 abnormal lymph nodes. 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 -PET scan 08/28/20 showed: hypermetabolism to left breast and axilla, retropectoral and supraclavicular adenopathy, and several 3-6 mm pulmonary nodules. -She started first-line Kadcyla on 09/11/20. She tolerates well with some fatigue, dose decreased with C4.  -She has had good clinical response, breast mass was no longer palpable on physical exam 06/09/21.  -last PET scan in 01/2022 showed two small lung nodule (5-35mm), slightly bigger since last scan, no other hypermetabolic lesions.   -Her restaging CT scan from June 17, 2022 showed multiple enlarging lung nodules, largest measuring 1 cm, previously 0.6 cm.  This is highly concerning for pulmonary metastasis with slight disease progression. No other new lesions -she tried tucatinib 150mg  twice daily but did not tolerate with excessive fatigue and drowsiness -she restarted Tucatinib at 50mg  bid last week , she is tolerating well -I recommend her to increase Tucatinib to 100mg  bid, she agrees -f/u in 2 weeks      PLAN: -lab reviewed - I increase Tucatinib  50 mg from 1 tablet to two tablets twice a day ,starting tomorrow. I called her  daughter and left her a VM also  -lab/flush, f/u and Kadcyla on  6/10    SUMMARY OF ONCOLOGIC HISTORY: Oncology History Overview Note  Cancer Staging Malignant neoplasm of upper-outer quadrant of female breast (HCC) Staging form: Breast, AJCC 8th Edition - Clinical stage from 07/31/2020: Stage IV (cT2, cN3, cM1, G2, ER-, PR-, HER2+) - Signed by Pollyann Samples, NP on 08/31/2020 Stage prefix: Initial diagnosis Nuclear grade: G2 Histologic grading system: 3 grade system    Malignant neoplasm of upper-outer quadrant of female breast (HCC)  07/24/2020 Breast US   On physical exam, a very firm mass is identified at the 1:30 position of the LEFT breast 5 cm from the nipple.   IMPRESSION: 1. Highly suspicious 4.3 cm mass at the 1:30 position of the LEFT breast and highly suspicious 2.7 cm mass at the 2 o'clock position of the LEFT breast, with the 2 masses encompassing an area measuring at least 6.1 cm. 2. 3 abnormal LEFT axillary lymph nodes with cortical thickening, tissue sampling of 1 of these lymph nodes recommended. 3. Anterior LEFT breast skin thickening nonspecific but may reflect dermal involvement. 4. No mammographic evidence of RIGHT breast malignancy.   07/31/2020 Initial Biopsy   Diagnosis 1. Breast, left, needle core biopsy, 1 :30 pm, 5cmfn - INVASIVE DUCTAL CARCINOMA, SEE COMMENT. 2. Breast, left, needle core biopsy, 2 o'clock, 8cmfn - INVASIVE DUCTAL CARCINOMA, SEE COMMENT. - DUCTAL CARCINOMA IN SITU. 3. Lymph node, needle/core biopsy, left axillary - METASTATIC CARCINOMA IN A LYMPH NODE. Microscopic Comment 1. and 3. The carcinoma in both parts has a similar morphology and  appears grade 2-3. The DCIS in part 2 is high-grade with necrosis. The carcinoma measures 15 mm (part 1) and 14 mm (part 2) in greatest linear extent  2. PROGNOSTIC INDICATORS Results: IMMUNOHISTOCHEMICAL AND MORPHOMETRIC ANALYSIS PERFORMED MANUALLY The tumor cells are POSITIVE for Her2  (3+). Estrogen Receptor: 0%, NEGATIVE Progesterone Receptor: 0%, NEGATIVE Proliferation Marker Ki67: 20%    07/31/2020 Cancer Staging   Staging form: Breast, AJCC 8th Edition - Clinical stage from 07/31/2020: Stage IV (cT2, cN3, cM1, G2, ER-, PR-, HER2+) - Signed by Pollyann Samples, NP on 08/31/2020 Stage prefix: Initial diagnosis Nuclear grade: G2 Histologic grading system: 3 grade system   08/13/2020 Initial Diagnosis   Malignant neoplasm of upper-outer quadrant of female breast (HCC)   08/28/2020 PET scan   IMPRESSION: Signs of LEFT breast cancer with LEFT axillary and retropectoral adenopathy as well as LEFT supraclavicular adenopathy.   Signs of pulmonary metastatic disease.   Mild asymmetric uptake in the LEFT as compared to the RIGHT adrenal gland. Equivocal but suspicious, attention on follow-up.   Aortic atherosclerosis.   Cystic changes and potential bronchiectasis in the RIGHT lung base likely post infectious or inflammatory. Correlate with any respiratory symptoms. Comparison with prior imaging may be helpful with attention on follow-up.   08/28/2020 Imaging   BRAIN MRI IMPRESSION: 1. Subacute lacunar infarct of the left basal ganglia with no malignant hemorrhagic transformation or mass effect. Underlying chronic bilateral basal ganglia ischemia. 2. No metastatic disease or other acute intracranial abnormality identified.   08/28/2020 Imaging   Breast MRI   IMPRESSION: 1. 8.1 x 8.1 x 5.1 cm area of biopsy-proven malignancy in the central, upper outer and lower outer quadrants of the left breast. 2. Diffuse anterior left breast skin thickening without abnormal enhancement. This may represent thickening due to lymphedema associated with lymphatic obstruction. 3. Metastatic level 1 and level 2 left axillary adenopathy. 4. No evidence of malignancy on the right.   09/11/2020 - 11/03/2021 Chemotherapy   Patient is on Treatment Plan : BREAST ADO-Trastuzumab Emtansine  (Kadcyla) q21d     09/11/2020 -  Chemotherapy   Patient is on Treatment Plan : BREAST ADO-Trastuzumab Emtansine (Kadcyla) q21d     11/30/2020 Imaging   CT Chest w/o contrast  IMPRESSION: 1. Interval response to therapy. The left breast mass has mildly decreased in size in the interval. 2. Decrease in size of left axillary and left retropectoral lymph nodes. 3. Small right lung nodules stable to improved. 4. Coronary artery calcifications noted. 5. Aortic Atherosclerosis (ICD10-I70.0).   03/04/2021 PET scan   IMPRESSION: 1. Overall marked improvement compared to the prior PET-CT, with resolution of hypermetabolic activity and pathologic enlargement of the previously involved lymph nodes; lack of hypermetabolic activity and nonvisualization of prior right-sided lung nodules, and substantial reduction in size and activity in the left breast primary. 2. New ground-glass opacity medially in the right lower lobe with low-grade metabolic activity, highly likely to be inflammatory (alveolitis) given the morphology and appearance. 3. Other imaging findings of potential clinical significance: Aortic Atherosclerosis (ICD10-I70.0). Coronary atherosclerosis. Mitral valve calcifications. Punctate nonobstructive left renal calculi. Lower lumbar impingement due to chronic spondylosis and degenerative disc disease. Remote left basal ganglia infarct.   06/07/2021 Imaging   EXAM: CT CHEST, ABDOMEN AND PELVIS WITHOUT CONTRAST  IMPRESSION: 1. Scattered tiny bilateral pulmonary nodules are similar to the prior exams, favored to be benign. 2. Otherwise, no evidence of metastatic disease within the chest, abdomen, or pelvis. 3. Right lower lobe ground-glass nodule  has resolved since the prior PET 4.  Possible constipation. 5. Coronary artery atherosclerosis. Aortic Atherosclerosis (ICD10-I70.0). Emphysema (ICD10-J43.9). 6. Left nephrolithiasis.   06/15/2022 Imaging    IMPRESSION: 1. Mild increase in  size of bilateral pulmonary nodules compatible with progression of metastatic disease. 2. No evidence for metastatic disease within the abdomen or pelvis. 3. Large volume of desiccated stool identified within the rectum. Correlate for any clinical signs or symptoms of constipation. 4. Nonobstructing left renal calculus. 5. Aortic Atherosclerosis (ICD10-I70.0).        INTERVAL HISTORY:  Christy [name] is here for a follow up of metastatic breast cancer. She was last seen by NP Lacie on 07/25/2022. She presents to the clinic alone. Pt report of starting the Tucatinib and she takes one tablet a day twice a day and she is tolerating it very well.        All other systems were reviewed with the patient and are negative.  MEDICAL HISTORY:  Past Medical History:  Diagnosis Date   Diabetes mellitus without complication (HCC)    Hyperlipidemia    Hypertension     SURGICAL HISTORY: Past Surgical History:  Procedure Laterality Date   FOOT FRACTURE SURGERY Right    IR IMAGING GUIDED PORT INSERTION  09/22/2020   IR RADIOLOGIST EVAL & MGMT  09/25/2020    I have reviewed the social history and family history with the patient and they are unchanged from previous note.  ALLERGIES:  has No Known Allergies.  MEDICATIONS:  Current Outpatient Medications  Medication Sig Dispense Refill   aspirin EC 325 MG tablet Take 1 tablet (325 mg total) by mouth daily. 60 tablet 3   atorvastatin (LIPITOR) 20 MG tablet Take 1 tablet (20 mg total) by mouth daily. 60 tablet 3   ferrous sulfate 325 (65 FE) MG EC tablet Take 1 tablet (325 mg total) by mouth daily with breakfast. 30 tablet 3   lidocaine-prilocaine (EMLA) cream Apply 1 application topically as needed. 30 g 2   metFORMIN (GLUCOPHAGE) 500 MG tablet Take 1 tablet (500 mg total) by mouth 2 (two) times daily with a meal. 60 tablet 1   olmesartan (BENICAR) 40 MG tablet      potassium chloride SA (KLOR-CON M) 20 MEQ tablet Take 1 tablet (20 mEq  total) by mouth daily. 30 tablet 0   tucatinib (TUKYSA) 50 MG tablet Take 1 tablet (50 mg total) by mouth 2 (two) times daily. 60 tablet 0   No current facility-administered medications for this visit.    PHYSICAL EXAMINATION: ECOG PERFORMANCE STATUS: 1 - Symptomatic but completely ambulatory  Vitals:   08/03/22 0829  BP: (!) 155/67  Pulse: 74  Resp: 18  Temp: 97.8 F (36.6 C)  SpO2: 100%   Wt Readings from Last 3 Encounters:  08/03/22 151 lb 3.2 oz (68.6 kg)  07/25/22 147 lb 1.6 oz (66.7 kg)  06/22/22 152 lb (68.9 kg)     GENERAL:alert, no distress and comfortable SKIN: skin color normal, no rashes or significant lesions EYES: normal, Conjunctiva are pink and non-injected, sclera clear  NEURO: alert & oriented x 3 with fluent speech   LABORATORY DATA:  I have reviewed the data as listed    Latest Ref Rng & Units 08/03/2022    8:11 AM 07/25/2022    8:13 AM 06/22/2022    8:16 AM  CBC  WBC 4.0 - 10.5 K/uL 5.0  6.3  4.8   Hemoglobin 12.0 - 15.0 g/dL 16.1  09.6  10.0   Hematocrit 36.0 - 46.0 % 30.0  31.5  29.5   Platelets 150 - 400 K/uL 130  143  143         Latest Ref Rng & Units 08/03/2022    8:11 AM 07/25/2022    8:13 AM 06/22/2022    8:16 AM  CMP  Glucose 70 - 99 mg/dL 161  97  096   BUN 8 - 23 mg/dL 25  25  17    Creatinine 0.44 - 1.00 mg/dL 0.45  4.09  8.11   Sodium 135 - 145 mmol/L 143  142  143   Potassium 3.5 - 5.1 mmol/L 3.4  3.3  3.7   Chloride 98 - 111 mmol/L 109  107  112   CO2 22 - 32 mmol/L 27  26  24    Calcium 8.9 - 10.3 mg/dL 91.4  78.2  95.6   Total Protein 6.5 - 8.1 g/dL 6.9  7.0  6.9   Total Bilirubin 0.3 - 1.2 mg/dL 0.9  0.9  0.8   Alkaline Phos 38 - 126 U/L 82  89  98   AST 15 - 41 U/L 47  51  51   ALT 0 - 44 U/L 22  25  25        RADIOGRAPHIC STUDIES: I have personally reviewed the radiological images as listed and agreed with the findings in the report. No results found.    No orders of the defined types were placed in this  encounter.  All questions were answered. The patient knows to call the clinic with any problems, questions or concerns. No barriers to learning was detected. The total time spent in the appointment was 25 minutes.     Malachy Mood, MD 08/03/2022   Carolin Coy, CMA, am acting as scribe for Malachy Mood, MD.   I have reviewed the above documentation for accuracy and completeness, and I agree with the above.

## 2022-08-03 ENCOUNTER — Inpatient Hospital Stay: Payer: Medicare HMO

## 2022-08-03 ENCOUNTER — Encounter: Payer: Self-pay | Admitting: Hematology

## 2022-08-03 ENCOUNTER — Inpatient Hospital Stay (HOSPITAL_BASED_OUTPATIENT_CLINIC_OR_DEPARTMENT_OTHER): Payer: Medicare HMO | Admitting: Hematology

## 2022-08-03 VITALS — BP 155/67 | HR 74 | Temp 97.8°F | Resp 18 | Ht 64.0 in | Wt 151.2 lb

## 2022-08-03 DIAGNOSIS — Z171 Estrogen receptor negative status [ER-]: Secondary | ICD-10-CM

## 2022-08-03 DIAGNOSIS — Z95828 Presence of other vascular implants and grafts: Secondary | ICD-10-CM

## 2022-08-03 DIAGNOSIS — C50412 Malignant neoplasm of upper-outer quadrant of left female breast: Secondary | ICD-10-CM

## 2022-08-03 LAB — CBC WITH DIFFERENTIAL (CANCER CENTER ONLY)
Abs Immature Granulocytes: 0.01 10*3/uL (ref 0.00–0.07)
Basophils Absolute: 0 10*3/uL (ref 0.0–0.1)
Basophils Relative: 0 %
Eosinophils Absolute: 0.3 10*3/uL (ref 0.0–0.5)
Eosinophils Relative: 6 %
HCT: 30 % — ABNORMAL LOW (ref 36.0–46.0)
Hemoglobin: 10.2 g/dL — ABNORMAL LOW (ref 12.0–15.0)
Immature Granulocytes: 0 %
Lymphocytes Relative: 40 %
Lymphs Abs: 2 10*3/uL (ref 0.7–4.0)
MCH: 26 pg (ref 26.0–34.0)
MCHC: 34 g/dL (ref 30.0–36.0)
MCV: 76.5 fL — ABNORMAL LOW (ref 80.0–100.0)
Monocytes Absolute: 0.5 10*3/uL (ref 0.1–1.0)
Monocytes Relative: 9 %
Neutro Abs: 2.2 10*3/uL (ref 1.7–7.7)
Neutrophils Relative %: 45 %
Platelet Count: 130 10*3/uL — ABNORMAL LOW (ref 150–400)
RBC: 3.92 MIL/uL (ref 3.87–5.11)
RDW: 17.9 % — ABNORMAL HIGH (ref 11.5–15.5)
WBC Count: 5 10*3/uL (ref 4.0–10.5)
nRBC: 0 % (ref 0.0–0.2)

## 2022-08-03 LAB — CMP (CANCER CENTER ONLY)
ALT: 22 U/L (ref 0–44)
AST: 47 U/L — ABNORMAL HIGH (ref 15–41)
Albumin: 3.5 g/dL (ref 3.5–5.0)
Alkaline Phosphatase: 82 U/L (ref 38–126)
Anion gap: 7 (ref 5–15)
BUN: 25 mg/dL — ABNORMAL HIGH (ref 8–23)
CO2: 27 mmol/L (ref 22–32)
Calcium: 10.3 mg/dL (ref 8.9–10.3)
Chloride: 109 mmol/L (ref 98–111)
Creatinine: 2.08 mg/dL — ABNORMAL HIGH (ref 0.44–1.00)
GFR, Estimated: 23 mL/min — ABNORMAL LOW (ref >60)
Glucose, Bld: 105 mg/dL — ABNORMAL HIGH (ref 70–99)
Potassium: 3.4 mmol/L — ABNORMAL LOW (ref 3.5–5.1)
Sodium: 143 mmol/L (ref 135–145)
Total Bilirubin: 0.9 mg/dL (ref 0.3–1.2)
Total Protein: 6.9 g/dL (ref 6.5–8.1)

## 2022-08-03 MED ORDER — SODIUM CHLORIDE 0.9% FLUSH
10.0000 mL | Freq: Once | INTRAVENOUS | Status: AC
  Administered 2022-08-03: 10 mL

## 2022-08-03 MED ORDER — HEPARIN SOD (PORK) LOCK FLUSH 100 UNIT/ML IV SOLN
500.0000 [IU] | Freq: Once | INTRAVENOUS | Status: AC
  Administered 2022-08-03: 500 [IU]

## 2022-08-14 NOTE — Assessment & Plan Note (Signed)
[id number] stage IV, grade 2-3, ER-/PR- HER2+, with nodal and pulmonary metastases -She initially self palpated a left breast mass around 06/2020. Work-up showed 2 masses in the left breast UOQ and 3 abnormal lymph nodes. 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 -PET scan 08/28/20 showed: hypermetabolism to left breast and axilla, retropectoral and supraclavicular adenopathy, and several 3-6 mm pulmonary nodules. -She started first-line Kadcyla on 09/11/20. She tolerates well with some fatigue, dose decreased with C4.  -She has had good clinical response, breast mass was no longer palpable on physical exam 06/09/21.  -last PET scan in 01/2022 showed two small lung nodule (5-21mm), slightly bigger since last scan, no other hypermetabolic lesions.   -Her restaging CT scan from June 17, 2022 showed multiple enlarging lung nodules, largest measuring 1 cm, previously 0.6 cm.  This is highly concerning for pulmonary metastasis with slight disease progression. No other new lesions -she tried tucatinib 150mg  twice daily but did not tolerate with excessive fatigue and drowsiness -she restarted Tucatinib at 50mg  bid last week , she is tolerating well -I increased Tucatinib to 100mg  bid on 5/29

## 2022-08-15 ENCOUNTER — Other Ambulatory Visit: Payer: Self-pay

## 2022-08-15 ENCOUNTER — Inpatient Hospital Stay (HOSPITAL_BASED_OUTPATIENT_CLINIC_OR_DEPARTMENT_OTHER): Payer: Medicare HMO | Admitting: Hematology

## 2022-08-15 ENCOUNTER — Inpatient Hospital Stay: Payer: Medicare HMO | Attending: Nurse Practitioner

## 2022-08-15 ENCOUNTER — Inpatient Hospital Stay: Payer: Medicare HMO

## 2022-08-15 VITALS — BP 175/73 | HR 75 | Temp 97.8°F | Resp 18 | Ht 64.0 in | Wt 150.9 lb

## 2022-08-15 DIAGNOSIS — I700 Atherosclerosis of aorta: Secondary | ICD-10-CM | POA: Insufficient documentation

## 2022-08-15 DIAGNOSIS — N189 Chronic kidney disease, unspecified: Secondary | ICD-10-CM | POA: Insufficient documentation

## 2022-08-15 DIAGNOSIS — I10 Essential (primary) hypertension: Secondary | ICD-10-CM | POA: Insufficient documentation

## 2022-08-15 DIAGNOSIS — Z8673 Personal history of transient ischemic attack (TIA), and cerebral infarction without residual deficits: Secondary | ICD-10-CM | POA: Diagnosis not present

## 2022-08-15 DIAGNOSIS — E785 Hyperlipidemia, unspecified: Secondary | ICD-10-CM | POA: Diagnosis not present

## 2022-08-15 DIAGNOSIS — I2510 Atherosclerotic heart disease of native coronary artery without angina pectoris: Secondary | ICD-10-CM | POA: Diagnosis not present

## 2022-08-15 DIAGNOSIS — Z5112 Encounter for antineoplastic immunotherapy: Secondary | ICD-10-CM | POA: Diagnosis not present

## 2022-08-15 DIAGNOSIS — E119 Type 2 diabetes mellitus without complications: Secondary | ICD-10-CM | POA: Insufficient documentation

## 2022-08-15 DIAGNOSIS — R7989 Other specified abnormal findings of blood chemistry: Secondary | ICD-10-CM | POA: Insufficient documentation

## 2022-08-15 DIAGNOSIS — Z95828 Presence of other vascular implants and grafts: Secondary | ICD-10-CM

## 2022-08-15 DIAGNOSIS — Z171 Estrogen receptor negative status [ER-]: Secondary | ICD-10-CM | POA: Insufficient documentation

## 2022-08-15 DIAGNOSIS — Z7982 Long term (current) use of aspirin: Secondary | ICD-10-CM | POA: Insufficient documentation

## 2022-08-15 DIAGNOSIS — R296 Repeated falls: Secondary | ICD-10-CM | POA: Diagnosis not present

## 2022-08-15 DIAGNOSIS — C50412 Malignant neoplasm of upper-outer quadrant of left female breast: Secondary | ICD-10-CM

## 2022-08-15 DIAGNOSIS — R2681 Unsteadiness on feet: Secondary | ICD-10-CM | POA: Insufficient documentation

## 2022-08-15 DIAGNOSIS — C778 Secondary and unspecified malignant neoplasm of lymph nodes of multiple regions: Secondary | ICD-10-CM | POA: Diagnosis not present

## 2022-08-15 DIAGNOSIS — Z79899 Other long term (current) drug therapy: Secondary | ICD-10-CM | POA: Diagnosis not present

## 2022-08-15 DIAGNOSIS — J439 Emphysema, unspecified: Secondary | ICD-10-CM | POA: Insufficient documentation

## 2022-08-15 DIAGNOSIS — Z87891 Personal history of nicotine dependence: Secondary | ICD-10-CM | POA: Diagnosis not present

## 2022-08-15 DIAGNOSIS — C7800 Secondary malignant neoplasm of unspecified lung: Secondary | ICD-10-CM | POA: Insufficient documentation

## 2022-08-15 DIAGNOSIS — R5383 Other fatigue: Secondary | ICD-10-CM | POA: Insufficient documentation

## 2022-08-15 DIAGNOSIS — I071 Rheumatic tricuspid insufficiency: Secondary | ICD-10-CM | POA: Insufficient documentation

## 2022-08-15 DIAGNOSIS — N179 Acute kidney failure, unspecified: Secondary | ICD-10-CM | POA: Diagnosis not present

## 2022-08-15 DIAGNOSIS — N200 Calculus of kidney: Secondary | ICD-10-CM | POA: Insufficient documentation

## 2022-08-15 DIAGNOSIS — K921 Melena: Secondary | ICD-10-CM | POA: Insufficient documentation

## 2022-08-15 LAB — CBC WITH DIFFERENTIAL (CANCER CENTER ONLY)
Abs Immature Granulocytes: 0.01 10*3/uL (ref 0.00–0.07)
Basophils Absolute: 0 10*3/uL (ref 0.0–0.1)
Basophils Relative: 1 %
Eosinophils Absolute: 0.3 10*3/uL (ref 0.0–0.5)
Eosinophils Relative: 7 %
HCT: 28.6 % — ABNORMAL LOW (ref 36.0–46.0)
Hemoglobin: 9.6 g/dL — ABNORMAL LOW (ref 12.0–15.0)
Immature Granulocytes: 0 %
Lymphocytes Relative: 41 %
Lymphs Abs: 2 10*3/uL (ref 0.7–4.0)
MCH: 25.9 pg — ABNORMAL LOW (ref 26.0–34.0)
MCHC: 33.6 g/dL (ref 30.0–36.0)
MCV: 77.1 fL — ABNORMAL LOW (ref 80.0–100.0)
Monocytes Absolute: 0.4 10*3/uL (ref 0.1–1.0)
Monocytes Relative: 9 %
Neutro Abs: 2.1 10*3/uL (ref 1.7–7.7)
Neutrophils Relative %: 42 %
Platelet Count: 137 10*3/uL — ABNORMAL LOW (ref 150–400)
RBC: 3.71 MIL/uL — ABNORMAL LOW (ref 3.87–5.11)
RDW: 17.8 % — ABNORMAL HIGH (ref 11.5–15.5)
WBC Count: 4.9 10*3/uL (ref 4.0–10.5)
nRBC: 0 % (ref 0.0–0.2)

## 2022-08-15 LAB — CMP (CANCER CENTER ONLY)
ALT: 31 U/L (ref 0–44)
AST: 65 U/L — ABNORMAL HIGH (ref 15–41)
Albumin: 3.4 g/dL — ABNORMAL LOW (ref 3.5–5.0)
Alkaline Phosphatase: 99 U/L (ref 38–126)
Anion gap: 6 (ref 5–15)
BUN: 24 mg/dL — ABNORMAL HIGH (ref 8–23)
CO2: 27 mmol/L (ref 22–32)
Calcium: 10.2 mg/dL (ref 8.9–10.3)
Chloride: 109 mmol/L (ref 98–111)
Creatinine: 2.67 mg/dL — ABNORMAL HIGH (ref 0.44–1.00)
GFR, Estimated: 17 mL/min — ABNORMAL LOW (ref >60)
Glucose, Bld: 100 mg/dL — ABNORMAL HIGH (ref 70–99)
Potassium: 3.5 mmol/L (ref 3.5–5.1)
Sodium: 142 mmol/L (ref 135–145)
Total Bilirubin: 0.8 mg/dL (ref 0.3–1.2)
Total Protein: 6.7 g/dL (ref 6.5–8.1)

## 2022-08-15 MED ORDER — SODIUM CHLORIDE 0.9% FLUSH
10.0000 mL | Freq: Once | INTRAVENOUS | Status: AC
  Administered 2022-08-15: 10 mL via INTRAVENOUS

## 2022-08-15 MED ORDER — SODIUM CHLORIDE 0.9% FLUSH
10.0000 mL | Freq: Once | INTRAVENOUS | Status: AC
  Administered 2022-08-15: 10 mL

## 2022-08-15 MED ORDER — SODIUM CHLORIDE 0.9 % IV SOLN: INTRAVENOUS | Status: DC

## 2022-08-15 MED ORDER — HEPARIN SOD (PORK) LOCK FLUSH 100 UNIT/ML IV SOLN
500.0000 [IU] | Freq: Once | INTRAVENOUS | Status: AC
  Administered 2022-08-15: 500 [IU] via INTRAVENOUS

## 2022-08-15 NOTE — Progress Notes (Signed)
Per Dr Mosetta Putt, d/t Scr of 2.67 and outdated echo, Kadcycla held, will resume next week. Today 1L of NS for supportive care.

## 2022-08-15 NOTE — Progress Notes (Signed)
[facility]   Telephone:([phone] Fax:([phone]) [phone]   Clinic Follow up Note   Patient Care Team: [name], MD as PCP - General (Internal Medicine) Malachy Mood, MD as Consulting Physician (Medical Oncology)  Date of Service:  08/15/2022  CHIEF COMPLAINT: f/u of metastatic breast cancer    CURRENT THERAPY:  Kadcyla q3 weeks starting 09/2020, add Tucatinib 150 mg BID for first month    ASSESSMENT:  Christy [name] is a 82 y.o. 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 Work-up showed 2 masses in the left breast UOQ and 3 abnormal lymph nodes. 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 -PET scan 08/28/20 showed: hypermetabolism to left breast and axilla, retropectoral and supraclavicular adenopathy, and several 3-6 mm pulmonary nodules. -She started first-line Kadcyla on 09/11/20. She tolerates well with some fatigue, dose decreased with C4.  -She has had good clinical response, breast mass was no longer palpable on physical exam 06/09/21.  -last PET scan in 01/2022 showed two small lung nodule (5-83mm), slightly bigger since last scan, no other hypermetabolic lesions.   -Her restaging CT scan from June 17, 2022 showed multiple enlarging lung nodules, largest measuring 1 cm, previously 0.6 cm.  This is highly concerning for pulmonary metastasis with slight disease progression. No other new lesions -she tried tucatinib 150mg  twice daily but did not tolerate with excessive fatigue and drowsiness -she restarted Tucatinib at 50mg  bid last week , she is tolerating well -I increased Tucatinib to 100mg  bid on 5/29   -She developed unsteady gait, likely from fatigue.  Lab reviewed, creatinine has gone up from 2.082 weeks ago to 2.67 today, will hold on kidney started today, postponed  to next week. -I do not think she can tolerated Tucatinib at 100 mg twice daily, will stop for the next week, if she recovers well, will restart at 50 mg twice daily  Acute on chronic kidney disease -Her baseline creatinine is around 1.6, it has increased to 2.67 today -Will give IV fluids today, and stop taking every 4-week.  History of stroke, multiple falls -She is at very high risk for fall, due to her previous stroke, deconditioning from cancer and related treatment -she uses walker  -She also reports black stool lately, will hold on aspirin 325 mg daily -pt declined PT, she had before   PLAN: -lab reviewed -post pone her treatment today and give IV hydration -hold  ITucatnib for next week -lab, f/u and Kadcyla in a week   SUMMARY OF ONCOLOGIC HISTORY: Oncology History Overview Note  Cancer Staging Malignant neoplasm of upper-outer quadrant of female breast (HCC) Staging form: Breast, AJCC 8th Edition - Clinical stage from 07/31/2020: Stage IV (cT2, cN3, cM1, G2, ER-, PR-, HER2+) - Signed by Pollyann Samples, NP on 08/31/2020 Stage prefix: Initial diagnosis Nuclear grade: G2 Histologic grading system: 3 grade system    Malignant neoplasm of upper-outer quadrant of female breast (HCC)  07/24/2020 Breast US   On physical exam, a very firm mass is identified at the 1:30 position of the LEFT breast 5 cm from the nipple.   IMPRESSION: 1. Highly suspicious 4.3 cm mass at the 1:30 position of the LEFT breast and highly suspicious 2.7 cm mass at the 2 o'clock position of the LEFT breast, with the 2 masses encompassing an area  measuring at least 6.1 cm. 2. 3 abnormal LEFT axillary lymph nodes with cortical thickening, tissue sampling of 1 of these lymph nodes recommended. 3. Anterior LEFT breast skin thickening nonspecific but may reflect dermal involvement. 4. No mammographic evidence of RIGHT breast malignancy.   07/31/2020 Initial Biopsy   Diagnosis 1. Breast, left, needle  core biopsy, 1 :30 pm, 5cmfn - INVASIVE DUCTAL CARCINOMA, SEE COMMENT. 2. Breast, left, needle core biopsy, 2 o'clock, 8cmfn - INVASIVE DUCTAL CARCINOMA, SEE COMMENT. - DUCTAL CARCINOMA IN SITU. 3. Lymph node, needle/core biopsy, left axillary - METASTATIC CARCINOMA IN A LYMPH NODE. Microscopic Comment 1. and 3. The carcinoma in both parts has a similar morphology and appears grade 2-3. The DCIS in part 2 is high-grade with necrosis. The carcinoma measures 15 mm (part 1) and 14 mm (part 2) in greatest linear extent  2. PROGNOSTIC INDICATORS Results: IMMUNOHISTOCHEMICAL AND MORPHOMETRIC ANALYSIS PERFORMED MANUALLY The tumor cells are POSITIVE for Her2 (3+). Estrogen Receptor: 0%, NEGATIVE Progesterone Receptor: 0%, NEGATIVE Proliferation Marker Ki67: 20%    07/31/2020 Cancer Staging   Staging form: Breast, AJCC 8th Edition - Clinical stage from 07/31/2020: Stage IV (cT2, cN3, cM1, G2, ER-, PR-, HER2+) - Signed by Pollyann Samples, NP on 08/31/2020 Stage prefix: Initial diagnosis Nuclear grade: G2 Histologic grading system: 3 grade system   08/13/2020 Initial Diagnosis   Malignant neoplasm of upper-outer quadrant of female breast (HCC)   08/28/2020 PET scan   IMPRESSION: Signs of LEFT breast cancer with LEFT axillary and retropectoral adenopathy as well as LEFT supraclavicular adenopathy.   Signs of pulmonary metastatic disease.   Mild asymmetric uptake in the LEFT as compared to the RIGHT adrenal gland. Equivocal but suspicious, attention on follow-up.   Aortic atherosclerosis.   Cystic changes and potential bronchiectasis in the RIGHT lung base likely post infectious or inflammatory. Correlate with any respiratory symptoms. Comparison with prior imaging may be helpful with attention on follow-up.   08/28/2020 Imaging   BRAIN MRI IMPRESSION: 1. Subacute lacunar infarct of the left basal ganglia with no malignant hemorrhagic transformation or mass effect. Underlying chronic  bilateral basal ganglia ischemia. 2. No metastatic disease or other acute intracranial abnormality identified.   08/28/2020 Imaging   Breast MRI   IMPRESSION: 1. 8.1 x 8.1 x 5.1 cm area of biopsy-proven malignancy in the central, upper outer and lower outer quadrants of the left breast. 2. Diffuse anterior left breast skin thickening without abnormal enhancement. This may represent thickening due to lymphedema associated with lymphatic obstruction. 3. Metastatic level 1 and level 2 left axillary adenopathy. 4. No evidence of malignancy on the right.   09/11/2020 - 11/03/2021 Chemotherapy   Patient is on Treatment Plan : BREAST ADO-Trastuzumab Emtansine (Kadcyla) q21d     09/11/2020 -  Chemotherapy   Patient is on Treatment Plan : BREAST ADO-Trastuzumab Emtansine (Kadcyla) q21d     11/30/2020 Imaging   CT Chest w/o contrast  IMPRESSION: 1. Interval response to therapy. The left breast mass has mildly decreased in size in the interval. 2. Decrease in size of left axillary and left retropectoral lymph nodes. 3. Small right lung nodules stable to improved. 4. Coronary artery calcifications noted. 5. Aortic Atherosclerosis (ICD10-I70.0).   03/04/2021 PET scan   IMPRESSION: 1. Overall marked improvement compared to the prior PET-CT, with resolution of hypermetabolic activity and pathologic enlargement of the previously involved lymph nodes; lack of hypermetabolic activity and nonvisualization of prior right-sided lung nodules, and substantial reduction in size and activity in  the left breast primary. 2. New ground-glass opacity medially in the right lower lobe with low-grade metabolic activity, highly likely to be inflammatory (alveolitis) given the morphology and appearance. 3. Other imaging findings of potential clinical significance: Aortic Atherosclerosis (ICD10-I70.0). Coronary atherosclerosis. Mitral valve calcifications. Punctate nonobstructive left renal calculi. Lower lumbar  impingement due to chronic spondylosis and degenerative disc disease. Remote left basal ganglia infarct.   06/07/2021 Imaging   EXAM: CT CHEST, ABDOMEN AND PELVIS WITHOUT CONTRAST  IMPRESSION: 1. Scattered tiny bilateral pulmonary nodules are similar to the prior exams, favored to be benign. 2. Otherwise, no evidence of metastatic disease within the chest, abdomen, or pelvis. 3. Right lower lobe ground-glass nodule has resolved since the prior PET 4.  Possible constipation. 5. Coronary artery atherosclerosis. Aortic Atherosclerosis (ICD10-I70.0). Emphysema (ICD10-J43.9). 6. Left nephrolithiasis.   06/15/2022 Imaging    IMPRESSION: 1. Mild increase in size of bilateral pulmonary nodules compatible with progression of metastatic disease. 2. No evidence for metastatic disease within the abdomen or pelvis. 3. Large volume of desiccated stool identified within the rectum. Correlate for any clinical signs or symptoms of constipation. 4. Nonobstructing left renal calculus. 5. Aortic Atherosclerosis (ICD10-I70.0).        INTERVAL HISTORY:  Christy [name] is here for a follow up of metastatic breast cancer. She was last seen by me on 08/03/2022. She presents to the clinic alone. Pt state that she feel like that she is ok and then someday's she is off balance.- Pt sttae that she is getting a brace made for her leg due to her past stoke to help with her balance.      All other systems were reviewed with the patient and are negative.  MEDICAL HISTORY:  Past Medical History:  Diagnosis Date   Diabetes mellitus without complication (HCC)    Hyperlipidemia    Hypertension     SURGICAL HISTORY: Past Surgical History:  Procedure Laterality Date   FOOT FRACTURE SURGERY Right    IR IMAGING GUIDED PORT INSERTION  09/22/2020   IR RADIOLOGIST EVAL & MGMT  09/25/2020    I have reviewed the social history and family history with the patient and they are unchanged from previous  note.  ALLERGIES:  has No Known Allergies.  MEDICATIONS:  Current Outpatient Medications  Medication Sig Dispense Refill   aspirin EC 325 MG tablet Take 1 tablet (325 mg total) by mouth daily. 60 tablet 3   atorvastatin (LIPITOR) 20 MG tablet Take 1 tablet (20 mg total) by mouth daily. 60 tablet 3   ferrous sulfate 325 (65 FE) MG EC tablet Take 1 tablet (325 mg total) by mouth daily with breakfast. 30 tablet 3   lidocaine-prilocaine (EMLA) cream Apply 1 application topically as needed. 30 g 2   metFORMIN (GLUCOPHAGE) 500 MG tablet Take 1 tablet (500 mg total) by mouth 2 (two) times daily with a meal. 60 tablet 1   olmesartan (BENICAR) 40 MG tablet      potassium chloride SA (KLOR-CON M) 20 MEQ tablet Take 1 tablet (20 mEq total) by mouth daily. 30 tablet 0   tucatinib (TUKYSA) 50 MG tablet Take 1 tablet (50 mg total) by mouth 2 (two) times daily. 60 tablet 0   No current facility-administered medications for this visit.   Facility-Administered Medications Ordered in Other Visits  Medication Dose Route Frequency Provider Last Rate Last Admin   0.9 %  sodium chloride infusion   Intravenous Continuous Malachy Mood, MD 500 mL/hr at 08/15/22 1039  New Bag at 08/15/22 1039    PHYSICAL EXAMINATION: ECOG PERFORMANCE STATUS: 2 - Symptomatic, <50% confined to bed  Vitals:   08/15/22 0910  BP: (!) 175/73  Pulse: 75  Resp: 18  Temp: 97.8 F (36.6 C)  SpO2: 100%   Wt Readings from Last 3 Encounters:  08/15/22 150 lb 14.4 oz (68.4 kg)  08/03/22 151 lb 3.2 oz (68.6 kg)  07/25/22 147 lb 1.6 oz (66.7 kg)    GENERAL:alert, no distress and comfortable SKIN: skin color normal, no rashes or significant lesions EYES: normal, Conjunctiva are pink and non-injected, sclera clear  NEURO: alert & oriented x 3 with fluent speech  LABORATORY DATA:  I have reviewed the data as listed    Latest Ref Rng & Units 08/15/2022    8:47 AM 08/03/2022    8:11 AM 07/25/2022    8:13 AM  CBC  WBC 4.0 - 10.5 K/uL  4.9  5.0  6.3   Hemoglobin 12.0 - 15.0 g/dL 9.6  16.1  09.6   Hematocrit 36.0 - 46.0 % 28.6  30.0  31.5   Platelets 150 - 400 K/uL 137  130  143         Latest Ref Rng & Units 08/15/2022    8:47 AM 08/03/2022    8:11 AM 07/25/2022    8:13 AM  CMP  Glucose 70 - 99 mg/dL 045  409  97   BUN 8 - 23 mg/dL 24  25  25    Creatinine 0.44 - 1.00 mg/dL 8.11  9.14  7.82   Sodium 135 - 145 mmol/L 142  143  142   Potassium 3.5 - 5.1 mmol/L 3.5  3.4  3.3   Chloride 98 - 111 mmol/L 109  109  107   CO2 22 - 32 mmol/L 27  27  26    Calcium 8.9 - 10.3 mg/dL 95.6  21.3  08.6   Total Protein 6.5 - 8.1 g/dL 6.7  6.9  7.0   Total Bilirubin 0.3 - 1.2 mg/dL 0.8  0.9  0.9   Alkaline Phos 38 - 126 U/L 99  82  89   AST 15 - 41 U/L 65  47  51   ALT 0 - 44 U/L 31  22  25        RADIOGRAPHIC STUDIES: I have personally reviewed the radiological images as listed and agreed with the findings in the report. No results found.    Orders Placed This Encounter  Procedures   CBC with Differential (Cancer Center Only)    Standing Status:   Future    Standing Expiration Date:   09/14/2023   CMP (Cancer Center only)    Standing Status:   Future    Standing Expiration Date:   09/14/2023   CBC with Differential (Cancer Center Only)    Standing Status:   Future    Standing Expiration Date:   10/05/2023   CMP (Cancer Center only)    Standing Status:   Future    Standing Expiration Date:   10/05/2023   CBC with Differential (Cancer Center Only)    Standing Status:   Future    Standing Expiration Date:   10/26/2023   CMP (Cancer Center only)    Standing Status:   Future    Standing Expiration Date:   10/26/2023   CBC with Differential (Cancer Center Only)    Standing Status:   Future    Standing Expiration Date:   08/22/2023   CMP (  Cancer Center only)    Standing Status:   Future    Standing Expiration Date:   08/22/2023   All questions were answered. The patient knows to call the clinic with any problems, questions  or concerns. No barriers to learning was detected. The total time spent in the appointment was 30 minutes.     Malachy Mood, MD 08/15/2022   Carolin Coy, CMA, am acting as scribe for Malachy Mood, MD.   I have reviewed the above documentation for accuracy and completeness, and I agree with the above.

## 2022-08-16 ENCOUNTER — Other Ambulatory Visit: Payer: Self-pay

## 2022-08-17 ENCOUNTER — Other Ambulatory Visit: Payer: Self-pay

## 2022-08-19 ENCOUNTER — Other Ambulatory Visit: Payer: Self-pay

## 2022-08-21 NOTE — Assessment & Plan Note (Signed)
-  She is at very high risk for fall, due to her previous stroke, deconditioning from cancer and related treatment -she uses walker  -She also reports black stool lately, will hold on aspirin 325 mg daily -pt declined PT, she had before

## 2022-08-21 NOTE — Assessment & Plan Note (Signed)
[id number] stage IV, grade 2-3, ER-/PR- HER2+, with nodal and pulmonary metastases -She initially self palpated a left breast mass around 06/2020. Work-up showed 2 masses in the left breast UOQ and 3 abnormal lymph nodes. 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 -PET scan 08/28/20 showed: hypermetabolism to left breast and axilla, retropectoral and supraclavicular adenopathy, and several 3-6 mm pulmonary nodules. -She started first-line Kadcyla on 09/11/20. She tolerates well with some fatigue, dose decreased with C4.  -She has had good clinical response, breast mass was no longer palpable on physical exam 06/09/21.  -last PET scan in 01/2022 showed two small lung nodule (5-66mm), slightly bigger since last scan, no other hypermetabolic lesions.   -Her restaging CT scan from June 17, 2022 showed multiple enlarging lung nodules, largest measuring 1 cm, previously 0.6 cm.  This is highly concerning for pulmonary metastasis with slight disease progression. No other new lesions -she tried tucatinib 150mg  twice daily but did not tolerate with excessive fatigue and drowsiness -she restarted Tucatinib at 50mg  bid last week , she is tolerating well -I increased Tucatinib to 100mg  bid on 5/29   -She developed unsteady gait, likely from fatigue.  Lab reviewed, creatinine has gone up from 2.082 weeks ago to 2.67 today, will hold on kidney started today, postponed to next week. -I do not think she can tolerated Tucatinib at 100 mg twice daily, will stop for the next week, if she recovers well, will restart at 50 mg twice daily

## 2022-08-21 NOTE — Assessment & Plan Note (Signed)
-  She is at very high risk for fall, due to her previous stroke, deconditioning from cancer and related treatment -she uses walker  -She also reports black stool lately, will hold on aspirin 325 mg daily -pt declined PT, she had before    

## 2022-08-22 ENCOUNTER — Other Ambulatory Visit: Payer: Self-pay

## 2022-08-22 ENCOUNTER — Inpatient Hospital Stay: Payer: Medicare HMO

## 2022-08-22 ENCOUNTER — Inpatient Hospital Stay (HOSPITAL_BASED_OUTPATIENT_CLINIC_OR_DEPARTMENT_OTHER): Payer: Medicare HMO | Admitting: Hematology

## 2022-08-22 ENCOUNTER — Encounter: Payer: Self-pay | Admitting: Hematology

## 2022-08-22 VITALS — BP 158/93 | HR 72 | Temp 97.7°F | Wt 152.9 lb

## 2022-08-22 DIAGNOSIS — Z171 Estrogen receptor negative status [ER-]: Secondary | ICD-10-CM

## 2022-08-22 DIAGNOSIS — R296 Repeated falls: Secondary | ICD-10-CM | POA: Diagnosis not present

## 2022-08-22 DIAGNOSIS — Z95828 Presence of other vascular implants and grafts: Secondary | ICD-10-CM

## 2022-08-22 DIAGNOSIS — C50412 Malignant neoplasm of upper-outer quadrant of left female breast: Secondary | ICD-10-CM | POA: Diagnosis not present

## 2022-08-22 DIAGNOSIS — Z5112 Encounter for antineoplastic immunotherapy: Secondary | ICD-10-CM | POA: Diagnosis not present

## 2022-08-22 LAB — CMP (CANCER CENTER ONLY)
ALT: 33 U/L (ref 0–44)
AST: 58 U/L — ABNORMAL HIGH (ref 15–41)
Albumin: 3 g/dL — ABNORMAL LOW (ref 3.5–5.0)
Alkaline Phosphatase: 104 U/L (ref 38–126)
Anion gap: 5 (ref 5–15)
BUN: 20 mg/dL (ref 8–23)
CO2: 27 mmol/L (ref 22–32)
Calcium: 9.9 mg/dL (ref 8.9–10.3)
Chloride: 111 mmol/L (ref 98–111)
Creatinine: 1.54 mg/dL — ABNORMAL HIGH (ref 0.44–1.00)
GFR, Estimated: 34 mL/min — ABNORMAL LOW
Glucose, Bld: 109 mg/dL — ABNORMAL HIGH (ref 70–99)
Potassium: 3.5 mmol/L (ref 3.5–5.1)
Sodium: 143 mmol/L (ref 135–145)
Total Bilirubin: 0.7 mg/dL (ref 0.3–1.2)
Total Protein: 5.9 g/dL — ABNORMAL LOW (ref 6.5–8.1)

## 2022-08-22 LAB — CBC WITH DIFFERENTIAL (CANCER CENTER ONLY)
Abs Immature Granulocytes: 0.01 K/uL (ref 0.00–0.07)
Basophils Absolute: 0 K/uL (ref 0.0–0.1)
Basophils Relative: 1 %
Eosinophils Absolute: 0.4 K/uL (ref 0.0–0.5)
Eosinophils Relative: 9 %
HCT: 27.1 % — ABNORMAL LOW (ref 36.0–46.0)
Hemoglobin: 9.4 g/dL — ABNORMAL LOW (ref 12.0–15.0)
Immature Granulocytes: 0 %
Lymphocytes Relative: 39 %
Lymphs Abs: 1.8 K/uL (ref 0.7–4.0)
MCH: 26.9 pg (ref 26.0–34.0)
MCHC: 34.7 g/dL (ref 30.0–36.0)
MCV: 77.7 fL — ABNORMAL LOW (ref 80.0–100.0)
Monocytes Absolute: 0.4 K/uL (ref 0.1–1.0)
Monocytes Relative: 8 %
Neutro Abs: 2 K/uL (ref 1.7–7.7)
Neutrophils Relative %: 43 %
Platelet Count: 127 K/uL — ABNORMAL LOW (ref 150–400)
RBC: 3.49 MIL/uL — ABNORMAL LOW (ref 3.87–5.11)
RDW: 18 % — ABNORMAL HIGH (ref 11.5–15.5)
WBC Count: 4.7 K/uL (ref 4.0–10.5)
nRBC: 0 % (ref 0.0–0.2)

## 2022-08-22 MED ORDER — SODIUM CHLORIDE 0.9 % IV SOLN
3.0000 mg/kg | Freq: Once | INTRAVENOUS | Status: AC
  Administered 2022-08-22: 200 mg via INTRAVENOUS
  Filled 2022-08-22: qty 10

## 2022-08-22 MED ORDER — DIPHENHYDRAMINE HCL 25 MG PO CAPS
50.0000 mg | ORAL_CAPSULE | Freq: Once | ORAL | Status: AC
  Administered 2022-08-22: 50 mg via ORAL
  Filled 2022-08-22: qty 2

## 2022-08-22 MED ORDER — HEPARIN SOD (PORK) LOCK FLUSH 100 UNIT/ML IV SOLN
500.0000 [IU] | Freq: Once | INTRAVENOUS | Status: AC | PRN
  Administered 2022-08-22: 500 [IU]

## 2022-08-22 MED ORDER — SODIUM CHLORIDE 0.9% FLUSH
10.0000 mL | INTRAVENOUS | Status: DC | PRN
  Administered 2022-08-22: 10 mL

## 2022-08-22 MED ORDER — ACETAMINOPHEN 325 MG PO TABS
650.0000 mg | ORAL_TABLET | Freq: Once | ORAL | Status: AC
  Administered 2022-08-22: 650 mg via ORAL
  Filled 2022-08-22: qty 2

## 2022-08-22 MED ORDER — SODIUM CHLORIDE 0.9% FLUSH
10.0000 mL | Freq: Once | INTRAVENOUS | Status: AC
  Administered 2022-08-22: 10 mL

## 2022-08-22 MED ORDER — SODIUM CHLORIDE 0.9 % IV SOLN: Freq: Once | INTRAVENOUS | Status: AC

## 2022-08-22 NOTE — Progress Notes (Signed)
Per Dr. Mosetta Putt, ok to treat today with last ECHO from 04/27/2022.  Renaee Munda, PharmD PGY-2 Pharmacy Resident Hematology/Oncology [phone]  08/22/2022 2:25 PM

## 2022-08-22 NOTE — Patient Instructions (Signed)
[facility]  Discharge Instructions: Thank you for choosing [facility] to provide your oncology and hematology care.   If you have a lab appointment with the Cancer Center, please go directly to the Cancer Center and check in at the registration area.   Wear comfortable clothing and clothing appropriate for easy access to any Portacath or PICC line.   We strive to give you quality time with your provider. You may need to reschedule your appointment if you arrive late (15 or more minutes).  Arriving late affects you and other patients whose appointments are after yours.  Also, if you miss three or more appointments without notifying the office, you may be dismissed from the clinic at the provider's discretion.      For prescription refill requests, have your pharmacy contact our office and allow 72 hours for refills to be completed.    Today you received the following chemotherapy and/or immunotherapy agents kadcyla      To help prevent nausea and vomiting after your treatment, we encourage you to take your nausea medication as directed.  BELOW ARE SYMPTOMS THAT SHOULD BE REPORTED IMMEDIATELY: *FEVER GREATER THAN 100.4 F (38 C) OR HIGHER *CHILLS OR SWEATING *NAUSEA AND VOMITING THAT IS NOT CONTROLLED WITH YOUR NAUSEA MEDICATION *UNUSUAL SHORTNESS OF BREATH *UNUSUAL BRUISING OR BLEEDING *URINARY PROBLEMS (pain or burning when urinating, or frequent urination) *BOWEL PROBLEMS (unusual diarrhea, constipation, pain near the anus) TENDERNESS IN MOUTH AND THROAT WITH OR WITHOUT PRESENCE OF ULCERS (sore throat, sores in mouth, or a toothache) UNUSUAL RASH, SWELLING OR PAIN  UNUSUAL VAGINAL DISCHARGE OR ITCHING   Items with * indicate a potential emergency and should be followed up as soon as possible or go to the Emergency Department if any problems should occur.  Please show the CHEMOTHERAPY ALERT CARD or IMMUNOTHERAPY ALERT CARD at check-in  to the Emergency Department and triage nurse.  Should you have questions after your visit or need to cancel or reschedule your appointment, please contact [facility]  Dept: [phone]  and follow the prompts.  Office hours are 8:00 a.m. to 4:30 p.m. Monday - Friday. Please note that voicemails left after 4:00 p.m. may not be returned until the following business day.  We are closed weekends and major holidays. You have access to a nurse at all times for urgent questions. Please call the main number to the clinic Dept: [phone] and follow the prompts.   For any non-urgent questions, you may also contact your provider using MyChart. We now offer e-Visits for anyone 18 and older to request care online for non-urgent symptoms. For details visit [email or web address].   Also download the MyChart app! Go to the app store, search "MyChart", open the app, select , and log in with your MyChart username and password.   

## 2022-08-22 NOTE — Progress Notes (Signed)
[facility]   [phone] [phone]   Clinic Follow up Note   Patient Care Team: [name], MD as PCP - General (Internal Medicine) Malachy Mood, MD as Consulting Physician (Medical Oncology)  Date of Service:  08/22/2022  CHIEF COMPLAINT: f/u of metastatic breast cancer   CURRENT THERAPY:   Kadcyla q3 weeks starting 09/2020, add Tucatinib 150 mg BID for first month   ASSESSMENT:  Christy [name] is a 82 y.o. 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 Work-up showed 2 masses in the left breast UOQ and 3 abnormal lymph nodes. 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 -PET scan 08/28/20 showed: hypermetabolism to left breast and axilla, retropectoral and supraclavicular adenopathy, and several 3-6 mm pulmonary nodules. -She started first-line Kadcyla on 09/11/20. She tolerates well with some fatigue, dose decreased with C4.  -She has had good clinical response, breast mass was no longer palpable on physical exam 06/09/21.  -last PET scan in 01/2022 showed two small lung nodule (5-30mm), slightly bigger since last scan, no other hypermetabolic lesions.   -Her restaging CT scan from June 17, 2022 showed multiple enlarging lung nodules, largest measuring 1 cm, previously 0.6 cm.  This is highly concerning for pulmonary metastasis with slight disease progression. No other new lesions -she tried tucatinib 150mg  twice daily but did not tolerate with excessive fatigue and drowsiness -she restarted Tucatinib at 50mg  bid last week , she is tolerating well -I increased Tucatinib to 100mg  bid on 5/29  -She did not tolerate well, with more fatigue, and unstable gait.  She also developed worsening renal function, I stopped Tucatinib on 08/15/2022 -She is clinically stable,  renal function has returned to her baseline, but no improvement in her fatigue and gait.  Will continue holding tucatinib now and restart Kadcyla today  Stroke (cerebrum) (HCC) -She is at very high risk for fall, due to her previous stroke, deconditioning from cancer and related treatment -she uses walker  -She also reports black stool lately, will hold on aspirin 325 mg daily -pt declined PT, she had before     Multiple falls -She is at very high risk for fall, due to her previous stroke, deconditioning from cancer and related treatment -she uses walker  -She also reports black stool lately, will hold on aspirin 325 mg daily -pt declined PT, she had before      PLAN: -lab reviewed -proceed with Kadcyla today -lab/flush and f/u in 7/10 -continue to hold Tucatinib (I took her bottle, since she can ot remember things well)   SUMMARY OF ONCOLOGIC HISTORY: Oncology History Overview Note  Cancer Staging Malignant neoplasm of upper-outer quadrant of female breast (HCC) Staging form: Breast, AJCC 8th Edition - Clinical stage from 07/31/2020: Stage IV (cT2, cN3, cM1, G2, ER-, PR-, HER2+) - Signed by Pollyann Samples, NP on 08/31/2020 Stage prefix: Initial diagnosis Nuclear grade: G2 Histologic grading system: 3 grade system    Malignant neoplasm of upper-outer quadrant of female breast (HCC)  07/24/2020 Breast US   On physical exam, a very firm mass is identified at the 1:30 position of the LEFT breast 5 cm from the nipple.   IMPRESSION: 1. Highly suspicious 4.3 cm mass at the 1:30 position of the LEFT breast and highly suspicious 2.7 cm mass at the 2 o'clock  position of the LEFT breast, with the 2 masses encompassing an area measuring at least 6.1 cm. 2. 3 abnormal LEFT axillary lymph nodes with cortical thickening, tissue sampling of 1 of these lymph nodes recommended. 3. Anterior LEFT breast skin thickening nonspecific but may reflect dermal involvement. 4. No mammographic  evidence of RIGHT breast malignancy.   07/31/2020 Initial Biopsy   Diagnosis 1. Breast, left, needle core biopsy, 1 :30 pm, 5cmfn - INVASIVE DUCTAL CARCINOMA, SEE COMMENT. 2. Breast, left, needle core biopsy, 2 o'clock, 8cmfn - INVASIVE DUCTAL CARCINOMA, SEE COMMENT. - DUCTAL CARCINOMA IN SITU. 3. Lymph node, needle/core biopsy, left axillary - METASTATIC CARCINOMA IN A LYMPH NODE. Microscopic Comment 1. and 3. The carcinoma in both parts has a similar morphology and appears grade 2-3. The DCIS in part 2 is high-grade with necrosis. The carcinoma measures 15 mm (part 1) and 14 mm (part 2) in greatest linear extent  2. PROGNOSTIC INDICATORS Results: IMMUNOHISTOCHEMICAL AND MORPHOMETRIC ANALYSIS PERFORMED MANUALLY The tumor cells are POSITIVE for Her2 (3+). Estrogen Receptor: 0%, NEGATIVE Progesterone Receptor: 0%, NEGATIVE Proliferation Marker Ki67: 20%    07/31/2020 Cancer Staging   Staging form: Breast, AJCC 8th Edition - Clinical stage from 07/31/2020: Stage IV (cT2, cN3, cM1, G2, ER-, PR-, HER2+) - Signed by Pollyann Samples, NP on 08/31/2020 Stage prefix: Initial diagnosis Nuclear grade: G2 Histologic grading system: 3 grade system   08/13/2020 Initial Diagnosis   Malignant neoplasm of upper-outer quadrant of female breast (HCC)   08/28/2020 PET scan   IMPRESSION: Signs of LEFT breast cancer with LEFT axillary and retropectoral adenopathy as well as LEFT supraclavicular adenopathy.   Signs of pulmonary metastatic disease.   Mild asymmetric uptake in the LEFT as compared to the RIGHT adrenal gland. Equivocal but suspicious, attention on follow-up.   Aortic atherosclerosis.   Cystic changes and potential bronchiectasis in the RIGHT lung base likely post infectious or inflammatory. Correlate with any respiratory symptoms. Comparison with prior imaging may be helpful with attention on follow-up.   08/28/2020 Imaging   BRAIN MRI IMPRESSION: 1. Subacute lacunar infarct of the  left basal ganglia with no malignant hemorrhagic transformation or mass effect. Underlying chronic bilateral basal ganglia ischemia. 2. No metastatic disease or other acute intracranial abnormality identified.   08/28/2020 Imaging   Breast MRI   IMPRESSION: 1. 8.1 x 8.1 x 5.1 cm area of biopsy-proven malignancy in the central, upper outer and lower outer quadrants of the left breast. 2. Diffuse anterior left breast skin thickening without abnormal enhancement. This may represent thickening due to lymphedema associated with lymphatic obstruction. 3. Metastatic level 1 and level 2 left axillary adenopathy. 4. No evidence of malignancy on the right.   09/11/2020 - 11/03/2021 Chemotherapy   Patient is on Treatment Plan : BREAST ADO-Trastuzumab Emtansine (Kadcyla) q21d     09/11/2020 -  Chemotherapy   Patient is on Treatment Plan : BREAST ADO-Trastuzumab Emtansine (Kadcyla) q21d     11/30/2020 Imaging   CT Chest w/o contrast  IMPRESSION: 1. Interval response to therapy. The left breast mass has mildly decreased in size in the interval. 2. Decrease in size of left axillary and left retropectoral lymph nodes. 3. Small right lung nodules stable to improved. 4. Coronary artery calcifications noted. 5. Aortic Atherosclerosis (ICD10-I70.0).   03/04/2021 PET scan   IMPRESSION: 1. Overall marked improvement compared to the prior PET-CT, with resolution of hypermetabolic activity and pathologic enlargement of the previously involved lymph nodes; lack of hypermetabolic activity and nonvisualization of  prior right-sided lung nodules, and substantial reduction in size and activity in the left breast primary. 2. New ground-glass opacity medially in the right lower lobe with low-grade metabolic activity, highly likely to be inflammatory (alveolitis) given the morphology and appearance. 3. Other imaging findings of potential clinical significance: Aortic Atherosclerosis (ICD10-I70.0). Coronary  atherosclerosis. Mitral valve calcifications. Punctate nonobstructive left renal calculi. Lower lumbar impingement due to chronic spondylosis and degenerative disc disease. Remote left basal ganglia infarct.   06/07/2021 Imaging   EXAM: CT CHEST, ABDOMEN AND PELVIS WITHOUT CONTRAST  IMPRESSION: 1. Scattered tiny bilateral pulmonary nodules are similar to the prior exams, favored to be benign. 2. Otherwise, no evidence of metastatic disease within the chest, abdomen, or pelvis. 3. Right lower lobe ground-glass nodule has resolved since the prior PET 4.  Possible constipation. 5. Coronary artery atherosclerosis. Aortic Atherosclerosis (ICD10-I70.0). Emphysema (ICD10-J43.9). 6. Left nephrolithiasis.   06/15/2022 Imaging    IMPRESSION: 1. Mild increase in size of bilateral pulmonary nodules compatible with progression of metastatic disease. 2. No evidence for metastatic disease within the abdomen or pelvis. 3. Large volume of desiccated stool identified within the rectum. Correlate for any clinical signs or symptoms of constipation. 4. Nonobstructing left renal calculus. 5. Aortic Atherosclerosis (ICD10-I70.0).        INTERVAL HISTORY:  Christy [name] is here for a follow up of  metastatic breast cancer.She was last seen by me on 08/15/2022. She presents to the clinic alone. Pt state that she left her bag in the cab and it had her medicine in the bag. Pt reports that she doesn't fell right. Pt state that when she stands up she is off balance. Pt state she has no falls just dizzy.       All other systems were reviewed with the patient and are negative.  MEDICAL HISTORY:  Past Medical History:  Diagnosis Date   Diabetes mellitus without complication (HCC)    Hyperlipidemia    Hypertension     SURGICAL HISTORY: Past Surgical History:  Procedure Laterality Date   FOOT FRACTURE SURGERY Right    IR IMAGING GUIDED PORT INSERTION  09/22/2020   IR RADIOLOGIST EVAL & MGMT   09/25/2020    I have reviewed the social history and family history with the patient and they are unchanged from previous note.  ALLERGIES:  has No Known Allergies.  MEDICATIONS:  Current Outpatient Medications  Medication Sig Dispense Refill   aspirin EC 325 MG tablet Take 1 tablet (325 mg total) by mouth daily. 60 tablet 3   atorvastatin (LIPITOR) 20 MG tablet Take 1 tablet (20 mg total) by mouth daily. 60 tablet 3   ferrous sulfate 325 (65 FE) MG EC tablet Take 1 tablet (325 mg total) by mouth daily with breakfast. 30 tablet 3   lidocaine-prilocaine (EMLA) cream Apply 1 application topically as needed. 30 g 2   metFORMIN (GLUCOPHAGE) 500 MG tablet Take 1 tablet (500 mg total) by mouth 2 (two) times daily with a meal. 60 tablet 1   olmesartan (BENICAR) 40 MG tablet      potassium chloride SA (KLOR-CON M) 20 MEQ tablet Take 1 tablet (20 mEq total) by mouth daily. 30 tablet 0   tucatinib (TUKYSA) 50 MG tablet Take 1 tablet (50 mg total) by mouth 2 (two) times daily. 60 tablet 0   No current facility-administered medications for this visit.   Facility-Administered Medications Ordered in Other Visits  Medication Dose Route Frequency Provider Last Rate Last Admin  sodium chloride flush (NS) 0.9 % injection 10 mL  10 mL Intracatheter PRN Malachy Mood, MD   10 mL at 08/22/22 1549    PHYSICAL EXAMINATION: ECOG PERFORMANCE STATUS: 2 - Symptomatic, <50% confined to bed  Vitals:   08/22/22 1259  BP: (!) 158/93  Pulse: 72  Temp: 97.7 F (36.5 C)  SpO2: 98%   Wt Readings from Last 3 Encounters:  08/22/22 152 lb 14.4 oz (69.4 kg)  08/15/22 150 lb 14.4 oz (68.4 kg)  08/03/22 151 lb 3.2 oz (68.6 kg)     GENERAL:alert, no distress and comfortable SKIN: skin color normal, no rashes or significant lesions EYES: normal, Conjunctiva are pink and non-injected, sclera clear  NEURO: alert & oriented x 3 with fluent speech  LABORATORY DATA:  I have reviewed the data as listed    Latest Ref  Rng & Units 08/22/2022   12:21 PM 08/15/2022    8:47 AM 08/03/2022    8:11 AM  CBC  WBC 4.0 - 10.5 K/uL 4.7  4.9  5.0   Hemoglobin 12.0 - 15.0 g/dL 9.4  9.6  16.1   Hematocrit 36.0 - 46.0 % 27.1  28.6  30.0   Platelets 150 - 400 K/uL 127  137  130         Latest Ref Rng & Units 08/22/2022   12:21 PM 08/15/2022    8:47 AM 08/03/2022    8:11 AM  CMP  Glucose 70 - 99 mg/dL 096  045  409   BUN 8 - 23 mg/dL 20  24  25    Creatinine 0.44 - 1.00 mg/dL 8.11  9.14  7.82   Sodium 135 - 145 mmol/L 143  142  143   Potassium 3.5 - 5.1 mmol/L 3.5  3.5  3.4   Chloride 98 - 111 mmol/L 111  109  109   CO2 22 - 32 mmol/L 27  27  27    Calcium 8.9 - 10.3 mg/dL 9.9  95.6  21.3   Total Protein 6.5 - 8.1 g/dL 5.9  6.7  6.9   Total Bilirubin 0.3 - 1.2 mg/dL 0.7  0.8  0.9   Alkaline Phos 38 - 126 U/L 104  99  82   AST 15 - 41 U/L 58  65  47   ALT 0 - 44 U/L 33  31  22       RADIOGRAPHIC STUDIES: I have personally reviewed the radiological images as listed and agreed with the findings in the report. No results found.    No orders of the defined types were placed in this encounter.  All questions were answered. The patient knows to call the clinic with any problems, questions or concerns. No barriers to learning was detected. The total time spent in the appointment was 30 minutes.     Malachy Mood, MD 08/22/2022   Carolin Coy, CMA, am acting as scribe for Malachy Mood, MD.   I have reviewed the above documentation for accuracy and completeness, and I agree with the above.

## 2022-08-31 ENCOUNTER — Inpatient Hospital Stay: Payer: Medicare HMO

## 2022-08-31 ENCOUNTER — Ambulatory Visit (HOSPITAL_BASED_OUTPATIENT_CLINIC_OR_DEPARTMENT_OTHER)
Admission: RE | Admit: 2022-08-31 | Discharge: 2022-08-31 | Disposition: A | Discharge status: Home / Self Care | Payer: Medicare HMO | Source: Ambulatory Visit | Attending: Hematology | Admitting: Hematology

## 2022-08-31 DIAGNOSIS — Z0189 Encounter for other specified special examinations: Secondary | ICD-10-CM

## 2022-08-31 DIAGNOSIS — I10 Essential (primary) hypertension: Secondary | ICD-10-CM | POA: Insufficient documentation

## 2022-08-31 DIAGNOSIS — Z87891 Personal history of nicotine dependence: Secondary | ICD-10-CM | POA: Insufficient documentation

## 2022-08-31 DIAGNOSIS — Z171 Estrogen receptor negative status [ER-]: Secondary | ICD-10-CM | POA: Diagnosis not present

## 2022-08-31 DIAGNOSIS — C50412 Malignant neoplasm of upper-outer quadrant of left female breast: Secondary | ICD-10-CM | POA: Insufficient documentation

## 2022-08-31 DIAGNOSIS — E785 Hyperlipidemia, unspecified: Secondary | ICD-10-CM | POA: Insufficient documentation

## 2022-08-31 DIAGNOSIS — I071 Rheumatic tricuspid insufficiency: Secondary | ICD-10-CM | POA: Insufficient documentation

## 2022-08-31 DIAGNOSIS — E119 Type 2 diabetes mellitus without complications: Secondary | ICD-10-CM | POA: Insufficient documentation

## 2022-08-31 DIAGNOSIS — Z5112 Encounter for antineoplastic immunotherapy: Secondary | ICD-10-CM | POA: Diagnosis not present

## 2022-08-31 LAB — ECHOCARDIOGRAM COMPLETE
AR max vel: 2.56 cm2
AV Area VTI: 2.51 cm2
AV Area mean vel: 2.64 cm2
AV Mean grad: 4 mmHg
AV Peak grad: 6.9 mmHg
Ao pk vel: 1.32 m/s
Area-P 1/2: 3.45 cm2
Calc EF: 57.3 %
MV VTI: 2.68 cm2
S' Lateral: 3.6 cm
Single Plane A2C EF: 57.5 %
Single Plane A4C EF: 56.7 %

## 2022-09-07 ENCOUNTER — Ambulatory Visit: Payer: Medicare HMO

## 2022-09-07 ENCOUNTER — Other Ambulatory Visit: Payer: Medicare HMO

## 2022-09-14 ENCOUNTER — Other Ambulatory Visit: Payer: Self-pay

## 2022-09-14 ENCOUNTER — Inpatient Hospital Stay: Payer: Medicare HMO | Attending: Nurse Practitioner | Admitting: Hematology

## 2022-09-14 ENCOUNTER — Encounter: Payer: Self-pay | Admitting: Hematology

## 2022-09-14 ENCOUNTER — Inpatient Hospital Stay: Payer: Medicare HMO

## 2022-09-14 VITALS — BP 173/76 | HR 68 | Resp 18

## 2022-09-14 VITALS — BP 176/77 | HR 74 | Temp 98.0°F | Resp 18 | Ht 64.0 in | Wt 155.1 lb

## 2022-09-14 DIAGNOSIS — I2510 Atherosclerotic heart disease of native coronary artery without angina pectoris: Secondary | ICD-10-CM | POA: Diagnosis not present

## 2022-09-14 DIAGNOSIS — C50412 Malignant neoplasm of upper-outer quadrant of left female breast: Secondary | ICD-10-CM | POA: Insufficient documentation

## 2022-09-14 DIAGNOSIS — C779 Secondary and unspecified malignant neoplasm of lymph node, unspecified: Secondary | ICD-10-CM | POA: Insufficient documentation

## 2022-09-14 DIAGNOSIS — N200 Calculus of kidney: Secondary | ICD-10-CM | POA: Insufficient documentation

## 2022-09-14 DIAGNOSIS — J439 Emphysema, unspecified: Secondary | ICD-10-CM | POA: Insufficient documentation

## 2022-09-14 DIAGNOSIS — Z8673 Personal history of transient ischemic attack (TIA), and cerebral infarction without residual deficits: Secondary | ICD-10-CM | POA: Insufficient documentation

## 2022-09-14 DIAGNOSIS — Z171 Estrogen receptor negative status [ER-]: Secondary | ICD-10-CM

## 2022-09-14 DIAGNOSIS — M5116 Intervertebral disc disorders with radiculopathy, lumbar region: Secondary | ICD-10-CM | POA: Insufficient documentation

## 2022-09-14 DIAGNOSIS — Z5112 Encounter for antineoplastic immunotherapy: Secondary | ICD-10-CM | POA: Diagnosis not present

## 2022-09-14 DIAGNOSIS — C7800 Secondary malignant neoplasm of unspecified lung: Secondary | ICD-10-CM | POA: Insufficient documentation

## 2022-09-14 DIAGNOSIS — I639 Cerebral infarction, unspecified: Secondary | ICD-10-CM

## 2022-09-14 DIAGNOSIS — Z95828 Presence of other vascular implants and grafts: Secondary | ICD-10-CM

## 2022-09-14 DIAGNOSIS — R296 Repeated falls: Secondary | ICD-10-CM | POA: Diagnosis not present

## 2022-09-14 DIAGNOSIS — M4726 Other spondylosis with radiculopathy, lumbar region: Secondary | ICD-10-CM | POA: Insufficient documentation

## 2022-09-14 DIAGNOSIS — I6381 Other cerebral infarction due to occlusion or stenosis of small artery: Secondary | ICD-10-CM | POA: Diagnosis not present

## 2022-09-14 DIAGNOSIS — K921 Melena: Secondary | ICD-10-CM | POA: Insufficient documentation

## 2022-09-14 DIAGNOSIS — Z79899 Other long term (current) drug therapy: Secondary | ICD-10-CM | POA: Insufficient documentation

## 2022-09-14 DIAGNOSIS — I700 Atherosclerosis of aorta: Secondary | ICD-10-CM | POA: Diagnosis not present

## 2022-09-14 LAB — CBC WITH DIFFERENTIAL (CANCER CENTER ONLY)
Abs Immature Granulocytes: 0 10*3/uL (ref 0.00–0.07)
Basophils Absolute: 0 10*3/uL (ref 0.0–0.1)
Basophils Relative: 1 %
Eosinophils Absolute: 0.3 10*3/uL (ref 0.0–0.5)
Eosinophils Relative: 7 %
HCT: 26.9 % — ABNORMAL LOW (ref 36.0–46.0)
Hemoglobin: 9.2 g/dL — ABNORMAL LOW (ref 12.0–15.0)
Immature Granulocytes: 0 %
Lymphocytes Relative: 46 %
Lymphs Abs: 2 10*3/uL (ref 0.7–4.0)
MCH: 26.7 pg (ref 26.0–34.0)
MCHC: 34.2 g/dL (ref 30.0–36.0)
MCV: 78.2 fL — ABNORMAL LOW (ref 80.0–100.0)
Monocytes Absolute: 0.3 10*3/uL (ref 0.1–1.0)
Monocytes Relative: 8 %
Neutro Abs: 1.6 10*3/uL — ABNORMAL LOW (ref 1.7–7.7)
Neutrophils Relative %: 38 %
Platelet Count: 120 10*3/uL — ABNORMAL LOW (ref 150–400)
RBC: 3.44 MIL/uL — ABNORMAL LOW (ref 3.87–5.11)
RDW: 16.3 % — ABNORMAL HIGH (ref 11.5–15.5)
WBC Count: 4.3 10*3/uL (ref 4.0–10.5)
nRBC: 0 % (ref 0.0–0.2)

## 2022-09-14 LAB — CMP (CANCER CENTER ONLY)
ALT: 23 U/L (ref 0–44)
AST: 49 U/L — ABNORMAL HIGH (ref 15–41)
Albumin: 3.1 g/dL — ABNORMAL LOW (ref 3.5–5.0)
Alkaline Phosphatase: 86 U/L (ref 38–126)
Anion gap: 5 (ref 5–15)
BUN: 18 mg/dL (ref 8–23)
CO2: 27 mmol/L (ref 22–32)
Calcium: 10.1 mg/dL (ref 8.9–10.3)
Chloride: 112 mmol/L — ABNORMAL HIGH (ref 98–111)
Creatinine: 1.47 mg/dL — ABNORMAL HIGH (ref 0.44–1.00)
GFR, Estimated: 35 mL/min — ABNORMAL LOW (ref >60)
Glucose, Bld: 96 mg/dL (ref 70–99)
Potassium: 3.7 mmol/L (ref 3.5–5.1)
Sodium: 144 mmol/L (ref 135–145)
Total Bilirubin: 0.9 mg/dL (ref 0.3–1.2)
Total Protein: 6.5 g/dL (ref 6.5–8.1)

## 2022-09-14 MED ORDER — ACETAMINOPHEN 325 MG PO TABS
650.0000 mg | ORAL_TABLET | Freq: Once | ORAL | Status: AC
  Administered 2022-09-14: 650 mg via ORAL
  Filled 2022-09-14: qty 2

## 2022-09-14 MED ORDER — SODIUM CHLORIDE 0.9% FLUSH
10.0000 mL | INTRAVENOUS | Status: DC | PRN
  Administered 2022-09-14: 10 mL

## 2022-09-14 MED ORDER — SODIUM CHLORIDE 0.9 % IV SOLN
3.0000 mg/kg | Freq: Once | INTRAVENOUS | Status: AC
  Administered 2022-09-14: 200 mg via INTRAVENOUS
  Filled 2022-09-14: qty 10

## 2022-09-14 MED ORDER — SODIUM CHLORIDE 0.9 % IV SOLN: Freq: Once | INTRAVENOUS | Status: AC

## 2022-09-14 MED ORDER — DIPHENHYDRAMINE HCL 25 MG PO CAPS
50.0000 mg | ORAL_CAPSULE | Freq: Once | ORAL | Status: AC
  Administered 2022-09-14: 50 mg via ORAL
  Filled 2022-09-14: qty 2

## 2022-09-14 MED ORDER — SODIUM CHLORIDE 0.9% FLUSH
10.0000 mL | Freq: Once | INTRAVENOUS | Status: AC
  Administered 2022-09-14: 10 mL

## 2022-09-14 MED ORDER — HEPARIN SOD (PORK) LOCK FLUSH 100 UNIT/ML IV SOLN
500.0000 [IU] | Freq: Once | INTRAVENOUS | Status: AC | PRN
  Administered 2022-09-14: 500 [IU]

## 2022-09-14 NOTE — Progress Notes (Signed)
[facility]   [phone] [phone]   Clinic Follow up Note   Patient Care Team: [name], MD as PCP - General (Internal Medicine) Malachy Mood, MD as Consulting Physician (Medical Oncology)  Date of Service:  09/14/2022  CHIEF COMPLAINT: f/u of  metastatic breast cancer     CURRENT THERAPY:  Kadcyla q3 weeks starting 09/2020,   ASSESSMENT:  Christy [name] is a 82 y.o. 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 Work-up showed 2 masses in the left breast UOQ and 3 abnormal lymph nodes. 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 -PET scan 08/28/20 showed: hypermetabolism to left breast and axilla, retropectoral and supraclavicular adenopathy, and several 3-6 mm pulmonary nodules. -She started first-line Kadcyla on 09/11/20. She tolerates well with some fatigue, dose decreased with C4.  -She has had good clinical response, breast mass was no longer palpable on physical exam 06/09/21.  -last PET scan in 01/2022 showed two small lung nodule (5-39mm), slightly bigger since last scan, no other hypermetabolic lesions.   -Her restaging CT scan from June 17, 2022 showed multiple enlarging lung nodules, largest measuring 1 cm, previously 0.6 cm.  This is highly concerning for pulmonary metastasis with slight disease progression. No other new lesions -she tried tucatinib 150mg  twice daily but did not tolerate with excessive fatigue and drowsiness -she restarted Tucatinib at 50mg  bid last week , she is tolerating well -I increased Tucatinib to 100mg  bid on 5/29  -She did not tolerate well, with more fatigue, and unstable gait.  She also developed worsening renal function, I stopped Tucatinib on 08/15/2022 -She is clinically stable, renal function has returned to her  baseline, but no improvement in her fatigue and gait.  Will continue holding tucatinib now and continue Kadcyla  -plan to repeat PET scan in 3 weeks, if she has further cancer progression, will stop her treatment   Multiple falls -She is at very high risk for fall, due to her previous stroke, deconditioning from cancer and related treatment -she uses walker  -She also reports black stool lately, will hold on aspirin 325 mg daily -pt declined PT, she had before     Stroke (cerebrum) (HCC) -she is at very high risk for fall, due to her previous stroke, deconditioning from cancer and related treatment -she uses walker  -She also reports black stool lately, will hold on aspirin 325 mg daily -pt declined PT, she had before     PLAN: -lab reviewed - I order CT CAP  scan in 3 weeks -Proceed with Kadcyla today -discontinue Tucatinib 150 mg on 08/15/2022 -lab/flush and Kadcyla 7/31 -plan discussed with her daughter   SUMMARY OF ONCOLOGIC HISTORY: Oncology History Overview Note  Cancer Staging Malignant neoplasm of upper-outer quadrant of female breast (HCC) Staging form: Breast, AJCC 8th Edition - Clinical stage from 07/31/2020: Stage IV (cT2, cN3, cM1, G2, ER-, PR-, HER2+) - Signed by Pollyann Samples, NP on 08/31/2020 Stage prefix: Initial diagnosis Nuclear grade: G2 Histologic grading system: 3 grade system    Malignant neoplasm of upper-outer quadrant of female breast (HCC)  07/24/2020 Breast US   On physical exam, a very firm mass is identified at the 1:30 position of the LEFT breast 5 cm from the nipple.   IMPRESSION: 1. Highly suspicious 4.3 cm mass at  the 1:30 position of the LEFT breast and highly suspicious 2.7 cm mass at the 2 o'clock position of the LEFT breast, with the 2 masses encompassing an area measuring at least 6.1 cm. 2. 3 abnormal LEFT axillary lymph nodes with cortical thickening, tissue sampling of 1 of these lymph nodes recommended. 3. Anterior LEFT breast  skin thickening nonspecific but may reflect dermal involvement. 4. No mammographic evidence of RIGHT breast malignancy.   07/31/2020 Initial Biopsy   Diagnosis 1. Breast, left, needle core biopsy, 1 :30 pm, 5cmfn - INVASIVE DUCTAL CARCINOMA, SEE COMMENT. 2. Breast, left, needle core biopsy, 2 o'clock, 8cmfn - INVASIVE DUCTAL CARCINOMA, SEE COMMENT. - DUCTAL CARCINOMA IN SITU. 3. Lymph node, needle/core biopsy, left axillary - METASTATIC CARCINOMA IN A LYMPH NODE. Microscopic Comment 1. and 3. The carcinoma in both parts has a similar morphology and appears grade 2-3. The DCIS in part 2 is high-grade with necrosis. The carcinoma measures 15 mm (part 1) and 14 mm (part 2) in greatest linear extent  2. PROGNOSTIC INDICATORS Results: IMMUNOHISTOCHEMICAL AND MORPHOMETRIC ANALYSIS PERFORMED MANUALLY The tumor cells are POSITIVE for Her2 (3+). Estrogen Receptor: 0%, NEGATIVE Progesterone Receptor: 0%, NEGATIVE Proliferation Marker Ki67: 20%    07/31/2020 Cancer Staging   Staging form: Breast, AJCC 8th Edition - Clinical stage from 07/31/2020: Stage IV (cT2, cN3, cM1, G2, ER-, PR-, HER2+) - Signed by Pollyann Samples, NP on 08/31/2020 Stage prefix: Initial diagnosis Nuclear grade: G2 Histologic grading system: 3 grade system   08/13/2020 Initial Diagnosis   Malignant neoplasm of upper-outer quadrant of female breast (HCC)   08/28/2020 PET scan   IMPRESSION: Signs of LEFT breast cancer with LEFT axillary and retropectoral adenopathy as well as LEFT supraclavicular adenopathy.   Signs of pulmonary metastatic disease.   Mild asymmetric uptake in the LEFT as compared to the RIGHT adrenal gland. Equivocal but suspicious, attention on follow-up.   Aortic atherosclerosis.   Cystic changes and potential bronchiectasis in the RIGHT lung base likely post infectious or inflammatory. Correlate with any respiratory symptoms. Comparison with prior imaging may be helpful with attention on follow-up.    08/28/2020 Imaging   BRAIN MRI IMPRESSION: 1. Subacute lacunar infarct of the left basal ganglia with no malignant hemorrhagic transformation or mass effect. Underlying chronic bilateral basal ganglia ischemia. 2. No metastatic disease or other acute intracranial abnormality identified.   08/28/2020 Imaging   Breast MRI   IMPRESSION: 1. 8.1 x 8.1 x 5.1 cm area of biopsy-proven malignancy in the central, upper outer and lower outer quadrants of the left breast. 2. Diffuse anterior left breast skin thickening without abnormal enhancement. This may represent thickening due to lymphedema associated with lymphatic obstruction. 3. Metastatic level 1 and level 2 left axillary adenopathy. 4. No evidence of malignancy on the right.   09/11/2020 - 11/03/2021 Chemotherapy   Patient is on Treatment Plan : BREAST ADO-Trastuzumab Emtansine (Kadcyla) q21d     09/11/2020 -  Chemotherapy   Patient is on Treatment Plan : BREAST ADO-Trastuzumab Emtansine (Kadcyla) q21d     11/30/2020 Imaging   CT Chest w/o contrast  IMPRESSION: 1. Interval response to therapy. The left breast mass has mildly decreased in size in the interval. 2. Decrease in size of left axillary and left retropectoral lymph nodes. 3. Small right lung nodules stable to improved. 4. Coronary artery calcifications noted. 5. Aortic Atherosclerosis (ICD10-I70.0).   03/04/2021 PET scan   IMPRESSION: 1. Overall marked improvement compared to the prior PET-CT, with resolution of hypermetabolic  activity and pathologic enlargement of the previously involved lymph nodes; lack of hypermetabolic activity and nonvisualization of prior right-sided lung nodules, and substantial reduction in size and activity in the left breast primary. 2. New ground-glass opacity medially in the right lower lobe with low-grade metabolic activity, highly likely to be inflammatory (alveolitis) given the morphology and appearance. 3. Other imaging findings of  potential clinical significance: Aortic Atherosclerosis (ICD10-I70.0). Coronary atherosclerosis. Mitral valve calcifications. Punctate nonobstructive left renal calculi. Lower lumbar impingement due to chronic spondylosis and degenerative disc disease. Remote left basal ganglia infarct.   06/07/2021 Imaging   EXAM: CT CHEST, ABDOMEN AND PELVIS WITHOUT CONTRAST  IMPRESSION: 1. Scattered tiny bilateral pulmonary nodules are similar to the prior exams, favored to be benign. 2. Otherwise, no evidence of metastatic disease within the chest, abdomen, or pelvis. 3. Right lower lobe ground-glass nodule has resolved since the prior PET 4.  Possible constipation. 5. Coronary artery atherosclerosis. Aortic Atherosclerosis (ICD10-I70.0). Emphysema (ICD10-J43.9). 6. Left nephrolithiasis.   06/15/2022 Imaging    IMPRESSION: 1. Mild increase in size of bilateral pulmonary nodules compatible with progression of metastatic disease. 2. No evidence for metastatic disease within the abdomen or pelvis. 3. Large volume of desiccated stool identified within the rectum. Correlate for any clinical signs or symptoms of constipation. 4. Nonobstructing left renal calculus. 5. Aortic Atherosclerosis (ICD10-I70.0).        INTERVAL HISTORY:  Christy [name] is here for a follow up of  metastatic breast cancer . She was last seen by me on 08/22/2022. She presents to the clinic accompanied by daughter.Pt state she is very active. Pt state that her appetite is good but she doesn't eat a lot.     All other systems were reviewed with the patient and are negative.  MEDICAL HISTORY:  Past Medical History:  Diagnosis Date   Diabetes mellitus without complication (HCC)    Hyperlipidemia    Hypertension     SURGICAL HISTORY: Past Surgical History:  Procedure Laterality Date   FOOT FRACTURE SURGERY Right    IR IMAGING GUIDED PORT INSERTION  09/22/2020   IR RADIOLOGIST EVAL & MGMT  09/25/2020    I have  reviewed the social history and family history with the patient and they are unchanged from previous note.  ALLERGIES:  has No Known Allergies.  MEDICATIONS:  Current Outpatient Medications  Medication Sig Dispense Refill   aspirin EC 325 MG tablet Take 1 tablet (325 mg total) by mouth daily. 60 tablet 3   atorvastatin (LIPITOR) 20 MG tablet Take 1 tablet (20 mg total) by mouth daily. 60 tablet 3   ferrous sulfate 325 (65 FE) MG EC tablet Take 1 tablet (325 mg total) by mouth daily with breakfast. 30 tablet 3   lidocaine-prilocaine (EMLA) cream Apply 1 application topically as needed. 30 g 2   metFORMIN (GLUCOPHAGE) 500 MG tablet Take 1 tablet (500 mg total) by mouth 2 (two) times daily with a meal. 60 tablet 1   olmesartan (BENICAR) 40 MG tablet      potassium chloride SA (KLOR-CON M) 20 MEQ tablet Take 1 tablet (20 mEq total) by mouth daily. 30 tablet 0   tucatinib (TUKYSA) 50 MG tablet Take 1 tablet (50 mg total) by mouth 2 (two) times daily. 60 tablet 0   No current facility-administered medications for this visit.   Facility-Administered Medications Ordered in Other Visits  Medication Dose Route Frequency Provider Last Rate Last Admin   sodium chloride flush (NS) 0.9 % injection  10 mL  10 mL Intracatheter PRN Malachy Mood, MD   10 mL at 09/14/22 1302    PHYSICAL EXAMINATION: ECOG PERFORMANCE STATUS: 1 - Symptomatic but completely ambulatory  Vitals:   09/14/22 1059  BP: (!) 176/77  Pulse: 74  Resp: 18  Temp: 98 F (36.7 C)  SpO2: 97%   Wt Readings from Last 3 Encounters:  09/14/22 155 lb 1.6 oz (70.4 kg)  08/22/22 152 lb 14.4 oz (69.4 kg)  08/15/22 150 lb 14.4 oz (68.4 kg)     GENERAL:alert, no distress and comfortable SKIN: skin color normal, no rashes or significant lesions EYES: normal, Conjunctiva are pink and non-injected, sclera clear  NEURO: alert & oriented x 3 with fluent speech LABORATORY DATA:  I have reviewed the data as listed    Latest Ref Rng & Units  09/14/2022   10:14 AM 08/22/2022   12:21 PM 08/15/2022    8:47 AM  CBC  WBC 4.0 - 10.5 K/uL 4.3  4.7  4.9   Hemoglobin 12.0 - 15.0 g/dL 9.2  9.4  9.6   Hematocrit 36.0 - 46.0 % 26.9  27.1  28.6   Platelets 150 - 400 K/uL 120  127  137         Latest Ref Rng & Units 09/14/2022   10:14 AM 08/22/2022   12:21 PM 08/15/2022    8:47 AM  CMP  Glucose 70 - 99 mg/dL 96  409  811   BUN 8 - 23 mg/dL 18  20  24    Creatinine 0.44 - 1.00 mg/dL 9.14  7.82  9.56   Sodium 135 - 145 mmol/L 144  143  142   Potassium 3.5 - 5.1 mmol/L 3.7  3.5  3.5   Chloride 98 - 111 mmol/L 112  111  109   CO2 22 - 32 mmol/L 27  27  27    Calcium 8.9 - 10.3 mg/dL 21.3  9.9  08.6   Total Protein 6.5 - 8.1 g/dL 6.5  5.9  6.7   Total Bilirubin 0.3 - 1.2 mg/dL 0.9  0.7  0.8   Alkaline Phos 38 - 126 U/L 86  104  99   AST 15 - 41 U/L 49  58  65   ALT 0 - 44 U/L 23  33  31       RADIOGRAPHIC STUDIES: I have personally reviewed the radiological images as listed and agreed with the findings in the report. No results found.    Orders Placed This Encounter  Procedures   CT CHEST ABDOMEN PELVIS WO CONTRAST    Standing Status:   Future    Standing Expiration Date:   09/14/2023    Order Specific Question:   Preferred imaging location?    Answer:   [facility]    Order Specific Question:   If indicated for the ordered procedure, I authorize the administration of oral contrast media per Radiology protocol    Answer:   Yes    Order Specific Question:   Does the patient have a contrast media/X-ray dye allergy?    Answer:   Yes    Order Specific Question:   Release to patient    Answer:   Immediate   All questions were answered. The patient knows to call the clinic with any problems, questions or concerns. No barriers to learning was detected. The total time spent in the appointment was 25 minutes.     Malachy Mood, MD 09/14/2022   I,  Monica Martinez, CMA, am acting as scribe for Malachy Mood, MD.   I have reviewed  the above documentation for accuracy and completeness, and I agree with the above.

## 2022-09-14 NOTE — Assessment & Plan Note (Signed)
-  she is at very high risk for fall, due to her previous stroke, deconditioning from cancer and related treatment -she uses walker  -She also reports black stool lately, will hold on aspirin 325 mg daily -pt declined PT, she had before

## 2022-09-14 NOTE — Patient Instructions (Signed)
[facility]  Discharge Instructions: Thank you for choosing [facility] to provide your oncology and hematology care.   If you have a lab appointment with the Cancer Center, please go directly to the Cancer Center and check in at the registration area.   Wear comfortable clothing and clothing appropriate for easy access to any Portacath or PICC line.   We strive to give you quality time with your provider. You may need to reschedule your appointment if you arrive late (15 or more minutes).  Arriving late affects you and other patients whose appointments are after yours.  Also, if you miss three or more appointments without notifying the office, you may be dismissed from the clinic at the provider's discretion.      For prescription refill requests, have your pharmacy contact our office and allow 72 hours for refills to be completed.    Today you received the following chemotherapy and/or immunotherapy agents: Kadcyla.       To help prevent nausea and vomiting after your treatment, we encourage you to take your nausea medication as directed.  BELOW ARE SYMPTOMS THAT SHOULD BE REPORTED IMMEDIATELY: *FEVER GREATER THAN 100.4 F (38 C) OR HIGHER *CHILLS OR SWEATING *NAUSEA AND VOMITING THAT IS NOT CONTROLLED WITH YOUR NAUSEA MEDICATION *UNUSUAL SHORTNESS OF BREATH *UNUSUAL BRUISING OR BLEEDING *URINARY PROBLEMS (pain or burning when urinating, or frequent urination) *BOWEL PROBLEMS (unusual diarrhea, constipation, pain near the anus) TENDERNESS IN MOUTH AND THROAT WITH OR WITHOUT PRESENCE OF ULCERS (sore throat, sores in mouth, or a toothache) UNUSUAL RASH, SWELLING OR PAIN  UNUSUAL VAGINAL DISCHARGE OR ITCHING   Items with * indicate a potential emergency and should be followed up as soon as possible or go to the Emergency Department if any problems should occur.  Please show the CHEMOTHERAPY ALERT CARD or IMMUNOTHERAPY ALERT CARD at  check-in to the Emergency Department and triage nurse.  Should you have questions after your visit or need to cancel or reschedule your appointment, please contact [facility]  Dept: [phone]  and follow the prompts.  Office hours are 8:00 a.m. to 4:30 p.m. Monday - Friday. Please note that voicemails left after 4:00 p.m. may not be returned until the following business day.  We are closed weekends and major holidays. You have access to a nurse at all times for urgent questions. Please call the main number to the clinic Dept: [phone] and follow the prompts.   For any non-urgent questions, you may also contact your provider using MyChart. We now offer e-Visits for anyone 18 and older to request care online for non-urgent symptoms. For details visit [email or web address].   Also download the MyChart app! Go to the app store, search "MyChart", open the app, select [facility], and log in with your MyChart username and password.   

## 2022-09-14 NOTE — Assessment & Plan Note (Signed)
-  She is at very high risk for fall, due to her previous stroke, deconditioning from cancer and related treatment -she uses walker  -She also reports black stool lately, will hold on aspirin 325 mg daily -pt declined PT, she had before    

## 2022-09-14 NOTE — Assessment & Plan Note (Addendum)
[id number] stage IV, grade 2-3, ER-/PR- HER2+, with nodal and pulmonary metastases -She initially self palpated a left breast mass around 06/2020. Work-up showed 2 masses in the left breast UOQ and 3 abnormal lymph nodes. 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 -PET scan 08/28/20 showed: hypermetabolism to left breast and axilla, retropectoral and supraclavicular adenopathy, and several 3-6 mm pulmonary nodules. -She started first-line Kadcyla on 09/11/20. She tolerates well with some fatigue, dose decreased with C4.  -She has had good clinical response, breast mass was no longer palpable on physical exam 06/09/21.  -last PET scan in 01/2022 showed two small lung nodule (5-42mm), slightly bigger since last scan, no other hypermetabolic lesions.   -Her restaging CT scan from June 17, 2022 showed multiple enlarging lung nodules, largest measuring 1 cm, previously 0.6 cm.  This is highly concerning for pulmonary metastasis with slight disease progression. No other new lesions -she tried tucatinib 150mg  twice daily but did not tolerate with excessive fatigue and drowsiness -she restarted Tucatinib at 50mg  bid last week , she is tolerating well -I increased Tucatinib to 100mg  bid on 5/29  -She did not tolerate well, with more fatigue, and unstable gait.  She also developed worsening renal function, I stopped Tucatinib on 08/15/2022 -She is clinically stable, renal function has returned to her baseline, but no improvement in her fatigue and gait.  Will continue holding tucatinib now and continue Kadcyla  -plan to repeat PET scan in 3 weeks, if she has further cancer progression, will stop her treatment

## 2022-09-15 ENCOUNTER — Other Ambulatory Visit: Payer: Self-pay

## 2022-09-19 ENCOUNTER — Other Ambulatory Visit: Payer: Self-pay | Admitting: Nurse Practitioner

## 2022-09-21 ENCOUNTER — Other Ambulatory Visit: Payer: Self-pay

## 2022-09-28 ENCOUNTER — Ambulatory Visit: Payer: Medicare HMO | Admitting: Hematology

## 2022-09-28 ENCOUNTER — Ambulatory Visit: Payer: Medicare HMO

## 2022-09-28 ENCOUNTER — Other Ambulatory Visit: Payer: Medicare HMO

## 2022-09-30 ENCOUNTER — Ambulatory Visit (HOSPITAL_COMMUNITY): Payer: Medicare HMO

## 2022-10-03 ENCOUNTER — Ambulatory Visit (HOSPITAL_COMMUNITY)
Admission: RE | Admit: 2022-10-03 | Discharge: 2022-10-03 | Disposition: A | Discharge status: Home / Self Care | Payer: Medicare HMO | Source: Ambulatory Visit | Attending: Hematology | Admitting: Hematology

## 2022-10-03 ENCOUNTER — Other Ambulatory Visit: Payer: Self-pay

## 2022-10-03 ENCOUNTER — Inpatient Hospital Stay: Payer: Medicare HMO

## 2022-10-03 DIAGNOSIS — C50412 Malignant neoplasm of upper-outer quadrant of left female breast: Secondary | ICD-10-CM | POA: Insufficient documentation

## 2022-10-03 DIAGNOSIS — Z171 Estrogen receptor negative status [ER-]: Secondary | ICD-10-CM | POA: Insufficient documentation

## 2022-10-03 DIAGNOSIS — Z5112 Encounter for antineoplastic immunotherapy: Secondary | ICD-10-CM | POA: Diagnosis not present

## 2022-10-04 NOTE — Progress Notes (Unsigned)
[facility]   [phone] [phone]   Clinic Follow up Note   Patient Care Team: [name], MD as PCP - General (Internal Medicine) Malachy Mood, MD as Consulting Physician (Medical Oncology)  Date of Service:  10/05/2022  CHIEF COMPLAINT: f/u of metastatic breast cancer     CURRENT THERAPY:  Kadcyla q3 weeks starting 09/2020,     ASSESSMENT:  Christy [name] is a 82 y.o. 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 Work-up showed 2 masses in the left breast UOQ and 3 abnormal lymph nodes. 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 -PET scan 08/28/20 showed: hypermetabolism to left breast and axilla, retropectoral and supraclavicular adenopathy, and several 3-6 mm pulmonary nodules. -She started first-line Kadcyla on 09/11/20. She tolerates well with some fatigue, dose decreased with C4.  -She has had good clinical response, breast mass was no longer palpable on physical exam 06/09/21.  -last PET scan in 01/2022 showed two small lung nodule (5-62mm), slightly bigger since last scan, no other hypermetabolic lesions.   -Her restaging CT scan from June 17, 2022 showed multiple enlarging lung nodules, largest measuring 1 cm, previously 0.6 cm.  This is highly concerning for pulmonary metastasis with slight disease progression. No other new lesions -she tried tucatinib 150mg  twice daily but did not tolerate with excessive fatigue and drowsiness -she restarted Tucatinib at 50mg  bid last week , she is tolerating well -I increased Tucatinib to 100mg  bid on 5/29  -She did not tolerate well, with more fatigue, and unstable gait.  She also developed worsening renal function, I stopped Tucatinib on 08/15/2022 -She is clinically stable, renal function has returned to her  baseline, but no improvement in her fatigue and gait.  Will continue holding tucatinib now and continue Kadcyla  -repeated CT from 10/03/2022 showed mild progression in lung mets (comparing to three previous scans), but overall low tumor burden and no additional new mets. I am not sure if we really need to change treat at this point, she is tolerating Kadcyla well and previously did not tolerated oral HER2 antibodies, so we will continue Kadcyla and monitor her disease closely  -she agrees with the plan, I spoke with her daughter also     PLAN: -reviewed CT w /pt -continue Kadcyla q3 weeks -lab reviewed -CMP -reviewed -proceed with treatment today -lab/flush and f/u  8/21  SUMMARY OF ONCOLOGIC HISTORY: Oncology History Overview Note  Cancer Staging Malignant neoplasm of upper-outer quadrant of female breast (HCC) Staging form: Breast, AJCC 8th Edition - Clinical stage from 07/31/2020: Stage IV (cT2, cN3, cM1, G2, ER-, PR-, HER2+) - Signed by Pollyann Samples, NP on 08/31/2020 Stage prefix: Initial diagnosis Nuclear grade: G2 Histologic grading system: 3 grade system    Malignant neoplasm of upper-outer quadrant of female breast (HCC)  07/24/2020 Breast US   On physical exam, a very firm mass is identified at the 1:30 position of the LEFT breast 5 cm from the nipple.   IMPRESSION: 1. Highly suspicious 4.3 cm mass at the 1:30 position of the LEFT breast and highly suspicious 2.7 cm mass at the 2 o'clock position of the LEFT breast, with the 2 masses encompassing an area measuring at least 6.1 cm. 2. 3 abnormal LEFT axillary lymph nodes with cortical thickening, tissue sampling of 1 of  these lymph nodes recommended. 3. Anterior LEFT breast skin thickening nonspecific but may reflect dermal involvement. 4. No mammographic evidence of RIGHT breast malignancy.   07/31/2020 Initial Biopsy   Diagnosis 1. Breast, left, needle core biopsy, 1 :30 pm, 5cmfn - INVASIVE DUCTAL CARCINOMA, SEE  COMMENT. 2. Breast, left, needle core biopsy, 2 o'clock, 8cmfn - INVASIVE DUCTAL CARCINOMA, SEE COMMENT. - DUCTAL CARCINOMA IN SITU. 3. Lymph node, needle/core biopsy, left axillary - METASTATIC CARCINOMA IN A LYMPH NODE. Microscopic Comment 1. and 3. The carcinoma in both parts has a similar morphology and appears grade 2-3. The DCIS in part 2 is high-grade with necrosis. The carcinoma measures 15 mm (part 1) and 14 mm (part 2) in greatest linear extent  2. PROGNOSTIC INDICATORS Results: IMMUNOHISTOCHEMICAL AND MORPHOMETRIC ANALYSIS PERFORMED MANUALLY The tumor cells are POSITIVE for Her2 (3+). Estrogen Receptor: 0%, NEGATIVE Progesterone Receptor: 0%, NEGATIVE Proliferation Marker Ki67: 20%    07/31/2020 Cancer Staging   Staging form: Breast, AJCC 8th Edition - Clinical stage from 07/31/2020: Stage IV (cT2, cN3, cM1, G2, ER-, PR-, HER2+) - Signed by Pollyann Samples, NP on 08/31/2020 Stage prefix: Initial diagnosis Nuclear grade: G2 Histologic grading system: 3 grade system   08/13/2020 Initial Diagnosis   Malignant neoplasm of upper-outer quadrant of female breast (HCC)   08/28/2020 PET scan   IMPRESSION: Signs of LEFT breast cancer with LEFT axillary and retropectoral adenopathy as well as LEFT supraclavicular adenopathy.   Signs of pulmonary metastatic disease.   Mild asymmetric uptake in the LEFT as compared to the RIGHT adrenal gland. Equivocal but suspicious, attention on follow-up.   Aortic atherosclerosis.   Cystic changes and potential bronchiectasis in the RIGHT lung base likely post infectious or inflammatory. Correlate with any respiratory symptoms. Comparison with prior imaging may be helpful with attention on follow-up.   08/28/2020 Imaging   BRAIN MRI IMPRESSION: 1. Subacute lacunar infarct of the left basal ganglia with no malignant hemorrhagic transformation or mass effect. Underlying chronic bilateral basal ganglia ischemia. 2. No metastatic disease or other  acute intracranial abnormality identified.   08/28/2020 Imaging   Breast MRI   IMPRESSION: 1. 8.1 x 8.1 x 5.1 cm area of biopsy-proven malignancy in the central, upper outer and lower outer quadrants of the left breast. 2. Diffuse anterior left breast skin thickening without abnormal enhancement. This may represent thickening due to lymphedema associated with lymphatic obstruction. 3. Metastatic level 1 and level 2 left axillary adenopathy. 4. No evidence of malignancy on the right.   09/11/2020 - 11/03/2021 Chemotherapy   Patient is on Treatment Plan : BREAST ADO-Trastuzumab Emtansine (Kadcyla) q21d     09/11/2020 -  Chemotherapy   Patient is on Treatment Plan : BREAST ADO-Trastuzumab Emtansine (Kadcyla) q21d     11/30/2020 Imaging   CT Chest w/o contrast  IMPRESSION: 1. Interval response to therapy. The left breast mass has mildly decreased in size in the interval. 2. Decrease in size of left axillary and left retropectoral lymph nodes. 3. Small right lung nodules stable to improved. 4. Coronary artery calcifications noted. 5. Aortic Atherosclerosis (ICD10-I70.0).   03/04/2021 PET scan   IMPRESSION: 1. Overall marked improvement compared to the prior PET-CT, with resolution of hypermetabolic activity and pathologic enlargement of the previously involved lymph nodes; lack of hypermetabolic activity and nonvisualization of prior right-sided lung nodules, and substantial reduction in size and activity in the left breast primary. 2. New ground-glass opacity medially in the right lower lobe with low-grade metabolic activity, highly likely  to be inflammatory (alveolitis) given the morphology and appearance. 3. Other imaging findings of potential clinical significance: Aortic Atherosclerosis (ICD10-I70.0). Coronary atherosclerosis. Mitral valve calcifications. Punctate nonobstructive left renal calculi. Lower lumbar impingement due to chronic spondylosis and degenerative disc disease.  Remote left basal ganglia infarct.   06/07/2021 Imaging   EXAM: CT CHEST, ABDOMEN AND PELVIS WITHOUT CONTRAST  IMPRESSION: 1. Scattered tiny bilateral pulmonary nodules are similar to the prior exams, favored to be benign. 2. Otherwise, no evidence of metastatic disease within the chest, abdomen, or pelvis. 3. Right lower lobe ground-glass nodule has resolved since the prior PET 4.  Possible constipation. 5. Coronary artery atherosclerosis. Aortic Atherosclerosis (ICD10-I70.0). Emphysema (ICD10-J43.9). 6. Left nephrolithiasis.   06/15/2022 Imaging    IMPRESSION: 1. Mild increase in size of bilateral pulmonary nodules compatible with progression of metastatic disease. 2. No evidence for metastatic disease within the abdomen or pelvis. 3. Large volume of desiccated stool identified within the rectum. Correlate for any clinical signs or symptoms of constipation. 4. Nonobstructing left renal calculus. 5. Aortic Atherosclerosis (ICD10-I70.0).     10/03/2022 Imaging    IMPRESSION: 1. Mild but definite progression of pulmonary metastasis. 2. No extrathoracic metastatic disease identified. 3. Incidental findings, including: Left nephrolithiasis. Coronary artery atherosclerosis. Aortic Atherosclerosis (ICD10-I70.0). Possible constipation.      INTERVAL HISTORY:  Christy [name] is here for a follow up of metastatic breast cancer . She was last seen by me on 09/14/2022. She presents to the clinic alone. Pt state that she is doing well. Pt reports of having SOB when she is mobile. Pt state that she has some cough at night. Pt had a recent fall in [place].    All other systems were reviewed with the patient and are negative.  MEDICAL HISTORY:  Past Medical History:  Diagnosis Date   Diabetes mellitus without complication (HCC)    Hyperlipidemia    Hypertension     SURGICAL HISTORY: Past Surgical History:  Procedure Laterality Date   FOOT FRACTURE SURGERY Right    IR  IMAGING GUIDED PORT INSERTION  09/22/2020   IR RADIOLOGIST EVAL & MGMT  09/25/2020    I have reviewed the social history and family history with the patient and they are unchanged from previous note.  ALLERGIES:  has No Known Allergies.  MEDICATIONS:  Current Outpatient Medications  Medication Sig Dispense Refill   aspirin EC 325 MG tablet Take 1 tablet (325 mg total) by mouth daily. 60 tablet 3   atorvastatin (LIPITOR) 20 MG tablet Take 1 tablet (20 mg total) by mouth daily. 60 tablet 3   ferrous sulfate 325 (65 FE) MG EC tablet Take 1 tablet (325 mg total) by mouth daily with breakfast. 30 tablet 3   lidocaine-prilocaine (EMLA) cream Apply 1 application topically as needed. 30 g 2   metFORMIN (GLUCOPHAGE) 500 MG tablet Take 1 tablet (500 mg total) by mouth 2 (two) times daily with a meal. 60 tablet 1   olmesartan (BENICAR) 40 MG tablet      potassium chloride SA (KLOR-CON M) 20 MEQ tablet Take 1 tablet by mouth once daily 30 tablet 0   tucatinib (TUKYSA) 50 MG tablet Take 1 tablet (50 mg total) by mouth 2 (two) times daily. 60 tablet 0   No current facility-administered medications for this visit.   Facility-Administered Medications Ordered in Other Visits  Medication Dose Route Frequency Provider Last Rate Last Admin   sodium chloride flush (NS) 0.9 % injection 10 mL  10 mL  Intracatheter PRN Malachy Mood, MD   10 mL at 10/05/22 1256    PHYSICAL EXAMINATION: ECOG PERFORMANCE STATUS: 2 - Symptomatic, <50% confined to bed  Vitals:   10/05/22 1050  BP: (!) 149/105  Pulse: 71  Resp: 16  Temp: 97.7 F (36.5 C)  SpO2: 98%   Wt Readings from Last 3 Encounters:  10/05/22 153 lb 11.2 oz (69.7 kg)  09/14/22 155 lb 1.6 oz (70.4 kg)  08/22/22 152 lb 14.4 oz (69.4 kg)     GENERAL:alert, no distress and comfortable SKIN: skin color normal, no rashes or significant lesions EYES: normal, Conjunctiva are pink and non-injected, sclera clear  NEURO: alert & oriented x 3 with fluent  speech  LABORATORY DATA:  I have reviewed the data as listed    Latest Ref Rng & Units 10/05/2022   10:27 AM 09/14/2022   10:14 AM 08/22/2022   12:21 PM  CBC  WBC 4.0 - 10.5 K/uL 4.3  4.3  4.7   Hemoglobin 12.0 - 15.0 g/dL 9.6  9.2  9.4   Hematocrit 36.0 - 46.0 % 28.5  26.9  27.1   Platelets 150 - 400 K/uL 142  120  127         Latest Ref Rng & Units 10/05/2022   10:27 AM 09/14/2022   10:14 AM 08/22/2022   12:21 PM  CMP  Glucose 70 - 99 mg/dL 818  96  299   BUN 8 - 23 mg/dL 17  18  20    Creatinine 0.44 - 1.00 mg/dL 3.71  6.96  7.89   Sodium 135 - 145 mmol/L 143  144  143   Potassium 3.5 - 5.1 mmol/L 3.5  3.7  3.5   Chloride 98 - 111 mmol/L 112  112  111   CO2 22 - 32 mmol/L 26  27  27    Calcium 8.9 - 10.3 mg/dL 38.1  01.7  9.9   Total Protein 6.5 - 8.1 g/dL 6.6  6.5  5.9   Total Bilirubin 0.3 - 1.2 mg/dL 0.9  0.9  0.7   Alkaline Phos 38 - 126 U/L 87  86  104   AST 15 - 41 U/L 51  49  58   ALT 0 - 44 U/L 26  23  33       RADIOGRAPHIC STUDIES: I have personally reviewed the radiological images as listed and agreed with the findings in the report. CT CHEST ABDOMEN PELVIS WO CONTRAST  Result Date: 10/04/2022 CLINICAL DATA:  Left breast cancer. Evaluate treatment response. * Tracking Code: BO * EXAM: CT CHEST, ABDOMEN AND PELVIS WITHOUT CONTRAST TECHNIQUE: Multidetector CT imaging of the chest, abdomen and pelvis was performed following the standard protocol without IV contrast. RADIATION DOSE REDUCTION: This exam was performed according to the departmental dose-optimization program which includes automated exposure control, adjustment of the mA and/or kV according to patient size and/or use of iterative reconstruction technique. COMPARISON:  06/15/2022 FINDINGS: CT CHEST FINDINGS Cardiovascular: Right Port-A-Cath tip superior caval/atrial junction. Aortic atherosclerosis. Mild cardiomegaly, without pericardial effusion. Left main and LAD coronary artery calcification.  Mediastinum/Nodes: No supraclavicular adenopathy. No axillary adenopathy. No mediastinal or hilar adenopathy, given limitations of unenhanced CT. No internal mammary adenopathy. Lungs/Pleura: No pleural fluid. Localized cystic bronchiectasis in the right lower lobe is similar and presumably postinfectious/inflammatory. Bilateral pulmonary nodules again identified: -central right middle lobe nodule of 7 mm on 86/4, 6 mm on the prior. -Lateral right lower lobe pulmonary nodule of 4 mm on  95/4, similar. -anterolateral left upper lobe pulmonary nodule measures 12 x 8 mm on 69/4 versus 10 x 7 mm on the prior exam (when remeasured). -Anterolateral left lower lobe pulmonary nodule measures 8 x 7 mm on 108/4 versus similar at 9 x 6 mm on the prior exam. -Subpleural posterior left lower lobe 7 mm nodule on 77/4 measured 6 mm on the prior. -Lateral left lower lobe 5 mm nodule on 86/4 measured 4 mm on the prior. Musculoskeletal: No acute osseous abnormality. Remote right rib fractures. Left breast skin thickening is presumably radiation induced6. CT ABDOMEN PELVIS FINDINGS Hepatobiliary: No noncontrast CT evidence of hepatic metastasis. Normal gallbladder, without biliary ductal dilatation. Pancreas: Normal, without mass or ductal dilatation. Spleen: Normal in size, without focal abnormality. Adrenals/Urinary Tract: Normal adrenal glands. Upper pole left renal 3.3 cm low-density lesion is likely a cyst . In the absence of clinically indicated signs/symptoms require(s) no independent follow-up. Left renal collecting system calculi of up to 3 mm. No hydronephrosis. No bladder calculi. Stomach/Bowel: Normal stomach, without wall thickening. Colonic stool burden suggests constipation. Normal terminal ileum and appendix. Normal small bowel. Vascular/Lymphatic: Aortic atherosclerosis. No abdominopelvic adenopathy. Reproductive: Normal uterus and adnexa. Other: No significant free fluid. Mild pelvic floor laxity. No evidence of  omental or peritoneal disease. Musculoskeletal: No acute osseous abnormality. Lumbosacral spondylosis. IMPRESSION: 1. Mild but definite progression of pulmonary metastasis. 2. No extrathoracic metastatic disease identified. 3. Incidental findings, including: Left nephrolithiasis. Coronary artery atherosclerosis. Aortic Atherosclerosis (ICD10-I70.0). Possible constipation. Electronically Signed   By: Jeronimo Greaves M.D.   On: 10/04/2022 12:30      Orders Placed This Encounter  Procedures   CBC with Differential (Cancer Center Only)    Standing Status:   Future    Standing Expiration Date:   11/16/2023   CMP (Cancer Center only)    Standing Status:   Future    Standing Expiration Date:   11/16/2023   CBC with Differential (Cancer Center Only)    Standing Status:   Future    Standing Expiration Date:   12/07/2023   CMP (Cancer Center only)    Standing Status:   Future    Standing Expiration Date:   12/07/2023   CBC with Differential (Cancer Center Only)    Standing Status:   Future    Standing Expiration Date:   12/28/2023   CMP (Cancer Center only)    Standing Status:   Future    Standing Expiration Date:   12/28/2023   CBC with Differential (Cancer Center Only)    Standing Status:   Future    Standing Expiration Date:   01/18/2024   CMP (Cancer Center only)    Standing Status:   Future    Standing Expiration Date:   01/18/2024   All questions were answered. The patient knows to call the clinic with any problems, questions or concerns. No barriers to learning was detected. The total time spent in the appointment was 30 minutes.     Malachy Mood, MD 10/05/2022   Carolin Coy, CMA, am acting as scribe for Malachy Mood, MD.   I have reviewed the above documentation for accuracy and completeness, and I agree with the above.

## 2022-10-05 ENCOUNTER — Encounter: Payer: Self-pay | Admitting: Hematology

## 2022-10-05 ENCOUNTER — Inpatient Hospital Stay: Payer: Medicare HMO

## 2022-10-05 ENCOUNTER — Inpatient Hospital Stay (HOSPITAL_BASED_OUTPATIENT_CLINIC_OR_DEPARTMENT_OTHER): Payer: Medicare HMO | Admitting: Hematology

## 2022-10-05 ENCOUNTER — Other Ambulatory Visit: Payer: Self-pay

## 2022-10-05 VITALS — BP 179/76 | HR 76

## 2022-10-05 VITALS — BP 149/105 | HR 71 | Temp 97.7°F | Resp 16 | Wt 153.7 lb

## 2022-10-05 DIAGNOSIS — Z95828 Presence of other vascular implants and grafts: Secondary | ICD-10-CM

## 2022-10-05 DIAGNOSIS — Z171 Estrogen receptor negative status [ER-]: Secondary | ICD-10-CM | POA: Diagnosis not present

## 2022-10-05 DIAGNOSIS — C50412 Malignant neoplasm of upper-outer quadrant of left female breast: Secondary | ICD-10-CM

## 2022-10-05 DIAGNOSIS — Z5112 Encounter for antineoplastic immunotherapy: Secondary | ICD-10-CM | POA: Diagnosis not present

## 2022-10-05 LAB — CBC WITH DIFFERENTIAL (CANCER CENTER ONLY)
Abs Immature Granulocytes: 0 10*3/uL (ref 0.00–0.07)
Basophils Absolute: 0 10*3/uL (ref 0.0–0.1)
Basophils Relative: 1 %
Eosinophils Absolute: 0.4 10*3/uL (ref 0.0–0.5)
Eosinophils Relative: 8 %
HCT: 28.5 % — ABNORMAL LOW (ref 36.0–46.0)
Hemoglobin: 9.6 g/dL — ABNORMAL LOW (ref 12.0–15.0)
Immature Granulocytes: 0 %
Lymphocytes Relative: 43 %
Lymphs Abs: 1.8 10*3/uL (ref 0.7–4.0)
MCH: 25.8 pg — ABNORMAL LOW (ref 26.0–34.0)
MCHC: 33.7 g/dL (ref 30.0–36.0)
MCV: 76.6 fL — ABNORMAL LOW (ref 80.0–100.0)
Monocytes Absolute: 0.3 10*3/uL (ref 0.1–1.0)
Monocytes Relative: 8 %
Neutro Abs: 1.7 10*3/uL (ref 1.7–7.7)
Neutrophils Relative %: 40 %
Platelet Count: 142 10*3/uL — ABNORMAL LOW (ref 150–400)
RBC: 3.72 MIL/uL — ABNORMAL LOW (ref 3.87–5.11)
RDW: 15.5 % (ref 11.5–15.5)
WBC Count: 4.3 10*3/uL (ref 4.0–10.5)
nRBC: 0 % (ref 0.0–0.2)

## 2022-10-05 LAB — CMP (CANCER CENTER ONLY)
ALT: 26 U/L (ref 0–44)
AST: 51 U/L — ABNORMAL HIGH (ref 15–41)
Albumin: 3.4 g/dL — ABNORMAL LOW (ref 3.5–5.0)
Alkaline Phosphatase: 87 U/L (ref 38–126)
Anion gap: 5 (ref 5–15)
BUN: 17 mg/dL (ref 8–23)
CO2: 26 mmol/L (ref 22–32)
Calcium: 10.4 mg/dL — ABNORMAL HIGH (ref 8.9–10.3)
Chloride: 112 mmol/L — ABNORMAL HIGH (ref 98–111)
Creatinine: 1.43 mg/dL — ABNORMAL HIGH (ref 0.44–1.00)
GFR, Estimated: 37 mL/min — ABNORMAL LOW (ref >60)
Glucose, Bld: 105 mg/dL — ABNORMAL HIGH (ref 70–99)
Potassium: 3.5 mmol/L (ref 3.5–5.1)
Sodium: 143 mmol/L (ref 135–145)
Total Bilirubin: 0.9 mg/dL (ref 0.3–1.2)
Total Protein: 6.6 g/dL (ref 6.5–8.1)

## 2022-10-05 MED ORDER — SODIUM CHLORIDE 0.9 % IV SOLN: Freq: Once | INTRAVENOUS | Status: AC

## 2022-10-05 MED ORDER — HEPARIN SOD (PORK) LOCK FLUSH 100 UNIT/ML IV SOLN
500.0000 [IU] | Freq: Once | INTRAVENOUS | Status: AC | PRN
  Administered 2022-10-05: 500 [IU]

## 2022-10-05 MED ORDER — SODIUM CHLORIDE 0.9 % IV SOLN
3.0000 mg/kg | Freq: Once | INTRAVENOUS | Status: AC
  Administered 2022-10-05: 200 mg via INTRAVENOUS
  Filled 2022-10-05: qty 10

## 2022-10-05 MED ORDER — SODIUM CHLORIDE 0.9% FLUSH
10.0000 mL | INTRAVENOUS | Status: DC | PRN
  Administered 2022-10-05: 10 mL

## 2022-10-05 MED ORDER — ACETAMINOPHEN 325 MG PO TABS
650.0000 mg | ORAL_TABLET | Freq: Once | ORAL | Status: AC
  Administered 2022-10-05: 650 mg via ORAL
  Filled 2022-10-05: qty 2

## 2022-10-05 MED ORDER — DIPHENHYDRAMINE HCL 25 MG PO CAPS
50.0000 mg | ORAL_CAPSULE | Freq: Once | ORAL | Status: AC
  Administered 2022-10-05: 50 mg via ORAL
  Filled 2022-10-05: qty 2

## 2022-10-05 MED ORDER — SODIUM CHLORIDE 0.9% FLUSH
10.0000 mL | Freq: Once | INTRAVENOUS | Status: AC
  Administered 2022-10-05: 10 mL

## 2022-10-05 NOTE — Progress Notes (Signed)
Patient declined 30 min post infusion observation period.  Tolerated treatment well without incident.  VSS at discharge.  Ambulated to lobby.

## 2022-10-05 NOTE — Patient Instructions (Signed)
[facility]  Discharge Instructions: Thank you for choosing [facility] to provide your oncology and hematology care.   If you have a lab appointment with the Cancer Center, please go directly to the Cancer Center and check in at the registration area.   Wear comfortable clothing and clothing appropriate for easy access to any Portacath or PICC line.   We strive to give you quality time with your provider. You may need to reschedule your appointment if you arrive late (15 or more minutes).  Arriving late affects you and other patients whose appointments are after yours.  Also, if you miss three or more appointments without notifying the office, you may be dismissed from the clinic at the provider's discretion.      For prescription refill requests, have your pharmacy contact our office and allow 72 hours for refills to be completed.    Today you received the following chemotherapy and/or immunotherapy agents: Kadcyla.       To help prevent nausea and vomiting after your treatment, we encourage you to take your nausea medication as directed.  BELOW ARE SYMPTOMS THAT SHOULD BE REPORTED IMMEDIATELY: *FEVER GREATER THAN 100.4 F (38 C) OR HIGHER *CHILLS OR SWEATING *NAUSEA AND VOMITING THAT IS NOT CONTROLLED WITH YOUR NAUSEA MEDICATION *UNUSUAL SHORTNESS OF BREATH *UNUSUAL BRUISING OR BLEEDING *URINARY PROBLEMS (pain or burning when urinating, or frequent urination) *BOWEL PROBLEMS (unusual diarrhea, constipation, pain near the anus) TENDERNESS IN MOUTH AND THROAT WITH OR WITHOUT PRESENCE OF ULCERS (sore throat, sores in mouth, or a toothache) UNUSUAL RASH, SWELLING OR PAIN  UNUSUAL VAGINAL DISCHARGE OR ITCHING   Items with * indicate a potential emergency and should be followed up as soon as possible or go to the Emergency Department if any problems should occur.  Please show the CHEMOTHERAPY ALERT CARD or IMMUNOTHERAPY ALERT CARD at  check-in to the Emergency Department and triage nurse.  Should you have questions after your visit or need to cancel or reschedule your appointment, please contact [facility]  Dept: [phone]  and follow the prompts.  Office hours are 8:00 a.m. to 4:30 p.m. Monday - Friday. Please note that voicemails left after 4:00 p.m. may not be returned until the following business day.  We are closed weekends and major holidays. You have access to a nurse at all times for urgent questions. Please call the main number to the clinic Dept: [phone] and follow the prompts.   For any non-urgent questions, you may also contact your provider using MyChart. We now offer e-Visits for anyone 18 and older to request care online for non-urgent symptoms. For details visit [email or web address].   Also download the MyChart app! Go to the app store, search "MyChart", open the app, select [facility], and log in with your MyChart username and password.   

## 2022-10-05 NOTE — Assessment & Plan Note (Addendum)
[id number] stage IV, grade 2-3, ER-/PR- HER2+, with nodal and pulmonary metastases -She initially self palpated a left breast mass around 06/2020. Work-up showed 2 masses in the left breast UOQ and 3 abnormal lymph nodes. 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 -PET scan 08/28/20 showed: hypermetabolism to left breast and axilla, retropectoral and supraclavicular adenopathy, and several 3-6 mm pulmonary nodules. -She started first-line Kadcyla on 09/11/20. She tolerates well with some fatigue, dose decreased with C4.  -She has had good clinical response, breast mass was no longer palpable on physical exam 06/09/21.  -last PET scan in 01/2022 showed two small lung nodule (5-30mm), slightly bigger since last scan, no other hypermetabolic lesions.   -Her restaging CT scan from June 17, 2022 showed multiple enlarging lung nodules, largest measuring 1 cm, previously 0.6 cm.  This is highly concerning for pulmonary metastasis with slight disease progression. No other new lesions -she tried tucatinib 150mg  twice daily but did not tolerate with excessive fatigue and drowsiness -she restarted Tucatinib at 50mg  bid last week , she is tolerating well -I increased Tucatinib to 100mg  bid on 5/29  -She did not tolerate well, with more fatigue, and unstable gait.  She also developed worsening renal function, I stopped Tucatinib on 08/15/2022 -She is clinically stable, renal function has returned to her baseline, but no improvement in her fatigue and gait.  Will continue holding tucatinib now and continue Kadcyla  -repeated CT from 10/03/2022 showed mild progression in lung mets (comparing to three previous scans), but overall low tumor burden and no additional new mets. I am not sure if we really need to change treat at this point, she is tolerating Kadcyla well and previously did not tolerated oral HER2 antibodies, so we will continue Kadcyla and monitor her disease closely

## 2022-10-08 ENCOUNTER — Other Ambulatory Visit: Payer: Self-pay

## 2022-10-15 ENCOUNTER — Other Ambulatory Visit: Payer: Self-pay

## 2022-10-17 ENCOUNTER — Telehealth: Payer: Self-pay | Admitting: Internal Medicine

## 2022-10-18 ENCOUNTER — Inpatient Hospital Stay: Payer: Medicare HMO | Admitting: Internal Medicine

## 2022-10-19 ENCOUNTER — Ambulatory Visit: Payer: Medicare HMO | Admitting: Hematology

## 2022-10-19 ENCOUNTER — Ambulatory Visit: Payer: Medicare HMO

## 2022-10-19 ENCOUNTER — Other Ambulatory Visit: Payer: Medicare HMO

## 2022-10-21 ENCOUNTER — Other Ambulatory Visit: Payer: Self-pay

## 2022-10-21 ENCOUNTER — Emergency Department (HOSPITAL_COMMUNITY): Payer: Medicare HMO

## 2022-10-21 ENCOUNTER — Encounter (HOSPITAL_COMMUNITY): Payer: Self-pay

## 2022-10-21 ENCOUNTER — Emergency Department (HOSPITAL_COMMUNITY)
Admission: EM | Admit: 2022-10-21 | Discharge: 2022-10-21 | Disposition: A | Discharge status: Home / Self Care | Payer: Medicare HMO | Attending: Emergency Medicine | Admitting: Emergency Medicine

## 2022-10-21 DIAGNOSIS — W19XXXA Unspecified fall, initial encounter: Secondary | ICD-10-CM | POA: Diagnosis not present

## 2022-10-21 DIAGNOSIS — R42 Dizziness and giddiness: Secondary | ICD-10-CM | POA: Diagnosis not present

## 2022-10-21 DIAGNOSIS — R6884 Jaw pain: Secondary | ICD-10-CM | POA: Diagnosis not present

## 2022-10-21 DIAGNOSIS — S0993XA Unspecified injury of face, initial encounter: Secondary | ICD-10-CM | POA: Diagnosis present

## 2022-10-21 DIAGNOSIS — Z7982 Long term (current) use of aspirin: Secondary | ICD-10-CM | POA: Diagnosis not present

## 2022-10-21 DIAGNOSIS — I10 Essential (primary) hypertension: Secondary | ICD-10-CM

## 2022-10-21 DIAGNOSIS — M542 Cervicalgia: Secondary | ICD-10-CM | POA: Insufficient documentation

## 2022-10-21 LAB — URINALYSIS, ROUTINE W REFLEX MICROSCOPIC
Bilirubin Urine: NEGATIVE
Glucose, UA: NEGATIVE mg/dL
Hgb urine dipstick: NEGATIVE
Ketones, ur: NEGATIVE mg/dL
Leukocytes,Ua: NEGATIVE
Nitrite: NEGATIVE
Protein, ur: NEGATIVE mg/dL
Specific Gravity, Urine: 1.009 (ref 1.005–1.030)
pH: 6 (ref 5.0–8.0)

## 2022-10-21 LAB — COMPREHENSIVE METABOLIC PANEL
ALT: 30 U/L (ref 0–44)
AST: 64 U/L — ABNORMAL HIGH (ref 15–41)
Albumin: 3.1 g/dL — ABNORMAL LOW (ref 3.5–5.0)
Alkaline Phosphatase: 76 U/L (ref 38–126)
Anion gap: 7 (ref 5–15)
BUN: 14 mg/dL (ref 8–23)
CO2: 22 mmol/L (ref 22–32)
Calcium: 10.1 mg/dL (ref 8.9–10.3)
Chloride: 113 mmol/L — ABNORMAL HIGH (ref 98–111)
Creatinine, Ser: 1.33 mg/dL — ABNORMAL HIGH (ref 0.44–1.00)
GFR, Estimated: 40 mL/min — ABNORMAL LOW (ref >60)
Glucose, Bld: 83 mg/dL (ref 70–99)
Potassium: 3.3 mmol/L — ABNORMAL LOW (ref 3.5–5.1)
Sodium: 142 mmol/L (ref 135–145)
Total Bilirubin: 1.2 mg/dL (ref 0.3–1.2)
Total Protein: 6.7 g/dL (ref 6.5–8.1)

## 2022-10-21 LAB — CBC WITH DIFFERENTIAL/PLATELET
Abs Immature Granulocytes: 0.01 10*3/uL (ref 0.00–0.07)
Basophils Absolute: 0.1 10*3/uL (ref 0.0–0.1)
Basophils Relative: 1 %
Eosinophils Absolute: 0.3 10*3/uL (ref 0.0–0.5)
Eosinophils Relative: 6 %
HCT: 29.6 % — ABNORMAL LOW (ref 36.0–46.0)
Hemoglobin: 9.7 g/dL — ABNORMAL LOW (ref 12.0–15.0)
Immature Granulocytes: 0 %
Lymphocytes Relative: 38 %
Lymphs Abs: 1.9 10*3/uL (ref 0.7–4.0)
MCH: 25.9 pg — ABNORMAL LOW (ref 26.0–34.0)
MCHC: 32.8 g/dL (ref 30.0–36.0)
MCV: 79.1 fL — ABNORMAL LOW (ref 80.0–100.0)
Monocytes Absolute: 0.4 10*3/uL (ref 0.1–1.0)
Monocytes Relative: 9 %
Neutro Abs: 2.2 10*3/uL (ref 1.7–7.7)
Neutrophils Relative %: 46 %
Platelets: 120 10*3/uL — ABNORMAL LOW (ref 150–400)
RBC: 3.74 MIL/uL — ABNORMAL LOW (ref 3.87–5.11)
RDW: 15.6 % — ABNORMAL HIGH (ref 11.5–15.5)
WBC: 4.8 10*3/uL (ref 4.0–10.5)
nRBC: 0 % (ref 0.0–0.2)

## 2022-10-21 LAB — CBG MONITORING, ED: Glucose-Capillary: 74 mg/dL (ref 70–99)

## 2022-10-21 LAB — ETHANOL: Alcohol, Ethyl (B): <10 mg/dL (ref <10)

## 2022-10-21 MED ORDER — LABETALOL HCL 5 MG/ML IV SOLN
10.0000 mg | Freq: Once | INTRAVENOUS | Status: AC
  Administered 2022-10-21: 10 mg via INTRAVENOUS
  Filled 2022-10-21: qty 4

## 2022-10-21 MED ORDER — IRBESARTAN 300 MG PO TABS
300.0000 mg | ORAL_TABLET | Freq: Every day | ORAL | Status: DC
  Administered 2022-10-21: 300 mg via ORAL
  Filled 2022-10-21: qty 1

## 2022-10-21 NOTE — ED Provider Notes (Signed)
[place] EMERGENCY DEPARTMENT AT [facility] Provider Note   CSN: [id number] Arrival date & time: 10/21/22  1346     History {Add pertinent medical, surgical, social history, OB history to HPI:1} Chief Complaint  Patient presents with   Fall   Dizziness    Christy [name] is a 82 y.o. female.  HPI Presents with her daughter who assists with the history. Patient's history is notable for prior cancer, stroke, and now presents with concern for fall x 2, lightheadedness. Her fall was a few days ago, while she was in [place], second fall last night.  She notes that yesterday she was getting up to commode, felt lightheaded, may have lost consciousness, and fell onto the commode.  A few days ago she fell, striking her face against the bathroom floor.  She does have pain in her right jaw, and neck pain. She declined eval yesterday presents today with daughter who encouraged her for evaluation.    Home Medications Prior to Admission medications   Medication Sig Start Date End Date Taking? Authorizing Provider  aspirin EC 325 MG tablet Take 1 tablet (325 mg total) by mouth daily. 06/21/22   Henreitta Leber, MD  atorvastatin (LIPITOR) 20 MG tablet Take 1 tablet (20 mg total) by mouth daily. 10/26/21   Henreitta Leber, MD  ferrous sulfate 325 (65 FE) MG EC tablet Take 1 tablet (325 mg total) by mouth daily with breakfast. 06/01/22   Georga Kaufmann T, PA-C  lidocaine-prilocaine (EMLA) cream Apply 1 application topically as needed. 01/05/21   Malachy Mood, MD  metFORMIN (GLUCOPHAGE) 500 MG tablet Take 1 tablet (500 mg total) by mouth 2 (two) times daily with a meal. 09/27/19   Dahlia Byes A, NP  olmesartan (BENICAR) 40 MG tablet  07/06/20   [provider]  potassium chloride SA (KLOR-CON M) 20 MEQ tablet Take 1 tablet by mouth once daily 09/19/22   Pollyann Samples, NP  tucatinib (TUKYSA) 50 MG tablet Take 1 tablet (50 mg total) by mouth 2 (two) times daily. 07/25/22    Pollyann Samples, NP      Allergies    Patient has no known allergies.    Review of Systems   Review of Systems  All other systems reviewed and are negative.   Physical Exam Updated Vital Signs BP (!) 180/105 (BP Location: Right Arm)   Pulse 70   Temp 98 F (36.7 C) (Oral)   Resp 19   Ht 5\' 4"  (1.626 m)   Wt 70 kg   SpO2 100%   BMI 26.49 kg/m  Physical Exam Vitals and nursing note reviewed.  Constitutional:      General: She is not in acute distress.    Appearance: She is well-developed.  HENT:     Head: Normocephalic and atraumatic.   Eyes:     Conjunctiva/sclera: Conjunctivae normal.  Cardiovascular:     Rate and Rhythm: Normal rate and regular rhythm.  Pulmonary:     Effort: Pulmonary effort is normal. No respiratory distress.     Breath sounds: Normal breath sounds. No stridor.  Abdominal:     General: There is no distension.  Skin:    General: Skin is warm and dry.  Neurological:     General: No focal deficit present.     Mental Status: She is alert and oriented to person, place, and time.     Cranial Nerves: No cranial nerve deficit.  Psychiatric:  Mood and Affect: Mood normal.     ED Results / Procedures / Treatments   Labs (all labs ordered are listed, but only abnormal results are displayed) Labs Reviewed  CBC WITH DIFFERENTIAL/PLATELET - Abnormal; Notable for the following components:      Result Value   RBC 3.74 (*)    Hemoglobin 9.7 (*)    HCT 29.6 (*)    MCV 79.1 (*)    MCH 25.9 (*)    RDW 15.6 (*)    Platelets 120 (*)    All other components within normal limits  COMPREHENSIVE METABOLIC PANEL  ETHANOL  URINALYSIS, ROUTINE W REFLEX MICROSCOPIC  CBG MONITORING, ED    EKG None  Radiology No results found.  Procedures Procedures  {Document cardiac monitor, telemetry assessment procedure when appropriate:1}  Medications Ordered in ED Medications  irbesartan (AVAPRO) tablet 300 mg (has no administration in time range)     ED Course/ Medical Decision Making/ A&P   {   Click here for ABCD2, HEART and other calculatorsREFRESH Note before signing :1}                              Medical Decision Making , Female with history of stroke, cancer presents with concern for new frequency of falls, lightheadedness.  She is overtly neurologically unremarkable, is awake, alert, with no facial asymmetry, but has pain in multiple areas, and with concern for facial pain in particular, syncope, head trauma, patient had CT scans, x-rays, labs ordered.  Cardiac 70 sinus normal Pulse ox 100% room air normal Patient hypertensive, received her home medications.   Amount and/or Complexity of Data Reviewed Independent Historian:     Details: Daughter at bedside External Data Reviewed: notes. Labs: ordered. Decision-making details documented in ED Course. Radiology: ordered.  Risk Prescription drug management.  Initial labs reassuring, on signout patient is awaiting imaging studies.   {Document critical care time when appropriate:1} {Document review of labs and clinical decision tools ie heart score, Chads2Vasc2 etc:1}  {Document your independent review of radiology images, and any outside records:1} {Document your discussion with family members, caretakers, and with consultants:1} {Document social determinants of health affecting pt's care:1} {Document your decision making why or why not admission, treatments were needed:1} Final Clinical Impression(s) / ED Diagnoses Final diagnoses:  Fall, initial encounter

## 2022-10-21 NOTE — ED Provider Notes (Signed)
Pt initially seen by Dr Jeraldine Loots.  Please see his note.  Plan was to follow on imaging tests.  Pt also noted to be hypertensive while she was here patient was given a dose of her medications.  Notified that her blood pressure is still elevated.  Will give an additional dose of meds.  Imaging test reviewed.  CT scan of head facial bones and C-spine without acute abnormalities.  Lumbar spine pelvis and chest x-ray without acute abnormalities.  Patient's repeat blood pressure is improved to 168/79.  Patient is stable for discharge   Linwood Dibbles, MD 10/21/22 [phone]

## 2022-10-21 NOTE — ED Triage Notes (Signed)
Patient presented to ER for a fall. Patient fell in apartment and hit her hand, patient then fell getting out of the bathtub and hit lip. Patient does not take blood thinners, and states she has been feeling dizzy lately. Patient moves all extremities.

## 2022-10-21 NOTE — Discharge Instructions (Signed)
The test today did not show any signs of serious injury.  Follow-up with your doctor next week to recheck your blood pressure.  Return to the ED as needed for recurrent or worsening symptoms

## 2022-10-25 ENCOUNTER — Telehealth: Payer: Self-pay | Admitting: Internal Medicine

## 2022-10-25 ENCOUNTER — Telehealth: Payer: Self-pay | Admitting: *Deleted

## 2022-10-25 ENCOUNTER — Inpatient Hospital Stay: Payer: Medicare HMO | Attending: Nurse Practitioner | Admitting: Internal Medicine

## 2022-10-25 ENCOUNTER — Inpatient Hospital Stay: Payer: Medicare HMO

## 2022-10-25 NOTE — Telephone Encounter (Signed)
Daughter states patient went to ED on Friday due to a fall. CT Head was done and they said everything is OK. Asking if she still needs to come in to see Dr Barbaraann Cao today at 3:00.

## 2022-10-25 NOTE — Telephone Encounter (Signed)
Pt prefers to be rescheduled. Message sent to scheduler and transportation coordinator

## 2022-10-26 ENCOUNTER — Other Ambulatory Visit: Payer: Self-pay

## 2022-10-26 ENCOUNTER — Inpatient Hospital Stay: Payer: Medicare HMO | Attending: Nurse Practitioner

## 2022-10-26 ENCOUNTER — Encounter: Payer: Self-pay | Admitting: Hematology

## 2022-10-26 ENCOUNTER — Inpatient Hospital Stay: Payer: Medicare HMO

## 2022-10-26 ENCOUNTER — Inpatient Hospital Stay (HOSPITAL_BASED_OUTPATIENT_CLINIC_OR_DEPARTMENT_OTHER): Payer: Medicare HMO | Admitting: Hematology

## 2022-10-26 VITALS — BP 185/81 | HR 77 | Temp 98.0°F | Resp 18 | Wt 154.7 lb

## 2022-10-26 DIAGNOSIS — C50412 Malignant neoplasm of upper-outer quadrant of left female breast: Secondary | ICD-10-CM | POA: Diagnosis present

## 2022-10-26 DIAGNOSIS — I6381 Other cerebral infarction due to occlusion or stenosis of small artery: Secondary | ICD-10-CM | POA: Diagnosis not present

## 2022-10-26 DIAGNOSIS — C7800 Secondary malignant neoplasm of unspecified lung: Secondary | ICD-10-CM | POA: Insufficient documentation

## 2022-10-26 DIAGNOSIS — K921 Melena: Secondary | ICD-10-CM | POA: Insufficient documentation

## 2022-10-26 DIAGNOSIS — Z171 Estrogen receptor negative status [ER-]: Secondary | ICD-10-CM

## 2022-10-26 DIAGNOSIS — N200 Calculus of kidney: Secondary | ICD-10-CM | POA: Insufficient documentation

## 2022-10-26 DIAGNOSIS — R42 Dizziness and giddiness: Secondary | ICD-10-CM | POA: Insufficient documentation

## 2022-10-26 DIAGNOSIS — J439 Emphysema, unspecified: Secondary | ICD-10-CM | POA: Insufficient documentation

## 2022-10-26 DIAGNOSIS — M4726 Other spondylosis with radiculopathy, lumbar region: Secondary | ICD-10-CM | POA: Diagnosis not present

## 2022-10-26 DIAGNOSIS — I700 Atherosclerosis of aorta: Secondary | ICD-10-CM | POA: Diagnosis not present

## 2022-10-26 DIAGNOSIS — Z79899 Other long term (current) drug therapy: Secondary | ICD-10-CM | POA: Insufficient documentation

## 2022-10-26 DIAGNOSIS — I2510 Atherosclerotic heart disease of native coronary artery without angina pectoris: Secondary | ICD-10-CM | POA: Insufficient documentation

## 2022-10-26 DIAGNOSIS — I639 Cerebral infarction, unspecified: Secondary | ICD-10-CM | POA: Diagnosis not present

## 2022-10-26 DIAGNOSIS — Z7982 Long term (current) use of aspirin: Secondary | ICD-10-CM | POA: Diagnosis not present

## 2022-10-26 DIAGNOSIS — R296 Repeated falls: Secondary | ICD-10-CM

## 2022-10-26 DIAGNOSIS — Z8673 Personal history of transient ischemic attack (TIA), and cerebral infarction without residual deficits: Secondary | ICD-10-CM | POA: Insufficient documentation

## 2022-10-26 DIAGNOSIS — Z95828 Presence of other vascular implants and grafts: Secondary | ICD-10-CM

## 2022-10-26 DIAGNOSIS — M5116 Intervertebral disc disorders with radiculopathy, lumbar region: Secondary | ICD-10-CM | POA: Insufficient documentation

## 2022-10-26 LAB — CBC WITH DIFFERENTIAL (CANCER CENTER ONLY)
Abs Immature Granulocytes: 0.01 10*3/uL (ref 0.00–0.07)
Basophils Absolute: 0 10*3/uL (ref 0.0–0.1)
Basophils Relative: 1 %
Eosinophils Absolute: 0.3 10*3/uL (ref 0.0–0.5)
Eosinophils Relative: 6 %
HCT: 27.9 % — ABNORMAL LOW (ref 36.0–46.0)
Hemoglobin: 9.3 g/dL — ABNORMAL LOW (ref 12.0–15.0)
Immature Granulocytes: 0 %
Lymphocytes Relative: 36 %
Lymphs Abs: 1.8 10*3/uL (ref 0.7–4.0)
MCH: 25.8 pg — ABNORMAL LOW (ref 26.0–34.0)
MCHC: 33.3 g/dL (ref 30.0–36.0)
MCV: 77.3 fL — ABNORMAL LOW (ref 80.0–100.0)
Monocytes Absolute: 0.4 10*3/uL (ref 0.1–1.0)
Monocytes Relative: 8 %
Neutro Abs: 2.4 10*3/uL (ref 1.7–7.7)
Neutrophils Relative %: 49 %
Platelet Count: 126 10*3/uL — ABNORMAL LOW (ref 150–400)
RBC: 3.61 MIL/uL — ABNORMAL LOW (ref 3.87–5.11)
RDW: 15.5 % (ref 11.5–15.5)
WBC Count: 5 10*3/uL (ref 4.0–10.5)
nRBC: 0 % (ref 0.0–0.2)

## 2022-10-26 LAB — CMP (CANCER CENTER ONLY)
ALT: 24 U/L (ref 0–44)
AST: 52 U/L — ABNORMAL HIGH (ref 15–41)
Albumin: 3.3 g/dL — ABNORMAL LOW (ref 3.5–5.0)
Alkaline Phosphatase: 87 U/L (ref 38–126)
Anion gap: 3 — ABNORMAL LOW (ref 5–15)
BUN: 15 mg/dL (ref 8–23)
CO2: 28 mmol/L (ref 22–32)
Calcium: 9.9 mg/dL (ref 8.9–10.3)
Chloride: 111 mmol/L (ref 98–111)
Creatinine: 1.44 mg/dL — ABNORMAL HIGH (ref 0.44–1.00)
GFR, Estimated: 36 mL/min — ABNORMAL LOW (ref >60)
Glucose, Bld: 121 mg/dL — ABNORMAL HIGH (ref 70–99)
Potassium: 3.6 mmol/L (ref 3.5–5.1)
Sodium: 142 mmol/L (ref 135–145)
Total Bilirubin: 1 mg/dL (ref 0.3–1.2)
Total Protein: 6.6 g/dL (ref 6.5–8.1)

## 2022-10-26 MED ORDER — LIDOCAINE-PRILOCAINE 2.5-2.5 % EX CREA: 1.0000 | TOPICAL_CREAM | CUTANEOUS | 2 refills | Status: DC | PRN

## 2022-10-26 MED ORDER — SODIUM CHLORIDE 0.9% FLUSH
10.0000 mL | Freq: Once | INTRAVENOUS | Status: AC
  Administered 2022-10-26: 10 mL

## 2022-10-26 NOTE — Progress Notes (Signed)
[facility]   [phone] [phone]   Clinic Follow up Note   Patient Care Team: [name], MD as PCP - General (Internal Medicine) Malachy Mood, MD as Consulting Physician (Medical Oncology)  Date of Service:  10/26/2022  CHIEF COMPLAINT: f/u of metastatic breast cancer     CURRENT THERAPY:  Kadcyla q3 weeks starting 09/2020,   ASSESSMENT:  Christy [name] is a 82 y.o. 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 Work-up showed 2 masses in the left breast UOQ and 3 abnormal lymph nodes. 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 -PET scan 08/28/20 showed: hypermetabolism to left breast and axilla, retropectoral and supraclavicular adenopathy, and several 3-6 mm pulmonary nodules. -She started first-line Kadcyla on 09/11/20. She tolerates well with some fatigue, dose decreased with C4.  -She has had good clinical response, breast mass was no longer palpable on physical exam 06/09/21.  -last PET scan in 01/2022 showed two small lung nodule (5-23mm), slightly bigger since last scan, no other hypermetabolic lesions.   -Her restaging CT scan from June 17, 2022 showed multiple enlarging lung nodules, largest measuring 1 cm, previously 0.6 cm.  This is highly concerning for pulmonary metastasis with slight disease progression. No other new lesions -she tried tucatinib 150mg  twice daily but did not tolerate with excessive fatigue and drowsiness -she restarted Tucatinib at 50mg  bid last week , she is tolerating well -I increased Tucatinib to 100mg  bid on 5/29  -She did not tolerate well, with more fatigue, and unstable gait.  She also developed worsening renal function, I stopped Tucatinib on 08/15/2022 -She is clinically stable, renal function has returned to her  baseline, but no improvement in her fatigue and gait.  Will continue holding tucatinib now and continue Kadcyla  -repeated CT from 10/03/2022 showed mild progression in lung mets (comparing to three previous scans), but overall low tumor burden and no additional new mets. So we decided to continue Kadcyla and monitor her disease closely  -She again had a multiple falls lately, and more fatigued.  We discussed a chemo break.  Will hold treatment for 2 to 3 months.   Stroke (cerebrum) (HCC) -she is at very high risk for fall, due to her previous stroke, deconditioning from cancer and related treatment -she uses walker  -pt declined PT, she had before   Multiple falls -She is at very high risk for fall, due to her previous stroke, deconditioning from cancer and related treatment -she uses walker  -She also reports black stool lately, so aspirin 325 mg daily was held -pt declined PT, she had before  -She was seen by neurologist Dr. Barbaraann Cao in April 2024, and I recommended to him to take aspirin and atorvastatin, I reviewed with her today, and encouraged her to take a baby aspirin and restart atorvastatin. -She has a follow-up appointment with Dr. Barbaraann Cao in Nov, will move it up       PLAN: -recommend taking full dose of Asprin, or baby aspirin if she has tolerance issue. -I recommend holding Chemo for 2-3 months due to fatigue and multiple falls. -lab reviewed hg-9.3 -CMP-reviewed -I refill lidocaine cream -will cancel treatment today due to fatigue -I had Orthostatic vitals on pt, no orthostatic hypotension -lab/flush and f/u in 6 weeks   SUMMARY OF ONCOLOGIC HISTORY: Oncology  History Overview Note  Cancer Staging Malignant neoplasm of upper-outer quadrant of female breast [facility]) Staging form: Breast, AJCC 8th Edition - Clinical stage from 07/31/2020: Stage IV (cT2, cN3, cM1, G2, ER-, PR-, HER2+) - Signed by Pollyann Samples, NP on 08/31/2020 Stage prefix: Initial diagnosis Nuclear  grade: G2 Histologic grading system: 3 grade system    Malignant neoplasm of upper-outer quadrant of female breast (HCC)  07/24/2020 Breast US   On physical exam, a very firm mass is identified at the 1:30 position of the LEFT breast 5 cm from the nipple.   IMPRESSION: 1. Highly suspicious 4.3 cm mass at the 1:30 position of the LEFT breast and highly suspicious 2.7 cm mass at the 2 o'clock position of the LEFT breast, with the 2 masses encompassing an area measuring at least 6.1 cm. 2. 3 abnormal LEFT axillary lymph nodes with cortical thickening, tissue sampling of 1 of these lymph nodes recommended. 3. Anterior LEFT breast skin thickening nonspecific but may reflect dermal involvement. 4. No mammographic evidence of RIGHT breast malignancy.   07/31/2020 Initial Biopsy   Diagnosis 1. Breast, left, needle core biopsy, 1 :30 pm, 5cmfn - INVASIVE DUCTAL CARCINOMA, SEE COMMENT. 2. Breast, left, needle core biopsy, 2 o'clock, 8cmfn - INVASIVE DUCTAL CARCINOMA, SEE COMMENT. - DUCTAL CARCINOMA IN SITU. 3. Lymph node, needle/core biopsy, left axillary - METASTATIC CARCINOMA IN A LYMPH NODE. Microscopic Comment 1. and 3. The carcinoma in both parts has a similar morphology and appears grade 2-3. The DCIS in part 2 is high-grade with necrosis. The carcinoma measures 15 mm (part 1) and 14 mm (part 2) in greatest linear extent  2. PROGNOSTIC INDICATORS Results: IMMUNOHISTOCHEMICAL AND MORPHOMETRIC ANALYSIS PERFORMED MANUALLY The tumor cells are POSITIVE for Her2 (3+). Estrogen Receptor: 0%, NEGATIVE Progesterone Receptor: 0%, NEGATIVE Proliferation Marker Ki67: 20%    07/31/2020 Cancer Staging   Staging form: Breast, AJCC 8th Edition - Clinical stage from 07/31/2020: Stage IV (cT2, cN3, cM1, G2, ER-, PR-, HER2+) - Signed by Pollyann Samples, NP on 08/31/2020 Stage prefix: Initial diagnosis Nuclear grade: G2 Histologic grading system: 3 grade system   08/13/2020 Initial Diagnosis    Malignant neoplasm of upper-outer quadrant of female breast (HCC)   08/28/2020 PET scan   IMPRESSION: Signs of LEFT breast cancer with LEFT axillary and retropectoral adenopathy as well as LEFT supraclavicular adenopathy.   Signs of pulmonary metastatic disease.   Mild asymmetric uptake in the LEFT as compared to the RIGHT adrenal gland. Equivocal but suspicious, attention on follow-up.   Aortic atherosclerosis.   Cystic changes and potential bronchiectasis in the RIGHT lung base likely post infectious or inflammatory. Correlate with any respiratory symptoms. Comparison with prior imaging may be helpful with attention on follow-up.   08/28/2020 Imaging   BRAIN MRI IMPRESSION: 1. Subacute lacunar infarct of the left basal ganglia with no malignant hemorrhagic transformation or mass effect. Underlying chronic bilateral basal ganglia ischemia. 2. No metastatic disease or other acute intracranial abnormality identified.   08/28/2020 Imaging   Breast MRI   IMPRESSION: 1. 8.1 x 8.1 x 5.1 cm area of biopsy-proven malignancy in the central, upper outer and lower outer quadrants of the left breast. 2. Diffuse anterior left breast skin thickening without abnormal enhancement. This may represent thickening due to lymphedema associated with lymphatic obstruction. 3. Metastatic level 1 and level 2 left axillary adenopathy. 4. No evidence of malignancy on the right.   09/11/2020 - 11/03/2021 Chemotherapy   Patient is on Treatment Plan :  BREAST ADO-Trastuzumab Emtansine (Kadcyla) q21d     09/11/2020 -  Chemotherapy   Patient is on Treatment Plan : BREAST ADO-Trastuzumab Emtansine (Kadcyla) q21d     11/30/2020 Imaging   CT Chest w/o contrast  IMPRESSION: 1. Interval response to therapy. The left breast mass has mildly decreased in size in the interval. 2. Decrease in size of left axillary and left retropectoral lymph nodes. 3. Small right lung nodules stable to improved. 4. Coronary artery  calcifications noted. 5. Aortic Atherosclerosis (ICD10-I70.0).   03/04/2021 PET scan   IMPRESSION: 1. Overall marked improvement compared to the prior PET-CT, with resolution of hypermetabolic activity and pathologic enlargement of the previously involved lymph nodes; lack of hypermetabolic activity and nonvisualization of prior right-sided lung nodules, and substantial reduction in size and activity in the left breast primary. 2. New ground-glass opacity medially in the right lower lobe with low-grade metabolic activity, highly likely to be inflammatory (alveolitis) given the morphology and appearance. 3. Other imaging findings of potential clinical significance: Aortic Atherosclerosis (ICD10-I70.0). Coronary atherosclerosis. Mitral valve calcifications. Punctate nonobstructive left renal calculi. Lower lumbar impingement due to chronic spondylosis and degenerative disc disease. Remote left basal ganglia infarct.   06/07/2021 Imaging   EXAM: CT CHEST, ABDOMEN AND PELVIS WITHOUT CONTRAST  IMPRESSION: 1. Scattered tiny bilateral pulmonary nodules are similar to the prior exams, favored to be benign. 2. Otherwise, no evidence of metastatic disease within the chest, abdomen, or pelvis. 3. Right lower lobe ground-glass nodule has resolved since the prior PET 4.  Possible constipation. 5. Coronary artery atherosclerosis. Aortic Atherosclerosis (ICD10-I70.0). Emphysema (ICD10-J43.9). 6. Left nephrolithiasis.   06/15/2022 Imaging    IMPRESSION: 1. Mild increase in size of bilateral pulmonary nodules compatible with progression of metastatic disease. 2. No evidence for metastatic disease within the abdomen or pelvis. 3. Large volume of desiccated stool identified within the rectum. Correlate for any clinical signs or symptoms of constipation. 4. Nonobstructing left renal calculus. 5. Aortic Atherosclerosis (ICD10-I70.0).     10/03/2022 Imaging    IMPRESSION: 1. Mild but definite  progression of pulmonary metastasis. 2. No extrathoracic metastatic disease identified. 3. Incidental findings, including: Left nephrolithiasis. Coronary artery atherosclerosis. Aortic Atherosclerosis (ICD10-I70.0). Possible constipation.      INTERVAL HISTORY:  MEGANA MESNARD is here for a follow up of metastatic breast cancer . She was last seen by me on 10/05/2022. She presents to the clinic accompanied by daughter. Pt state that she fell while on on vacation.Pt state that she feels dizzy before a fall. Pt state that she has fallen twice.Pt state that she very fatigue.     All other systems were reviewed with the patient and are negative.  MEDICAL HISTORY:  Past Medical History:  Diagnosis Date   Diabetes mellitus without complication (HCC)    Hyperlipidemia    Hypertension     SURGICAL HISTORY: Past Surgical History:  Procedure Laterality Date   FOOT FRACTURE SURGERY Right    IR IMAGING GUIDED PORT INSERTION  09/22/2020   IR RADIOLOGIST EVAL & MGMT  09/25/2020    I have reviewed the social history and family history with the patient and they are unchanged from previous note.  ALLERGIES:  has No Known Allergies.  MEDICATIONS:  Current Outpatient Medications  Medication Sig Dispense Refill   aspirin EC 325 MG tablet Take 1 tablet (325 mg total) by mouth daily. 60 tablet 3   atorvastatin (LIPITOR) 20 MG tablet Take 1 tablet (20 mg total) by mouth daily. 60 tablet 3  ferrous sulfate 325 (65 FE) MG EC tablet Take 1 tablet (325 mg total) by mouth daily with breakfast. 30 tablet 3   lidocaine-prilocaine (EMLA) cream Apply 1 Application topically as needed. 30 g 2   metFORMIN (GLUCOPHAGE) 500 MG tablet Take 1 tablet (500 mg total) by mouth 2 (two) times daily with a meal. 60 tablet 1   olmesartan (BENICAR) 40 MG tablet      potassium chloride SA (KLOR-CON M) 20 MEQ tablet Take 1 tablet by mouth once daily 30 tablet 0   No current facility-administered medications for this  visit.    PHYSICAL EXAMINATION: ECOG PERFORMANCE STATUS: 2 - Symptomatic, <50% confined to bed  Vitals:   10/26/22 1055 10/26/22 1121  BP: (!) 188/85 (!) 185/81  Pulse:    Resp:    Temp:    SpO2:     Wt Readings from Last 3 Encounters:  10/26/22 154 lb 11.2 oz (70.2 kg)  10/21/22 154 lb 5.2 oz (70 kg)  10/05/22 153 lb 11.2 oz (69.7 kg)     GENERAL:alert, no distress and comfortable SKIN: skin color normal, no rashes or significant lesions EYES: normal, Conjunctiva are pink and non-injected, sclera clear  NEURO: alert & oriented x 3 with fluent speech LABORATORY DATA:  I have reviewed the data as listed    Latest Ref Rng & Units 10/26/2022   10:21 AM 10/21/2022    3:40 PM 10/05/2022   10:27 AM  CBC  WBC 4.0 - 10.5 K/uL 5.0  4.8  4.3   Hemoglobin 12.0 - 15.0 g/dL 9.3  9.7  9.6   Hematocrit 36.0 - 46.0 % 27.9  29.6  28.5   Platelets 150 - 400 K/uL 126  120  142         Latest Ref Rng & Units 10/26/2022   10:21 AM 10/21/2022    3:40 PM 10/05/2022   10:27 AM  CMP  Glucose 70 - 99 mg/dL 161  83  096   BUN 8 - 23 mg/dL 15  14  17    Creatinine 0.44 - 1.00 mg/dL 0.45  4.09  8.11   Sodium 135 - 145 mmol/L 142  142  143   Potassium 3.5 - 5.1 mmol/L 3.6  3.3  3.5   Chloride 98 - 111 mmol/L 111  113  112   CO2 22 - 32 mmol/L 28  22  26    Calcium 8.9 - 10.3 mg/dL 9.9  91.4  78.2   Total Protein 6.5 - 8.1 g/dL 6.6  6.7  6.6   Total Bilirubin 0.3 - 1.2 mg/dL 1.0  1.2  0.9   Alkaline Phos 38 - 126 U/L 87  76  87   AST 15 - 41 U/L 52  64  51   ALT 0 - 44 U/L 24  30  26        RADIOGRAPHIC STUDIES: I have personally reviewed the radiological images as listed and agreed with the findings in the report. No results found.    No orders of the defined types were placed in this encounter.  All questions were answered. The patient knows to call the clinic with any problems, questions or concerns. No barriers to learning was detected. The total time spent in the appointment was 30  minutes.     Malachy Mood, MD 10/26/2022   Carolin Coy, CMA, am acting as scribe for Malachy Mood, MD.   I have reviewed the above documentation for accuracy and completeness, and I  agree with the above.

## 2022-10-26 NOTE — Assessment & Plan Note (Signed)
-  She is at very high risk for fall, due to her previous stroke, deconditioning from cancer and related treatment -she uses walker  -She also reports black stool lately, will hold on aspirin 325 mg daily -pt declined PT, she had before    

## 2022-10-26 NOTE — Assessment & Plan Note (Signed)
-  she is at very high risk for fall, due to her previous stroke, deconditioning from cancer and related treatment -she uses walker  -She also reports black stool lately, will hold on aspirin 325 mg daily -pt declined PT, she had before

## 2022-10-26 NOTE — Assessment & Plan Note (Addendum)
[id number] stage IV, grade 2-3, ER-/PR- HER2+, with nodal and pulmonary metastases -She initially self palpated a left breast mass around 06/2020. Work-up showed 2 masses in the left breast UOQ and 3 abnormal lymph nodes. 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 -PET scan 08/28/20 showed: hypermetabolism to left breast and axilla, retropectoral and supraclavicular adenopathy, and several 3-6 mm pulmonary nodules. -She started first-line Kadcyla on 09/11/20. She tolerates well with some fatigue, dose decreased with C4.  -She has had good clinical response, breast mass was no longer palpable on physical exam 06/09/21.  -last PET scan in 01/2022 showed two small lung nodule (5-61mm), slightly bigger since last scan, no other hypermetabolic lesions.   -Her restaging CT scan from June 17, 2022 showed multiple enlarging lung nodules, largest measuring 1 cm, previously 0.6 cm.  This is highly concerning for pulmonary metastasis with slight disease progression. No other new lesions -she tried tucatinib 150mg  twice daily but did not tolerate with excessive fatigue and drowsiness -she restarted Tucatinib at 50mg  bid last week , she is tolerating well -I increased Tucatinib to 100mg  bid on 5/29  -She did not tolerate well, with more fatigue, and unstable gait.  She also developed worsening renal function, I stopped Tucatinib on 08/15/2022 -She is clinically stable, renal function has returned to her baseline, but no improvement in her fatigue and gait.  Will continue holding tucatinib now and continue Kadcyla  -repeated CT from 10/03/2022 showed mild progression in lung mets (comparing to three previous scans), but overall low tumor burden and no additional new mets. So we decided to continue Kadcyla and monitor her disease closely  -She again had a multiple falls lately, and more fatigued.  We discussed a chemo break.  Will hold treatment for 2 to 3 months.

## 2022-11-16 ENCOUNTER — Ambulatory Visit: Payer: Medicare HMO | Admitting: Nurse Practitioner

## 2022-11-16 ENCOUNTER — Ambulatory Visit: Payer: Medicare HMO

## 2022-11-16 ENCOUNTER — Other Ambulatory Visit: Payer: Medicare HMO

## 2022-12-02 ENCOUNTER — Other Ambulatory Visit: Payer: Self-pay | Admitting: Internal Medicine

## 2022-12-06 ENCOUNTER — Inpatient Hospital Stay (HOSPITAL_BASED_OUTPATIENT_CLINIC_OR_DEPARTMENT_OTHER): Payer: Medicare HMO | Admitting: Hematology

## 2022-12-06 ENCOUNTER — Inpatient Hospital Stay: Payer: Medicare HMO | Attending: Hematology

## 2022-12-06 ENCOUNTER — Inpatient Hospital Stay: Payer: Medicare HMO

## 2022-12-06 ENCOUNTER — Inpatient Hospital Stay (HOSPITAL_BASED_OUTPATIENT_CLINIC_OR_DEPARTMENT_OTHER): Payer: Medicare HMO | Admitting: Internal Medicine

## 2022-12-06 ENCOUNTER — Other Ambulatory Visit: Payer: Self-pay

## 2022-12-06 VITALS — BP 172/72 | HR 69 | Temp 98.0°F | Resp 17 | Ht 64.0 in | Wt 150.2 lb

## 2022-12-06 DIAGNOSIS — C7800 Secondary malignant neoplasm of unspecified lung: Secondary | ICD-10-CM | POA: Diagnosis not present

## 2022-12-06 DIAGNOSIS — R234 Changes in skin texture: Secondary | ICD-10-CM | POA: Diagnosis not present

## 2022-12-06 DIAGNOSIS — J439 Emphysema, unspecified: Secondary | ICD-10-CM | POA: Diagnosis not present

## 2022-12-06 DIAGNOSIS — I2510 Atherosclerotic heart disease of native coronary artery without angina pectoris: Secondary | ICD-10-CM | POA: Insufficient documentation

## 2022-12-06 DIAGNOSIS — Z171 Estrogen receptor negative status [ER-]: Secondary | ICD-10-CM

## 2022-12-06 DIAGNOSIS — D649 Anemia, unspecified: Secondary | ICD-10-CM | POA: Diagnosis not present

## 2022-12-06 DIAGNOSIS — I700 Atherosclerosis of aorta: Secondary | ICD-10-CM | POA: Insufficient documentation

## 2022-12-06 DIAGNOSIS — I639 Cerebral infarction, unspecified: Secondary | ICD-10-CM

## 2022-12-06 DIAGNOSIS — R42 Dizziness and giddiness: Secondary | ICD-10-CM | POA: Insufficient documentation

## 2022-12-06 DIAGNOSIS — Z9181 History of falling: Secondary | ICD-10-CM | POA: Insufficient documentation

## 2022-12-06 DIAGNOSIS — E119 Type 2 diabetes mellitus without complications: Secondary | ICD-10-CM | POA: Insufficient documentation

## 2022-12-06 DIAGNOSIS — Z79899 Other long term (current) drug therapy: Secondary | ICD-10-CM | POA: Diagnosis not present

## 2022-12-06 DIAGNOSIS — R1310 Dysphagia, unspecified: Secondary | ICD-10-CM | POA: Diagnosis not present

## 2022-12-06 DIAGNOSIS — M4726 Other spondylosis with radiculopathy, lumbar region: Secondary | ICD-10-CM | POA: Insufficient documentation

## 2022-12-06 DIAGNOSIS — Z7982 Long term (current) use of aspirin: Secondary | ICD-10-CM | POA: Insufficient documentation

## 2022-12-06 DIAGNOSIS — Z91199 Patient's noncompliance with other medical treatment and regimen due to unspecified reason: Secondary | ICD-10-CM | POA: Insufficient documentation

## 2022-12-06 DIAGNOSIS — R296 Repeated falls: Secondary | ICD-10-CM | POA: Insufficient documentation

## 2022-12-06 DIAGNOSIS — N200 Calculus of kidney: Secondary | ICD-10-CM | POA: Diagnosis not present

## 2022-12-06 DIAGNOSIS — M5116 Intervertebral disc disorders with radiculopathy, lumbar region: Secondary | ICD-10-CM | POA: Insufficient documentation

## 2022-12-06 DIAGNOSIS — I6381 Other cerebral infarction due to occlusion or stenosis of small artery: Secondary | ICD-10-CM | POA: Insufficient documentation

## 2022-12-06 DIAGNOSIS — Z5982 Transportation insecurity: Secondary | ICD-10-CM | POA: Diagnosis not present

## 2022-12-06 DIAGNOSIS — Z5112 Encounter for antineoplastic immunotherapy: Secondary | ICD-10-CM | POA: Diagnosis present

## 2022-12-06 DIAGNOSIS — Z8673 Personal history of transient ischemic attack (TIA), and cerebral infarction without residual deficits: Secondary | ICD-10-CM | POA: Diagnosis not present

## 2022-12-06 DIAGNOSIS — Z95828 Presence of other vascular implants and grafts: Secondary | ICD-10-CM

## 2022-12-06 DIAGNOSIS — C50412 Malignant neoplasm of upper-outer quadrant of left female breast: Secondary | ICD-10-CM | POA: Insufficient documentation

## 2022-12-06 LAB — CBC WITH DIFFERENTIAL (CANCER CENTER ONLY)
Abs Immature Granulocytes: 0 10*3/uL (ref 0.00–0.07)
Basophils Absolute: 0 10*3/uL (ref 0.0–0.1)
Basophils Relative: 1 %
Eosinophils Absolute: 0.3 10*3/uL (ref 0.0–0.5)
Eosinophils Relative: 6 %
HCT: 29.9 % — ABNORMAL LOW (ref 36.0–46.0)
Hemoglobin: 10.4 g/dL — ABNORMAL LOW (ref 12.0–15.0)
Immature Granulocytes: 0 %
Lymphocytes Relative: 43 %
Lymphs Abs: 2 10*3/uL (ref 0.7–4.0)
MCH: 26.3 pg (ref 26.0–34.0)
MCHC: 34.8 g/dL (ref 30.0–36.0)
MCV: 75.7 fL — ABNORMAL LOW (ref 80.0–100.0)
Monocytes Absolute: 0.4 10*3/uL (ref 0.1–1.0)
Monocytes Relative: 8 %
Neutro Abs: 2 10*3/uL (ref 1.7–7.7)
Neutrophils Relative %: 42 %
Platelet Count: 141 10*3/uL — ABNORMAL LOW (ref 150–400)
RBC: 3.95 MIL/uL (ref 3.87–5.11)
RDW: 16.5 % — ABNORMAL HIGH (ref 11.5–15.5)
WBC Count: 4.6 10*3/uL (ref 4.0–10.5)
nRBC: 0 % (ref 0.0–0.2)

## 2022-12-06 LAB — CMP (CANCER CENTER ONLY)
ALT: 25 U/L (ref 0–44)
AST: 46 U/L — ABNORMAL HIGH (ref 15–41)
Albumin: 3.6 g/dL (ref 3.5–5.0)
Alkaline Phosphatase: 76 U/L (ref 38–126)
Anion gap: 5 (ref 5–15)
BUN: 18 mg/dL (ref 8–23)
CO2: 27 mmol/L (ref 22–32)
Calcium: 10.5 mg/dL — ABNORMAL HIGH (ref 8.9–10.3)
Chloride: 111 mmol/L (ref 98–111)
Creatinine: 1.48 mg/dL — ABNORMAL HIGH (ref 0.44–1.00)
GFR, Estimated: 35 mL/min — ABNORMAL LOW (ref >60)
Glucose, Bld: 101 mg/dL — ABNORMAL HIGH (ref 70–99)
Potassium: 3.8 mmol/L (ref 3.5–5.1)
Sodium: 143 mmol/L (ref 135–145)
Total Bilirubin: 1 mg/dL (ref 0.3–1.2)
Total Protein: 6.8 g/dL (ref 6.5–8.1)

## 2022-12-06 MED ORDER — HEPARIN SOD (PORK) LOCK FLUSH 100 UNIT/ML IV SOLN
500.0000 [IU] | Freq: Once | INTRAVENOUS | Status: AC
  Administered 2022-12-06: 500 [IU]

## 2022-12-06 MED ORDER — SODIUM CHLORIDE 0.9% FLUSH
10.0000 mL | Freq: Once | INTRAVENOUS | Status: AC
  Administered 2022-12-06: 10 mL

## 2022-12-06 NOTE — Progress Notes (Signed)
[facility] at [facility] [address]  [place], [place] [address] [phone]   Interval Evaluation  Date of Service: 12/06/22 Patient Name: Christy [name] Patient MRN: [id number] Patient DOB: 28-Jul-1940 Provider: Henreitta Leber, MD  Identifying Statement:  Christy [name] is a 82 y.o. female with Cerebrovascular accident (CVA), unspecified mechanism (HCC)    Primary Cancer:  Oncologic History: Oncology History Overview Note  Cancer Staging Malignant neoplasm of upper-outer quadrant of female breast [facility]) Staging form: Breast, AJCC 8th Edition - Clinical stage from 07/31/2020: Stage IV (cT2, cN3, cM1, G2, ER-, PR-, HER2+) - Signed by Pollyann Samples, NP on 08/31/2020 Stage prefix: Initial diagnosis Nuclear grade: G2 Histologic grading system: 3 grade system    Malignant neoplasm of upper-outer quadrant of female breast (HCC)  07/24/2020 Breast US   On physical exam, a very firm mass is identified at the 1:30 position of the LEFT breast 5 cm from the nipple.   IMPRESSION: 1. Highly suspicious 4.3 cm mass at the 1:30 position of the LEFT breast and highly suspicious 2.7 cm mass at the 2 o'clock position of the LEFT breast, with the 2 masses encompassing an area measuring at least 6.1 cm. 2. 3 abnormal LEFT axillary lymph nodes with cortical thickening, tissue sampling of 1 of these lymph nodes recommended. 3. Anterior LEFT breast skin thickening nonspecific but may reflect dermal involvement. 4. No mammographic evidence of RIGHT breast malignancy.   07/31/2020 Initial Biopsy   Diagnosis 1. Breast, left, needle core biopsy, 1 :30 pm, 5cmfn - INVASIVE DUCTAL CARCINOMA, SEE COMMENT. 2. Breast, left, needle core biopsy, 2 o'clock, 8cmfn - INVASIVE DUCTAL CARCINOMA, SEE COMMENT. - DUCTAL CARCINOMA IN SITU. 3. Lymph node, needle/core biopsy, left axillary - METASTATIC CARCINOMA IN A LYMPH NODE. Microscopic Comment 1. and 3. The carcinoma in  both parts has a similar morphology and appears grade 2-3. The DCIS in part 2 is high-grade with necrosis. The carcinoma measures 15 mm (part 1) and 14 mm (part 2) in greatest linear extent  2. PROGNOSTIC INDICATORS Results: IMMUNOHISTOCHEMICAL AND MORPHOMETRIC ANALYSIS PERFORMED MANUALLY The tumor cells are POSITIVE for Her2 (3+). Estrogen Receptor: 0%, NEGATIVE Progesterone Receptor: 0%, NEGATIVE Proliferation Marker Ki67: 20%    07/31/2020 Cancer Staging   Staging form: Breast, AJCC 8th Edition - Clinical stage from 07/31/2020: Stage IV (cT2, cN3, cM1, G2, ER-, PR-, HER2+) - Signed by Pollyann Samples, NP on 08/31/2020 Stage prefix: Initial diagnosis Nuclear grade: G2 Histologic grading system: 3 grade system   08/13/2020 Initial Diagnosis   Malignant neoplasm of upper-outer quadrant of female breast (HCC)   08/28/2020 PET scan   IMPRESSION: Signs of LEFT breast cancer with LEFT axillary and retropectoral adenopathy as well as LEFT supraclavicular adenopathy.   Signs of pulmonary metastatic disease.   Mild asymmetric uptake in the LEFT as compared to the RIGHT adrenal gland. Equivocal but suspicious, attention on follow-up.   Aortic atherosclerosis.   Cystic changes and potential bronchiectasis in the RIGHT lung base likely post infectious or inflammatory. Correlate with any respiratory symptoms. Comparison with prior imaging may be helpful with attention on follow-up.   08/28/2020 Imaging   BRAIN MRI IMPRESSION: 1. Subacute lacunar infarct of the left basal ganglia with no malignant hemorrhagic transformation or mass effect. Underlying chronic bilateral basal ganglia ischemia. 2. No metastatic disease or other acute intracranial abnormality identified.   08/28/2020 Imaging   Breast MRI   IMPRESSION: 1. 8.1 x 8.1 x 5.1 cm  area of biopsy-proven malignancy in the central, upper outer and lower outer quadrants of the left breast. 2. Diffuse anterior left breast skin thickening  without abnormal enhancement. This may represent thickening due to lymphedema associated with lymphatic obstruction. 3. Metastatic level 1 and level 2 left axillary adenopathy. 4. No evidence of malignancy on the right.   09/11/2020 - 11/03/2021 Chemotherapy   Patient is on Treatment Plan : BREAST ADO-Trastuzumab Emtansine (Kadcyla) q21d     09/11/2020 -  Chemotherapy   Patient is on Treatment Plan : BREAST ADO-Trastuzumab Emtansine (Kadcyla) q21d     11/30/2020 Imaging   CT Chest w/o contrast  IMPRESSION: 1. Interval response to therapy. The left breast mass has mildly decreased in size in the interval. 2. Decrease in size of left axillary and left retropectoral lymph nodes. 3. Small right lung nodules stable to improved. 4. Coronary artery calcifications noted. 5. Aortic Atherosclerosis (ICD10-I70.0).   03/04/2021 PET scan   IMPRESSION: 1. Overall marked improvement compared to the prior PET-CT, with resolution of hypermetabolic activity and pathologic enlargement of the previously involved lymph nodes; lack of hypermetabolic activity and nonvisualization of prior right-sided lung nodules, and substantial reduction in size and activity in the left breast primary. 2. New ground-glass opacity medially in the right lower lobe with low-grade metabolic activity, highly likely to be inflammatory (alveolitis) given the morphology and appearance. 3. Other imaging findings of potential clinical significance: Aortic Atherosclerosis (ICD10-I70.0). Coronary atherosclerosis. Mitral valve calcifications. Punctate nonobstructive left renal calculi. Lower lumbar impingement due to chronic spondylosis and degenerative disc disease. Remote left basal ganglia infarct.   06/07/2021 Imaging   EXAM: CT CHEST, ABDOMEN AND PELVIS WITHOUT CONTRAST  IMPRESSION: 1. Scattered tiny bilateral pulmonary nodules are similar to the prior exams, favored to be benign. 2. Otherwise, no evidence of metastatic disease  within the chest, abdomen, or pelvis. 3. Right lower lobe ground-glass nodule has resolved since the prior PET 4.  Possible constipation. 5. Coronary artery atherosclerosis. Aortic Atherosclerosis (ICD10-I70.0). Emphysema (ICD10-J43.9). 6. Left nephrolithiasis.   06/15/2022 Imaging    IMPRESSION: 1. Mild increase in size of bilateral pulmonary nodules compatible with progression of metastatic disease. 2. No evidence for metastatic disease within the abdomen or pelvis. 3. Large volume of desiccated stool identified within the rectum. Correlate for any clinical signs or symptoms of constipation. 4. Nonobstructing left renal calculus. 5. Aortic Atherosclerosis (ICD10-I70.0).     10/03/2022 Imaging    IMPRESSION: 1. Mild but definite progression of pulmonary metastasis. 2. No extrathoracic metastatic disease identified. 3. Incidental findings, including: Left nephrolithiasis. Coronary artery atherosclerosis. Aortic Atherosclerosis (ICD10-I70.0). Possible constipation.     Interval History: Christy [name] presents today for evaluation.  She denies any recurrence of prior stroke symptoms.  She has been compliant with Aspirin and statin medication.  She is unclear if she is taking a blood pressure medication.  Denies seizures, headaches.  May resume Kadcyla with Dr. Mosetta Putt in the coming weeks.  Prior- presents for evaluation today after recent falls.  She describes several episodes of "right leg and right side giving out" leading to falls.  This typically is followed by return or gradual return back to baseline strength.  No issues with left side, no frank seizure episodes.  Denies headaches.  She is not taking Aspirin for stroke prevention, nor the statin.  Continues on metformin for diabetes control.  Sees Dr. Mosetta Putt for Kadcyla for breast cancer.  H+P (09/21/20) Patient presented to neurologic attention several weeks ago, with complaint of  right leg weakness and right hand clumsiness.   She describes acute decline in right leg function, on top of chronic orthopedic and pain associated limitations of both legs.  She was baseline ambulatory with a walker only prior to this change, and continues to be, although overall activity level is minimal.  She also describes change in right hand function, with noted difficulty in producing clean handwriting and buttoning small shirt buttons.  Otherwise no other neurologic complaints.  Brain MRI demonstrate a stroke, which prompted eval today.  She has been dosing aspirin 81mg  daily per Dr. Mosetta Putt instructions.    Medications: Current Outpatient Medications on File Prior to Visit  Medication Sig Dispense Refill   aspirin EC 325 MG tablet Take 1 tablet (325 mg total) by mouth daily. 60 tablet 3   atorvastatin (LIPITOR) 20 MG tablet Take 1 tablet by mouth once daily 60 tablet 0   ferrous sulfate 325 (65 FE) MG EC tablet Take 1 tablet (325 mg total) by mouth daily with breakfast. 30 tablet 3   lidocaine-prilocaine (EMLA) cream Apply 1 Application topically as needed. 30 g 2   metFORMIN (GLUCOPHAGE) 500 MG tablet Take 1 tablet (500 mg total) by mouth 2 (two) times daily with a meal. 60 tablet 1   olmesartan (BENICAR) 40 MG tablet      potassium chloride SA (KLOR-CON M) 20 MEQ tablet Take 1 tablet by mouth once daily 30 tablet 0   No current facility-administered medications on file prior to visit.    Allergies: No Known Allergies Past Medical History:  Past Medical History:  Diagnosis Date   Diabetes mellitus without complication (HCC)    Hyperlipidemia    Hypertension    Past Surgical History:  Past Surgical History:  Procedure Laterality Date   FOOT FRACTURE SURGERY Right    IR IMAGING GUIDED PORT INSERTION  09/22/2020   IR RADIOLOGIST EVAL & MGMT  09/25/2020   Social History:  Social History   Socioeconomic History   Marital status: Single    Spouse name: Not on file   Number of children: Not on file   Years of education: Not on  file   Highest education level: Not on file  Occupational History   Not on file  Tobacco Use   Smoking status: Former   Smokeless tobacco: Never  Vaping Use   Vaping status: Never Used  Substance and Sexual Activity   Alcohol use: Not Currently   Drug use: Never   Sexual activity: Not on file  Other Topics Concern   Not on file  Social History Narrative   Not on file   Social Determinants of Health   Financial Resource Strain: Not on file  Food Insecurity: No Food Insecurity (06/21/2022)   Hunger Vital Sign    Worried About Running Out of Food in the Last Year: Never true    Ran Out of Food in the Last Year: Never true  Transportation Needs: Unmet Transportation Needs (10/05/2022)   PRAPARE - Administrator, Civil Service (Medical): Yes    Lack of Transportation (Non-Medical): Yes  Physical Activity: Not on file  Stress: Not on file  Social Connections: Not on file  Intimate Partner Violence: Not At Risk (06/21/2022)   Humiliation, Afraid, Rape, and Kick questionnaire    Fear of Current or Ex-Partner: No    Emotionally Abused: No    Physically Abused: No    Sexually Abused: No   Family History:  Family History  Problem  Relation Age of Onset   Cancer Sister 61       Breast   Cancer Niece        Possibly breast    Review of Systems: Constitutional: Doesn't report fevers, chills or abnormal weight loss Eyes: Doesn't report blurriness of vision Ears, nose, mouth, throat, and face: Doesn't report sore throat Respiratory: Doesn't report cough, dyspnea or wheezes Cardiovascular: Doesn't report palpitation, chest discomfort  Gastrointestinal:  Doesn't report nausea, constipation, diarrhea GU: Doesn't report incontinence Skin: Doesn't report skin rashes Neurological: Per HPI Musculoskeletal: Doesn't report joint pain Behavioral/Psych: Doesn't report anxiety  Physical Exam: Wt Readings from Last 3 Encounters:  12/06/22 150 lb 3.2 oz (68.1 kg)  10/26/22  154 lb 11.2 oz (70.2 kg)  10/21/22 154 lb 5.2 oz (70 kg)   Temp Readings from Last 3 Encounters:  12/06/22 98 F (36.7 C) (Oral)  10/26/22 98 F (36.7 C)  10/21/22 97.9 F (36.6 C) (Oral)   BP Readings from Last 3 Encounters:  12/06/22 (!) 172/72  10/26/22 (!) 185/81  10/21/22 (!) 177/86   Pulse Readings from Last 3 Encounters:  12/06/22 69  10/26/22 77  10/21/22 72    KPS: 70. General: Alert, cooperative, pleasant, in no acute distress Head: Normal EENT: No conjunctival injection or scleral icterus.  Lungs: Resp effort normal Cardiac: Regular rate Abdomen: Non-distended abdomen Skin: No rashes cyanosis or petechiae. Extremities: No clubbing or edema  Neurologic Exam: Mental Status: Awake, alert, attentive to examiner. Oriented to self and environment. Language is fluent with intact comprehension.  Cranial Nerves: Visual acuity is grossly normal. Visual fields are full. Extra-ocular movements intact. No ptosis. Face is symmetric Motor: Tone and bulk are normal. 4/5 in right leg, pain limited.  Slight drift in right arm only. Reflexes are symmetric, no pathologic reflexes present.  Sensory: Intact to light touch Gait: Deferred   Labs: I have reviewed the data as listed    Component Value Date/Time   NA 142 10/26/2022 1021   K 3.6 10/26/2022 1021   CL 111 10/26/2022 1021   CO2 28 10/26/2022 1021   GLUCOSE 121 (H) 10/26/2022 1021   BUN 15 10/26/2022 1021   CREATININE 1.44 (H) 10/26/2022 1021   CALCIUM 9.9 10/26/2022 1021   PROT 6.6 10/26/2022 1021   ALBUMIN 3.3 (L) 10/26/2022 1021   AST 52 (H) 10/26/2022 1021   ALT 24 10/26/2022 1021   ALKPHOS 87 10/26/2022 1021   BILITOT 1.0 10/26/2022 1021   GFRNONAA 36 (L) 10/26/2022 1021   GFRAA 39 (L) 06/25/2019 1324   Lab Results  Component Value Date   WBC 5.0 10/26/2022   NEUTROABS 2.4 10/26/2022   HGB 9.3 (L) 10/26/2022   HCT 27.9 (L) 10/26/2022   MCV 77.3 (L) 10/26/2022   PLT 126 (L) 10/26/2022      Assessment/Plan No diagnosis found.  Christy [name] presents today with clinical and radiographic syndrome consistent with chronic bilateral internal capsule infarct, left worse than right.  Prior relevant workup includes brain MRI, EKG, echocardiogram, all reviewed.  Risk factors include age, hypertension, diabetes, cancer syndrome.    Episodes of right sided weakness were likely fluctuating lacunar syndrome or stereotypic TIAs.  She had been non-compliant with Aspirin, statin.  Recommended continuing full dose ASA 325mg  if tolerated, atorvastatin 40mg  daily.    She is agreeable to making an appointment with her PCP later this week to manage her blood pressure, which is elevated today.  She is unclear if/what she is dosing  for hypertension at this time.  Will cont on Kadcyla monotherapy for now with Dr. Mosetta Putt.  We appreciate the opportunity to participate in the care of Christy [name]. We ask that Christy [name] return to clinic as needed.  The total time spent in the encounter was 30 minutes and more than 50% was on counseling and review of test results   Henreitta Leber, MD Medical Director of Neuro-Oncology [facility] 12/06/22 2:00 PM

## 2022-12-06 NOTE — Assessment & Plan Note (Signed)
[id number] stage IV, grade 2-3, ER-/PR- HER2+, with nodal and pulmonary metastases -She initially self palpated a left breast mass around 06/2020. Work-up showed 2 masses in the left breast UOQ and 3 abnormal lymph nodes. 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 -PET scan 08/28/20 showed: hypermetabolism to left breast and axilla, retropectoral and supraclavicular adenopathy, and several 3-6 mm pulmonary nodules. -She started first-line Kadcyla on 09/11/20. She tolerates well with some fatigue, dose decreased with C4.  -She has had good clinical response, breast mass was no longer palpable on physical exam 06/09/21.  -last PET scan in 01/2022 showed two small lung nodule (5-24mm), slightly bigger since last scan, no other hypermetabolic lesions.   -Her restaging CT scan from June 17, 2022 showed multiple enlarging lung nodules, largest measuring 1 cm, previously 0.6 cm.  This is highly concerning for pulmonary metastasis with slight disease progression. No other new lesions -she tried tucatinib 150mg  twice daily but did not tolerate with excessive fatigue and drowsiness -she restarted Tucatinib at 50mg  bid last week , she is tolerating well -I increased Tucatinib to 100mg  bid on 5/29  -She did not tolerate well, with more fatigue, and unstable gait.  She also developed worsening renal function, I stopped Tucatinib on 08/15/2022 -She is clinically stable, renal function has returned to her baseline, but no improvement in her fatigue and gait.  Will continue holding tucatinib now and continue Kadcyla  -repeated CT from 10/03/2022 showed mild progression in lung mets (comparing to three previous scans), but overall low tumor burden and no additional new mets. So we decided to continue Kadcyla and monitor her disease closely  -She again had a multiple falls lately, and more fatigued.  We discussed a chemo break.  Chemo held after treatment on 10/05/2022

## 2022-12-06 NOTE — Progress Notes (Signed)
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 Work-up showed 2 masses in the left breast UOQ and 3 abnormal lymph nodes. 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 -PET scan 08/28/20 showed: hypermetabolism to left breast and axilla, retropectoral and supraclavicular adenopathy, and several 3-6 mm pulmonary nodules. -She started first-line Kadcyla on 09/11/20. She tolerates well with some fatigue, dose decreased with C4.  -She has had good clinical response, breast mass was no longer palpable on physical exam 06/09/21.  -last PET scan in 01/2022 showed two small lung nodule (5-76mm), slightly bigger since last scan, no other hypermetabolic lesions.   -Her restaging CT scan from June 17, 2022 showed multiple enlarging lung nodules, largest measuring 1 cm, previously 0.6 cm.  This is highly concerning for pulmonary metastasis with slight disease progression. No other new lesions -she tried tucatinib 150mg  twice daily but did not tolerate with excessive fatigue and drowsiness -she restarted Tucatinib at 50mg  bid last week , she is tolerating well -I increased Tucatinib to 100mg  bid on 5/29  -She did not tolerate well, with more fatigue, and unstable gait.  She also developed worsening renal function, I stopped Tucatinib on 08/15/2022 -She is clinically stable, renal function has returned to her baseline, but no improvement in her fatigue and gait.  Will continue  holding tucatinib now and continue Kadcyla  -repeated CT from 10/03/2022 showed mild progression in lung mets (comparing to three previous scans), but overall low tumor burden and no additional new mets. So we decided to continue Kadcyla and monitor her disease closely  -She again had a multiple falls lately, and more fatigued.  We discussed a chemo break.  Chemo held after treatment on 10/05/2022    Assessment and Plan    Metastatic Breast Cancer Patient has been on a chemotherapy break for the past two months. Last CT scan on July 30th showed spots in the lung, which may be causing the patient's dry cough. No significant changes noted since discontinuation of treatment. -Resume cancer treatment with Kadcyla -Schedule a CT scan to assess current status of cancer.  Anemia Patient reports cold hands, which may be due to anemia. -Monitor anemia.  Frequent falls Likely related to her overall weakness, previous TIA, cancer and chemo -Continue using walker and take fall precautions  General Health Maintenance -Schedule follow-up visit in three weeks after next treatment. -Refer patient to Dr. Darryl Nestle, a neurologist, for evaluation due to a history of falls and stroke.      Plan -Will restart Kadcyla next week and continue every 3 weeks -Obtain a restaging CT chest without contrast in next few weeks -I spoke with her daughter today.   SUMMARY OF ONCOLOGIC HISTORY: Oncology History Overview Note  Cancer Staging Malignant neoplasm of upper-outer quadrant of female breast [facility]) Staging form: Breast, AJCC 8th Edition - Clinical stage from 07/31/2020: Stage IV (cT2, cN3, cM1, G2, ER-, PR-, HER2+) - Signed by Pollyann Samples, NP on 08/31/2020 Stage prefix: Initial diagnosis Nuclear  grade: G2 Histologic grading system: 3 grade system    Malignant neoplasm of upper-outer quadrant of female breast (HCC)  07/24/2020 Breast US   On physical exam, a very firm mass is identified at the  1:30 position of the LEFT breast 5 cm from the nipple.   IMPRESSION: 1. Highly suspicious 4.3 cm mass at the 1:30 position of the LEFT breast and highly suspicious 2.7 cm mass at the 2 o'clock position of the LEFT breast, with the 2 masses encompassing an area measuring at least 6.1 cm. 2. 3 abnormal LEFT axillary lymph nodes with cortical thickening, tissue sampling of 1 of these lymph nodes recommended. 3. Anterior LEFT breast skin thickening nonspecific but may reflect dermal involvement. 4. No mammographic evidence of RIGHT breast malignancy.   07/31/2020 Initial Biopsy   Diagnosis 1. Breast, left, needle core biopsy, 1 :30 pm, 5cmfn - INVASIVE DUCTAL CARCINOMA, SEE COMMENT. 2. Breast, left, needle core biopsy, 2 o'clock, 8cmfn - INVASIVE DUCTAL CARCINOMA, SEE COMMENT. - DUCTAL CARCINOMA IN SITU. 3. Lymph node, needle/core biopsy, left axillary - METASTATIC CARCINOMA IN A LYMPH NODE. Microscopic Comment 1. and 3. The carcinoma in both parts has a similar morphology and appears grade 2-3. The DCIS in part 2 is high-grade with necrosis. The carcinoma measures 15 mm (part 1) and 14 mm (part 2) in greatest linear extent  2. PROGNOSTIC INDICATORS Results: IMMUNOHISTOCHEMICAL AND MORPHOMETRIC ANALYSIS PERFORMED MANUALLY The tumor cells are POSITIVE for Her2 (3+). Estrogen Receptor: 0%, NEGATIVE Progesterone Receptor: 0%, NEGATIVE Proliferation Marker Ki67: 20%    07/31/2020 Cancer Staging   Staging form: Breast, AJCC 8th Edition - Clinical stage from 07/31/2020: Stage IV (cT2, cN3, cM1, G2, ER-, PR-, HER2+) - Signed by Pollyann Samples, NP on 08/31/2020 Stage prefix: Initial diagnosis Nuclear grade: G2 Histologic grading system: 3 grade system   08/13/2020 Initial Diagnosis   Malignant neoplasm of upper-outer quadrant of female breast (HCC)   08/28/2020 PET scan   IMPRESSION: Signs of LEFT breast cancer with LEFT axillary and retropectoral adenopathy as well as LEFT  supraclavicular adenopathy.   Signs of pulmonary metastatic disease.   Mild asymmetric uptake in the LEFT as compared to the RIGHT adrenal gland. Equivocal but suspicious, attention on follow-up.   Aortic atherosclerosis.   Cystic changes and potential bronchiectasis in the RIGHT lung base likely post infectious or inflammatory. Correlate with any respiratory symptoms. Comparison with prior imaging may be helpful with attention on follow-up.   08/28/2020 Imaging   BRAIN MRI IMPRESSION: 1. Subacute lacunar infarct of the left basal ganglia with no malignant hemorrhagic transformation or mass effect. Underlying chronic bilateral basal ganglia ischemia. 2. No metastatic disease or other acute intracranial abnormality identified.   08/28/2020 Imaging   Breast MRI   IMPRESSION: 1. 8.1 x 8.1 x 5.1 cm area of biopsy-proven malignancy in the central, upper outer and lower outer quadrants of the left breast. 2. Diffuse anterior left breast skin thickening without abnormal enhancement. This may represent thickening due to lymphedema associated with lymphatic obstruction. 3. Metastatic level 1 and level 2 left axillary adenopathy. 4. No evidence of malignancy on the right.   09/11/2020 - 11/03/2021 Chemotherapy   Patient is on Treatment Plan : BREAST ADO-Trastuzumab Emtansine (Kadcyla) q21d     09/11/2020 -  Chemotherapy   Patient is on Treatment Plan : BREAST ADO-Trastuzumab Emtansine (Kadcyla) q21d     11/30/2020 Imaging   CT Chest w/o contrast  IMPRESSION: 1. Interval response to therapy. The left breast mass  has mildly decreased in size in the interval. 2. Decrease in size of left axillary and left retropectoral lymph nodes. 3. Small right lung nodules stable to improved. 4. Coronary artery calcifications noted. 5. Aortic Atherosclerosis (ICD10-I70.0).   03/04/2021 PET scan   IMPRESSION: 1. Overall marked improvement compared to the prior PET-CT, with resolution of hypermetabolic  activity and pathologic enlargement of the previously involved lymph nodes; lack of hypermetabolic activity and nonvisualization of prior right-sided lung nodules, and substantial reduction in size and activity in the left breast primary. 2. New ground-glass opacity medially in the right lower lobe with low-grade metabolic activity, highly likely to be inflammatory (alveolitis) given the morphology and appearance. 3. Other imaging findings of potential clinical significance: Aortic Atherosclerosis (ICD10-I70.0). Coronary atherosclerosis. Mitral valve calcifications. Punctate nonobstructive left renal calculi. Lower lumbar impingement due to chronic spondylosis and degenerative disc disease. Remote left basal ganglia infarct.   06/07/2021 Imaging   EXAM: CT CHEST, ABDOMEN AND PELVIS WITHOUT CONTRAST  IMPRESSION: 1. Scattered tiny bilateral pulmonary nodules are similar to the prior exams, favored to be benign. 2. Otherwise, no evidence of metastatic disease within the chest, abdomen, or pelvis. 3. Right lower lobe ground-glass nodule has resolved since the prior PET 4.  Possible constipation. 5. Coronary artery atherosclerosis. Aortic Atherosclerosis (ICD10-I70.0). Emphysema (ICD10-J43.9). 6. Left nephrolithiasis.   06/15/2022 Imaging    IMPRESSION: 1. Mild increase in size of bilateral pulmonary nodules compatible with progression of metastatic disease. 2. No evidence for metastatic disease within the abdomen or pelvis. 3. Large volume of desiccated stool identified within the rectum. Correlate for any clinical signs or symptoms of constipation. 4. Nonobstructing left renal calculus. 5. Aortic Atherosclerosis (ICD10-I70.0).     10/03/2022 Imaging    IMPRESSION: 1. Mild but definite progression of pulmonary metastasis. 2. No extrathoracic metastatic disease identified. 3. Incidental findings, including: Left nephrolithiasis. Coronary artery atherosclerosis. Aortic Atherosclerosis  (ICD10-I70.0). Possible constipation.      Discussed the use of AI scribe software for clinical note transcription with the patient, who gave verbal consent to proceed.  History of Present Illness   The patient, an 82 year old female with metastatic breast cancer, presents for a follow-up visit after a two-month break from chemotherapy. She reports feeling fatigued and requiring more sleep. She has lost five pounds but reports eating well. She denies any falls but admits to bumping into things and needing to grab onto walls for support. She reports having cold hands, which may be due to anemia. She also reports a new onset of a dry cough that is worse at night. She denies any pain. She also reports difficulty swallowing liquids, including water and soda, which often leads to choking and frequent urination, especially at night.         All other systems were reviewed with the patient and are negative.  MEDICAL HISTORY:  Past Medical History:  Diagnosis Date   Diabetes mellitus without complication (HCC)    Hyperlipidemia    Hypertension     SURGICAL HISTORY: Past Surgical History:  Procedure Laterality Date   FOOT FRACTURE SURGERY Right    IR IMAGING GUIDED PORT INSERTION  09/22/2020   IR RADIOLOGIST EVAL & MGMT  09/25/2020    I have reviewed the social history and family history with the patient and they are unchanged from previous note.  ALLERGIES:  has No Known Allergies.  MEDICATIONS:  Current Outpatient Medications  Medication Sig Dispense Refill   aspirin EC 325 MG tablet Take 1 tablet (325 mg  total) by mouth daily. 60 tablet 3   atorvastatin (LIPITOR) 20 MG tablet Take 1 tablet by mouth once daily 60 tablet 0   ferrous sulfate 325 (65 FE) MG EC tablet Take 1 tablet (325 mg total) by mouth daily with breakfast. 30 tablet 3   lidocaine-prilocaine (EMLA) cream Apply 1 Application topically as needed. 30 g 2   metFORMIN (GLUCOPHAGE) 500 MG tablet Take 1 tablet (500 mg  total) by mouth 2 (two) times daily with a meal. 60 tablet 1   olmesartan (BENICAR) 40 MG tablet      potassium chloride SA (KLOR-CON M) 20 MEQ tablet Take 1 tablet by mouth once daily 30 tablet 0   No current facility-administered medications for this visit.    PHYSICAL EXAMINATION: ECOG PERFORMANCE STATUS: 2 - Symptomatic, <50% confined to bed  Vitals:   12/06/22 1327 12/06/22 1328  BP: (!) 138/117 (!) 172/72  Pulse: 69   Resp: 17   Temp: 98 F (36.7 C)   SpO2: 99%    Wt Readings from Last 3 Encounters:  12/06/22 150 lb 3.2 oz (68.1 kg)  10/26/22 154 lb 11.2 oz (70.2 kg)  10/21/22 154 lb 5.2 oz (70 kg)     GENERAL:alert, no distress and comfortable SKIN: skin color, texture, turgor are normal, no rashes or significant lesions EYES: normal, Conjunctiva are pink and non-injected, sclera clear NECK: supple, thyroid normal size, non-tender, without nodularity LYMPH:  no palpable lymphadenopathy in the cervical, axillary  LUNGS: clear to auscultation and percussion with normal breathing effort HEART: regular rate & rhythm and no murmurs and no lower extremity edema ABDOMEN:abdomen soft, non-tender and normal bowel sounds Musculoskeletal:no cyanosis of digits and no clubbing  NEURO: alert & oriented x 3 with fluent speech, no focal motor/sensory deficits   LABORATORY DATA:  I have reviewed the data as listed    Latest Ref Rng & Units 12/06/2022   12:58 PM 10/26/2022   10:21 AM 10/21/2022    3:40 PM  CBC  WBC 4.0 - 10.5 K/uL 4.6  5.0  4.8   Hemoglobin 12.0 - 15.0 g/dL 16.1  9.3  9.7   Hematocrit 36.0 - 46.0 % 29.9  27.9  29.6   Platelets 150 - 400 K/uL 141  126  120         Latest Ref Rng & Units 12/06/2022   12:58 PM 10/26/2022   10:21 AM 10/21/2022    3:40 PM  CMP  Glucose 70 - 99 mg/dL 096  045  83   BUN 8 - 23 mg/dL 18  15  14    Creatinine 0.44 - 1.00 mg/dL 4.09  8.11  9.14   Sodium 135 - 145 mmol/L 143  142  142   Potassium 3.5 - 5.1 mmol/L 3.8  3.6  3.3    Chloride 98 - 111 mmol/L 111  111  113   CO2 22 - 32 mmol/L 27  28  22    Calcium 8.9 - 10.3 mg/dL 78.2  9.9  95.6   Total Protein 6.5 - 8.1 g/dL 6.8  6.6  6.7   Total Bilirubin 0.3 - 1.2 mg/dL 1.0  1.0  1.2   Alkaline Phos 38 - 126 U/L 76  87  76   AST 15 - 41 U/L 46  52  64   ALT 0 - 44 U/L 25  24  30        RADIOGRAPHIC STUDIES: I have personally reviewed the radiological images as listed and agreed  with the findings in the report. No results found.    Orders Placed This Encounter  Procedures   CT CHEST WO CONTRAST    Standing Status:   Future    Standing Expiration Date:   12/06/2023    Order Specific Question:   Preferred imaging location?    Answer:   [facility]   All questions were answered. The patient knows to call the clinic with any problems, questions or concerns. No barriers to learning was detected. The total time spent in the appointment was 30 minutes.     Malachy Mood, MD 12/06/2022

## 2022-12-07 ENCOUNTER — Ambulatory Visit: Payer: Medicare HMO

## 2022-12-07 ENCOUNTER — Other Ambulatory Visit: Payer: Medicare HMO

## 2022-12-07 ENCOUNTER — Telehealth: Payer: Self-pay | Admitting: Hematology

## 2022-12-07 ENCOUNTER — Ambulatory Visit: Payer: Medicare HMO | Admitting: Hematology

## 2022-12-08 ENCOUNTER — Other Ambulatory Visit: Payer: Self-pay

## 2022-12-12 ENCOUNTER — Other Ambulatory Visit: Payer: Self-pay | Admitting: Hematology

## 2022-12-12 DIAGNOSIS — Z171 Estrogen receptor negative status [ER-]: Secondary | ICD-10-CM

## 2022-12-13 ENCOUNTER — Other Ambulatory Visit: Payer: Self-pay

## 2022-12-13 ENCOUNTER — Inpatient Hospital Stay: Payer: Medicare HMO

## 2022-12-13 VITALS — BP 161/65 | HR 71 | Temp 97.9°F | Resp 17

## 2022-12-13 DIAGNOSIS — C50412 Malignant neoplasm of upper-outer quadrant of left female breast: Secondary | ICD-10-CM

## 2022-12-13 DIAGNOSIS — Z5112 Encounter for antineoplastic immunotherapy: Secondary | ICD-10-CM | POA: Diagnosis not present

## 2022-12-13 MED ORDER — DIPHENHYDRAMINE HCL 25 MG PO CAPS
50.0000 mg | ORAL_CAPSULE | Freq: Once | ORAL | Status: AC
  Administered 2022-12-13: 50 mg via ORAL
  Filled 2022-12-13: qty 2

## 2022-12-13 MED ORDER — SODIUM CHLORIDE 0.9 % IV SOLN
3.0000 mg/kg | Freq: Once | INTRAVENOUS | Status: AC
  Administered 2022-12-13: 200 mg via INTRAVENOUS
  Filled 2022-12-13: qty 10

## 2022-12-13 MED ORDER — ACETAMINOPHEN 325 MG PO TABS
650.0000 mg | ORAL_TABLET | Freq: Once | ORAL | Status: AC
  Administered 2022-12-13: 650 mg via ORAL
  Filled 2022-12-13: qty 2

## 2022-12-13 MED ORDER — HEPARIN SOD (PORK) LOCK FLUSH 100 UNIT/ML IV SOLN
500.0000 [IU] | Freq: Once | INTRAVENOUS | Status: AC | PRN
  Administered 2022-12-13: 500 [IU]

## 2022-12-13 MED ORDER — SODIUM CHLORIDE 0.9% FLUSH
10.0000 mL | INTRAVENOUS | Status: DC | PRN
  Administered 2022-12-13: 10 mL

## 2022-12-13 MED ORDER — SODIUM CHLORIDE 0.9 % IV SOLN: Freq: Once | INTRAVENOUS | Status: AC

## 2022-12-13 NOTE — Patient Instructions (Signed)
[facility]  Discharge Instructions: Thank you for choosing [facility] to provide your oncology and hematology care.   If you have a lab appointment with the Cancer Center, please go directly to the Cancer Center and check in at the registration area.   Wear comfortable clothing and clothing appropriate for easy access to any Portacath or PICC line.   We strive to give you quality time with your provider. You may need to reschedule your appointment if you arrive late (15 or more minutes).  Arriving late affects you and other patients whose appointments are after yours.  Also, if you miss three or more appointments without notifying the office, you may be dismissed from the clinic at the provider's discretion.      For prescription refill requests, have your pharmacy contact our office and allow 72 hours for refills to be completed.    Today you received the following chemotherapy and/or immunotherapy agents: Kadcyla.       To help prevent nausea and vomiting after your treatment, we encourage you to take your nausea medication as directed.  BELOW ARE SYMPTOMS THAT SHOULD BE REPORTED IMMEDIATELY: *FEVER GREATER THAN 100.4 F (38 C) OR HIGHER *CHILLS OR SWEATING *NAUSEA AND VOMITING THAT IS NOT CONTROLLED WITH YOUR NAUSEA MEDICATION *UNUSUAL SHORTNESS OF BREATH *UNUSUAL BRUISING OR BLEEDING *URINARY PROBLEMS (pain or burning when urinating, or frequent urination) *BOWEL PROBLEMS (unusual diarrhea, constipation, pain near the anus) TENDERNESS IN MOUTH AND THROAT WITH OR WITHOUT PRESENCE OF ULCERS (sore throat, sores in mouth, or a toothache) UNUSUAL RASH, SWELLING OR PAIN  UNUSUAL VAGINAL DISCHARGE OR ITCHING   Items with * indicate a potential emergency and should be followed up as soon as possible or go to the Emergency Department if any problems should occur.  Please show the CHEMOTHERAPY ALERT CARD or IMMUNOTHERAPY ALERT CARD at  check-in to the Emergency Department and triage nurse.  Should you have questions after your visit or need to cancel or reschedule your appointment, please contact [facility]  Dept: [phone]  and follow the prompts.  Office hours are 8:00 a.m. to 4:30 p.m. Monday - Friday. Please note that voicemails left after 4:00 p.m. may not be returned until the following business day.  We are closed weekends and major holidays. You have access to a nurse at all times for urgent questions. Please call the main number to the clinic Dept: [phone] and follow the prompts.   For any non-urgent questions, you may also contact your provider using MyChart. We now offer e-Visits for anyone 18 and older to request care online for non-urgent symptoms. For details visit [email or web address].   Also download the MyChart app! Go to the app store, search "MyChart", open the app, select [facility], and log in with your MyChart username and password.   

## 2022-12-13 NOTE — Progress Notes (Signed)
Per Dr Mosetta Putt ok to treat with labs from 10/1 and echo results from 6/26.

## 2022-12-15 ENCOUNTER — Other Ambulatory Visit: Payer: Self-pay

## 2022-12-20 ENCOUNTER — Ambulatory Visit (HOSPITAL_COMMUNITY)
Admission: RE | Admit: 2022-12-20 | Discharge: 2022-12-20 | Disposition: A | Discharge status: Home / Self Care | Payer: Medicare HMO | Source: Ambulatory Visit | Attending: Hematology | Admitting: Hematology

## 2022-12-20 ENCOUNTER — Encounter: Payer: Self-pay | Admitting: Hematology

## 2022-12-20 ENCOUNTER — Inpatient Hospital Stay: Payer: Medicare HMO

## 2022-12-20 DIAGNOSIS — C50412 Malignant neoplasm of upper-outer quadrant of left female breast: Secondary | ICD-10-CM | POA: Diagnosis present

## 2022-12-20 DIAGNOSIS — Z171 Estrogen receptor negative status [ER-]: Secondary | ICD-10-CM | POA: Diagnosis present

## 2022-12-20 NOTE — Telephone Encounter (Signed)
TC

## 2022-12-21 ENCOUNTER — Other Ambulatory Visit: Payer: Self-pay

## 2022-12-24 ENCOUNTER — Other Ambulatory Visit: Payer: Self-pay

## 2022-12-26 ENCOUNTER — Ambulatory Visit (HOSPITAL_COMMUNITY)
Admission: RE | Admit: 2022-12-26 | Discharge: 2022-12-26 | Disposition: A | Discharge status: Home / Self Care | Payer: Medicare HMO | Source: Ambulatory Visit | Attending: Hematology | Admitting: Hematology

## 2022-12-26 ENCOUNTER — Inpatient Hospital Stay: Payer: Medicare HMO

## 2022-12-26 DIAGNOSIS — E119 Type 2 diabetes mellitus without complications: Secondary | ICD-10-CM | POA: Diagnosis not present

## 2022-12-26 DIAGNOSIS — I10 Essential (primary) hypertension: Secondary | ICD-10-CM | POA: Diagnosis not present

## 2022-12-26 DIAGNOSIS — F17200 Nicotine dependence, unspecified, uncomplicated: Secondary | ICD-10-CM | POA: Insufficient documentation

## 2022-12-26 DIAGNOSIS — Z171 Estrogen receptor negative status [ER-]: Secondary | ICD-10-CM | POA: Insufficient documentation

## 2022-12-26 DIAGNOSIS — E785 Hyperlipidemia, unspecified: Secondary | ICD-10-CM | POA: Insufficient documentation

## 2022-12-26 DIAGNOSIS — Z8673 Personal history of transient ischemic attack (TIA), and cerebral infarction without residual deficits: Secondary | ICD-10-CM | POA: Insufficient documentation

## 2022-12-26 DIAGNOSIS — C50412 Malignant neoplasm of upper-outer quadrant of left female breast: Secondary | ICD-10-CM | POA: Insufficient documentation

## 2022-12-26 DIAGNOSIS — Z0189 Encounter for other specified special examinations: Secondary | ICD-10-CM | POA: Diagnosis not present

## 2022-12-26 DIAGNOSIS — I358 Other nonrheumatic aortic valve disorders: Secondary | ICD-10-CM | POA: Insufficient documentation

## 2022-12-26 LAB — ECHOCARDIOGRAM COMPLETE
AR max vel: 2.86 cm2
AV Area VTI: 2.6 cm2
AV Area mean vel: 2.61 cm2
AV Mean grad: 4 mm[Hg]
AV Peak grad: 7.2 mm[Hg]
Ao pk vel: 1.34 m/s
Area-P 1/2: 3.21 cm2
S' Lateral: 3 cm

## 2022-12-26 NOTE — Progress Notes (Signed)
*  PRELIMINARY RESULTS* Echocardiogram 2D Echocardiogram has been performed.  Christy [name] 12/26/2022, 11:33 AM

## 2022-12-28 ENCOUNTER — Ambulatory Visit: Payer: Medicare HMO

## 2022-12-28 ENCOUNTER — Other Ambulatory Visit: Payer: Medicare HMO

## 2022-12-28 ENCOUNTER — Ambulatory Visit: Payer: Medicare HMO | Admitting: Nurse Practitioner

## 2023-01-01 NOTE — Progress Notes (Unsigned)
Patient Care Team: [name], MD as PCP - General (Internal Medicine) Malachy Mood, MD as Consulting Physician (Medical Oncology)   CHIEF COMPLAINT: Follow up metastatic breat cancer   Oncology History Overview Note  Cancer Staging Malignant neoplasm of upper-outer quadrant of female breast [facility]) Staging form: Breast, AJCC 8th Edition - Clinical stage from 07/31/2020: Stage IV (cT2, cN3, cM1, G2, ER-, PR-, HER2+) - Signed by Pollyann Samples, NP on 08/31/2020 Stage prefix: Initial diagnosis Nuclear grade: G2 Histologic grading system: 3 grade system    Malignant neoplasm of upper-outer quadrant of female breast (HCC)  07/24/2020 Breast US   On physical exam, a very firm mass is identified at the 1:30 position of the LEFT breast 5 cm from the nipple.   IMPRESSION: 1. Highly suspicious 4.3 cm mass at the 1:30 position of the LEFT breast and highly suspicious 2.7 cm mass at the 2 o'clock position of the LEFT breast, with the 2 masses encompassing an area measuring at least 6.1 cm. 2. 3 abnormal LEFT axillary lymph nodes with cortical thickening, tissue sampling of 1 of these lymph nodes recommended. 3. Anterior LEFT breast skin thickening nonspecific but may reflect dermal involvement. 4. No mammographic evidence of RIGHT breast malignancy.   07/31/2020 Initial Biopsy   Diagnosis 1. Breast, left, needle core biopsy, 1 :30 pm, 5cmfn - INVASIVE DUCTAL CARCINOMA, SEE COMMENT. 2. Breast, left, needle core biopsy, 2 o'clock, 8cmfn - INVASIVE DUCTAL CARCINOMA, SEE COMMENT. - DUCTAL CARCINOMA IN SITU. 3. Lymph node, needle/core biopsy, left axillary - METASTATIC CARCINOMA IN A LYMPH NODE. Microscopic Comment 1. and 3. The carcinoma in both parts has a similar morphology and appears grade 2-3. The DCIS in part 2 is high-grade with necrosis. The carcinoma measures 15 mm (part 1) and 14 mm (part 2) in greatest linear extent  2. PROGNOSTIC INDICATORS Results: IMMUNOHISTOCHEMICAL AND  MORPHOMETRIC ANALYSIS PERFORMED MANUALLY The tumor cells are POSITIVE for Her2 (3+). Estrogen Receptor: 0%, NEGATIVE Progesterone Receptor: 0%, NEGATIVE Proliferation Marker Ki67: 20%    07/31/2020 Cancer Staging   Staging form: Breast, AJCC 8th Edition - Clinical stage from 07/31/2020: Stage IV (cT2, cN3, cM1, G2, ER-, PR-, HER2+) - Signed by Pollyann Samples, NP on 08/31/2020 Stage prefix: Initial diagnosis Nuclear grade: G2 Histologic grading system: 3 grade system   08/13/2020 Initial Diagnosis   Malignant neoplasm of upper-outer quadrant of female breast (HCC)   08/28/2020 PET scan   IMPRESSION: Signs of LEFT breast cancer with LEFT axillary and retropectoral adenopathy as well as LEFT supraclavicular adenopathy.   Signs of pulmonary metastatic disease.   Mild asymmetric uptake in the LEFT as compared to the RIGHT adrenal gland. Equivocal but suspicious, attention on follow-up.   Aortic atherosclerosis.   Cystic changes and potential bronchiectasis in the RIGHT lung base likely post infectious or inflammatory. Correlate with any respiratory symptoms. Comparison with prior imaging may be helpful with attention on follow-up.   08/28/2020 Imaging   BRAIN MRI IMPRESSION: 1. Subacute lacunar infarct of the left basal ganglia with no malignant hemorrhagic transformation or mass effect. Underlying chronic bilateral basal ganglia ischemia. 2. No metastatic disease or other acute intracranial abnormality identified.   08/28/2020 Imaging   Breast MRI   IMPRESSION: 1. 8.1 x 8.1 x 5.1 cm area of biopsy-proven malignancy in the central, upper outer and lower outer quadrants of the left breast. 2. Diffuse anterior left breast skin thickening without abnormal enhancement. This may represent thickening due to lymphedema associated with lymphatic  obstruction. 3. Metastatic level 1 and level 2 left axillary adenopathy. 4. No evidence of malignancy on the right.   09/11/2020 - 11/03/2021  Chemotherapy   Patient is on Treatment Plan : BREAST ADO-Trastuzumab Emtansine (Kadcyla) q21d     09/11/2020 -  Chemotherapy   Patient is on Treatment Plan : BREAST ADO-Trastuzumab Emtansine (Kadcyla) q21d     11/30/2020 Imaging   CT Chest w/o contrast  IMPRESSION: 1. Interval response to therapy. The left breast mass has mildly decreased in size in the interval. 2. Decrease in size of left axillary and left retropectoral lymph nodes. 3. Small right lung nodules stable to improved. 4. Coronary artery calcifications noted. 5. Aortic Atherosclerosis (ICD10-I70.0).   03/04/2021 PET scan   IMPRESSION: 1. Overall marked improvement compared to the prior PET-CT, with resolution of hypermetabolic activity and pathologic enlargement of the previously involved lymph nodes; lack of hypermetabolic activity and nonvisualization of prior right-sided lung nodules, and substantial reduction in size and activity in the left breast primary. 2. New ground-glass opacity medially in the right lower lobe with low-grade metabolic activity, highly likely to be inflammatory (alveolitis) given the morphology and appearance. 3. Other imaging findings of potential clinical significance: Aortic Atherosclerosis (ICD10-I70.0). Coronary atherosclerosis. Mitral valve calcifications. Punctate nonobstructive left renal calculi. Lower lumbar impingement due to chronic spondylosis and degenerative disc disease. Remote left basal ganglia infarct.   06/07/2021 Imaging   EXAM: CT CHEST, ABDOMEN AND PELVIS WITHOUT CONTRAST  IMPRESSION: 1. Scattered tiny bilateral pulmonary nodules are similar to the prior exams, favored to be benign. 2. Otherwise, no evidence of metastatic disease within the chest, abdomen, or pelvis. 3. Right lower lobe ground-glass nodule has resolved since the prior PET 4.  Possible constipation. 5. Coronary artery atherosclerosis. Aortic Atherosclerosis (ICD10-I70.0). Emphysema (ICD10-J43.9). 6.  Left nephrolithiasis.   06/15/2022 Imaging    IMPRESSION: 1. Mild increase in size of bilateral pulmonary nodules compatible with progression of metastatic disease. 2. No evidence for metastatic disease within the abdomen or pelvis. 3. Large volume of desiccated stool identified within the rectum. Correlate for any clinical signs or symptoms of constipation. 4. Nonobstructing left renal calculus. 5. Aortic Atherosclerosis (ICD10-I70.0).     10/03/2022 Imaging    IMPRESSION: 1. Mild but definite progression of pulmonary metastasis. 2. No extrathoracic metastatic disease identified. 3. Incidental findings, including: Left nephrolithiasis. Coronary artery atherosclerosis. Aortic Atherosclerosis (ICD10-I70.0). Possible constipation.      CURRENT THERAPY: Kadcyla, held since 10/05/22 due to falls, weakness/chemo break and restarted 12/13/22.   INTERVAL HISTORY Christy [name] returns for follow up as scheduled. Last seen by Dr. Mosetta Putt 12/06/22. She restarted Kadcyla 10/8. CT chest 10/15 for new baseline/restaging showed progression of the pulmonary nodules   ROS   Past Medical History:  Diagnosis Date   Diabetes mellitus without complication (HCC)    Hyperlipidemia    Hypertension      Past Surgical History:  Procedure Laterality Date   FOOT FRACTURE SURGERY Right    IR IMAGING GUIDED PORT INSERTION  09/22/2020   IR RADIOLOGIST EVAL & MGMT  09/25/2020     Outpatient Encounter Medications as of 01/04/2023  Medication Sig   aspirin EC 325 MG tablet Take 1 tablet (325 mg total) by mouth daily.   atorvastatin (LIPITOR) 20 MG tablet Take 1 tablet by mouth once daily   ferrous sulfate 325 (65 FE) MG EC tablet Take 1 tablet (325 mg total) by mouth daily with breakfast.   lidocaine-prilocaine (EMLA) cream Apply 1 Application topically as  needed.   metFORMIN (GLUCOPHAGE) 500 MG tablet Take 1 tablet (500 mg total) by mouth 2 (two) times daily with a meal.   olmesartan (BENICAR) 40 MG tablet     potassium chloride SA (KLOR-CON M) 20 MEQ tablet Take 1 tablet by mouth once daily   No facility-administered encounter medications on file as of 01/04/2023.     There were no vitals filed for this visit. There is no height or weight on file to calculate BMI.   PHYSICAL EXAM GENERAL:alert, no distress and comfortable SKIN: no rash  EYES: sclera clear NECK: without mass LYMPH:  no palpable cervical or supraclavicular lymphadenopathy  LUNGS: clear with normal breathing effort HEART: regular rate & rhythm, no lower extremity edema ABDOMEN: abdomen soft, non-tender and normal bowel sounds NEURO: alert & oriented x 3 with fluent speech, no focal motor/sensory deficits Breast exam:  PAC without erythema    CBC    Component Value Date/Time   WBC 4.6 12/06/2022 1258   WBC 4.8 10/21/2022 1540   RBC 3.95 12/06/2022 1258   HGB 10.4 (L) 12/06/2022 1258   HCT 29.9 (L) 12/06/2022 1258   PLT 141 (L) 12/06/2022 1258   MCV 75.7 (L) 12/06/2022 1258   MCH 26.3 12/06/2022 1258   MCHC 34.8 12/06/2022 1258   RDW 16.5 (H) 12/06/2022 1258   LYMPHSABS 2.0 12/06/2022 1258   MONOABS 0.4 12/06/2022 1258   EOSABS 0.3 12/06/2022 1258   BASOSABS 0.0 12/06/2022 1258     CMP     Component Value Date/Time   NA 143 12/06/2022 1258   K 3.8 12/06/2022 1258   CL 111 12/06/2022 1258   CO2 27 12/06/2022 1258   GLUCOSE 101 (H) 12/06/2022 1258   BUN 18 12/06/2022 1258   CREATININE 1.48 (H) 12/06/2022 1258   CALCIUM 10.5 (H) 12/06/2022 1258   PROT 6.8 12/06/2022 1258   ALBUMIN 3.6 12/06/2022 1258   AST 46 (H) 12/06/2022 1258   ALT 25 12/06/2022 1258   ALKPHOS 76 12/06/2022 1258   BILITOT 1.0 12/06/2022 1258   GFRNONAA 35 (L) 12/06/2022 1258   GFRAA 39 (L) 06/25/2019 1324     ASSESSMENT & PLAN:Christy [name] is a 82 y.o. female with    Malignant neoplasm of upper-outer quadrant of female breast; cT2N3M1 stage IV, grade 2-3, ER-/PR- HER2+, with nodal and pulmonary metastases -She initially self  palpated a left breast mass around 06/2020. Work-up showed 2 masses in the left breast UOQ and 3 abnormal lymph nodes. 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 -PET scan 08/28/20 showed: hypermetabolism to left breast and axilla, retropectoral and supraclavicular adenopathy, and several 3-6 mm pulmonary nodules. -She started first-line Kadcyla on 09/11/20. She tolerates well with some fatigue, dose decreased with C4.  -She has had good clinical response, breast mass was no longer palpable on physical exam 06/09/21.  -last PET scan in 01/2022 showed two small lung nodule (5-34mm), slightly bigger since last scan, no other hypermetabolic lesions.   -Her restaging CT scan from June 17, 2022 showed multiple enlarging lung nodules, largest measuring 1 cm, previously 0.6 cm.  This is highly concerning for pulmonary metastasis with slight disease progression. No other new lesions -Dr. Mosetta Putt and pt/family discussed options, including treatment vs palliative care or hospice. Family wished to try more treatment  -Based on the [id number] trial, will continue Kadcyla, and oral Tucatinib.  -D/c'd Tucatinib in 08/2022 due to fatigue and poor tolerance even with low dose -Continued Kadcyla until  treatment breast after 10/05/22, restarted 12/13/22 -CT Chest    2. Vaginal spotting -? Secondary to restarting Aspirin 325 mg -Hold aspirin until bleeding resolves. Then start Aspirin 81 mg     PLAN:  No orders of the defined types were placed in this encounter.     All questions were answered. The patient knows to call the clinic with any problems, questions or concerns. No barriers to learning were detected. I spent *** counseling the patient face to face. The total time spent in the appointment was *** and more than 50% was on counseling, review of test results, and coordination of care.   Santiago Glad, NP-C @DATE @

## 2023-01-03 ENCOUNTER — Other Ambulatory Visit: Payer: Self-pay

## 2023-01-04 ENCOUNTER — Inpatient Hospital Stay: Payer: Medicare HMO

## 2023-01-04 ENCOUNTER — Encounter: Payer: Self-pay | Admitting: Nurse Practitioner

## 2023-01-04 ENCOUNTER — Encounter: Payer: Self-pay | Admitting: Hematology

## 2023-01-04 ENCOUNTER — Inpatient Hospital Stay (HOSPITAL_BASED_OUTPATIENT_CLINIC_OR_DEPARTMENT_OTHER): Payer: Medicare HMO | Admitting: Nurse Practitioner

## 2023-01-04 VITALS — BP 176/76 | HR 65 | Temp 98.2°F | Resp 17 | Ht 64.0 in | Wt 152.4 lb

## 2023-01-04 DIAGNOSIS — Z5112 Encounter for antineoplastic immunotherapy: Secondary | ICD-10-CM | POA: Diagnosis not present

## 2023-01-04 DIAGNOSIS — Z171 Estrogen receptor negative status [ER-]: Secondary | ICD-10-CM

## 2023-01-04 DIAGNOSIS — C50412 Malignant neoplasm of upper-outer quadrant of left female breast: Secondary | ICD-10-CM

## 2023-01-04 LAB — COMPREHENSIVE METABOLIC PANEL
ALT: 24 U/L (ref 0–44)
AST: 44 U/L — ABNORMAL HIGH (ref 15–41)
Albumin: 3.3 g/dL — ABNORMAL LOW (ref 3.5–5.0)
Alkaline Phosphatase: 89 U/L (ref 38–126)
Anion gap: 6 (ref 5–15)
BUN: 19 mg/dL (ref 8–23)
CO2: 26 mmol/L (ref 22–32)
Calcium: 9.9 mg/dL (ref 8.9–10.3)
Chloride: 112 mmol/L — ABNORMAL HIGH (ref 98–111)
Creatinine, Ser: 1.48 mg/dL — ABNORMAL HIGH (ref 0.44–1.00)
GFR, Estimated: 35 mL/min — ABNORMAL LOW (ref >60)
Glucose, Bld: 84 mg/dL (ref 70–99)
Potassium: 3.9 mmol/L (ref 3.5–5.1)
Sodium: 144 mmol/L (ref 135–145)
Total Bilirubin: 0.9 mg/dL (ref 0.3–1.2)
Total Protein: 6.6 g/dL (ref 6.5–8.1)

## 2023-01-04 LAB — CBC WITH DIFFERENTIAL (CANCER CENTER ONLY)
Abs Immature Granulocytes: 0.01 10*3/uL (ref 0.00–0.07)
Basophils Absolute: 0 10*3/uL (ref 0.0–0.1)
Basophils Relative: 1 %
Eosinophils Absolute: 0.2 10*3/uL (ref 0.0–0.5)
Eosinophils Relative: 6 %
HCT: 27.6 % — ABNORMAL LOW (ref 36.0–46.0)
Hemoglobin: 9.6 g/dL — ABNORMAL LOW (ref 12.0–15.0)
Immature Granulocytes: 0 %
Lymphocytes Relative: 42 %
Lymphs Abs: 1.9 10*3/uL (ref 0.7–4.0)
MCH: 26.2 pg (ref 26.0–34.0)
MCHC: 34.8 g/dL (ref 30.0–36.0)
MCV: 75.4 fL — ABNORMAL LOW (ref 80.0–100.0)
Monocytes Absolute: 0.4 10*3/uL (ref 0.1–1.0)
Monocytes Relative: 9 %
Neutro Abs: 1.8 10*3/uL (ref 1.7–7.7)
Neutrophils Relative %: 42 %
Platelet Count: 123 10*3/uL — ABNORMAL LOW (ref 150–400)
RBC: 3.66 MIL/uL — ABNORMAL LOW (ref 3.87–5.11)
RDW: 16.6 % — ABNORMAL HIGH (ref 11.5–15.5)
WBC Count: 4.4 10*3/uL (ref 4.0–10.5)
nRBC: 0 % (ref 0.0–0.2)

## 2023-01-04 MED ORDER — DIPHENHYDRAMINE HCL 25 MG PO CAPS
50.0000 mg | ORAL_CAPSULE | Freq: Once | ORAL | Status: AC
  Administered 2023-01-04: 50 mg via ORAL
  Filled 2023-01-04: qty 2

## 2023-01-04 MED ORDER — DEXAMETHASONE 4 MG PO TABS: ORAL_TABLET | ORAL | 1 refills | Status: DC

## 2023-01-04 MED ORDER — LIDOCAINE-PRILOCAINE 2.5-2.5 % EX CREA: TOPICAL_CREAM | CUTANEOUS | 3 refills | Status: DC

## 2023-01-04 MED ORDER — ASPIRIN 325 MG PO TBEC: 325.0000 mg | DELAYED_RELEASE_TABLET | Freq: Every day | ORAL | 3 refills | Status: DC

## 2023-01-04 MED ORDER — SODIUM CHLORIDE 0.9 % IV SOLN
3.0000 mg/kg | Freq: Once | INTRAVENOUS | Status: AC
  Administered 2023-01-04: 200 mg via INTRAVENOUS
  Filled 2023-01-04: qty 10

## 2023-01-04 MED ORDER — ONDANSETRON HCL 8 MG PO TABS: 8.0000 mg | ORAL_TABLET | Freq: Three times a day (TID) | ORAL | 1 refills | Status: DC | PRN

## 2023-01-04 MED ORDER — ACETAMINOPHEN 325 MG PO TABS
650.0000 mg | ORAL_TABLET | Freq: Once | ORAL | Status: AC
  Administered 2023-01-04: 650 mg via ORAL
  Filled 2023-01-04: qty 2

## 2023-01-04 MED ORDER — HEPARIN SOD (PORK) LOCK FLUSH 100 UNIT/ML IV SOLN
500.0000 [IU] | Freq: Once | INTRAVENOUS | Status: AC | PRN
  Administered 2023-01-04: 500 [IU]

## 2023-01-04 MED ORDER — OLMESARTAN MEDOXOMIL 40 MG PO TABS: 40.0000 mg | ORAL_TABLET | Freq: Every day | ORAL | 0 refills | Status: DC

## 2023-01-04 MED ORDER — SODIUM CHLORIDE 0.9% FLUSH
10.0000 mL | INTRAVENOUS | Status: DC | PRN
  Administered 2023-01-04: 10 mL

## 2023-01-04 MED ORDER — PROCHLORPERAZINE MALEATE 10 MG PO TABS: 10.0000 mg | ORAL_TABLET | Freq: Four times a day (QID) | ORAL | 1 refills | Status: DC | PRN

## 2023-01-04 MED ORDER — SODIUM CHLORIDE 0.9 % IV SOLN: Freq: Once | INTRAVENOUS | Status: AC

## 2023-01-04 NOTE — Progress Notes (Signed)
DISCONTINUE OFF PATHWAY REGIMEN - Breast   [id number]:Ado-Trastuzumab Emtansine 3.6 mg/kg IV D1 q21 Days:   A cycle is every 21 days:     Ado-trastuzumab emtansine   **Always confirm dose/schedule in your pharmacy ordering system**  REASON: Disease Progression PRIOR TREATMENT: Off Pathway: Ado-Trastuzumab Emtansine 3.6 mg/kg IV D1 q21 Days TREATMENT RESPONSE: Partial Response (PR)  START ON PATHWAY REGIMEN - Breast     A cycle is every 21 days:     Fam-trastuzumab deruxtecan-nxki   **Always confirm dose/schedule in your pharmacy ordering system**  Patient Characteristics: Distant Metastases or Locoregional Recurrent Disease - Unresected, M0 or Locally Advanced Unresectable Disease Progressing after Neoadjuvant and Local Therapies, M0, HER2 Positive, ER Negative, Chemotherapy, Second Line Therapeutic Status: Distant Metastases HER2 Status: Positive (+) ER Status: Negative (-) PR Status: Negative (-) Line of Therapy: Second Line Intent of Therapy: Non-Curative / Palliative Intent, Discussed with Patient

## 2023-01-04 NOTE — Patient Instructions (Signed)
[facility]  Discharge Instructions: Thank you for choosing [facility] to provide your oncology and hematology care.   If you have a lab appointment with the Cancer Center, please go directly to the Cancer Center and check in at the registration area.   Wear comfortable clothing and clothing appropriate for easy access to any Portacath or PICC line.   We strive to give you quality time with your provider. You may need to reschedule your appointment if you arrive late (15 or more minutes).  Arriving late affects you and other patients whose appointments are after yours.  Also, if you miss three or more appointments without notifying the office, you may be dismissed from the clinic at the provider's discretion.      For prescription refill requests, have your pharmacy contact our office and allow 72 hours for refills to be completed.    Today you received the following chemotherapy and/or immunotherapy agents: trastuzumab-emtansine      To help prevent nausea and vomiting after your treatment, we encourage you to take your nausea medication as directed.  BELOW ARE SYMPTOMS THAT SHOULD BE REPORTED IMMEDIATELY: *FEVER GREATER THAN 100.4 F (38 C) OR HIGHER *CHILLS OR SWEATING *NAUSEA AND VOMITING THAT IS NOT CONTROLLED WITH YOUR NAUSEA MEDICATION *UNUSUAL SHORTNESS OF BREATH *UNUSUAL BRUISING OR BLEEDING *URINARY PROBLEMS (pain or burning when urinating, or frequent urination) *BOWEL PROBLEMS (unusual diarrhea, constipation, pain near the anus) TENDERNESS IN MOUTH AND THROAT WITH OR WITHOUT PRESENCE OF ULCERS (sore throat, sores in mouth, or a toothache) UNUSUAL RASH, SWELLING OR PAIN  UNUSUAL VAGINAL DISCHARGE OR ITCHING   Items with * indicate a potential emergency and should be followed up as soon as possible or go to the Emergency Department if any problems should occur.  Please show the CHEMOTHERAPY ALERT CARD or IMMUNOTHERAPY ALERT  CARD at check-in to the Emergency Department and triage nurse.  Should you have questions after your visit or need to cancel or reschedule your appointment, please contact [facility]  Dept: [phone]  and follow the prompts.  Office hours are 8:00 a.m. to 4:30 p.m. Monday - Friday. Please note that voicemails left after 4:00 p.m. may not be returned until the following business day.  We are closed weekends and major holidays. You have access to a nurse at all times for urgent questions. Please call the main number to the clinic Dept: [phone] and follow the prompts.   For any non-urgent questions, you may also contact your provider using MyChart. We now offer e-Visits for anyone 12 and older to request care online for non-urgent symptoms. For details visit [email or web address].   Also download the MyChart app! Go to the app store, search "MyChart", open the app, select [place], and log in with your MyChart username and password.

## 2023-01-05 ENCOUNTER — Other Ambulatory Visit: Payer: Self-pay

## 2023-01-24 ENCOUNTER — Ambulatory Visit: Payer: Medicare HMO | Admitting: Internal Medicine

## 2023-01-24 MED FILL — Fosaprepitant Dimeglumine For IV Infusion 150 MG (Base Eq): INTRAVENOUS | Qty: 5 | Status: AC

## 2023-01-24 NOTE — Assessment & Plan Note (Signed)
[id number] stage IV, grade 2-3, ER-/PR- HER2+, with nodal and pulmonary metastases -She initially self palpated a left breast mass around 06/2020. Work-up showed 2 masses in the left breast UOQ and 3 abnormal lymph nodes. 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 -PET scan 08/28/20 showed: hypermetabolism to left breast and axilla, retropectoral and supraclavicular adenopathy, and several 3-6 mm pulmonary nodules. -She started first-line Kadcyla on 09/11/20. She tolerates well with some fatigue, dose decreased with C4.  -She has had good clinical response, breast mass was no longer palpable on physical exam 06/09/21.  -last PET scan in 01/2022 showed two small lung nodule (5-58mm), slightly bigger since last scan, no other hypermetabolic lesions.   -Her restaging CT scan from June 17, 2022 showed multiple enlarging lung nodules, largest measuring 1 cm, previously 0.6 cm.  This is highly concerning for pulmonary metastasis with slight disease progression. No other new lesions -she tried tucatinib 150mg  twice daily but did not tolerate with excessive fatigue and drowsiness -she restarted Tucatinib at 50mg  bid last week , she is tolerating well -I increased Tucatinib to 100mg  bid on 5/29  -She did not tolerate well, with more fatigue, and unstable gait.  She also developed worsening renal function, I stopped Tucatinib on 08/15/2022 -She is clinically stable, renal function has returned to her baseline, but no improvement in her fatigue and gait.  Will continue holding tucatinib now and continue Kadcyla  -repeated CT from 10/03/2022 showed mild progression in lung mets (comparing to three previous scans), but overall low tumor burden and no additional new mets. So we decided to continue Kadcyla and monitor her disease closely  -She again had a multiple falls lately, and more fatigued.  We discussed a chemo break.  Chemo held after treatment on 10/05/2022 -due to disease progression, will change to  Enhertu today 01/25/23

## 2023-01-25 ENCOUNTER — Inpatient Hospital Stay: Payer: Medicare HMO | Attending: Hematology

## 2023-01-25 ENCOUNTER — Other Ambulatory Visit: Payer: Self-pay

## 2023-01-25 ENCOUNTER — Inpatient Hospital Stay: Payer: Medicare HMO

## 2023-01-25 ENCOUNTER — Telehealth: Payer: Self-pay

## 2023-01-25 ENCOUNTER — Encounter: Payer: Self-pay | Admitting: Hematology

## 2023-01-25 ENCOUNTER — Inpatient Hospital Stay (HOSPITAL_BASED_OUTPATIENT_CLINIC_OR_DEPARTMENT_OTHER): Payer: Medicare HMO | Attending: Hematology | Admitting: Hematology

## 2023-01-25 VITALS — BP 105/82 | HR 78 | Temp 98.2°F | Resp 18 | Ht 64.0 in | Wt 155.8 lb

## 2023-01-25 DIAGNOSIS — D649 Anemia, unspecified: Secondary | ICD-10-CM | POA: Diagnosis not present

## 2023-01-25 DIAGNOSIS — J439 Emphysema, unspecified: Secondary | ICD-10-CM | POA: Diagnosis not present

## 2023-01-25 DIAGNOSIS — Z95828 Presence of other vascular implants and grafts: Secondary | ICD-10-CM

## 2023-01-25 DIAGNOSIS — I10 Essential (primary) hypertension: Secondary | ICD-10-CM | POA: Diagnosis not present

## 2023-01-25 DIAGNOSIS — Z5112 Encounter for antineoplastic immunotherapy: Secondary | ICD-10-CM | POA: Diagnosis present

## 2023-01-25 DIAGNOSIS — C7800 Secondary malignant neoplasm of unspecified lung: Secondary | ICD-10-CM | POA: Insufficient documentation

## 2023-01-25 DIAGNOSIS — I2510 Atherosclerotic heart disease of native coronary artery without angina pectoris: Secondary | ICD-10-CM | POA: Diagnosis not present

## 2023-01-25 DIAGNOSIS — Z171 Estrogen receptor negative status [ER-]: Secondary | ICD-10-CM

## 2023-01-25 DIAGNOSIS — M5116 Intervertebral disc disorders with radiculopathy, lumbar region: Secondary | ICD-10-CM | POA: Diagnosis not present

## 2023-01-25 DIAGNOSIS — M4726 Other spondylosis with radiculopathy, lumbar region: Secondary | ICD-10-CM | POA: Diagnosis not present

## 2023-01-25 DIAGNOSIS — Z8673 Personal history of transient ischemic attack (TIA), and cerebral infarction without residual deficits: Secondary | ICD-10-CM | POA: Insufficient documentation

## 2023-01-25 DIAGNOSIS — R296 Repeated falls: Secondary | ICD-10-CM | POA: Diagnosis not present

## 2023-01-25 DIAGNOSIS — H269 Unspecified cataract: Secondary | ICD-10-CM | POA: Diagnosis not present

## 2023-01-25 DIAGNOSIS — C50412 Malignant neoplasm of upper-outer quadrant of left female breast: Secondary | ICD-10-CM | POA: Insufficient documentation

## 2023-01-25 DIAGNOSIS — Z79899 Other long term (current) drug therapy: Secondary | ICD-10-CM | POA: Insufficient documentation

## 2023-01-25 DIAGNOSIS — I700 Atherosclerosis of aorta: Secondary | ICD-10-CM | POA: Diagnosis not present

## 2023-01-25 DIAGNOSIS — Z1722 Progesterone receptor negative status: Secondary | ICD-10-CM | POA: Insufficient documentation

## 2023-01-25 DIAGNOSIS — R590 Localized enlarged lymph nodes: Secondary | ICD-10-CM | POA: Diagnosis not present

## 2023-01-25 DIAGNOSIS — N200 Calculus of kidney: Secondary | ICD-10-CM | POA: Diagnosis not present

## 2023-01-25 LAB — CBC WITH DIFFERENTIAL (CANCER CENTER ONLY)
Abs Immature Granulocytes: 0.01 10*3/uL (ref 0.00–0.07)
Basophils Absolute: 0 10*3/uL (ref 0.0–0.1)
Basophils Relative: 0 %
Eosinophils Absolute: 0.2 10*3/uL (ref 0.0–0.5)
Eosinophils Relative: 6 %
HCT: 16.8 % — ABNORMAL LOW (ref 36.0–46.0)
Hemoglobin: 5.5 g/dL — CL (ref 12.0–15.0)
Immature Granulocytes: 0 %
Lymphocytes Relative: 37 %
Lymphs Abs: 1.6 10*3/uL (ref 0.7–4.0)
MCH: 25.6 pg — ABNORMAL LOW (ref 26.0–34.0)
MCHC: 32.7 g/dL (ref 30.0–36.0)
MCV: 78.1 fL — ABNORMAL LOW (ref 80.0–100.0)
Monocytes Absolute: 0.4 10*3/uL (ref 0.1–1.0)
Monocytes Relative: 10 %
Neutro Abs: 2.1 10*3/uL (ref 1.7–7.7)
Neutrophils Relative %: 47 %
Platelet Count: 151 10*3/uL (ref 150–400)
RBC: 2.15 MIL/uL — ABNORMAL LOW (ref 3.87–5.11)
RDW: 18.2 % — ABNORMAL HIGH (ref 11.5–15.5)
WBC Count: 4.3 10*3/uL (ref 4.0–10.5)
nRBC: 0 % (ref 0.0–0.2)

## 2023-01-25 LAB — CMP (CANCER CENTER ONLY)
ALT: 24 U/L (ref 0–44)
AST: 49 U/L — ABNORMAL HIGH (ref 15–41)
Albumin: 3.1 g/dL — ABNORMAL LOW (ref 3.5–5.0)
Alkaline Phosphatase: 87 U/L (ref 38–126)
Anion gap: 4 — ABNORMAL LOW (ref 5–15)
BUN: 23 mg/dL (ref 8–23)
CO2: 27 mmol/L (ref 22–32)
Calcium: 9.5 mg/dL (ref 8.9–10.3)
Chloride: 112 mmol/L — ABNORMAL HIGH (ref 98–111)
Creatinine: 1.61 mg/dL — ABNORMAL HIGH (ref 0.44–1.00)
GFR, Estimated: 32 mL/min — ABNORMAL LOW (ref >60)
Glucose, Bld: 117 mg/dL — ABNORMAL HIGH (ref 70–99)
Potassium: 3.4 mmol/L — ABNORMAL LOW (ref 3.5–5.1)
Sodium: 143 mmol/L (ref 135–145)
Total Bilirubin: 0.9 mg/dL (ref <1.2)
Total Protein: 6 g/dL — ABNORMAL LOW (ref 6.5–8.1)

## 2023-01-25 LAB — FERRITIN: Ferritin: 28 ng/mL (ref 11–307)

## 2023-01-25 LAB — RETIC PANEL
Immature Retic Fract: 29.2 % — ABNORMAL HIGH (ref 2.3–15.9)
RBC.: 2.12 MIL/uL — ABNORMAL LOW (ref 3.87–5.11)
Retic Count, Absolute: 73.4 10*3/uL (ref 19.0–186.0)
Retic Ct Pct: 3.5 % — ABNORMAL HIGH (ref 0.4–3.1)
Reticulocyte Hemoglobin: 25.3 pg — ABNORMAL LOW (ref >27.9)

## 2023-01-25 LAB — VITAMIN B12: Vitamin B-12: 1384 pg/mL — ABNORMAL HIGH (ref 180–914)

## 2023-01-25 LAB — ABO/RH: ABO/RH(D): O POS

## 2023-01-25 LAB — HEMOGLOBIN AND HEMATOCRIT (CANCER CENTER ONLY)
HCT: 16.4 % — ABNORMAL LOW (ref 36.0–46.0)
Hemoglobin: 5.5 g/dL — CL (ref 12.0–15.0)

## 2023-01-25 LAB — PREPARE RBC (CROSSMATCH)

## 2023-01-25 LAB — SAMPLE TO BLOOD BANK

## 2023-01-25 MED ORDER — SODIUM CHLORIDE 0.9% IV SOLUTION
250.0000 mL | INTRAVENOUS | Status: DC
Start: 2023-01-25 — End: 2023-01-25
  Administered 2023-01-25: 100 mL via INTRAVENOUS

## 2023-01-25 MED ORDER — SODIUM CHLORIDE 0.9% FLUSH
10.0000 mL | Freq: Once | INTRAVENOUS | Status: AC
  Administered 2023-01-25: 10 mL

## 2023-01-25 NOTE — Progress Notes (Signed)
[facility]   [phone] Fax:([phone]   Clinic Follow up Note   Patient Care Team: [name], MD as PCP - General (Internal Medicine) Malachy Mood, MD as Consulting Physician (Medical Oncology)  Date of Service:  01/25/2023  CHIEF COMPLAINT: f/u of metastatic breast cancer  CURRENT THERAPY:  Changing to second line Enhertu  Oncology History   Malignant neoplasm of upper-outer quadrant of female breast (HCC) cT2N3M1 stage IV, grade 2-3, ER-/PR- HER2+, with nodal and pulmonary metastases -She initially self palpated a left breast mass around 06/2020. Work-up showed 2 masses in the left breast UOQ and 3 abnormal lymph nodes. 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 -PET scan 08/28/20 showed: hypermetabolism to left breast and axilla, retropectoral and supraclavicular adenopathy, and several 3-6 mm pulmonary nodules. -She started first-line Kadcyla on 09/11/20. She tolerates well with some fatigue, dose decreased with C4.  -She has had good clinical response, breast mass was no longer palpable on physical exam 06/09/21.  -last PET scan in 01/2022 showed two small lung nodule (5-46mm), slightly bigger since last scan, no other hypermetabolic lesions.   -Her restaging CT scan from June 17, 2022 showed multiple enlarging lung nodules, largest measuring 1 cm, previously 0.6 cm.  This is highly concerning for pulmonary metastasis with slight disease progression. No other new lesions -she tried tucatinib 150mg  twice daily but did not tolerate with excessive fatigue and drowsiness -she restarted Tucatinib at 50mg  bid last week , she is tolerating well -I increased Tucatinib to 100mg  bid on 5/29  -She did not tolerate well, with more fatigue, and unstable gait.  She also developed worsening renal function, I stopped Tucatinib on 08/15/2022 -She is clinically stable, renal function has returned to her baseline, but no improvement in her fatigue and  gait.  Will continue holding tucatinib now and continue Kadcyla  -repeated CT from 10/03/2022 showed mild progression in lung mets (comparing to three previous scans), but overall low tumor burden and no additional new mets. So we decided to continue Kadcyla and monitor her disease closely  -She again had a multiple falls lately, and more fatigued.  We discussed a chemo break.  Chemo held after treatment on 10/05/2022 -due to disease progression, will change to Enhertu today 01/25/23    Assessment and Plan    Metastatic Breast Cancer Follow-up for metastatic breast cancer. Recent CT scan shows progression with increased pulmonary metastases.  Plan to change treatment to Enhertu. Chemotherapy deferred today due to severe anemia. Discussed rescheduling chemotherapy and monitoring blood counts. Patient prefers chemotherapy before Thanksgiving if possible. - Reschedule chemotherapy - Check availability for chemotherapy next Monday or Tuesday before Thanksgiving - If not available, schedule chemotherapy for the week after Thanksgiving  Severe Anemia Hemoglobin critically low at 5.5 g/dL, down from 9.6 g/dL last tiem. Symptoms include fatigue and balance issues. No obvious signs of bleeding. Possible causes include bleeding, chemotherapy, or cancer progression. Explained need for blood transfusion to improve hemoglobin levels and alleviate symptoms. Patient consented after understanding risks and benefits. - Administer 2 units of blood transfusion today - Repeat hemoglobin to confirm accuracy - Perform additional anemia workup including B12 and iron levels - Consider IV iron if iron levels are low - Schedule follow-up lab work next week to monitor blood counts  Hypertension Requires refill of antihypertensive medication. Advised to contact primary care physician and pharmacy for refills. - Contact primary care physician for antihypertensive medication refill - Instruct patient to call  pharmacy for  refills  Cataracts Reports worsening vision despite recent visit to the ophthalmologist and new glasses. Possible cataracts noted. Advised to follow up with ophthalmologist for further evaluation. - Refer back to ophthalmologist for further evaluation of vision changes  General Health Maintenance Inquired about over-the-counter medications including baby aspirin. - Advise to purchase baby aspirin over the counter  Plan -Will proceed to 2 units of blood transfusion today due to severe anemia, and postpone chemotherapy to next week - Schedule follow-up lab work next week to monitor blood counts -f/u in 2 weeks with lab        SUMMARY OF ONCOLOGIC HISTORY: Oncology History Overview Note  Cancer Staging Malignant neoplasm of upper-outer quadrant of female breast (HCC) Staging form: Breast, AJCC 8th Edition - Clinical stage from 07/31/2020: Stage IV (cT2, cN3, cM1, G2, ER-, PR-, HER2+) - Signed by Pollyann Samples, NP on 08/31/2020 Stage prefix: Initial diagnosis Nuclear grade: G2 Histologic grading system: 3 grade system    Malignant neoplasm of upper-outer quadrant of female breast (HCC)  07/24/2020 Breast US   On physical exam, a very firm mass is identified at the 1:30 position of the LEFT breast 5 cm from the nipple.   IMPRESSION: 1. Highly suspicious 4.3 cm mass at the 1:30 position of the LEFT breast and highly suspicious 2.7 cm mass at the 2 o'clock position of the LEFT breast, with the 2 masses encompassing an area measuring at least 6.1 cm. 2. 3 abnormal LEFT axillary lymph nodes with cortical thickening, tissue sampling of 1 of these lymph nodes recommended. 3. Anterior LEFT breast skin thickening nonspecific but may reflect dermal involvement. 4. No mammographic evidence of RIGHT breast malignancy.   07/31/2020 Initial Biopsy   Diagnosis 1. Breast, left, needle core biopsy, 1 :30 pm, 5cmfn - INVASIVE DUCTAL CARCINOMA, SEE COMMENT. 2. Breast, left, needle core biopsy,  2 o'clock, 8cmfn - INVASIVE DUCTAL CARCINOMA, SEE COMMENT. - DUCTAL CARCINOMA IN SITU. 3. Lymph node, needle/core biopsy, left axillary - METASTATIC CARCINOMA IN A LYMPH NODE. Microscopic Comment 1. and 3. The carcinoma in both parts has a similar morphology and appears grade 2-3. The DCIS in part 2 is high-grade with necrosis. The carcinoma measures 15 mm (part 1) and 14 mm (part 2) in greatest linear extent  2. PROGNOSTIC INDICATORS Results: IMMUNOHISTOCHEMICAL AND MORPHOMETRIC ANALYSIS PERFORMED MANUALLY The tumor cells are POSITIVE for Her2 (3+). Estrogen Receptor: 0%, NEGATIVE Progesterone Receptor: 0%, NEGATIVE Proliferation Marker Ki67: 20%    07/31/2020 Cancer Staging   Staging form: Breast, AJCC 8th Edition - Clinical stage from 07/31/2020: Stage IV (cT2, cN3, cM1, G2, ER-, PR-, HER2+) - Signed by Pollyann Samples, NP on 08/31/2020 Stage prefix: Initial diagnosis Nuclear grade: G2 Histologic grading system: 3 grade system   08/13/2020 Initial Diagnosis   Malignant neoplasm of upper-outer quadrant of female breast (HCC)   08/28/2020 PET scan   IMPRESSION: Signs of LEFT breast cancer with LEFT axillary and retropectoral adenopathy as well as LEFT supraclavicular adenopathy.   Signs of pulmonary metastatic disease.   Mild asymmetric uptake in the LEFT as compared to the RIGHT adrenal gland. Equivocal but suspicious, attention on follow-up.   Aortic atherosclerosis.   Cystic changes and potential bronchiectasis in the RIGHT lung base likely post infectious or inflammatory. Correlate with any respiratory symptoms. Comparison with prior imaging may be helpful with attention on follow-up.   08/28/2020 Imaging   BRAIN MRI IMPRESSION: 1. Subacute lacunar infarct of the left basal ganglia with no  malignant hemorrhagic transformation or mass effect. Underlying chronic bilateral basal ganglia ischemia. 2. No metastatic disease or other acute intracranial abnormality identified.    08/28/2020 Imaging   Breast MRI   IMPRESSION: 1. 8.1 x 8.1 x 5.1 cm area of biopsy-proven malignancy in the central, upper outer and lower outer quadrants of the left breast. 2. Diffuse anterior left breast skin thickening without abnormal enhancement. This may represent thickening due to lymphedema associated with lymphatic obstruction. 3. Metastatic level 1 and level 2 left axillary adenopathy. 4. No evidence of malignancy on the right.   09/11/2020 - 11/03/2021 Chemotherapy   Patient is on Treatment Plan : BREAST ADO-Trastuzumab Emtansine (Kadcyla) q21d     09/11/2020 - 01/04/2023 Chemotherapy   Patient is on Treatment Plan : BREAST ADO-Trastuzumab Emtansine (Kadcyla) q21d     11/30/2020 Imaging   CT Chest w/o contrast  IMPRESSION: 1. Interval response to therapy. The left breast mass has mildly decreased in size in the interval. 2. Decrease in size of left axillary and left retropectoral lymph nodes. 3. Small right lung nodules stable to improved. 4. Coronary artery calcifications noted. 5. Aortic Atherosclerosis (ICD10-I70.0).   03/04/2021 PET scan   IMPRESSION: 1. Overall marked improvement compared to the prior PET-CT, with resolution of hypermetabolic activity and pathologic enlargement of the previously involved lymph nodes; lack of hypermetabolic activity and nonvisualization of prior right-sided lung nodules, and substantial reduction in size and activity in the left breast primary. 2. New ground-glass opacity medially in the right lower lobe with low-grade metabolic activity, highly likely to be inflammatory (alveolitis) given the morphology and appearance. 3. Other imaging findings of potential clinical significance: Aortic Atherosclerosis (ICD10-I70.0). Coronary atherosclerosis. Mitral valve calcifications. Punctate nonobstructive left renal calculi. Lower lumbar impingement due to chronic spondylosis and degenerative disc disease. Remote left basal ganglia infarct.    06/07/2021 Imaging   EXAM: CT CHEST, ABDOMEN AND PELVIS WITHOUT CONTRAST  IMPRESSION: 1. Scattered tiny bilateral pulmonary nodules are similar to the prior exams, favored to be benign. 2. Otherwise, no evidence of metastatic disease within the chest, abdomen, or pelvis. 3. Right lower lobe ground-glass nodule has resolved since the prior PET 4.  Possible constipation. 5. Coronary artery atherosclerosis. Aortic Atherosclerosis (ICD10-I70.0). Emphysema (ICD10-J43.9). 6. Left nephrolithiasis.   06/15/2022 Imaging    IMPRESSION: 1. Mild increase in size of bilateral pulmonary nodules compatible with progression of metastatic disease. 2. No evidence for metastatic disease within the abdomen or pelvis. 3. Large volume of desiccated stool identified within the rectum. Correlate for any clinical signs or symptoms of constipation. 4. Nonobstructing left renal calculus. 5. Aortic Atherosclerosis (ICD10-I70.0).     10/03/2022 Imaging    IMPRESSION: 1. Mild but definite progression of pulmonary metastasis. 2. No extrathoracic metastatic disease identified. 3. Incidental findings, including: Left nephrolithiasis. Coronary artery atherosclerosis. Aortic Atherosclerosis (ICD10-I70.0). Possible constipation.   01/31/2023 -  Chemotherapy   Patient is on Treatment Plan : BREAST METASTATIC Fam-Trastuzumab Deruxtecan-nxki (Enhertu) (5.4) q21d        Discussed the use of AI scribe software for clinical note transcription with the patient, who gave verbal consent to proceed.  History of Present Illness   The patient, an 82 year old female with metastatic breast cancer, presents for a follow-up visit. She reports feeling tired and having balance issues. She also mentions frequent urination but denies any pain or blood in the urine. She has not noticed any bleeding elsewhere. She also reports a change in her vision, describing it as dark and not clear,  despite recently getting new glasses. She  denies any pain in the stomach or back but mentions occasional discomfort in her back. She also reports a sensation of something coming back at night, although it is unclear what she is referring to. She has not started her new medications yet as they are meant to be taken after chemotherapy and if she feels nauseous, which she has not experienced yet. She is due for a refill on her blood pressure medication and needs to purchase over-the-counter baby aspirin.         All other systems were reviewed with the patient and are negative.  MEDICAL HISTORY:  Past Medical History:  Diagnosis Date   Diabetes mellitus without complication (HCC)    Hyperlipidemia    Hypertension     SURGICAL HISTORY: Past Surgical History:  Procedure Laterality Date   FOOT FRACTURE SURGERY Right    IR IMAGING GUIDED PORT INSERTION  09/22/2020   IR RADIOLOGIST EVAL & MGMT  09/25/2020    I have reviewed the social history and family history with the patient and they are unchanged from previous note.  ALLERGIES:  has No Known Allergies.  MEDICATIONS:  Current Outpatient Medications  Medication Sig Dispense Refill   aspirin EC 325 MG tablet Take 1 tablet (325 mg total) by mouth daily. 60 tablet 3   atorvastatin (LIPITOR) 20 MG tablet Take 1 tablet by mouth once daily 60 tablet 0   dexamethasone (DECADRON) 4 MG tablet Take 2 tablets (8 mg) by mouth daily for 3 days starting the day after chemotherapy. Take with food. 30 tablet 1   ferrous sulfate 325 (65 FE) MG EC tablet Take 1 tablet (325 mg total) by mouth daily with breakfast. 30 tablet 3   lidocaine-prilocaine (EMLA) cream Apply 1 Application topically as needed. 30 g 2   lidocaine-prilocaine (EMLA) cream Apply to affected area once 30 g 3   metFORMIN (GLUCOPHAGE) 500 MG tablet Take 1 tablet (500 mg total) by mouth 2 (two) times daily with a meal. 60 tablet 1   olmesartan (BENICAR) 40 MG tablet Take 1 tablet (40 mg total) by mouth daily. 30 tablet 0    ondansetron (ZOFRAN) 8 MG tablet Take 1 tablet (8 mg total) by mouth every 8 (eight) hours as needed for nausea or vomiting. Start on the third day after chemotherapy. 30 tablet 1   potassium chloride SA (KLOR-CON M) 20 MEQ tablet Take 1 tablet by mouth once daily 30 tablet 0   prochlorperazine (COMPAZINE) 10 MG tablet Take 1 tablet (10 mg total) by mouth every 6 (six) hours as needed for nausea or vomiting. 30 tablet 1   No current facility-administered medications for this visit.   Facility-Administered Medications Ordered in Other Visits  Medication Dose Route Frequency Provider Last Rate Last Admin   0.9 %  sodium chloride infusion (Manually program via Guardrails IV Fluids)  250 mL Intravenous Continuous Malachy Mood, MD        PHYSICAL EXAMINATION: ECOG PERFORMANCE STATUS: 2 - Symptomatic, <50% confined to bed  Vitals:   01/25/23 0956  BP: 105/82  Pulse: 78  Resp: 18  Temp: 98.2 F (36.8 C)  SpO2: 100%   Wt Readings from Last 3 Encounters:  01/25/23 155 lb 12.8 oz (70.7 kg)  01/04/23 152 lb 6 oz (69.1 kg)  12/06/22 150 lb 3.2 oz (68.1 kg)     GENERAL:alert, no distress and comfortable SKIN: skin color, texture, turgor are normal, no rashes or significant lesions EYES:  normal, Conjunctiva are pink and non-injected, sclera clear NECK: supple, thyroid normal size, non-tender, without nodularity LYMPH:  no palpable lymphadenopathy in the cervical, axillary  LUNGS: clear to auscultation and percussion with normal breathing effort HEART: regular rate & rhythm and no murmurs and no lower extremity edema ABDOMEN:abdomen soft, non-tender and normal bowel sounds Musculoskeletal:no cyanosis of digits and no clubbing  NEURO: alert & oriented x 3 with fluent speech, no focal motor/sensory deficits    LABORATORY DATA:  I have reviewed the data as listed    Latest Ref Rng & Units 01/25/2023   10:29 AM 01/25/2023    9:12 AM 01/04/2023   10:58 AM  CBC  WBC 4.0 - 10.5 K/uL  4.3   4.4   Hemoglobin 12.0 - 15.0 g/dL 5.5  5.5  9.6   Hematocrit 36.0 - 46.0 % 16.4  16.8  27.6   Platelets 150 - 400 K/uL  151  123         Latest Ref Rng & Units 01/25/2023    9:12 AM 01/04/2023   10:58 AM 12/06/2022   12:58 PM  CMP  Glucose 70 - 99 mg/dL 098  84  119   BUN 8 - 23 mg/dL 23  19  18    Creatinine 0.44 - 1.00 mg/dL 1.47  8.29  5.62   Sodium 135 - 145 mmol/L 143  144  143   Potassium 3.5 - 5.1 mmol/L 3.4  3.9  3.8   Chloride 98 - 111 mmol/L 112  112  111   CO2 22 - 32 mmol/L 27  26  27    Calcium 8.9 - 10.3 mg/dL 9.5  9.9  13.0   Total Protein 6.5 - 8.1 g/dL 6.0  6.6  6.8   Total Bilirubin <1.2 mg/dL 0.9  0.9  1.0   Alkaline Phos 38 - 126 U/L 87  89  76   AST 15 - 41 U/L 49  44  46   ALT 0 - 44 U/L 24  24  25        RADIOGRAPHIC STUDIES: I have personally reviewed the radiological images as listed and agreed with the findings in the report. No results found.    Orders Placed This Encounter  Procedures   Ferritin    Standing Status:   Standing    Number of Occurrences:   20    Standing Expiration Date:   01/25/2024   Folate RBC    Standing Status:   Future    Number of Occurrences:   1    Standing Expiration Date:   01/25/2024   Haptoglobin    Standing Status:   Future    Number of Occurrences:   1    Standing Expiration Date:   01/25/2024   Vitamin B12    Standing Status:   Future    Number of Occurrences:   1    Standing Expiration Date:   01/25/2024   Retic Panel    Standing Status:   Future    Number of Occurrences:   1    Standing Expiration Date:   01/25/2024   Hemoglobin and Hematocrit (Cancer Center Only)    Standing Status:   Future    Number of Occurrences:   1    Standing Expiration Date:   01/25/2024   Informed Consent Details: Physician/Practitioner Attestation; Transcribe to consent form and obtain patient signature    Order Specific Question:   Physician/Practitioner attestation of informed consent for blood and or  blood product  transfusion    Answer:   I, the physician/practitioner, attest that I have discussed with the patient the benefits, risks, side effects, alternatives, likelihood of achieving goals and potential problems during recovery for the procedure that I have provided informed consent.    Order Specific Question:   Product(s)    Answer:   All Product(s)   All questions were answered. The patient knows to call the clinic with any problems, questions or concerns. No barriers to learning was detected. The total time spent in the appointment was 30 minutes.     Malachy Mood, MD 01/25/2023

## 2023-01-25 NOTE — Progress Notes (Signed)
BP elevated after 15 min VS check during blood transfusion. Pt denies other adverse s/s. Per pt, she baseline had hypertension and is followed by cardiology. Per Jae Dire, [place], okay to proceed with blood transfusion but rate decreased to 232ml/hr.

## 2023-01-25 NOTE — Progress Notes (Signed)
Per Mosetta Putt, MD, okay to run blood at 33ml/hr

## 2023-01-25 NOTE — Patient Instructions (Signed)
Blood Transfusion, Adult A blood transfusion is a procedure in which you receive blood through an IV tube. You may need this procedure because of: A bleeding disorder. An illness. An injury. A surgery. The blood may come from someone else (a donor). You may also be able to donate blood for yourself before a surgery. The blood given in a transfusion may be made up of different types of cells. You may get: Red blood cells. These carry oxygen to the cells in the body. Platelets. These help your blood to clot. Plasma. This is the liquid part of your blood. It carries proteins and other substances through the body. White blood cells. These help you fight infections. If you have a clotting disorder, you may also get other types of blood products. Depending on the type of blood product, this procedure may take 1-4 hours to complete. Tell your doctor about: Any bleeding problems you have. Any reactions you have had during a blood transfusion in the past. Any allergies you have. All medicines you are taking, including vitamins, herbs, eye drops, creams, and over-the-counter medicines. Any surgeries you have had. Any medical conditions you have. Whether you are pregnant or may be pregnant. What are the risks? Talk with your health care provider about risks. The most common problems include: A mild allergic reaction. This includes red, swollen areas of skin (hives) and itching. Fever or chills. This may be the body's response to new blood cells received. This may happen during or up to 4 hours after the transfusion. More serious problems may include: A serious allergic reaction. This includes breathing trouble or swelling around the face and lips. Too much fluid in the lungs. This may cause breathing problems. Lung injury. This causes breathing trouble and low oxygen in the blood. This can happen within hours of the transfusion or days later. Too much iron. This can happen after getting many blood  transfusions over a period of time. An infection or virus passed through the blood. This is rare. Donated blood is carefully tested before it is given. Your body's defense system (immune system) trying to attack the new blood cells. This is rare. Symptoms may include fever, chills, nausea, low blood pressure, and low back or chest pain. Donated cells attacking healthy tissues. This is rare. What happens before the procedure? You will have a blood test to find out your blood type. The test also finds out what type of blood your body will accept and matches it to the donor type. If you are going to have a planned surgery, you may be able to donate your own blood. This may be done in case you need a transfusion. You will have your temperature, blood pressure, and pulse checked. You may receive medicine to help prevent an allergic reaction. This may be done if you have had a reaction to a transfusion before. This medicine may be given to you by mouth or through an IV tube. What happens during the procedure?  An IV tube will be put into one of your veins. The bag of blood will be attached to your IV tube. Then, the blood will enter through your vein. Your temperature, blood pressure, and pulse will be checked often. This is done to find early signs of a transfusion reaction. Tell your nurse right away if you have any of these symptoms: Shortness of breath or trouble breathing. Chest or back pain. Fever or chills. Red, swollen areas of skin or itching. If you have any signs   or symptoms of a reaction, your transfusion will be stopped. You may also be given medicine. When the transfusion is finished, your IV tube will be taken out. Pressure may be put on the IV site for a few minutes. A bandage (dressing) will be put on the IV site. The procedure may vary among doctors and hospitals. What happens after the procedure? You will be monitored until you leave the hospital or clinic. This includes  checking your temperature, blood pressure, pulse, breathing rate, and blood oxygen level. Your blood may be tested to see how you have responded to the transfusion. You may be warmed with fluids or blankets. This is done to keep the temperature of your body normal. If you have your procedure in an outpatient setting, you will be told whom to contact to report any reactions. Where to find more information Visit the [employer]: redcross.org Summary A blood transfusion is a procedure in which you receive blood through an IV tube. The blood you are given may be made up of different blood cells. You may receive red blood cells, platelets, plasma, or white blood cells. Your temperature, blood pressure, and pulse will be checked often. After the procedure, your blood may be tested to see how you have responded. This information is not intended to replace advice given to you by your health care provider. Make sure you discuss any questions you have with your health care provider. Document Revised: 05/21/2021 Document Reviewed: 05/21/2021 Elsevier Patient Education  2024 [employer]  

## 2023-01-25 NOTE — Telephone Encounter (Signed)
Critical Lab Value reported: Hbg 5.5 Dr. Mosetta Putt notified pt will receive 2 units PRBCs today.

## 2023-01-25 NOTE — Progress Notes (Signed)
Pharmacist Chemotherapy Monitoring - Initial Assessment    Anticipated start date: 01/31/23   The following has been reviewed per standard work regarding the patient's treatment regimen: The patient's diagnosis, treatment plan and drug doses, and organ/hematologic function Lab orders and baseline tests specific to treatment regimen  The treatment plan start date, drug sequencing, and pre-medications Prior authorization status  Patient's documented medication list, including drug-drug interaction screen and prescriptions for anti-emetics and supportive care specific to the treatment regimen The drug concentrations, fluid compatibility, administration routes, and timing of the medications to be used The patient's access for treatment and lifetime cumulative dose history, if applicable  The patient's medication allergies and previous infusion related reactions, if applicable   Changes made to treatment plan:  N/A  Follow up needed:  N/A   Ebony Hail, Pharm.D., CPP 01/25/2023@3 :31 PM

## 2023-01-26 ENCOUNTER — Other Ambulatory Visit: Payer: Self-pay

## 2023-01-26 LAB — FOLATE RBC
Folate, Hemolysate: 261 ng/mL
Folate, RBC: 1491 ng/mL (ref >498)
Hematocrit: 17.5 % — CL (ref 34.0–46.6)

## 2023-01-26 LAB — BPAM RBC
Blood Product Expiration Date: 202412162359
Blood Product Expiration Date: 202412162359
ISSUE DATE / TIME: 202411201252
ISSUE DATE / TIME: 202411201252
Unit Type and Rh: 5100
Unit Type and Rh: 5100

## 2023-01-26 LAB — TYPE AND SCREEN
ABO/RH(D): O POS
Antibody Screen: NEGATIVE
Unit division: 0
Unit division: 0

## 2023-01-26 LAB — HAPTOGLOBIN: Haptoglobin: 76 mg/dL (ref 41–333)

## 2023-01-30 MED FILL — Fosaprepitant Dimeglumine For IV Infusion 150 MG (Base Eq): INTRAVENOUS | Qty: 5 | Status: AC

## 2023-01-31 ENCOUNTER — Inpatient Hospital Stay: Payer: Medicare HMO

## 2023-01-31 ENCOUNTER — Other Ambulatory Visit: Payer: Self-pay

## 2023-01-31 ENCOUNTER — Other Ambulatory Visit: Payer: Self-pay | Admitting: Hematology

## 2023-01-31 ENCOUNTER — Other Ambulatory Visit: Payer: Self-pay | Admitting: Internal Medicine

## 2023-01-31 VITALS — BP 178/90 | HR 74 | Temp 98.3°F | Resp 16 | Ht 64.0 in | Wt 160.0 lb

## 2023-01-31 DIAGNOSIS — Z171 Estrogen receptor negative status [ER-]: Secondary | ICD-10-CM

## 2023-01-31 DIAGNOSIS — Z95828 Presence of other vascular implants and grafts: Secondary | ICD-10-CM

## 2023-01-31 DIAGNOSIS — Z5112 Encounter for antineoplastic immunotherapy: Secondary | ICD-10-CM | POA: Diagnosis not present

## 2023-01-31 LAB — CBC WITH DIFFERENTIAL (CANCER CENTER ONLY)
Abs Immature Granulocytes: 0.01 10*3/uL (ref 0.00–0.07)
Basophils Absolute: 0 10*3/uL (ref 0.0–0.1)
Basophils Relative: 0 %
Eosinophils Absolute: 0.2 10*3/uL (ref 0.0–0.5)
Eosinophils Relative: 5 %
HCT: 24.1 % — ABNORMAL LOW (ref 36.0–46.0)
Hemoglobin: 8 g/dL — ABNORMAL LOW (ref 12.0–15.0)
Immature Granulocytes: 0 %
Lymphocytes Relative: 36 %
Lymphs Abs: 1.8 10*3/uL (ref 0.7–4.0)
MCH: 26.3 pg (ref 26.0–34.0)
MCHC: 33.2 g/dL (ref 30.0–36.0)
MCV: 79.3 fL — ABNORMAL LOW (ref 80.0–100.0)
Monocytes Absolute: 0.6 10*3/uL (ref 0.1–1.0)
Monocytes Relative: 11 %
Neutro Abs: 2.3 10*3/uL (ref 1.7–7.7)
Neutrophils Relative %: 48 %
Platelet Count: 122 10*3/uL — ABNORMAL LOW (ref 150–400)
RBC: 3.04 MIL/uL — ABNORMAL LOW (ref 3.87–5.11)
RDW: 17.5 % — ABNORMAL HIGH (ref 11.5–15.5)
WBC Count: 4.9 10*3/uL (ref 4.0–10.5)
nRBC: 0 % (ref 0.0–0.2)

## 2023-01-31 LAB — CMP (CANCER CENTER ONLY)
ALT: 21 U/L (ref 0–44)
AST: 44 U/L — ABNORMAL HIGH (ref 15–41)
Albumin: 3 g/dL — ABNORMAL LOW (ref 3.5–5.0)
Alkaline Phosphatase: 78 U/L (ref 38–126)
Anion gap: 4 — ABNORMAL LOW (ref 5–15)
BUN: 18 mg/dL (ref 8–23)
CO2: 26 mmol/L (ref 22–32)
Calcium: 9.4 mg/dL (ref 8.9–10.3)
Chloride: 114 mmol/L — ABNORMAL HIGH (ref 98–111)
Creatinine: 1.51 mg/dL — ABNORMAL HIGH (ref 0.44–1.00)
GFR, Estimated: 34 mL/min — ABNORMAL LOW (ref >60)
Glucose, Bld: 96 mg/dL (ref 70–99)
Potassium: 3.5 mmol/L (ref 3.5–5.1)
Sodium: 144 mmol/L (ref 135–145)
Total Bilirubin: 1 mg/dL (ref <1.2)
Total Protein: 6.1 g/dL — ABNORMAL LOW (ref 6.5–8.1)

## 2023-01-31 MED ORDER — DEXAMETHASONE SODIUM PHOSPHATE 10 MG/ML IJ SOLN
10.0000 mg | Freq: Once | INTRAMUSCULAR | Status: AC
  Administered 2023-01-31: 10 mg via INTRAVENOUS
  Filled 2023-01-31: qty 1

## 2023-01-31 MED ORDER — FAM-TRASTUZUMAB DERUXTECAN-NXKI CHEMO 100 MG IV SOLR
4.4000 mg/kg | Freq: Once | INTRAVENOUS | Status: AC
  Administered 2023-01-31: 300 mg via INTRAVENOUS
  Filled 2023-01-31: qty 15

## 2023-01-31 MED ORDER — PALONOSETRON HCL INJECTION 0.25 MG/5ML
0.2500 mg | Freq: Once | INTRAVENOUS | Status: AC
  Administered 2023-01-31: 0.25 mg via INTRAVENOUS
  Filled 2023-01-31: qty 5

## 2023-01-31 MED ORDER — ACETAMINOPHEN 325 MG PO TABS
650.0000 mg | ORAL_TABLET | Freq: Once | ORAL | Status: AC
  Administered 2023-01-31: 650 mg via ORAL
  Filled 2023-01-31: qty 2

## 2023-01-31 MED ORDER — HEPARIN SOD (PORK) LOCK FLUSH 100 UNIT/ML IV SOLN
500.0000 [IU] | Freq: Once | INTRAVENOUS | Status: AC | PRN
Start: 2023-01-31 — End: 2023-01-31
  Administered 2023-01-31: 500 [IU]

## 2023-01-31 MED ORDER — SODIUM CHLORIDE 0.9 % IV SOLN
150.0000 mg | Freq: Once | INTRAVENOUS | Status: AC
  Administered 2023-01-31: 150 mg via INTRAVENOUS
  Filled 2023-01-31: qty 5
  Filled 2023-01-31: qty 150

## 2023-01-31 MED ORDER — DEXTROSE 5 % IV SOLN: INTRAVENOUS | Status: DC

## 2023-01-31 MED ORDER — SODIUM CHLORIDE 0.9% FLUSH
10.0000 mL | INTRAVENOUS | Status: DC | PRN
  Administered 2023-01-31: 10 mL

## 2023-01-31 MED ORDER — SODIUM CHLORIDE 0.9% FLUSH
10.0000 mL | Freq: Once | INTRAVENOUS | Status: AC
  Administered 2023-01-31: 10 mL

## 2023-01-31 MED ORDER — DIPHENHYDRAMINE HCL 25 MG PO CAPS
25.0000 mg | ORAL_CAPSULE | Freq: Once | ORAL | Status: AC
Start: 2023-01-31 — End: 2023-01-31
  Administered 2023-01-31: 25 mg via ORAL
  Filled 2023-01-31: qty 1

## 2023-01-31 NOTE — Patient Instructions (Signed)
[facility] - A DEPT OF [facility]  Discharge Instructions: Thank you for choosing [facility] to provide your oncology and hematology care.   If you have a lab appointment with the Cancer Center, please go directly to the Cancer Center and check in at the registration area.   Wear comfortable clothing and clothing appropriate for easy access to any Portacath or PICC line.   We strive to give you quality time with your provider. You may need to reschedule your appointment if you arrive late (15 or more minutes).  Arriving late affects you and other patients whose appointments are after yours.  Also, if you miss three or more appointments without notifying the office, you may be dismissed from the clinic at the provider's discretion.      For prescription refill requests, have your pharmacy contact our office and allow 72 hours for refills to be completed.    Today you received the following chemotherapy and/or immunotherapy agents: Enhertu      To help prevent nausea and vomiting after your treatment, we encourage you to take your nausea medication as directed.  BELOW ARE SYMPTOMS THAT SHOULD BE REPORTED IMMEDIATELY: *FEVER GREATER THAN 100.4 F (38 C) OR HIGHER *CHILLS OR SWEATING *NAUSEA AND VOMITING THAT IS NOT CONTROLLED WITH YOUR NAUSEA MEDICATION *UNUSUAL SHORTNESS OF BREATH *UNUSUAL BRUISING OR BLEEDING *URINARY PROBLEMS (pain or burning when urinating, or frequent urination) *BOWEL PROBLEMS (unusual diarrhea, constipation, pain near the anus) TENDERNESS IN MOUTH AND THROAT WITH OR WITHOUT PRESENCE OF ULCERS (sore throat, sores in mouth, or a toothache) UNUSUAL RASH, SWELLING OR PAIN  UNUSUAL VAGINAL DISCHARGE OR ITCHING   Items with * indicate a potential emergency and should be followed up as soon as possible or go to the Emergency Department if any problems should occur.  Please show the CHEMOTHERAPY ALERT CARD or IMMUNOTHERAPY  ALERT CARD at check-in to the Emergency Department and triage nurse.  Should you have questions after your visit or need to cancel or reschedule your appointment, please contact [facility] - A DEPT OF [facility]  Dept: [phone]  and follow the prompts.  Office hours are 8:00 a.m. to 4:30 p.m. Monday - Friday. Please note that voicemails left after 4:00 p.m. may not be returned until the following business day.  We are closed weekends and major holidays. You have access to a nurse at all times for urgent questions. Please call the main number to the clinic Dept: [phone] and follow the prompts.   For any non-urgent questions, you may also contact your provider using MyChart. We now offer e-Visits for anyone 53 and older to request care online for non-urgent symptoms. For details visit [email or web address].   Also download the MyChart app! Go to the app store, search "MyChart", open the app, select [facility], and log in with your MyChart username and password.  Fam-Trastuzumab Deruxtecan Injection What is this medication? FAM-TRASTUZUMAB DERUXTECAN (fam-tras TOOZ eu mab DER ux TEE kan) treats some types of cancer. It works by blocking a protein that causes cancer cells to grow and multiply. This helps to slow or stop the spread of cancer cells. This medicine may be used for other purposes; ask your health care provider or pharmacist if you have questions. COMMON BRAND NAME(S): ENHERTU What should I tell my care team before I take this medication? They need to know if you have any of these conditions: Heart disease Heart  failure Infection, especially a viral infection, such as chickenpox, cold sores, or herpes Liver disease Lung or breathing disease, such as asthma or COPD An unusual or allergic reaction to fam-trastuzumab deruxtecan, other medications, foods, dyes, or preservatives Pregnant or trying to get pregnant Breast-feeding How should I use  this medication? This medication is injected into a vein. It is given by your care team in a hospital or clinic setting. A special MedGuide will be given to you before each treatment. Be sure to read this information carefully each time. Talk to your care team about the use of this medication in children. Special care may be needed. Overdosage: If you think you have taken too much of this medicine contact a poison control center or emergency room at once. NOTE: This medicine is only for you. Do not share this medicine with others. What if I miss a dose? It is important not to miss your dose. Call your care team if you are unable to keep an appointment. What may interact with this medication? Interactions are not expected. This list may not describe all possible interactions. Give your health care provider a list of all the medicines, herbs, non-prescription drugs, or dietary supplements you use. Also tell them if you smoke, drink alcohol, or use illegal drugs. Some items may interact with your medicine. What should I watch for while using this medication? Visit your care team for regular checks on your progress. Tell your care team if your symptoms do not start to get better or if they get worse. Your condition will be monitored carefully while you are receiving this medication. Do not become pregnant while taking this medication or for 7 months after stopping it. Women should inform their care team if they wish to become pregnant or think they might be pregnant. Men should not father a child while taking this medication and for 4 months after stopping it. There is potential for serious side effects to an unborn child. Talk to your care team for more information. Do not breast-feed an infant while taking this medication or for 7 months after the last dose. This medication has caused decreased sperm counts in some men. This may make it more difficult to father a child. Talk to your care team if you  are concerned about your fertility. This medication may increase your risk to bruise or bleed. Call your care team if you notice any unusual bleeding. Be careful brushing or flossing your teeth or using a toothpick because you may get an infection or bleed more easily. If you have any dental work done, tell your dentist you are receiving this medication. This medication may cause dry eyes and blurred vision. If you wear contact lenses, you may feel some discomfort. Lubricating eye drops may help. See your care team if the problem does not go away or is severe. This medication may increase your risk of getting an infection. Call your care team for advice if you get a fever, chills, sore throat, or other symptoms of a cold or flu. Do not treat yourself. Try to avoid being around people who are sick. Avoid taking medications that contain aspirin, acetaminophen, ibuprofen, naproxen, or ketoprofen unless instructed by your care team. These medications may hide a fever. What side effects may I notice from receiving this medication? Side effects that you should report to your care team as soon as possible: Allergic reactions--skin rash, itching, hives, swelling of the face, lips, tongue, or throat Dry cough, shortness of breath  or trouble breathing Infection--fever, chills, cough, sore throat, wounds that don't heal, pain or trouble when passing urine, general feeling of discomfort or being unwell Heart failure--shortness of breath, swelling of the ankles, feet, or hands, sudden weight gain, unusual weakness or fatigue Unusual bruising or bleeding Side effects that usually do not require medical attention (report these to your care team if they continue or are bothersome): Constipation Diarrhea Hair loss Muscle pain Nausea Vomiting This list may not describe all possible side effects. Call your doctor for medical advice about side effects. You may report side effects to FDA at 1-800-FDA-1088. Where  should I keep my medication? This medication is given in a hospital or clinic. It will not be stored at home. NOTE: This sheet is a summary. It may not cover all possible information. If you have questions about this medicine, talk to your doctor, pharmacist, or health care provider.  2024 Elsevier/Gold Standard (2020-12-08 00:00:00)

## 2023-02-01 ENCOUNTER — Telehealth: Payer: Self-pay

## 2023-02-01 ENCOUNTER — Other Ambulatory Visit: Payer: Self-pay

## 2023-02-01 DIAGNOSIS — Z171 Estrogen receptor negative status [ER-]: Secondary | ICD-10-CM

## 2023-02-01 NOTE — Telephone Encounter (Addendum)
Returning Christy [name]'s vm from this am about how she was feeling. She stated in her message that she was doing well. Eating, drinking, and urinating well. She was calling to see also why I was calling.  Could not leave a message as her VM was full.

## 2023-02-01 NOTE — Telephone Encounter (Signed)
-----   Message from Nurse Ashby Dawes sent at 01/31/2023  4:18 PM EST ----- Regarding: First time/ Enhertu/ Dr Mosetta Putt pt Hello,  Pt had first time Enhertu today. Tolerated well.  Thanks!

## 2023-02-01 NOTE — Telephone Encounter (Signed)
LM for patient that this nurse was calling to see how they were doing after their treatment. Please call back to Dr. Latanya Maudlin nurse at [phone] if they have any questions or concerns regarding the treatment.

## 2023-02-04 ENCOUNTER — Other Ambulatory Visit: Payer: Self-pay

## 2023-02-06 NOTE — Assessment & Plan Note (Signed)
[id number] stage IV, grade 2-3, ER-/PR- HER2+, with nodal and pulmonary metastases -She initially self palpated a left breast mass around 06/2020. Work-up showed 2 masses in the left breast UOQ and 3 abnormal lymph nodes. 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 -PET scan 08/28/20 showed: hypermetabolism to left breast and axilla, retropectoral and supraclavicular adenopathy, and several 3-6 mm pulmonary nodules. -She started first-line Kadcyla on 09/11/20. She tolerates well with some fatigue, dose decreased with C4.  -She has had good clinical response, breast mass was no longer palpable on physical exam 06/09/21.  -last PET scan in 01/2022 showed two small lung nodule (5-84mm), slightly bigger since last scan, no other hypermetabolic lesions.   -Her restaging CT scan from June 17, 2022 showed multiple enlarging lung nodules, largest measuring 1 cm, previously 0.6 cm.  This is highly concerning for pulmonary metastasis with slight disease progression. No other new lesions -she tried tucatinib 150mg  twice daily but did not tolerate with excessive fatigue and drowsiness -she restarted Tucatinib at 50mg  bid last week , she is tolerating well -I increased Tucatinib to 100mg  bid on 5/29  -She did not tolerate well, with more fatigue, and unstable gait.  She also developed worsening renal function, I stopped Tucatinib on 08/15/2022 -She is clinically stable, renal function has returned to her baseline, but no improvement in her fatigue and gait.  Will continue holding tucatinib now and continue Kadcyla  -repeated CT from 10/03/2022 showed mild progression in lung mets (comparing to three previous scans), but overall low tumor burden and no additional new mets. So we decided to continue Kadcyla and monitor her disease closely  -She again had a multiple falls lately, and more fatigued.  We discussed a chemo break.  Chemo held after treatment on 10/05/2022 -due to disease progression, treatment changed  to Enhertu on  01/31/23

## 2023-02-07 ENCOUNTER — Inpatient Hospital Stay: Payer: Medicare HMO

## 2023-02-07 ENCOUNTER — Encounter: Payer: Self-pay | Admitting: Hematology

## 2023-02-07 ENCOUNTER — Inpatient Hospital Stay: Payer: Medicare HMO | Attending: Hematology

## 2023-02-07 ENCOUNTER — Inpatient Hospital Stay (HOSPITAL_BASED_OUTPATIENT_CLINIC_OR_DEPARTMENT_OTHER): Payer: Medicare HMO | Admitting: Hematology

## 2023-02-07 VITALS — BP 165/58 | HR 74 | Temp 98.6°F | Resp 18 | Wt 158.7 lb

## 2023-02-07 DIAGNOSIS — M4726 Other spondylosis with radiculopathy, lumbar region: Secondary | ICD-10-CM | POA: Insufficient documentation

## 2023-02-07 DIAGNOSIS — C7800 Secondary malignant neoplasm of unspecified lung: Secondary | ICD-10-CM | POA: Insufficient documentation

## 2023-02-07 DIAGNOSIS — R051 Acute cough: Secondary | ICD-10-CM | POA: Insufficient documentation

## 2023-02-07 DIAGNOSIS — T451X5A Adverse effect of antineoplastic and immunosuppressive drugs, initial encounter: Secondary | ICD-10-CM | POA: Diagnosis not present

## 2023-02-07 DIAGNOSIS — C50412 Malignant neoplasm of upper-outer quadrant of left female breast: Secondary | ICD-10-CM | POA: Insufficient documentation

## 2023-02-07 DIAGNOSIS — Z79899 Other long term (current) drug therapy: Secondary | ICD-10-CM | POA: Insufficient documentation

## 2023-02-07 DIAGNOSIS — D6481 Anemia due to antineoplastic chemotherapy: Secondary | ICD-10-CM | POA: Insufficient documentation

## 2023-02-07 DIAGNOSIS — M5116 Intervertebral disc disorders with radiculopathy, lumbar region: Secondary | ICD-10-CM | POA: Diagnosis not present

## 2023-02-07 DIAGNOSIS — J439 Emphysema, unspecified: Secondary | ICD-10-CM | POA: Diagnosis not present

## 2023-02-07 DIAGNOSIS — Z8673 Personal history of transient ischemic attack (TIA), and cerebral infarction without residual deficits: Secondary | ICD-10-CM | POA: Insufficient documentation

## 2023-02-07 DIAGNOSIS — J90 Pleural effusion, not elsewhere classified: Secondary | ICD-10-CM | POA: Diagnosis not present

## 2023-02-07 DIAGNOSIS — R296 Repeated falls: Secondary | ICD-10-CM | POA: Insufficient documentation

## 2023-02-07 DIAGNOSIS — D649 Anemia, unspecified: Secondary | ICD-10-CM

## 2023-02-07 DIAGNOSIS — Z1722 Progesterone receptor negative status: Secondary | ICD-10-CM | POA: Insufficient documentation

## 2023-02-07 DIAGNOSIS — Z171 Estrogen receptor negative status [ER-]: Secondary | ICD-10-CM

## 2023-02-07 DIAGNOSIS — Z7952 Long term (current) use of systemic steroids: Secondary | ICD-10-CM | POA: Insufficient documentation

## 2023-02-07 DIAGNOSIS — I2510 Atherosclerotic heart disease of native coronary artery without angina pectoris: Secondary | ICD-10-CM | POA: Insufficient documentation

## 2023-02-07 DIAGNOSIS — N200 Calculus of kidney: Secondary | ICD-10-CM | POA: Diagnosis not present

## 2023-02-07 DIAGNOSIS — R234 Changes in skin texture: Secondary | ICD-10-CM | POA: Diagnosis not present

## 2023-02-07 DIAGNOSIS — I6381 Other cerebral infarction due to occlusion or stenosis of small artery: Secondary | ICD-10-CM | POA: Insufficient documentation

## 2023-02-07 DIAGNOSIS — I700 Atherosclerosis of aorta: Secondary | ICD-10-CM | POA: Diagnosis not present

## 2023-02-07 LAB — CMP (CANCER CENTER ONLY)
ALT: 32 U/L (ref 0–44)
AST: 33 U/L (ref 15–41)
Albumin: 3 g/dL — ABNORMAL LOW (ref 3.5–5.0)
Alkaline Phosphatase: 84 U/L (ref 38–126)
Anion gap: 5 (ref 5–15)
BUN: 23 mg/dL (ref 8–23)
CO2: 25 mmol/L (ref 22–32)
Calcium: 8.9 mg/dL (ref 8.9–10.3)
Chloride: 112 mmol/L — ABNORMAL HIGH (ref 98–111)
Creatinine: 1.67 mg/dL — ABNORMAL HIGH (ref 0.44–1.00)
GFR, Estimated: 30 mL/min — ABNORMAL LOW (ref >60)
Glucose, Bld: 106 mg/dL — ABNORMAL HIGH (ref 70–99)
Potassium: 3.7 mmol/L (ref 3.5–5.1)
Sodium: 142 mmol/L (ref 135–145)
Total Bilirubin: 1 mg/dL (ref <1.2)
Total Protein: 5.7 g/dL — ABNORMAL LOW (ref 6.5–8.1)

## 2023-02-07 LAB — SAMPLE TO BLOOD BANK

## 2023-02-07 LAB — CBC WITH DIFFERENTIAL (CANCER CENTER ONLY)
Abs Immature Granulocytes: 0.05 10*3/uL (ref 0.00–0.07)
Basophils Absolute: 0 10*3/uL (ref 0.0–0.1)
Basophils Relative: 0 %
Eosinophils Absolute: 0.5 10*3/uL (ref 0.0–0.5)
Eosinophils Relative: 5 %
HCT: 24.3 % — ABNORMAL LOW (ref 36.0–46.0)
Hemoglobin: 8 g/dL — ABNORMAL LOW (ref 12.0–15.0)
Immature Granulocytes: 1 %
Lymphocytes Relative: 24 %
Lymphs Abs: 2.2 10*3/uL (ref 0.7–4.0)
MCH: 26.2 pg (ref 26.0–34.0)
MCHC: 32.9 g/dL (ref 30.0–36.0)
MCV: 79.7 fL — ABNORMAL LOW (ref 80.0–100.0)
Monocytes Absolute: 0.6 10*3/uL (ref 0.1–1.0)
Monocytes Relative: 7 %
Neutro Abs: 6 10*3/uL (ref 1.7–7.7)
Neutrophils Relative %: 63 %
Platelet Count: 124 10*3/uL — ABNORMAL LOW (ref 150–400)
RBC: 3.05 MIL/uL — ABNORMAL LOW (ref 3.87–5.11)
RDW: 17.7 % — ABNORMAL HIGH (ref 11.5–15.5)
WBC Count: 9.4 10*3/uL (ref 4.0–10.5)
nRBC: 0 % (ref 0.0–0.2)

## 2023-02-07 NOTE — Progress Notes (Signed)
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 Work-up showed 2 masses in the left breast UOQ and 3 abnormal lymph nodes. 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 -PET scan 08/28/20 showed: hypermetabolism to left breast and axilla, retropectoral and supraclavicular adenopathy, and several 3-6 mm pulmonary nodules. -She started first-line Kadcyla on 09/11/20. She tolerates well with some fatigue, dose decreased with C4.  -She has had good clinical response, breast mass was no longer palpable on physical exam 06/09/21.  -last PET scan in 01/2022 showed two small lung nodule (5-88mm), slightly bigger since last scan, no other hypermetabolic lesions.   -Her restaging CT scan from June 17, 2022 showed multiple enlarging lung nodules, largest measuring 1 cm, previously 0.6 cm.  This is highly concerning for pulmonary metastasis with slight disease progression. No other new lesions -she tried tucatinib 150mg  twice daily but did not tolerate with excessive fatigue and drowsiness -she restarted Tucatinib at 50mg  bid last week , she is tolerating well -I increased Tucatinib to 100mg  bid on 5/29  -She did not tolerate well, with more fatigue, and unstable gait.  She also developed worsening renal function, I stopped Tucatinib on 08/15/2022 -She is clinically stable, renal function has returned to her baseline, but no improvement in her fatigue and gait.  Will  continue holding tucatinib now and continue Kadcyla  -repeated CT from 10/03/2022 showed mild progression in lung mets (comparing to three previous scans), but overall low tumor burden and no additional new mets. So we decided to continue Kadcyla and monitor her disease closely  -She again had a multiple falls lately, and more fatigued.  We discussed a chemo break.  Chemo held after treatment on 10/05/2022 -due to disease progression, treatment changed to Enhertu on  01/31/23    Assessment and Plan    Breast Cancer 82 year old with breast cancer undergoing chemotherapy. Recently received first dose of Enhertu on January 31, 2023. Reports nausea and weakness post-treatment. Hemoglobin improved from 5.5 to 8.0 post-transfusion. Kidney and liver function well-managed. No significant diarrhea or vomiting. Noted 2-pound weight loss. Explained [id number] potential increased difficulty compared to Ketocidil but with similar goals. Discussed hydration, balanced diet, and regular movement to manage side effects and support recovery. - Continue chemotherapy with next cycle on February 21, 2023 - Encourage hydration, preferably with water - Promote balanced diet and regular movement - Monitor blood counts and overall health status  Anemia Anemia secondary to chemotherapy. Hemoglobin stable at 8.0 post-transfusion. No additional transfusion needed at this time. - Monitor hemoglobin levels - Encourage hydration and nutrition to support recovery  Fall Risk Increased fall risk due to weakness and leg issues. Reports frequent falls when walking outside. Uses a walker but has stability issues. Advised to seek medical device store for walker adjustment and to avoid going out alone, recommending accompaniment by family members. - Refer to medical device store for walker adjustment - Advise against going out alone; recommend accompaniment by family members - Encourage  consistent use of walker when  outside  General Health Maintenance Prefers coffee but open to trying decaf. - Encourage drinking more water - Suggest trying decaf coffee to reduce caffeine intake  Plan -She tolerated first cycle of Enhertu moderate, will continue monitoring - Follow-up visit on February 21, 2023 at 9:30 AM before her second cycle of treatment       SUMMARY OF ONCOLOGIC HISTORY: Oncology History Overview Note  Cancer Staging Malignant neoplasm of upper-outer quadrant of female breast [facility]) Staging form: Breast, AJCC 8th Edition - Clinical stage from 07/31/2020: Stage IV (cT2, cN3, cM1, G2, ER-, PR-, HER2+) - Signed by Pollyann Samples, NP on 08/31/2020 Stage prefix: Initial diagnosis Nuclear grade: G2 Histologic grading system: 3 grade system    Malignant neoplasm of upper-outer quadrant of female breast (HCC)  07/24/2020 Breast US   On physical exam, a very firm mass is identified at the 1:30 position of the LEFT breast 5 cm from the nipple.   IMPRESSION: 1. Highly suspicious 4.3 cm mass at the 1:30 position of the LEFT breast and highly suspicious 2.7 cm mass at the 2 o'clock position of the LEFT breast, with the 2 masses encompassing an area measuring at least 6.1 cm. 2. 3 abnormal LEFT axillary lymph nodes with cortical thickening, tissue sampling of 1 of these lymph nodes recommended. 3. Anterior LEFT breast skin thickening nonspecific but may reflect dermal involvement. 4. No mammographic evidence of RIGHT breast malignancy.   07/31/2020 Initial Biopsy   Diagnosis 1. Breast, left, needle core biopsy, 1 :30 pm, 5cmfn - INVASIVE DUCTAL CARCINOMA, SEE COMMENT. 2. Breast, left, needle core biopsy, 2 o'clock, 8cmfn - INVASIVE DUCTAL CARCINOMA, SEE COMMENT. - DUCTAL CARCINOMA IN SITU. 3. Lymph node, needle/core biopsy, left axillary - METASTATIC CARCINOMA IN A LYMPH NODE. Microscopic Comment 1. and 3. The carcinoma in both parts has a similar morphology and appears grade 2-3. The DCIS in  part 2 is high-grade with necrosis. The carcinoma measures 15 mm (part 1) and 14 mm (part 2) in greatest linear extent  2. PROGNOSTIC INDICATORS Results: IMMUNOHISTOCHEMICAL AND MORPHOMETRIC ANALYSIS PERFORMED MANUALLY The tumor cells are POSITIVE for Her2 (3+). Estrogen Receptor: 0%, NEGATIVE Progesterone Receptor: 0%, NEGATIVE Proliferation Marker Ki67: 20%    07/31/2020 Cancer Staging   Staging form: Breast, AJCC 8th Edition - Clinical stage from 07/31/2020: Stage IV (cT2, cN3, cM1, G2, ER-, PR-, HER2+) - Signed by Pollyann Samples, NP on 08/31/2020 Stage prefix: Initial diagnosis Nuclear grade: G2 Histologic grading system: 3 grade system   08/13/2020 Initial Diagnosis   Malignant neoplasm of upper-outer quadrant of female breast (HCC)   08/28/2020 PET scan   IMPRESSION: Signs of LEFT breast cancer with LEFT axillary and retropectoral adenopathy as well as LEFT supraclavicular adenopathy.   Signs of pulmonary metastatic disease.   Mild asymmetric uptake in the LEFT as compared to the RIGHT adrenal gland. Equivocal but suspicious, attention on follow-up.   Aortic atherosclerosis.   Cystic changes and potential bronchiectasis in the RIGHT lung base likely post infectious or inflammatory. Correlate with any respiratory symptoms. Comparison with prior imaging may be helpful with attention on follow-up.   08/28/2020 Imaging   BRAIN MRI IMPRESSION: 1. Subacute lacunar infarct of the left basal ganglia with no malignant hemorrhagic transformation or mass effect. Underlying chronic bilateral basal ganglia ischemia. 2. No metastatic disease or other acute intracranial abnormality identified.   08/28/2020 Imaging   Breast MRI   IMPRESSION: 1. 8.1 x 8.1 x 5.1 cm  area of biopsy-proven malignancy in the central, upper outer and lower outer quadrants of the left breast. 2. Diffuse anterior left breast skin thickening without abnormal enhancement. This may represent thickening due to  lymphedema associated with lymphatic obstruction. 3. Metastatic level 1 and level 2 left axillary adenopathy. 4. No evidence of malignancy on the right.   09/11/2020 - 11/03/2021 Chemotherapy   Patient is on Treatment Plan : BREAST ADO-Trastuzumab Emtansine (Kadcyla) q21d     09/11/2020 - 01/04/2023 Chemotherapy   Patient is on Treatment Plan : BREAST ADO-Trastuzumab Emtansine (Kadcyla) q21d     11/30/2020 Imaging   CT Chest w/o contrast  IMPRESSION: 1. Interval response to therapy. The left breast mass has mildly decreased in size in the interval. 2. Decrease in size of left axillary and left retropectoral lymph nodes. 3. Small right lung nodules stable to improved. 4. Coronary artery calcifications noted. 5. Aortic Atherosclerosis (ICD10-I70.0).   03/04/2021 PET scan   IMPRESSION: 1. Overall marked improvement compared to the prior PET-CT, with resolution of hypermetabolic activity and pathologic enlargement of the previously involved lymph nodes; lack of hypermetabolic activity and nonvisualization of prior right-sided lung nodules, and substantial reduction in size and activity in the left breast primary. 2. New ground-glass opacity medially in the right lower lobe with low-grade metabolic activity, highly likely to be inflammatory (alveolitis) given the morphology and appearance. 3. Other imaging findings of potential clinical significance: Aortic Atherosclerosis (ICD10-I70.0). Coronary atherosclerosis. Mitral valve calcifications. Punctate nonobstructive left renal calculi. Lower lumbar impingement due to chronic spondylosis and degenerative disc disease. Remote left basal ganglia infarct.   06/07/2021 Imaging   EXAM: CT CHEST, ABDOMEN AND PELVIS WITHOUT CONTRAST  IMPRESSION: 1. Scattered tiny bilateral pulmonary nodules are similar to the prior exams, favored to be benign. 2. Otherwise, no evidence of metastatic disease within the chest, abdomen, or pelvis. 3. Right lower  lobe ground-glass nodule has resolved since the prior PET 4.  Possible constipation. 5. Coronary artery atherosclerosis. Aortic Atherosclerosis (ICD10-I70.0). Emphysema (ICD10-J43.9). 6. Left nephrolithiasis.   06/15/2022 Imaging    IMPRESSION: 1. Mild increase in size of bilateral pulmonary nodules compatible with progression of metastatic disease. 2. No evidence for metastatic disease within the abdomen or pelvis. 3. Large volume of desiccated stool identified within the rectum. Correlate for any clinical signs or symptoms of constipation. 4. Nonobstructing left renal calculus. 5. Aortic Atherosclerosis (ICD10-I70.0).     10/03/2022 Imaging    IMPRESSION: 1. Mild but definite progression of pulmonary metastasis. 2. No extrathoracic metastatic disease identified. 3. Incidental findings, including: Left nephrolithiasis. Coronary artery atherosclerosis. Aortic Atherosclerosis (ICD10-I70.0). Possible constipation.   01/31/2023 -  Chemotherapy   Patient is on Treatment Plan : BREAST METASTATIC Fam-Trastuzumab Deruxtecan-nxki (Enhertu) (5.4) q21d        Discussed the use of AI scribe software for clinical note transcription with the patient, who gave verbal consent to proceed.  History of Present Illness   The patient, an 82 year old female with a history of breast cancer, presents for follow-up after her first round of chemotherapy. She reports feeling weak and nauseous since her last treatment, which was two days before Thanksgiving. She denies vomiting and diarrhea, but admits to losing two pounds. She also mentions that she has been feeling dizzy. Despite these symptoms, she has been trying to stay active by walking around her home. She also mentions a problem with her walker, which she uses when she goes out due to a tendency to fall. She is currently trying to get  a brace for her leg, but has been having issues with her insurance.         All other systems were reviewed with  the patient and are negative.  MEDICAL HISTORY:  Past Medical History:  Diagnosis Date   Diabetes mellitus without complication (HCC)    Hyperlipidemia    Hypertension     SURGICAL HISTORY: Past Surgical History:  Procedure Laterality Date   FOOT FRACTURE SURGERY Right    IR IMAGING GUIDED PORT INSERTION  09/22/2020   IR RADIOLOGIST EVAL & MGMT  09/25/2020    I have reviewed the social history and family history with the patient and they are unchanged from previous note.  ALLERGIES:  has No Known Allergies.  MEDICATIONS:  Current Outpatient Medications  Medication Sig Dispense Refill   aspirin EC 325 MG tablet Take 1 tablet (325 mg total) by mouth daily. 60 tablet 3   atorvastatin (LIPITOR) 20 MG tablet Take 1 tablet by mouth once daily 60 tablet 0   dexamethasone (DECADRON) 4 MG tablet Take 2 tablets (8 mg) by mouth daily for 3 days starting the day after chemotherapy. Take with food. 30 tablet 1   ferrous sulfate 325 (65 FE) MG EC tablet Take 1 tablet (325 mg total) by mouth daily with breakfast. 30 tablet 3   lidocaine-prilocaine (EMLA) cream Apply 1 Application topically as needed. 30 g 2   lidocaine-prilocaine (EMLA) cream Apply to affected area once 30 g 3   metFORMIN (GLUCOPHAGE) 500 MG tablet Take 1 tablet (500 mg total) by mouth 2 (two) times daily with a meal. 60 tablet 1   olmesartan (BENICAR) 40 MG tablet Take 1 tablet (40 mg total) by mouth daily. 30 tablet 0   ondansetron (ZOFRAN) 8 MG tablet Take 1 tablet (8 mg total) by mouth every 8 (eight) hours as needed for nausea or vomiting. Start on the third day after chemotherapy. 30 tablet 1   potassium chloride SA (KLOR-CON M) 20 MEQ tablet Take 1 tablet by mouth once daily 30 tablet 0   prochlorperazine (COMPAZINE) 10 MG tablet Take 1 tablet (10 mg total) by mouth every 6 (six) hours as needed for nausea or vomiting. 30 tablet 1   No current facility-administered medications for this visit.    PHYSICAL  EXAMINATION: ECOG PERFORMANCE STATUS: 2 - Symptomatic, <50% confined to bed  Vitals:   02/07/23 0931  BP: (!) 165/58  Pulse: 74  Resp: 18  Temp: 98.6 F (37 C)  SpO2: 100%   Wt Readings from Last 3 Encounters:  02/07/23 158 lb 11.2 oz (72 kg)  01/31/23 160 lb (72.6 kg)  01/25/23 155 lb 12.8 oz (70.7 kg)     GENERAL:alert, no distress and comfortable SKIN: skin color, texture, turgor are normal, no rashes or significant lesions EYES: normal, Conjunctiva are pink and non-injected, sclera clear NECK: supple, thyroid normal size, non-tender, without nodularity LYMPH:  no palpable lymphadenopathy in the cervical, axillary  LUNGS: clear to auscultation and percussion with normal breathing effort HEART: regular rate & rhythm and no murmurs and no lower extremity edema ABDOMEN:abdomen soft, non-tender and normal bowel sounds Musculoskeletal:no cyanosis of digits and no clubbing  NEURO: alert & oriented x 3 with fluent speech, no focal motor/sensory deficits    LABORATORY DATA:  I have reviewed the data as listed    Latest Ref Rng & Units 02/07/2023    8:26 AM 01/31/2023    9:41 AM 01/25/2023   10:29 AM  CBC  WBC  4.0 - 10.5 K/uL 9.4  4.9    Hemoglobin 12.0 - 15.0 g/dL 8.0  8.0  5.5   Hematocrit 36.0 - 46.0 % 24.3  24.1  16.4    17.5   Platelets 150 - 400 K/uL 124  122          Latest Ref Rng & Units 02/07/2023    8:26 AM 01/31/2023    9:41 AM 01/25/2023    9:12 AM  CMP  Glucose 70 - 99 mg/dL 657  96  846   BUN 8 - 23 mg/dL 23  18  23    Creatinine 0.44 - 1.00 mg/dL 9.62  9.52  8.41   Sodium 135 - 145 mmol/L 142  144  143   Potassium 3.5 - 5.1 mmol/L 3.7  3.5  3.4   Chloride 98 - 111 mmol/L 112  114  112   CO2 22 - 32 mmol/L 25  26  27    Calcium 8.9 - 10.3 mg/dL 8.9  9.4  9.5   Total Protein 6.5 - 8.1 g/dL 5.7  6.1  6.0   Total Bilirubin <1.2 mg/dL 1.0  1.0  0.9   Alkaline Phos 38 - 126 U/L 84  78  87   AST 15 - 41 U/L 33  44  49   ALT 0 - 44 U/L 32  21  24        RADIOGRAPHIC STUDIES: I have personally reviewed the radiological images as listed and agreed with the findings in the report. No results found.    No orders of the defined types were placed in this encounter.  All questions were answered. The patient knows to call the clinic with any problems, questions or concerns. No barriers to learning was detected. The total time spent in the appointment was 20 minutes.     Malachy Mood, MD 02/07/2023

## 2023-02-15 ENCOUNTER — Ambulatory Visit: Payer: Medicare HMO | Admitting: Nurse Practitioner

## 2023-02-15 ENCOUNTER — Other Ambulatory Visit: Payer: Medicare HMO

## 2023-02-15 ENCOUNTER — Ambulatory Visit: Payer: Medicare HMO

## 2023-02-19 NOTE — Assessment & Plan Note (Signed)
 [id number] stage IV, grade 2-3, ER-/PR- HER2+, with nodal and pulmonary metastases -She initially self palpated a left breast mass around 06/2020. Work-up showed 2 masses in the left breast UOQ and 3 abnormal lymph nodes. 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 -PET scan 08/28/20 showed: hypermetabolism to left breast and axilla, retropectoral and supraclavicular adenopathy, and several 3-6 mm pulmonary nodules. -She started first-line Kadcyla on 09/11/20. She tolerates well with some fatigue, dose decreased with C4.  -She has had good clinical response, breast mass was no longer palpable on physical exam 06/09/21.  -last PET scan in 01/2022 showed two small lung nodule (5-84mm), slightly bigger since last scan, no other hypermetabolic lesions.   -Her restaging CT scan from June 17, 2022 showed multiple enlarging lung nodules, largest measuring 1 cm, previously 0.6 cm.  This is highly concerning for pulmonary metastasis with slight disease progression. No other new lesions -she tried tucatinib 150mg  twice daily but did not tolerate with excessive fatigue and drowsiness -she restarted Tucatinib at 50mg  bid last week , she is tolerating well -I increased Tucatinib to 100mg  bid on 5/29  -She did not tolerate well, with more fatigue, and unstable gait.  She also developed worsening renal function, I stopped Tucatinib on 08/15/2022 -She is clinically stable, renal function has returned to her baseline, but no improvement in her fatigue and gait.  Will continue holding tucatinib now and continue Kadcyla  -repeated CT from 10/03/2022 showed mild progression in lung mets (comparing to three previous scans), but overall low tumor burden and no additional new mets. So we decided to continue Kadcyla and monitor her disease closely  -She again had a multiple falls lately, and more fatigued.  We discussed a chemo break.  Chemo held after treatment on 10/05/2022 -due to disease progression, treatment changed  to Enhertu on  01/31/23

## 2023-02-20 MED FILL — Fosaprepitant Dimeglumine For IV Infusion 150 MG (Base Eq): INTRAVENOUS | Qty: 5 | Status: AC

## 2023-02-21 ENCOUNTER — Inpatient Hospital Stay (HOSPITAL_BASED_OUTPATIENT_CLINIC_OR_DEPARTMENT_OTHER): Payer: Medicare HMO | Admitting: Hematology

## 2023-02-21 ENCOUNTER — Encounter: Payer: Self-pay | Admitting: Hematology

## 2023-02-21 ENCOUNTER — Ambulatory Visit (HOSPITAL_COMMUNITY)
Admission: RE | Admit: 2023-02-21 | Discharge: 2023-02-21 | Disposition: A | Discharge status: Home / Self Care | Payer: Medicare HMO | Source: Ambulatory Visit | Attending: Hematology | Admitting: Hematology

## 2023-02-21 ENCOUNTER — Inpatient Hospital Stay: Payer: Medicare HMO

## 2023-02-21 VITALS — BP 166/61 | HR 76 | Temp 98.1°F | Resp 20

## 2023-02-21 VITALS — BP 165/60 | HR 71 | Temp 97.3°F | Resp 17 | Wt 156.2 lb

## 2023-02-21 DIAGNOSIS — D649 Anemia, unspecified: Secondary | ICD-10-CM

## 2023-02-21 DIAGNOSIS — Z171 Estrogen receptor negative status [ER-]: Secondary | ICD-10-CM | POA: Diagnosis not present

## 2023-02-21 DIAGNOSIS — C50412 Malignant neoplasm of upper-outer quadrant of left female breast: Secondary | ICD-10-CM | POA: Diagnosis not present

## 2023-02-21 DIAGNOSIS — R051 Acute cough: Secondary | ICD-10-CM | POA: Insufficient documentation

## 2023-02-21 DIAGNOSIS — Z95828 Presence of other vascular implants and grafts: Secondary | ICD-10-CM

## 2023-02-21 LAB — CMP (CANCER CENTER ONLY)
ALT: 24 U/L (ref 0–44)
AST: 39 U/L (ref 15–41)
Albumin: 3 g/dL — ABNORMAL LOW (ref 3.5–5.0)
Alkaline Phosphatase: 99 U/L (ref 38–126)
Anion gap: 4 — ABNORMAL LOW (ref 5–15)
BUN: 16 mg/dL (ref 8–23)
CO2: 25 mmol/L (ref 22–32)
Calcium: 9.3 mg/dL (ref 8.9–10.3)
Chloride: 114 mmol/L — ABNORMAL HIGH (ref 98–111)
Creatinine: 1.24 mg/dL — ABNORMAL HIGH (ref 0.44–1.00)
GFR, Estimated: 43 mL/min — ABNORMAL LOW (ref >60)
Glucose, Bld: 99 mg/dL (ref 70–99)
Potassium: 3.5 mmol/L (ref 3.5–5.1)
Sodium: 143 mmol/L (ref 135–145)
Total Bilirubin: 0.6 mg/dL (ref <1.2)
Total Protein: 5.7 g/dL — ABNORMAL LOW (ref 6.5–8.1)

## 2023-02-21 LAB — CBC WITH DIFFERENTIAL (CANCER CENTER ONLY)
Abs Immature Granulocytes: 0.01 10*3/uL (ref 0.00–0.07)
Basophils Absolute: 0 10*3/uL (ref 0.0–0.1)
Basophils Relative: 1 %
Eosinophils Absolute: 0.8 10*3/uL — ABNORMAL HIGH (ref 0.0–0.5)
Eosinophils Relative: 15 %
HCT: 24.5 % — ABNORMAL LOW (ref 36.0–46.0)
Hemoglobin: 7.9 g/dL — ABNORMAL LOW (ref 12.0–15.0)
Immature Granulocytes: 0 %
Lymphocytes Relative: 37 %
Lymphs Abs: 2 10*3/uL (ref 0.7–4.0)
MCH: 26.1 pg (ref 26.0–34.0)
MCHC: 32.2 g/dL (ref 30.0–36.0)
MCV: 80.9 fL (ref 80.0–100.0)
Monocytes Absolute: 0.4 10*3/uL (ref 0.1–1.0)
Monocytes Relative: 8 %
Neutro Abs: 2.1 10*3/uL (ref 1.7–7.7)
Neutrophils Relative %: 39 %
Platelet Count: 205 10*3/uL (ref 150–400)
RBC: 3.03 MIL/uL — ABNORMAL LOW (ref 3.87–5.11)
RDW: 18.7 % — ABNORMAL HIGH (ref 11.5–15.5)
WBC Count: 5.4 10*3/uL (ref 4.0–10.5)
nRBC: 0 % (ref 0.0–0.2)

## 2023-02-21 LAB — PREPARE RBC (CROSSMATCH)

## 2023-02-21 LAB — FERRITIN: Ferritin: 61 ng/mL (ref 11–307)

## 2023-02-21 LAB — SAMPLE TO BLOOD BANK

## 2023-02-21 MED ORDER — SODIUM CHLORIDE 0.9% FLUSH
10.0000 mL | Freq: Once | INTRAVENOUS | Status: AC
  Administered 2023-02-21: 10 mL

## 2023-02-21 MED ORDER — DIPHENHYDRAMINE HCL 25 MG PO CAPS
25.0000 mg | ORAL_CAPSULE | Freq: Once | ORAL | Status: AC
  Administered 2023-02-21: 25 mg via ORAL
  Filled 2023-02-21: qty 1

## 2023-02-21 MED ORDER — FAM-TRASTUZUMAB DERUXTECAN-NXKI CHEMO 100 MG IV SOLR
4.4000 mg/kg | Freq: Once | INTRAVENOUS | Status: AC
  Administered 2023-02-21: 300 mg via INTRAVENOUS
  Filled 2023-02-21: qty 15

## 2023-02-21 MED ORDER — DEXTROSE 5 % IV SOLN: INTRAVENOUS | Status: DC

## 2023-02-21 MED ORDER — SODIUM CHLORIDE 0.9% FLUSH
10.0000 mL | INTRAVENOUS | Status: DC | PRN
  Administered 2023-02-21: 10 mL

## 2023-02-21 MED ORDER — FOSAPREPITANT DIMEGLUMINE INJECTION 150 MG
150.0000 mg | Freq: Once | INTRAVENOUS | Status: AC
  Administered 2023-02-21: 150 mg via INTRAVENOUS
  Filled 2023-02-21: qty 150

## 2023-02-21 MED ORDER — ACETAMINOPHEN 325 MG PO TABS
650.0000 mg | ORAL_TABLET | Freq: Once | ORAL | Status: AC
Start: 2023-02-21 — End: 2023-02-21
  Administered 2023-02-21: 650 mg via ORAL
  Filled 2023-02-21: qty 2

## 2023-02-21 MED ORDER — HEPARIN SOD (PORK) LOCK FLUSH 100 UNIT/ML IV SOLN
500.0000 [IU] | Freq: Once | INTRAVENOUS | Status: AC | PRN
  Administered 2023-02-21: 500 [IU]

## 2023-02-21 MED ORDER — PALONOSETRON HCL INJECTION 0.25 MG/5ML
0.2500 mg | Freq: Once | INTRAVENOUS | Status: AC
  Administered 2023-02-21: 0.25 mg via INTRAVENOUS
  Filled 2023-02-21: qty 5

## 2023-02-21 MED ORDER — DEXAMETHASONE SODIUM PHOSPHATE 10 MG/ML IJ SOLN
10.0000 mg | Freq: Once | INTRAMUSCULAR | Status: AC
  Administered 2023-02-21: 10 mg via INTRAVENOUS
  Filled 2023-02-21: qty 1

## 2023-02-21 NOTE — Progress Notes (Signed)
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 Work-up showed 2 masses in the left breast UOQ and 3 abnormal lymph nodes. 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 -PET scan 08/28/20 showed: hypermetabolism to left breast and axilla, retropectoral and supraclavicular adenopathy, and several 3-6 mm pulmonary nodules. -She started first-line Kadcyla on 09/11/20. She tolerates well with some fatigue, dose decreased with C4.  -She has had good clinical response, breast mass was no longer palpable on physical exam 06/09/21.  -last PET scan in 01/2022 showed two small lung nodule (5-4mm), slightly bigger since last scan, no other hypermetabolic lesions.   -Her restaging CT scan from June 17, 2022 showed multiple enlarging lung nodules, largest measuring 1 cm, previously 0.6 cm.  This is highly concerning for pulmonary metastasis with slight disease progression. No other new lesions -she tried tucatinib 150mg  twice daily but did not tolerate with excessive fatigue and drowsiness -she restarted Tucatinib at 50mg  bid last week , she is tolerating well -I increased Tucatinib to 100mg  bid on 5/29  -She did not tolerate well, with more fatigue, and unstable gait.  She also developed worsening renal function, I stopped Tucatinib on 08/15/2022 -She is clinically stable, renal function has returned to her baseline, but no improvement in her fatigue and  gait.  Will continue holding tucatinib now and continue Kadcyla  -repeated CT from 10/03/2022 showed mild progression in lung mets (comparing to three previous scans), but overall low tumor burden and no additional new mets. So we decided to continue Kadcyla and monitor her disease closely  -She again had a multiple falls lately, and more fatigued.  We discussed a chemo break.  Chemo held after treatment on 10/05/2022 -due to disease progression, treatment changed to Enhertu on  01/31/23     Assessment and Plan    Metastatic Breast Cancer Follow-up for metastatic breast cancer. Reports intermittent leg weakness without falls. Currently on a new chemotherapy regimen started on January 31, 2023. No significant adverse effects from the reduced dose except fatigue and cough, will continue at the reduced dose.  Informed about dose or frequency adjustments if fatigue becomes problematic. Hemoglobin at 7.9, indicating need for blood transfusion. - Administer one unit blood transfusion tomorrow - Schedule next chemotherapy cycle for March 14, 2023 - Instruct to report any worsening symptoms, including cough or fatigue  Anemia Hemoglobin level at 7.9, consistent with anemia. Requires a blood transfusion. - Administer one unit blood transfusion - Monitor hemoglobin levels and adjust treatment as necessary  Cough Intermittent dry cough for two weeks, sometimes nocturnal. Physical exam suggests possible right lung inflammation. - Order chest X-ray - Evaluate X-ray results and follow up as needed  Plan -Lab reviewed, adequate for treatment, will proceed to second cycle of Enhertu at the same reduced dose and continue every 3 weeks -Due to her cough, I did chest x-ray today, which was unremarkable except mild right pleural effusion - Schedule follow-up visit for March 14, 2023 -  Instruct to report any worsening symptoms or new issues before the next visit.         SUMMARY OF ONCOLOGIC  HISTORY: Oncology History Overview Note  Cancer Staging Malignant neoplasm of upper-outer quadrant of female breast (HCC) Staging form: Breast, AJCC 8th Edition - Clinical stage from 07/31/2020: Stage IV (cT2, cN3, cM1, G2, ER-, PR-, HER2+) - Signed by Pollyann Samples, NP on 08/31/2020 Stage prefix: Initial diagnosis Nuclear grade: G2 Histologic grading system: 3 grade system    Malignant neoplasm of upper-outer quadrant of female breast (HCC)  07/24/2020 Breast US   On physical exam, a very firm mass is identified at the 1:30 position of the LEFT breast 5 cm from the nipple.   IMPRESSION: 1. Highly suspicious 4.3 cm mass at the 1:30 position of the LEFT breast and highly suspicious 2.7 cm mass at the 2 o'clock position of the LEFT breast, with the 2 masses encompassing an area measuring at least 6.1 cm. 2. 3 abnormal LEFT axillary lymph nodes with cortical thickening, tissue sampling of 1 of these lymph nodes recommended. 3. Anterior LEFT breast skin thickening nonspecific but may reflect dermal involvement. 4. No mammographic evidence of RIGHT breast malignancy.   07/31/2020 Initial Biopsy   Diagnosis 1. Breast, left, needle core biopsy, 1 :30 pm, 5cmfn - INVASIVE DUCTAL CARCINOMA, SEE COMMENT. 2. Breast, left, needle core biopsy, 2 o'clock, 8cmfn - INVASIVE DUCTAL CARCINOMA, SEE COMMENT. - DUCTAL CARCINOMA IN SITU. 3. Lymph node, needle/core biopsy, left axillary - METASTATIC CARCINOMA IN A LYMPH NODE. Microscopic Comment 1. and 3. The carcinoma in both parts has a similar morphology and appears grade 2-3. The DCIS in part 2 is high-grade with necrosis. The carcinoma measures 15 mm (part 1) and 14 mm (part 2) in greatest linear extent  2. PROGNOSTIC INDICATORS Results: IMMUNOHISTOCHEMICAL AND MORPHOMETRIC ANALYSIS PERFORMED MANUALLY The tumor cells are POSITIVE for Her2 (3+). Estrogen Receptor: 0%, NEGATIVE Progesterone Receptor: 0%, NEGATIVE Proliferation Marker Ki67:  20%    07/31/2020 Cancer Staging   Staging form: Breast, AJCC 8th Edition - Clinical stage from 07/31/2020: Stage IV (cT2, cN3, cM1, G2, ER-, PR-, HER2+) - Signed by Pollyann Samples, NP on 08/31/2020 Stage prefix: Initial diagnosis Nuclear grade: G2 Histologic grading system: 3 grade system   08/13/2020 Initial Diagnosis   Malignant neoplasm of upper-outer quadrant of female breast (HCC)   08/28/2020 PET scan   IMPRESSION: Signs of LEFT breast cancer with LEFT axillary and retropectoral adenopathy as well as LEFT supraclavicular adenopathy.   Signs of pulmonary metastatic disease.   Mild asymmetric uptake in the LEFT as compared to the RIGHT adrenal gland. Equivocal but suspicious, attention on follow-up.   Aortic atherosclerosis.   Cystic changes and potential bronchiectasis in the RIGHT lung base likely post infectious or inflammatory. Correlate with any respiratory symptoms. Comparison with prior imaging may be helpful with attention on follow-up.   08/28/2020 Imaging   BRAIN MRI IMPRESSION: 1. Subacute lacunar infarct of the left basal ganglia with no malignant hemorrhagic transformation or mass effect. Underlying chronic bilateral basal ganglia ischemia. 2. No metastatic disease or other acute intracranial abnormality identified.   08/28/2020 Imaging   Breast MRI   IMPRESSION: 1. 8.1 x 8.1 x 5.1 cm area of biopsy-proven malignancy in the central, upper outer and lower outer quadrants of the left breast. 2. Diffuse anterior left breast skin thickening without abnormal enhancement. This may represent thickening due to lymphedema associated with lymphatic obstruction. 3. Metastatic level 1 and level  2 left axillary adenopathy. 4. No evidence of malignancy on the right.   09/11/2020 - 11/03/2021 Chemotherapy   Patient is on Treatment Plan : BREAST ADO-Trastuzumab Emtansine (Kadcyla) q21d     09/11/2020 - 01/04/2023 Chemotherapy   Patient is on Treatment Plan : BREAST  ADO-Trastuzumab Emtansine (Kadcyla) q21d     11/30/2020 Imaging   CT Chest w/o contrast  IMPRESSION: 1. Interval response to therapy. The left breast mass has mildly decreased in size in the interval. 2. Decrease in size of left axillary and left retropectoral lymph nodes. 3. Small right lung nodules stable to improved. 4. Coronary artery calcifications noted. 5. Aortic Atherosclerosis (ICD10-I70.0).   03/04/2021 PET scan   IMPRESSION: 1. Overall marked improvement compared to the prior PET-CT, with resolution of hypermetabolic activity and pathologic enlargement of the previously involved lymph nodes; lack of hypermetabolic activity and nonvisualization of prior right-sided lung nodules, and substantial reduction in size and activity in the left breast primary. 2. New ground-glass opacity medially in the right lower lobe with low-grade metabolic activity, highly likely to be inflammatory (alveolitis) given the morphology and appearance. 3. Other imaging findings of potential clinical significance: Aortic Atherosclerosis (ICD10-I70.0). Coronary atherosclerosis. Mitral valve calcifications. Punctate nonobstructive left renal calculi. Lower lumbar impingement due to chronic spondylosis and degenerative disc disease. Remote left basal ganglia infarct.   06/07/2021 Imaging   EXAM: CT CHEST, ABDOMEN AND PELVIS WITHOUT CONTRAST  IMPRESSION: 1. Scattered tiny bilateral pulmonary nodules are similar to the prior exams, favored to be benign. 2. Otherwise, no evidence of metastatic disease within the chest, abdomen, or pelvis. 3. Right lower lobe ground-glass nodule has resolved since the prior PET 4.  Possible constipation. 5. Coronary artery atherosclerosis. Aortic Atherosclerosis (ICD10-I70.0). Emphysema (ICD10-J43.9). 6. Left nephrolithiasis.   06/15/2022 Imaging    IMPRESSION: 1. Mild increase in size of bilateral pulmonary nodules compatible with progression of metastatic  disease. 2. No evidence for metastatic disease within the abdomen or pelvis. 3. Large volume of desiccated stool identified within the rectum. Correlate for any clinical signs or symptoms of constipation. 4. Nonobstructing left renal calculus. 5. Aortic Atherosclerosis (ICD10-I70.0).     10/03/2022 Imaging    IMPRESSION: 1. Mild but definite progression of pulmonary metastasis. 2. No extrathoracic metastatic disease identified. 3. Incidental findings, including: Left nephrolithiasis. Coronary artery atherosclerosis. Aortic Atherosclerosis (ICD10-I70.0). Possible constipation.   01/31/2023 -  Chemotherapy   Patient is on Treatment Plan : BREAST METASTATIC Fam-Trastuzumab Deruxtecan-nxki (Enhertu) (5.4) q21d        Discussed the use of AI scribe software for clinical note transcription with the patient, who gave verbal consent to proceed.  History of Present Illness   The patient, an 82 year [name] with metastatic breast cancer, presents for a follow-up visit. She denies any recent falls but reports intermittent leg weakness. Despite this, she continues to engage in usual activities with the assistance of her daughter. She occasionally takes naps and often feels the need to rest after appointments.  She reports a persistent cough that comes and goes, lasting for a couple of days at a time. She manages this with cough syrup. She denies any associated phlegm production or shortness of breath. She also denies any nausea or vomiting.  The patient received a cycle of a new chemotherapy regimen three weeks ago. She does not recall any significant side effects from this treatment. She expresses willingness to try a higher dose of the chemotherapy, understanding that it may cause increased fatigue.  All other systems were reviewed with the patient and are negative.  MEDICAL HISTORY:  Past Medical History:  Diagnosis Date   Diabetes mellitus without complication (HCC)     Hyperlipidemia    Hypertension     SURGICAL HISTORY: Past Surgical History:  Procedure Laterality Date   FOOT FRACTURE SURGERY Right    IR IMAGING GUIDED PORT INSERTION  09/22/2020   IR RADIOLOGIST EVAL & MGMT  09/25/2020    I have reviewed the social history and family history with the patient and they are unchanged from previous note.  ALLERGIES:  has no known allergies.  MEDICATIONS:  Current Outpatient Medications  Medication Sig Dispense Refill   aspirin EC 325 MG tablet Take 1 tablet (325 mg total) by mouth daily. 60 tablet 3   atorvastatin (LIPITOR) 20 MG tablet Take 1 tablet by mouth once daily 60 tablet 0   dexamethasone (DECADRON) 4 MG tablet Take 2 tablets (8 mg) by mouth daily for 3 days starting the day after chemotherapy. Take with food. 30 tablet 1   ferrous sulfate 325 (65 FE) MG EC tablet Take 1 tablet (325 mg total) by mouth daily with breakfast. 30 tablet 3   lidocaine-prilocaine (EMLA) cream Apply 1 Application topically as needed. 30 g 2   lidocaine-prilocaine (EMLA) cream Apply to affected area once 30 g 3   metFORMIN (GLUCOPHAGE) 500 MG tablet Take 1 tablet (500 mg total) by mouth 2 (two) times daily with a meal. 60 tablet 1   olmesartan (BENICAR) 40 MG tablet Take 1 tablet (40 mg total) by mouth daily. 30 tablet 0   ondansetron (ZOFRAN) 8 MG tablet Take 1 tablet (8 mg total) by mouth every 8 (eight) hours as needed for nausea or vomiting. Start on the third day after chemotherapy. 30 tablet 1   potassium chloride SA (KLOR-CON M) 20 MEQ tablet Take 1 tablet by mouth once daily 30 tablet 0   prochlorperazine (COMPAZINE) 10 MG tablet Take 1 tablet (10 mg total) by mouth every 6 (six) hours as needed for nausea or vomiting. 30 tablet 1   No current facility-administered medications for this visit.   Facility-Administered Medications Ordered in Other Visits  Medication Dose Route Frequency Provider Last Rate Last Admin   dextrose 5 % solution   Intravenous  Continuous Christy Mood, MD   Stopped at 02/21/23 1328   sodium chloride flush (NS) 0.9 % injection 10 mL  10 mL Intracatheter PRN Christy Mood, MD   10 mL at 02/21/23 1329    PHYSICAL EXAMINATION: ECOG PERFORMANCE STATUS: 2 - Symptomatic, <50% confined to bed  Vitals:   02/21/23 1012  BP: (!) 165/60  Pulse: 71  Resp: 17  Temp: (!) 97.3 F (36.3 C)  SpO2: 100%   Wt Readings from Last 3 Encounters:  02/21/23 156 lb 3.2 oz (70.9 kg)  02/07/23 158 lb 11.2 oz (72 kg)  01/31/23 160 lb (72.6 kg)     GENERAL:alert, no distress and comfortable SKIN: skin color, texture, turgor are normal, no rashes or significant lesions EYES: normal, Conjunctiva are pink and non-injected, sclera clear NECK: supple, thyroid normal size, non-tender, without nodularity LYMPH:  no palpable lymphadenopathy in the cervical, axillary  LUNGS: clear to auscultation and percussion with normal breathing effort except slightly decreased breath sound and rales on the right lung base HEART: regular rate & rhythm and no murmurs and no lower extremity edema ABDOMEN:abdomen soft, non-tender and normal bowel sounds Musculoskeletal:no cyanosis of digits and no clubbing  NEURO: alert & oriented x 3 with fluent speech, no focal motor/sensory deficits   LABORATORY DATA:  I have reviewed the data as listed    Latest Ref Rng & Units 02/21/2023    9:20 AM 02/07/2023    8:26 AM 01/31/2023    9:41 AM  CBC  WBC 4.0 - 10.5 K/uL 5.4  9.4  4.9   Hemoglobin 12.0 - 15.0 g/dL 7.9  8.0  8.0   Hematocrit 36.0 - 46.0 % 24.5  24.3  24.1   Platelets 150 - 400 K/uL 205  124  122         Latest Ref Rng & Units 02/21/2023    9:20 AM 02/07/2023    8:26 AM 01/31/2023    9:41 AM  CMP  Glucose 70 - 99 mg/dL 99  409  96   BUN 8 - 23 mg/dL 16  23  18    Creatinine 0.44 - 1.00 mg/dL 8.11  9.14  7.82   Sodium 135 - 145 mmol/L 143  142  144   Potassium 3.5 - 5.1 mmol/L 3.5  3.7  3.5   Chloride 98 - 111 mmol/L 114  112  114   CO2 22 - 32  mmol/L 25  25  26    Calcium 8.9 - 10.3 mg/dL 9.3  8.9  9.4   Total Protein 6.5 - 8.1 g/dL 5.7  5.7  6.1   Total Bilirubin <1.2 mg/dL 0.6  1.0  1.0   Alkaline Phos 38 - 126 U/L 99  84  78   AST 15 - 41 U/L 39  33  44   ALT 0 - 44 U/L 24  32  21       RADIOGRAPHIC STUDIES: I have personally reviewed the radiological images as listed and agreed with the findings in the report. DG Chest 2 View Result Date: 02/21/2023 CLINICAL DATA:  Cough for 2 weeks. EXAM: CHEST - 2 VIEW COMPARISON:  October 21, 2022. FINDINGS: The heart size and mediastinal contours are within normal limits. Right-sided Port-A-Cath is unchanged. No consolidative process seen in left lung. Mild right basilar atelectasis or infiltrate is noted with possible associated minimal pleural effusion. The visualized skeletal structures are unremarkable. IMPRESSION: Mild right basilar atelectasis or infiltrate is noted with possible associated minimal right pleural effusion. Electronically Signed   By: Lupita Raider M.D.   On: 02/21/2023 11:44      Orders Placed This Encounter  Procedures   DG Chest 2 View    Standing Status:   Future    Number of Occurrences:   1    Expected Date:   02/21/2023    Expiration Date:   02/21/2024    Reason for Exam (SYMPTOM  OR DIAGNOSIS REQUIRED):   COUGH FOR 2 WEEKS, rule out infection    Preferred imaging location?:   [facility]   Care order/instruction    Transfuse Parameters    Standing Status:   Future    Expiration Date:   02/21/2024   Type and screen         Standing Status:   Future    Number of Occurrences:   1    Expected Date:   02/21/2023    Expiration Date:   02/21/2024   Prepare RBC (crossmatch)    Standing Status:   Standing    Number of Occurrences:   1    # of Units:   1 unit    Transfusion Indications:  Other    Comments:   symptomatic anemia    Number of Units to Keep Ahead:   NO units ahead    Instructions::   Transfuse    If emergent release call blood  bank:   [facility] [phone]   All questions were answered. The patient knows to call the clinic with any problems, questions or concerns. No barriers to learning was detected. The total time spent in the appointment was 30 minutes.     Christy Mood, MD 02/21/2023

## 2023-02-21 NOTE — Patient Instructions (Signed)
 [facility] - A DEPT OF [facility]  Discharge Instructions: Thank you for choosing [facility] to provide your oncology and hematology care.   If you have a lab appointment with the Cancer Center, please go directly to the Cancer Center and check in at the registration area.   Wear comfortable clothing and clothing appropriate for easy access to any Portacath or PICC line.   We strive to give you quality time with your provider. You may need to reschedule your appointment if you arrive late (15 or more minutes).  Arriving late affects you and other patients whose appointments are after yours.  Also, if you miss three or more appointments without notifying the office, you may be dismissed from the clinic at the provider's discretion.      For prescription refill requests, have your pharmacy contact our office and allow 72 hours for refills to be completed.    Today you received the following chemotherapy and/or immunotherapy agents: Enhertu      To help prevent nausea and vomiting after your treatment, we encourage you to take your nausea medication as directed.  BELOW ARE SYMPTOMS THAT SHOULD BE REPORTED IMMEDIATELY: *FEVER GREATER THAN 100.4 F (38 C) OR HIGHER *CHILLS OR SWEATING *NAUSEA AND VOMITING THAT IS NOT CONTROLLED WITH YOUR NAUSEA MEDICATION *UNUSUAL SHORTNESS OF BREATH *UNUSUAL BRUISING OR BLEEDING *URINARY PROBLEMS (pain or burning when urinating, or frequent urination) *BOWEL PROBLEMS (unusual diarrhea, constipation, pain near the anus) TENDERNESS IN MOUTH AND THROAT WITH OR WITHOUT PRESENCE OF ULCERS (sore throat, sores in mouth, or a toothache) UNUSUAL RASH, SWELLING OR PAIN  UNUSUAL VAGINAL DISCHARGE OR ITCHING   Items with * indicate a potential emergency and should be followed up as soon as possible or go to the Emergency Department if any problems should occur.  Please show the CHEMOTHERAPY ALERT CARD or IMMUNOTHERAPY  ALERT CARD at check-in to the Emergency Department and triage nurse.  Should you have questions after your visit or need to cancel or reschedule your appointment, please contact [facility] - A DEPT OF [facility]  Dept: [phone]  and follow the prompts.  Office hours are 8:00 a.m. to 4:30 p.m. Monday - Friday. Please note that voicemails left after 4:00 p.m. may not be returned until the following business day.  We are closed weekends and major holidays. You have access to a nurse at all times for urgent questions. Please call the main number to the clinic Dept: [phone] and follow the prompts.   For any non-urgent questions, you may also contact your provider using MyChart. We now offer e-Visits for anyone 70 and older to request care online for non-urgent symptoms. For details visit [email or web address].   Also download the MyChart app! Go to the app store, search "MyChart", open the app, select [facility], and log in with your MyChart username and password.

## 2023-02-21 NOTE — Progress Notes (Signed)
Patient Hgb 7.9 today, per Dr Mosetta Putt, will proceed with treatment today. Patient scheduled to receive 1 unit pRBC tomorrow, 02/22/2023.

## 2023-02-22 ENCOUNTER — Other Ambulatory Visit: Payer: Self-pay

## 2023-02-22 ENCOUNTER — Inpatient Hospital Stay: Payer: Medicare HMO

## 2023-02-22 DIAGNOSIS — D649 Anemia, unspecified: Secondary | ICD-10-CM

## 2023-02-22 DIAGNOSIS — C50412 Malignant neoplasm of upper-outer quadrant of left female breast: Secondary | ICD-10-CM | POA: Diagnosis not present

## 2023-02-22 DIAGNOSIS — R051 Acute cough: Secondary | ICD-10-CM

## 2023-02-22 DIAGNOSIS — Z171 Estrogen receptor negative status [ER-]: Secondary | ICD-10-CM

## 2023-02-22 MED ORDER — SODIUM CHLORIDE 0.9% IV SOLUTION: 250.0000 mL | INTRAVENOUS | Status: DC

## 2023-02-22 NOTE — Patient Instructions (Signed)

## 2023-02-23 ENCOUNTER — Encounter: Payer: Self-pay | Admitting: Hematology

## 2023-02-23 LAB — TYPE AND SCREEN
ABO/RH(D): O POS
Antibody Screen: NEGATIVE
Unit division: 0

## 2023-02-23 LAB — BPAM RBC
Blood Product Expiration Date: 202501112359
ISSUE DATE / TIME: 202412181220
Unit Type and Rh: 202501112359
Unit Type and Rh: 5100

## 2023-03-13 MED FILL — Fosaprepitant Dimeglumine For IV Infusion 150 MG (Base Eq): INTRAVENOUS | Qty: 5 | Status: AC

## 2023-03-13 NOTE — Assessment & Plan Note (Deleted)
 [id number] stage IV, grade 2-3, ER-/PR- HER2+, with nodal and pulmonary metastases -She initially self palpated a left breast mass around 06/2020. Work-up showed 2 masses in the left breast UOQ and 3 abnormal lymph nodes. 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 -PET scan 08/28/20 showed: hypermetabolism to left breast and axilla, retropectoral and supraclavicular adenopathy, and several 3-6 mm pulmonary nodules. -She started first-line Kadcyla  on 09/11/20. She tolerates well with some fatigue, dose decreased with C4.  -She has had good clinical response, breast mass was no longer palpable on physical exam 06/09/21.  -last PET scan in 01/2022 showed two small lung nodule (5-61mm), slightly bigger since last scan, no other hypermetabolic lesions.   -Her restaging CT scan from June 17, 2022 showed multiple enlarging lung nodules, largest measuring 1 cm, previously 0.6 cm.  This is highly concerning for pulmonary metastasis with slight disease progression. No other new lesions -she tried tucatinib  150mg  twice daily but did not tolerate with excessive fatigue and drowsiness -she restarted Tucatinib  at 50mg  bid last week , she is tolerating well -I increased Tucatinib  to 100mg  bid on 5/29  -She did not tolerate well, with more fatigue, and unstable gait.  She also developed worsening renal function, I stopped Tucatinib  on 08/15/2022 -She is clinically stable, renal function has returned to her baseline, but no improvement in her fatigue and gait.  Will continue holding tucatinib  now and continue Kadcyla   -repeated CT from 10/03/2022 showed mild progression in lung mets (comparing to three previous scans), but overall low tumor burden and no additional new mets. So we decided to continue Kadcyla  and monitor her disease closely  -She again had a multiple falls lately, and more fatigued.  We discussed a chemo break.  Chemo held after treatment on 10/05/2022 -due to disease progression, treatment changed  to Enhertu  on  01/31/23

## 2023-03-14 ENCOUNTER — Inpatient Hospital Stay: Payer: Medicare HMO | Admitting: Hematology

## 2023-03-14 ENCOUNTER — Ambulatory Visit: Payer: Medicare HMO | Admitting: Hematology

## 2023-03-14 ENCOUNTER — Inpatient Hospital Stay: Payer: Medicare HMO

## 2023-03-14 ENCOUNTER — Ambulatory Visit: Payer: Medicare HMO

## 2023-03-14 ENCOUNTER — Other Ambulatory Visit: Payer: Medicare HMO

## 2023-03-14 DIAGNOSIS — C50412 Malignant neoplasm of upper-outer quadrant of left female breast: Secondary | ICD-10-CM

## 2023-03-16 ENCOUNTER — Encounter: Payer: Self-pay | Admitting: Hematology

## 2023-03-16 ENCOUNTER — Telehealth: Payer: Self-pay | Admitting: Hematology

## 2023-03-16 NOTE — Telephone Encounter (Signed)
 Called patient twice and also left a voicemail regarding scheduled appointment times/dates; left callback number for scheduling if needed

## 2023-03-19 NOTE — Progress Notes (Deleted)
 Patient Care Team: Shelda Atlas, MD as PCP - General (Internal Medicine) Lanny Callander, MD as Consulting Physician (Medical Oncology)  Clinic Day:  03/19/2023  Referring physician: Lanny Callander, MD  ASSESSMENT & PLAN:   Assessment & Plan: Malignant neoplasm of upper-outer quadrant of female breast [facility]) [id number] stage IV, grade 2-3, ER-/PR- HER2+, with nodal and pulmonary metastases -She initially self palpated a left breast mass around 06/2020. Work-up showed 2 masses in the left breast UOQ and 3 abnormal lymph nodes. 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 -PET scan 08/28/20 showed: hypermetabolism to left breast and axilla, retropectoral and supraclavicular adenopathy, and several 3-6 mm pulmonary nodules. -She started first-line Kadcyla  on 09/11/20. She tolerates well with some fatigue, dose decreased with C4.  -She has had good clinical response, breast mass was no longer palpable on physical exam 06/09/21.  -last PET scan in 01/2022 showed two small lung nodule (5-53mm), slightly bigger since last scan, no other hypermetabolic lesions.   -Her restaging CT scan from June 17, 2022 showed multiple enlarging lung nodules, largest measuring 1 cm, previously 0.6 cm.  This is highly concerning for pulmonary metastasis with slight disease progression. No other new lesions -she tried tucatinib  150mg  twice daily but did not tolerate with excessive fatigue and drowsiness -she restarted Tucatinib  at 50mg  bid last week , she is tolerating well -I increased Tucatinib  to 100mg  bid on 5/29  -She did not tolerate well, with more fatigue, and unstable gait.  She also developed worsening renal function, I stopped Tucatinib  on 08/15/2022 -She is clinically stable, renal function has returned to her baseline, but no improvement in her fatigue and gait.  Will continue holding tucatinib  now and continue Kadcyla   -repeated CT from 10/03/2022 showed mild progression in lung mets (comparing to three  previous scans), but overall low tumor burden and no additional new mets. So we decided to continue Kadcyla  and monitor her disease closely  -She again had a multiple falls lately, and more fatigued.  We discussed a chemo break.  Chemo held after treatment on 10/05/2022 -due to disease progression, treatment changed to Enhertu  on  01/31/23    The patient understands the plans discussed today and is in agreement with them.  She knows to contact our office if she develops concerns prior to her next appointment.  I provided *** minutes of face-to-face time during this encounter and > 50% was spent counseling as documented under my assessment and plan.    Powell FORBES Lessen, NP  [facility] - A DEPT OF [facility] [address] [place] [address] Dept: [phone] Dept Fax: [phone]   No orders of the defined types were placed in this encounter.     CHIEF COMPLAINT:  CC: Left breast cancer  Current Treatment: Second line Enhertu    INTERVAL HISTORY:  Kaytie is here today for repeat clinical assessment.  She was last seen by Dr. Lanny on 02/21/2023.  Hemoglobin at that time was 7.9 and she required blood transfusion.  Reports fatigue and bilateral leg weakness without falls.  Today is to be cycle 3 day 1 of Enhertu .  She denies fevers or chills. She denies pain. Her appetite is good. Her weight {Weight change:10426}.  I have reviewed the past medical history, past surgical history, social history and family history with the patient and they are unchanged from previous note.  ALLERGIES:  has no known allergies.  MEDICATIONS:  Current Outpatient Medications  Medication Sig Dispense Refill   aspirin  EC 325 MG tablet Take 1 tablet (325 mg total) by mouth daily. 60 tablet 3   atorvastatin  (LIPITOR) 20 MG tablet Take 1 tablet by mouth once daily 60 tablet 0   dexamethasone  (DECADRON ) 4 MG tablet Take 2 tablets (8 mg)  by mouth daily for 3 days starting the day after chemotherapy. Take with food. 30 tablet 1   ferrous sulfate  325 (65 FE) MG EC tablet Take 1 tablet (325 mg total) by mouth daily with breakfast. 30 tablet 3   lidocaine -prilocaine  (EMLA ) cream Apply 1 Application topically as needed. 30 g 2   lidocaine -prilocaine  (EMLA ) cream Apply to affected area once 30 g 3   metFORMIN  (GLUCOPHAGE ) 500 MG tablet Take 1 tablet (500 mg total) by mouth 2 (two) times daily with a meal. 60 tablet 1   olmesartan  (BENICAR ) 40 MG tablet Take 1 tablet (40 mg total) by mouth daily. 30 tablet 0   ondansetron  (ZOFRAN ) 8 MG tablet Take 1 tablet (8 mg total) by mouth every 8 (eight) hours as needed for nausea or vomiting. Start on the third day after chemotherapy. 30 tablet 1   potassium chloride  SA (KLOR-CON  M) 20 MEQ tablet Take 1 tablet by mouth once daily 30 tablet 0   prochlorperazine  (COMPAZINE ) 10 MG tablet Take 1 tablet (10 mg total) by mouth every 6 (six) hours as needed for nausea or vomiting. 30 tablet 1   No current facility-administered medications for this visit.    HISTORY OF PRESENT ILLNESS:   Oncology History Overview Note  Cancer Staging Malignant neoplasm of upper-outer quadrant of female breast [facility]) Staging form: Breast, AJCC 8th Edition - Clinical stage from 07/31/2020: Stage IV (cT2, cN3, cM1, G2, ER-, PR-, HER2+) - Signed by Burton, Lacie K, NP on 08/31/2020 Stage prefix: Initial diagnosis Nuclear grade: G2 Histologic grading system: 3 grade system    Malignant neoplasm of upper-outer quadrant of female breast (HCC)  07/24/2020 Breast US    On physical exam, a very firm mass is identified at the 1:30 position of the LEFT breast 5 cm from the nipple.   IMPRESSION: 1. Highly suspicious 4.3 cm mass at the 1:30 position of the LEFT breast and highly suspicious 2.7 cm mass at the 2 o'clock position of the LEFT breast, with the 2 masses encompassing an area measuring at least 6.1 cm. 2. 3 abnormal  LEFT axillary lymph nodes with cortical thickening, tissue sampling of 1 of these lymph nodes recommended. 3. Anterior LEFT breast skin thickening nonspecific but may reflect dermal involvement. 4. No mammographic evidence of RIGHT breast malignancy.   07/31/2020 Initial Biopsy   Diagnosis 1. Breast, left, needle core biopsy, 1 :30 pm, 5cmfn - INVASIVE DUCTAL CARCINOMA, SEE COMMENT. 2. Breast, left, needle core biopsy, 2 o'clock, 8cmfn - INVASIVE DUCTAL CARCINOMA, SEE COMMENT. - DUCTAL CARCINOMA IN SITU. 3. Lymph node, needle/core biopsy, left axillary - METASTATIC CARCINOMA IN A LYMPH NODE. Microscopic Comment 1. and 3. The carcinoma in both parts has a similar morphology and appears grade 2-3. The DCIS in part 2 is high-grade with necrosis. The carcinoma measures 15 mm (part 1) and 14 mm (part 2) in greatest linear extent  2. PROGNOSTIC INDICATORS Results: IMMUNOHISTOCHEMICAL AND MORPHOMETRIC ANALYSIS PERFORMED MANUALLY The tumor cells are POSITIVE for Her2 (3+). Estrogen Receptor: 0%, NEGATIVE Progesterone Receptor: 0%, NEGATIVE Proliferation Marker Ki67: 20%    07/31/2020 Cancer Staging   Staging form: Breast, AJCC 8th Edition - Clinical  stage from 07/31/2020: Stage IV (cT2, cN3, cM1, G2, ER-, PR-, HER2+) - Signed by Burton, Lacie K, NP on 08/31/2020 Stage prefix: Initial diagnosis Nuclear grade: G2 Histologic grading system: 3 grade system   08/13/2020 Initial Diagnosis   Malignant neoplasm of upper-outer quadrant of female breast (HCC)   08/28/2020 PET scan   IMPRESSION: Signs of LEFT breast cancer with LEFT axillary and retropectoral adenopathy as well as LEFT supraclavicular adenopathy.   Signs of pulmonary metastatic disease.   Mild asymmetric uptake in the LEFT as compared to the RIGHT adrenal gland. Equivocal but suspicious, attention on follow-up.   Aortic atherosclerosis.   Cystic changes and potential bronchiectasis in the RIGHT lung base likely post infectious  or inflammatory. Correlate with any respiratory symptoms. Comparison with prior imaging may be helpful with attention on follow-up.   08/28/2020 Imaging   BRAIN MRI IMPRESSION: 1. Subacute lacunar infarct of the left basal ganglia with no malignant hemorrhagic transformation or mass effect. Underlying chronic bilateral basal ganglia ischemia. 2. No metastatic disease or other acute intracranial abnormality identified.   08/28/2020 Imaging   Breast MRI   IMPRESSION: 1. 8.1 x 8.1 x 5.1 cm area of biopsy-proven malignancy in the central, upper outer and lower outer quadrants of the left breast. 2. Diffuse anterior left breast skin thickening without abnormal enhancement. This may represent thickening due to lymphedema associated with lymphatic obstruction. 3. Metastatic level 1 and level 2 left axillary adenopathy. 4. No evidence of malignancy on the right.   09/11/2020 - 11/03/2021 Chemotherapy   Patient is on Treatment Plan : BREAST ADO-Trastuzumab Emtansine  (Kadcyla ) q21d     09/11/2020 - 01/04/2023 Chemotherapy   Patient is on Treatment Plan : BREAST ADO-Trastuzumab Emtansine  (Kadcyla ) q21d     11/30/2020 Imaging   CT Chest w/o contrast  IMPRESSION: 1. Interval response to therapy. The left breast mass has mildly decreased in size in the interval. 2. Decrease in size of left axillary and left retropectoral lymph nodes. 3. Small right lung nodules stable to improved. 4. Coronary artery calcifications noted. 5. Aortic Atherosclerosis (ICD10-I70.0).   03/04/2021 PET scan   IMPRESSION: 1. Overall marked improvement compared to the prior PET-CT, with resolution of hypermetabolic activity and pathologic enlargement of the previously involved lymph nodes; lack of hypermetabolic activity and nonvisualization of prior right-sided lung nodules, and substantial reduction in size and activity in the left breast primary. 2. New ground-glass opacity medially in the right lower lobe  with low-grade metabolic activity, highly likely to be inflammatory (alveolitis) given the morphology and appearance. 3. Other imaging findings of potential clinical significance: Aortic Atherosclerosis (ICD10-I70.0). Coronary atherosclerosis. Mitral valve calcifications. Punctate nonobstructive left renal calculi. Lower lumbar impingement due to chronic spondylosis and degenerative disc disease. Remote left basal ganglia infarct.   06/07/2021 Imaging   EXAM: CT CHEST, ABDOMEN AND PELVIS WITHOUT CONTRAST  IMPRESSION: 1. Scattered tiny bilateral pulmonary nodules are similar to the prior exams, favored to be benign. 2. Otherwise, no evidence of metastatic disease within the chest, abdomen, or pelvis. 3. Right lower lobe ground-glass nodule has resolved since the prior PET 4.  Possible constipation. 5. Coronary artery atherosclerosis. Aortic Atherosclerosis (ICD10-I70.0). Emphysema (ICD10-J43.9). 6. Left nephrolithiasis.   06/15/2022 Imaging    IMPRESSION: 1. Mild increase in size of bilateral pulmonary nodules compatible with progression of metastatic disease. 2. No evidence for metastatic disease within the abdomen or pelvis. 3. Large volume of desiccated stool identified within the rectum. Correlate for any clinical signs or symptoms of constipation.  4. Nonobstructing left renal calculus. 5. Aortic Atherosclerosis (ICD10-I70.0).     10/03/2022 Imaging    IMPRESSION: 1. Mild but definite progression of pulmonary metastasis. 2. No extrathoracic metastatic disease identified. 3. Incidental findings, including: Left nephrolithiasis. Coronary artery atherosclerosis. Aortic Atherosclerosis (ICD10-I70.0). Possible constipation.   01/31/2023 -  Chemotherapy   Patient is on Treatment Plan : BREAST METASTATIC Fam-Trastuzumab Deruxtecan-nxki  (Enhertu ) (5.4) q21d         REVIEW OF SYSTEMS:   Constitutional: Denies fevers, chills or abnormal weight loss Eyes: Denies blurriness of  vision Ears, nose, mouth, throat, and face: Denies mucositis or sore throat Respiratory: Denies cough, dyspnea or wheezes Cardiovascular: Denies palpitation, chest discomfort or lower extremity swelling Gastrointestinal:  Denies nausea, heartburn or change in bowel habits Skin: Denies abnormal skin rashes Lymphatics: Denies new lymphadenopathy or easy bruising Neurological:Denies numbness, tingling or new weaknesses Behavioral/Psych: Mood is stable, no new changes  All other systems were reviewed with the patient and are negative.   VITALS:  There were no vitals taken for this visit.  Wt Readings from Last 3 Encounters:  02/21/23 156 lb 3.2 oz (70.9 kg)  02/07/23 158 lb 11.2 oz (72 kg)  01/31/23 160 lb (72.6 kg)    There is no height or weight on file to calculate BMI.  Performance status (ECOG): {CHL ONC [id number]  PHYSICAL EXAM:   GENERAL:alert, no distress and comfortable SKIN: skin color, texture, turgor are normal, no rashes or significant lesions EYES: normal, Conjunctiva are pink and non-injected, sclera clear OROPHARYNX:no exudate, no erythema and lips, buccal mucosa, and tongue normal  NECK: supple, thyroid normal size, non-tender, without nodularity LYMPH:  no palpable lymphadenopathy in the cervical, axillary or inguinal LUNGS: clear to auscultation and percussion with normal breathing effort HEART: regular rate & rhythm and no murmurs and no lower extremity edema ABDOMEN:abdomen soft, non-tender and normal bowel sounds Musculoskeletal:no cyanosis of digits and no clubbing  NEURO: alert & oriented x 3 with fluent speech, no focal motor/sensory deficits  LABORATORY DATA:  I have reviewed the data as listed    Component Value Date/Time   NA 143 02/21/2023 0920   K 3.5 02/21/2023 0920   CL 114 (H) 02/21/2023 0920   CO2 25 02/21/2023 0920   GLUCOSE 99 02/21/2023 0920   BUN 16 02/21/2023 0920   CREATININE 1.24 (H) 02/21/2023 0920   CALCIUM  9.3 02/21/2023  0920   PROT 5.7 (L) 02/21/2023 0920   ALBUMIN  3.0 (L) 02/21/2023 0920   AST 39 02/21/2023 0920   ALT 24 02/21/2023 0920   ALKPHOS 99 02/21/2023 0920   BILITOT 0.6 02/21/2023 0920   GFRNONAA 43 (L) 02/21/2023 0920   GFRAA 39 (L) 06/25/2019 1324    No results found for: SPEP, UPEP  Lab Results  Component Value Date   WBC 5.4 02/21/2023   NEUTROABS 2.1 02/21/2023   HGB 7.9 (L) 02/21/2023   HCT 24.5 (L) 02/21/2023   MCV 80.9 02/21/2023   PLT 205 02/21/2023      Chemistry      Component Value Date/Time   NA 143 02/21/2023 0920   K 3.5 02/21/2023 0920   CL 114 (H) 02/21/2023 0920   CO2 25 02/21/2023 0920   BUN 16 02/21/2023 0920   CREATININE 1.24 (H) 02/21/2023 0920      Component Value Date/Time   CALCIUM  9.3 02/21/2023 0920   ALKPHOS 99 02/21/2023 0920   AST 39 02/21/2023 0920   ALT 24 02/21/2023 0920   BILITOT 0.6 02/21/2023  0920       RADIOGRAPHIC STUDIES: I have personally reviewed the radiological images as listed and agreed with the findings in the report. DG Chest 2 View Result Date: 02/21/2023 CLINICAL DATA:  Cough for 2 weeks. EXAM: CHEST - 2 VIEW COMPARISON:  October 21, 2022. FINDINGS: The heart size and mediastinal contours are within normal limits. Right-sided Port-A-Cath is unchanged. No consolidative process seen in left lung. Mild right basilar atelectasis or infiltrate is noted with possible associated minimal pleural effusion. The visualized skeletal structures are unremarkable. IMPRESSION: Mild right basilar atelectasis or infiltrate is noted with possible associated minimal right pleural effusion. Electronically Signed   By: Lynwood Landy Raddle M.D.   On: 02/21/2023 11:44

## 2023-03-19 NOTE — Assessment & Plan Note (Deleted)
 [id number] stage IV, grade 2-3, ER-/PR- HER2+, with nodal and pulmonary metastases -She initially self palpated a left breast mass around 06/2020. Work-up showed 2 masses in the left breast UOQ and 3 abnormal lymph nodes. 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 -PET scan 08/28/20 showed: hypermetabolism to left breast and axilla, retropectoral and supraclavicular adenopathy, and several 3-6 mm pulmonary nodules. -She started first-line Kadcyla  on 09/11/20. She tolerates well with some fatigue, dose decreased with C4.  -She has had good clinical response, breast mass was no longer palpable on physical exam 06/09/21.  -last PET scan in 01/2022 showed two small lung nodule (5-34mm), slightly bigger since last scan, no other hypermetabolic lesions.   -Her restaging CT scan from June 17, 2022 showed multiple enlarging lung nodules, largest measuring 1 cm, previously 0.6 cm.  This is highly concerning for pulmonary metastasis with slight disease progression. No other new lesions -she tried tucatinib  150mg  twice daily but did not tolerate with excessive fatigue and drowsiness -she restarted Tucatinib  at 50mg  bid last week , she is tolerating well -I increased Tucatinib  to 100mg  bid on 5/29  -She did not tolerate well, with more fatigue, and unstable gait.  She also developed worsening renal function, I stopped Tucatinib  on 08/15/2022 -She is clinically stable, renal function has returned to her baseline, but no improvement in her fatigue and gait.  Will continue holding tucatinib  now and continue Kadcyla   -repeated CT from 10/03/2022 showed mild progression in lung mets (comparing to three previous scans), but overall low tumor burden and no additional new mets. So we decided to continue Kadcyla  and monitor her disease closely  -She again had a multiple falls lately, and more fatigued.  We discussed a chemo break.  Chemo held after treatment on 10/05/2022 -due to disease progression, treatment changed  to Enhertu  on  01/31/23  -02/21/2023 -treatment held due to hemoglobin of 7.9.  1 unit of blood given 02/22/2023. -03/20/2023 -today she can be cycle 3 day 1 of Enhertu .

## 2023-03-20 ENCOUNTER — Inpatient Hospital Stay: Payer: Medicare HMO | Attending: Hematology | Admitting: Nurse Practitioner

## 2023-03-20 ENCOUNTER — Inpatient Hospital Stay: Payer: Medicare HMO

## 2023-03-20 ENCOUNTER — Ambulatory Visit: Payer: Medicare HMO | Admitting: Nurse Practitioner

## 2023-03-20 ENCOUNTER — Telehealth: Payer: Self-pay

## 2023-03-20 DIAGNOSIS — Z5112 Encounter for antineoplastic immunotherapy: Secondary | ICD-10-CM | POA: Insufficient documentation

## 2023-03-20 DIAGNOSIS — C7800 Secondary malignant neoplasm of unspecified lung: Secondary | ICD-10-CM | POA: Insufficient documentation

## 2023-03-20 DIAGNOSIS — I2510 Atherosclerotic heart disease of native coronary artery without angina pectoris: Secondary | ICD-10-CM | POA: Insufficient documentation

## 2023-03-20 DIAGNOSIS — J439 Emphysema, unspecified: Secondary | ICD-10-CM | POA: Insufficient documentation

## 2023-03-20 DIAGNOSIS — N200 Calculus of kidney: Secondary | ICD-10-CM | POA: Insufficient documentation

## 2023-03-20 DIAGNOSIS — Z171 Estrogen receptor negative status [ER-]: Secondary | ICD-10-CM | POA: Insufficient documentation

## 2023-03-20 DIAGNOSIS — Z8673 Personal history of transient ischemic attack (TIA), and cerebral infarction without residual deficits: Secondary | ICD-10-CM | POA: Insufficient documentation

## 2023-03-20 DIAGNOSIS — I700 Atherosclerosis of aorta: Secondary | ICD-10-CM | POA: Insufficient documentation

## 2023-03-20 DIAGNOSIS — M4726 Other spondylosis with radiculopathy, lumbar region: Secondary | ICD-10-CM | POA: Insufficient documentation

## 2023-03-20 DIAGNOSIS — Z1722 Progesterone receptor negative status: Secondary | ICD-10-CM | POA: Insufficient documentation

## 2023-03-20 DIAGNOSIS — Z79899 Other long term (current) drug therapy: Secondary | ICD-10-CM | POA: Insufficient documentation

## 2023-03-20 DIAGNOSIS — M5116 Intervertebral disc disorders with radiculopathy, lumbar region: Secondary | ICD-10-CM | POA: Insufficient documentation

## 2023-03-20 DIAGNOSIS — I6381 Other cerebral infarction due to occlusion or stenosis of small artery: Secondary | ICD-10-CM | POA: Insufficient documentation

## 2023-03-20 DIAGNOSIS — Z9181 History of falling: Secondary | ICD-10-CM | POA: Insufficient documentation

## 2023-03-20 DIAGNOSIS — C50412 Malignant neoplasm of upper-outer quadrant of left female breast: Secondary | ICD-10-CM | POA: Insufficient documentation

## 2023-03-20 DIAGNOSIS — R296 Repeated falls: Secondary | ICD-10-CM | POA: Insufficient documentation

## 2023-03-20 MED FILL — Fosaprepitant Dimeglumine For IV Infusion 150 MG (Base Eq): INTRAVENOUS | Qty: 5 | Status: AC

## 2023-03-20 NOTE — Assessment & Plan Note (Addendum)
 [id number] stage IV, grade 2-3, ER-/PR- HER2+, with nodal and pulmonary metastases -She initially self palpated a left breast mass around 06/2020. Work-up showed 2 masses in the left breast UOQ and 3 abnormal lymph nodes. 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 -PET scan 08/28/20 showed: hypermetabolism to left breast and axilla, retropectoral and supraclavicular adenopathy, and several 3-6 mm pulmonary nodules. -She started first-line Kadcyla  on 09/11/20. She tolerates well with some fatigue, dose decreased with C4.  -She has had good clinical response, breast mass was no longer palpable on physical exam 06/09/21.  -last PET scan in 01/2022 showed two small lung nodule (5-51mm), slightly bigger since last scan, no other hypermetabolic lesions.   -Her restaging CT scan from June 17, 2022 showed multiple enlarging lung nodules, largest measuring 1 cm, previously 0.6 cm.  This is highly concerning for pulmonary metastasis with slight disease progression. No other new lesions -she tried tucatinib  150mg  twice daily but did not tolerate with excessive fatigue and drowsiness -she restarted Tucatinib  at 50mg  bid last week , she is tolerating well -I increased Tucatinib  to 100mg  bid on 5/29  -She did not tolerate well, with more fatigue, and unstable gait.  She also developed worsening renal function, I stopped Tucatinib  on 08/15/2022 -She is clinically stable, renal function has returned to her baseline, but no improvement in her fatigue and gait.  Will continue holding tucatinib  now and continue Kadcyla   -repeated CT from 10/03/2022 showed mild progression in lung mets (comparing to three previous scans), but overall low tumor burden and no additional new mets. So we decided to continue Kadcyla  and monitor her disease closely  -She again had a multiple falls lately, and more fatigued.  We discussed a chemo break.  Chemo held after treatment on 10/05/2022 -due to disease progression, treatment changed  to Enhertu  on  01/31/23  -02/21/2023 -treatment held due to hemoglobin of 7.9.  1 unit of blood given 02/22/2023. -03/21/2023 -today she presents for cycle 3 day 1 of Enhertu .

## 2023-03-20 NOTE — Telephone Encounter (Signed)
 Called patient due to not showing up for her 745 lab appointment. No answer to call so I left a message on the VM to call us back if she was going to come in. She has treatment tomorrow. Will no show patient for today's appointments.

## 2023-03-20 NOTE — Progress Notes (Signed)
 Patient Care Team: Shelda Atlas, MD as PCP - General (Internal Medicine) Lanny Callander, MD as Consulting Physician (Medical Oncology)  Clinic Day:  03/21/2023  Referring physician: Shelda Atlas, MD  ASSESSMENT & PLAN:   Assessment & Plan: Malignant neoplasm of upper-outer quadrant of female breast [facility]) cT2N3M1 stage IV, grade 2-3, ER-/PR- HER2+, with nodal and pulmonary metastases -She initially self palpated a left breast mass around 06/2020. Work-up showed 2 masses in the left breast UOQ and 3 abnormal lymph nodes. 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 -PET scan 08/28/20 showed: hypermetabolism to left breast and axilla, retropectoral and supraclavicular adenopathy, and several 3-6 mm pulmonary nodules. -She started first-line Kadcyla  on 09/11/20. She tolerates well with some fatigue, dose decreased with C4.  -She has had good clinical response, breast mass was no longer palpable on physical exam 06/09/21.  -last PET scan in 01/2022 showed two small lung nodule (5-66mm), slightly bigger since last scan, no other hypermetabolic lesions.   -Her restaging CT scan from June 17, 2022 showed multiple enlarging lung nodules, largest measuring 1 cm, previously 0.6 cm.  This is highly concerning for pulmonary metastasis with slight disease progression. No other new lesions -she tried tucatinib  150mg  twice daily but did not tolerate with excessive fatigue and drowsiness -she restarted Tucatinib  at 50mg  bid last week , she is tolerating well -I increased Tucatinib  to 100mg  bid on 5/29  -She did not tolerate well, with more fatigue, and unstable gait.  She also developed worsening renal function, I stopped Tucatinib  on 08/15/2022 -She is clinically stable, renal function has returned to her baseline, but no improvement in her fatigue and gait.  Will continue holding tucatinib  now and continue Kadcyla   -repeated CT from 10/03/2022 showed mild progression in lung mets (comparing to three  previous scans), but overall low tumor burden and no additional new mets. So we decided to continue Kadcyla  and monitor her disease closely  -She again had a multiple falls lately, and more fatigued.  We discussed a chemo break.  Chemo held after treatment on 10/05/2022 -due to disease progression, treatment changed to Enhertu  on  01/31/23  -02/21/2023 -treatment held due to hemoglobin of 7.9.  1 unit of blood given 02/22/2023. -03/21/2023 -today she presents for cycle 3 day 1 of Enhertu .   Plan: Patient seen in infusion today. Labs reviewed  -CBC showing WBC 5.0; Hgb 9.8; Hct 28.9; Plt 112; Anc 3.7 -CMP - K 3.7; glucose 115; BUN 18; Creatinine 1.3; eGFR 38; Ca 9.5; LFTs normal.   -Ferritin pending Hemoglobin has improved since last check.  Her labs and clinical condition are adequate for treatment today.  Okay to proceed with cycle 3 day 1 Enhertu . Labs/flush, follow-up, and treatment in 3 weeks.  Will adjust current schedule to reflect changes from this cycle being delayed.  The patient understands the plans discussed today and is in agreement with them.  She knows to contact our office if she develops concerns prior to her next appointment.  I provided 20 minutes of face-to-face time during this encounter and > 50% was spent counseling as documented under my assessment and plan.    Powell FORBES Lessen, NP  [facility] - A DEPT OF [facility] [address] [place] [address] Dept: [phone] Dept Fax: [phone]   No orders of the defined types were placed in this encounter.     CHIEF COMPLAINT:  CC: Left  breast cancer  Current Treatment: Second line Enhertu    INTERVAL HISTORY:  Christy [name] is here today for repeat clinical assessment.  She was last seen by Dr. Lanny on 02/21/2023.  Hemoglobin at that time was 7.9 and she required blood transfusion.  Reports fatigue and bilateral leg weakness.  She did have a  fall a few weeks ago at the grocery store.  Has 2 teeth which were knocked loose.  Needs to find a dentist.  Today is to be cycle 3 day 1 of Enhertu .  Hemoglobin has recovered well.  Today, it is 9.8.  She denies chest pain, chest pressure, or shortness of breath. She denies headaches or visual disturbances. She denies abdominal pain, nausea, vomiting, or changes in bowel habits.  She reports some urinary frequency, especially at night.  She states she does drink water, and sometimes soda, before bed and during the night.  She denies dysuria or hematuria.  She denies fevers or chills. She denies pain. Her appetite is good. Her weight has been stable.  I have reviewed the past medical history, past surgical history, social history and family history with the patient and they are unchanged from previous note.  ALLERGIES:  has no known allergies.  MEDICATIONS:  Current Outpatient Medications  Medication Sig Dispense Refill   aspirin  EC 325 MG tablet Take 1 tablet (325 mg total) by mouth daily. 60 tablet 3   atorvastatin  (LIPITOR) 20 MG tablet Take 1 tablet by mouth once daily 60 tablet 0   dexamethasone  (DECADRON ) 4 MG tablet Take 2 tablets (8 mg) by mouth daily for 3 days starting the day after chemotherapy. Take with food. 30 tablet 1   ferrous sulfate  325 (65 FE) MG EC tablet Take 1 tablet (325 mg total) by mouth daily with breakfast. 30 tablet 3   lidocaine -prilocaine  (EMLA ) cream Apply 1 Application topically as needed. 30 g 2   lidocaine -prilocaine  (EMLA ) cream Apply to affected area once 30 g 3   metFORMIN  (GLUCOPHAGE ) 500 MG tablet Take 1 tablet (500 mg total) by mouth 2 (two) times daily with a meal. 60 tablet 1   olmesartan  (BENICAR ) 40 MG tablet Take 1 tablet (40 mg total) by mouth daily. 30 tablet 0   ondansetron  (ZOFRAN ) 8 MG tablet Take 1 tablet (8 mg total) by mouth every 8 (eight) hours as needed for nausea or vomiting. Start on the third day after chemotherapy. 30 tablet 1   potassium  chloride SA (KLOR-CON  M) 20 MEQ tablet Take 1 tablet by mouth once daily 30 tablet 0   prochlorperazine  (COMPAZINE ) 10 MG tablet Take 1 tablet (10 mg total) by mouth every 6 (six) hours as needed for nausea or vomiting. 30 tablet 1   No current facility-administered medications for this visit.   Facility-Administered Medications Ordered in Other Visits  Medication Dose Route Frequency Provider Last Rate Last Admin   dextrose  5 % solution   Intravenous Continuous Lanny Callander, MD   Stopped at 03/21/23 1649   sodium chloride  flush (NS) 0.9 % injection 10 mL  10 mL Intracatheter PRN Lanny Callander, MD   10 mL at 03/21/23 1647    HISTORY OF PRESENT ILLNESS:   Oncology History Overview Note  Cancer Staging Malignant neoplasm of upper-outer quadrant of female breast [facility]) Staging form: Breast, AJCC 8th Edition - Clinical stage from 07/31/2020: Stage IV (cT2, cN3, cM1, G2, ER-, PR-, HER2+) - Signed by Burton, Lacie K, NP on 08/31/2020 Stage prefix: Initial diagnosis Nuclear grade: G2 Histologic grading  system: 3 grade system    Malignant neoplasm of upper-outer quadrant of female breast (HCC)  07/24/2020 Breast US    On physical exam, a very firm mass is identified at the 1:30 position of the LEFT breast 5 cm from the nipple.   IMPRESSION: 1. Highly suspicious 4.3 cm mass at the 1:30 position of the LEFT breast and highly suspicious 2.7 cm mass at the 2 o'clock position of the LEFT breast, with the 2 masses encompassing an area measuring at least 6.1 cm. 2. 3 abnormal LEFT axillary lymph nodes with cortical thickening, tissue sampling of 1 of these lymph nodes recommended. 3. Anterior LEFT breast skin thickening nonspecific but may reflect dermal involvement. 4. No mammographic evidence of RIGHT breast malignancy.   07/31/2020 Initial Biopsy   Diagnosis 1. Breast, left, needle core biopsy, 1 :30 pm, 5cmfn - INVASIVE DUCTAL CARCINOMA, SEE COMMENT. 2. Breast, left, needle core biopsy, 2 o'clock,  8cmfn - INVASIVE DUCTAL CARCINOMA, SEE COMMENT. - DUCTAL CARCINOMA IN SITU. 3. Lymph node, needle/core biopsy, left axillary - METASTATIC CARCINOMA IN A LYMPH NODE. Microscopic Comment 1. and 3. The carcinoma in both parts has a similar morphology and appears grade 2-3. The DCIS in part 2 is high-grade with necrosis. The carcinoma measures 15 mm (part 1) and 14 mm (part 2) in greatest linear extent  2. PROGNOSTIC INDICATORS Results: IMMUNOHISTOCHEMICAL AND MORPHOMETRIC ANALYSIS PERFORMED MANUALLY The tumor cells are POSITIVE for Her2 (3+). Estrogen Receptor: 0%, NEGATIVE Progesterone Receptor: 0%, NEGATIVE Proliferation Marker Ki67: 20%    07/31/2020 Cancer Staging   Staging form: Breast, AJCC 8th Edition - Clinical stage from 07/31/2020: Stage IV (cT2, cN3, cM1, G2, ER-, PR-, HER2+) - Signed by Burton, Lacie K, NP on 08/31/2020 Stage prefix: Initial diagnosis Nuclear grade: G2 Histologic grading system: 3 grade system   08/13/2020 Initial Diagnosis   Malignant neoplasm of upper-outer quadrant of female breast (HCC)   08/28/2020 PET scan   IMPRESSION: Signs of LEFT breast cancer with LEFT axillary and retropectoral adenopathy as well as LEFT supraclavicular adenopathy.   Signs of pulmonary metastatic disease.   Mild asymmetric uptake in the LEFT as compared to the RIGHT adrenal gland. Equivocal but suspicious, attention on follow-up.   Aortic atherosclerosis.   Cystic changes and potential bronchiectasis in the RIGHT lung base likely post infectious or inflammatory. Correlate with any respiratory symptoms. Comparison with prior imaging may be helpful with attention on follow-up.   08/28/2020 Imaging   BRAIN MRI IMPRESSION: 1. Subacute lacunar infarct of the left basal ganglia with no malignant hemorrhagic transformation or mass effect. Underlying chronic bilateral basal ganglia ischemia. 2. No metastatic disease or other acute intracranial abnormality identified.   08/28/2020  Imaging   Breast MRI   IMPRESSION: 1. 8.1 x 8.1 x 5.1 cm area of biopsy-proven malignancy in the central, upper outer and lower outer quadrants of the left breast. 2. Diffuse anterior left breast skin thickening without abnormal enhancement. This may represent thickening due to lymphedema associated with lymphatic obstruction. 3. Metastatic level 1 and level 2 left axillary adenopathy. 4. No evidence of malignancy on the right.   09/11/2020 - 11/03/2021 Chemotherapy   Patient is on Treatment Plan : BREAST ADO-Trastuzumab Emtansine  (Kadcyla ) q21d     09/11/2020 - 01/04/2023 Chemotherapy   Patient is on Treatment Plan : BREAST ADO-Trastuzumab Emtansine  (Kadcyla ) q21d     11/30/2020 Imaging   CT Chest w/o contrast  IMPRESSION: 1. Interval response to therapy. The left breast mass has mildly decreased in  size in the interval. 2. Decrease in size of left axillary and left retropectoral lymph nodes. 3. Small right lung nodules stable to improved. 4. Coronary artery calcifications noted. 5. Aortic Atherosclerosis (ICD10-I70.0).   03/04/2021 PET scan   IMPRESSION: 1. Overall marked improvement compared to the prior PET-CT, with resolution of hypermetabolic activity and pathologic enlargement of the previously involved lymph nodes; lack of hypermetabolic activity and nonvisualization of prior right-sided lung nodules, and substantial reduction in size and activity in the left breast primary. 2. New ground-glass opacity medially in the right lower lobe with low-grade metabolic activity, highly likely to be inflammatory (alveolitis) given the morphology and appearance. 3. Other imaging findings of potential clinical significance: Aortic Atherosclerosis (ICD10-I70.0). Coronary atherosclerosis. Mitral valve calcifications. Punctate nonobstructive left renal calculi. Lower lumbar impingement due to chronic spondylosis and degenerative disc disease. Remote left basal ganglia infarct.   06/07/2021  Imaging   EXAM: CT CHEST, ABDOMEN AND PELVIS WITHOUT CONTRAST  IMPRESSION: 1. Scattered tiny bilateral pulmonary nodules are similar to the prior exams, favored to be benign. 2. Otherwise, no evidence of metastatic disease within the chest, abdomen, or pelvis. 3. Right lower lobe ground-glass nodule has resolved since the prior PET 4.  Possible constipation. 5. Coronary artery atherosclerosis. Aortic Atherosclerosis (ICD10-I70.0). Emphysema (ICD10-J43.9). 6. Left nephrolithiasis.   06/15/2022 Imaging    IMPRESSION: 1. Mild increase in size of bilateral pulmonary nodules compatible with progression of metastatic disease. 2. No evidence for metastatic disease within the abdomen or pelvis. 3. Large volume of desiccated stool identified within the rectum. Correlate for any clinical signs or symptoms of constipation. 4. Nonobstructing left renal calculus. 5. Aortic Atherosclerosis (ICD10-I70.0).     10/03/2022 Imaging    IMPRESSION: 1. Mild but definite progression of pulmonary metastasis. 2. No extrathoracic metastatic disease identified. 3. Incidental findings, including: Left nephrolithiasis. Coronary artery atherosclerosis. Aortic Atherosclerosis (ICD10-I70.0). Possible constipation.   01/31/2023 -  Chemotherapy   Patient is on Treatment Plan : BREAST METASTATIC Fam-Trastuzumab Deruxtecan-nxki  (Enhertu ) (5.4) q21d         REVIEW OF SYSTEMS:   Constitutional: Denies fevers, chills or abnormal weight loss Eyes: Denies blurriness of vision Ears, nose, mouth, throat, and face: Denies mucositis or sore throat Respiratory: Denies cough, dyspnea or wheezes Cardiovascular: Denies palpitation, chest discomfort or lower extremity swelling Gastrointestinal:  Denies nausea, heartburn or change in bowel habits Skin: Denies abnormal skin rashes Lymphatics: Denies new lymphadenopathy or easy bruising Neurological:Denies numbness, tingling or new weaknesses Behavioral/Psych: Mood is  stable, no new changes  All other systems were reviewed with the patient and are negative.   VITALS:   Today's Vitals   03/21/23 1435 03/21/23 1438  BP: (!) 153/72 (!) 166/65  Pulse: 74   Resp: 18   Temp: 97.7 F (36.5 C)   TempSrc: Temporal   SpO2: 100%   Weight: 156 lb 12.8 oz (71.1 kg)    Body mass index is 26.91 kg/m.   Wt Readings from Last 3 Encounters:  03/21/23 156 lb 12.8 oz (71.1 kg)  02/21/23 156 lb 3.2 oz (70.9 kg)  02/07/23 158 lb 11.2 oz (72 kg)    Body mass index is 26.91 kg/m.  Performance status (ECOG): 1 - Symptomatic but completely ambulatory  PHYSICAL EXAM:   GENERAL:alert, no distress and comfortable SKIN: skin color, texture, turgor are normal, no rashes or significant lesions EYES: normal, Conjunctiva are pink and non-injected, sclera clear OROPHARYNX:no exudate, no erythema and lips, buccal mucosa, and tongue normal  NECK: supple, thyroid  normal size, non-tender, without nodularity LYMPH:  no palpable lymphadenopathy in the cervical, axillary or inguinal LUNGS: clear to auscultation and percussion with normal breathing effort HEART: regular rate & rhythm and no murmurs and no lower extremity edema ABDOMEN:abdomen soft, non-tender and normal bowel sounds Musculoskeletal:no cyanosis of digits and no clubbing  NEURO: alert & oriented x 3 with fluent speech, no focal motor/sensory deficits  LABORATORY DATA:  I have reviewed the data as listed    Component Value Date/Time   NA 143 03/21/2023 1357   K 3.7 03/21/2023 1357   CL 115 (H) 03/21/2023 1357   CO2 25 03/21/2023 1357   GLUCOSE 115 (H) 03/21/2023 1357   BUN 18 03/21/2023 1357   CREATININE 1.38 (H) 03/21/2023 1357   CALCIUM  9.5 03/21/2023 1357   PROT 5.6 (L) 03/21/2023 1357   ALBUMIN  3.1 (L) 03/21/2023 1357   AST 38 03/21/2023 1357   ALT 34 03/21/2023 1357   ALKPHOS 96 03/21/2023 1357   BILITOT 0.8 03/21/2023 1357   GFRNONAA 38 (L) 03/21/2023 1357   GFRAA 39 (L) 06/25/2019 1324      Lab Results  Component Value Date   WBC 5.0 03/21/2023   NEUTROABS 2.7 03/21/2023   HGB 9.8 (L) 03/21/2023   HCT 28.9 (L) 03/21/2023   MCV 83.0 03/21/2023   PLT 112 (L) 03/21/2023     RADIOGRAPHIC STUDIES: DG Chest 2 View Result Date: 02/21/2023 CLINICAL DATA:  Cough for 2 weeks. EXAM: CHEST - 2 VIEW COMPARISON:  October 21, 2022. FINDINGS: The heart size and mediastinal contours are within normal limits. Right-sided Port-A-Cath is unchanged. No consolidative process seen in left lung. Mild right basilar atelectasis or infiltrate is noted with possible associated minimal pleural effusion. The visualized skeletal structures are unremarkable. IMPRESSION: Mild right basilar atelectasis or infiltrate is noted with possible associated minimal right pleural effusion. Electronically Signed   By: Lynwood Landy Raddle M.D.   On: 02/21/2023 11:44

## 2023-03-20 NOTE — Telephone Encounter (Signed)
 Pt called again stating that she's gotten several calls this morning regarding her appts today and transportation.  Pt stated she stood outside at 730am waiting for her hospital transportation which never came; therefore, pt contacted [facility] regarding her transportation pickup.  Pt stated she LVM on Christian's phone regarding her transportation pickup for today.  Pt stated she was called by someone else here in [facility] stating that new transportation was arranged but the transportation never arrived.  Pt then contacted Dr. Demetra office after never being picked up today for her appts.  Pt stated she's tried to contact [facility] regarding her transportation.  Stated Dr. Demetra office will reschedule the pt's lab and f/u appt to 03/21/2023 since the pt is already scheduled for Enhertu  infusion on 03/21/2023 @1500 .  Stated this nurse will send a message to Christian and his team regarding pt's appts for 03/21/2023.  Pt was relieved because pt thought she could not get her Enhertu  infusion on 03/21/2023 since she missed the appts scheduled on 03/20/2023.  Pt had no further questions or concerns.  Pt stated she will LVM for Christian on his telephone regarding transportation needed for 03/21/2023.

## 2023-03-21 ENCOUNTER — Inpatient Hospital Stay: Payer: Medicare HMO

## 2023-03-21 ENCOUNTER — Inpatient Hospital Stay (HOSPITAL_BASED_OUTPATIENT_CLINIC_OR_DEPARTMENT_OTHER): Payer: Medicare HMO | Admitting: Nurse Practitioner

## 2023-03-21 ENCOUNTER — Encounter: Payer: Self-pay | Admitting: Nurse Practitioner

## 2023-03-21 VITALS — BP 166/65 | HR 74 | Temp 97.7°F | Resp 18 | Wt 156.8 lb

## 2023-03-21 DIAGNOSIS — C7800 Secondary malignant neoplasm of unspecified lung: Secondary | ICD-10-CM | POA: Diagnosis not present

## 2023-03-21 DIAGNOSIS — Z9181 History of falling: Secondary | ICD-10-CM | POA: Diagnosis not present

## 2023-03-21 DIAGNOSIS — R296 Repeated falls: Secondary | ICD-10-CM | POA: Diagnosis not present

## 2023-03-21 DIAGNOSIS — M5116 Intervertebral disc disorders with radiculopathy, lumbar region: Secondary | ICD-10-CM | POA: Diagnosis not present

## 2023-03-21 DIAGNOSIS — M4726 Other spondylosis with radiculopathy, lumbar region: Secondary | ICD-10-CM | POA: Diagnosis not present

## 2023-03-21 DIAGNOSIS — Z8673 Personal history of transient ischemic attack (TIA), and cerebral infarction without residual deficits: Secondary | ICD-10-CM | POA: Diagnosis not present

## 2023-03-21 DIAGNOSIS — J439 Emphysema, unspecified: Secondary | ICD-10-CM | POA: Diagnosis not present

## 2023-03-21 DIAGNOSIS — Z79899 Other long term (current) drug therapy: Secondary | ICD-10-CM | POA: Diagnosis not present

## 2023-03-21 DIAGNOSIS — Z171 Estrogen receptor negative status [ER-]: Secondary | ICD-10-CM | POA: Diagnosis not present

## 2023-03-21 DIAGNOSIS — D649 Anemia, unspecified: Secondary | ICD-10-CM

## 2023-03-21 DIAGNOSIS — Z1722 Progesterone receptor negative status: Secondary | ICD-10-CM | POA: Diagnosis not present

## 2023-03-21 DIAGNOSIS — C50412 Malignant neoplasm of upper-outer quadrant of left female breast: Secondary | ICD-10-CM

## 2023-03-21 DIAGNOSIS — Z5112 Encounter for antineoplastic immunotherapy: Secondary | ICD-10-CM | POA: Diagnosis present

## 2023-03-21 DIAGNOSIS — I700 Atherosclerosis of aorta: Secondary | ICD-10-CM | POA: Diagnosis not present

## 2023-03-21 DIAGNOSIS — I6381 Other cerebral infarction due to occlusion or stenosis of small artery: Secondary | ICD-10-CM | POA: Diagnosis not present

## 2023-03-21 DIAGNOSIS — Z95828 Presence of other vascular implants and grafts: Secondary | ICD-10-CM

## 2023-03-21 DIAGNOSIS — N200 Calculus of kidney: Secondary | ICD-10-CM | POA: Diagnosis not present

## 2023-03-21 DIAGNOSIS — I2510 Atherosclerotic heart disease of native coronary artery without angina pectoris: Secondary | ICD-10-CM | POA: Diagnosis not present

## 2023-03-21 LAB — CBC WITH DIFFERENTIAL (CANCER CENTER ONLY)
Abs Immature Granulocytes: 0.01 10*3/uL (ref 0.00–0.07)
Basophils Absolute: 0 10*3/uL (ref 0.0–0.1)
Basophils Relative: 0 %
Eosinophils Absolute: 0.5 10*3/uL (ref 0.0–0.5)
Eosinophils Relative: 9 %
HCT: 28.9 % — ABNORMAL LOW (ref 36.0–46.0)
Hemoglobin: 9.8 g/dL — ABNORMAL LOW (ref 12.0–15.0)
Immature Granulocytes: 0 %
Lymphocytes Relative: 28 %
Lymphs Abs: 1.4 10*3/uL (ref 0.7–4.0)
MCH: 28.2 pg (ref 26.0–34.0)
MCHC: 33.9 g/dL (ref 30.0–36.0)
MCV: 83 fL (ref 80.0–100.0)
Monocytes Absolute: 0.5 10*3/uL (ref 0.1–1.0)
Monocytes Relative: 10 %
Neutro Abs: 2.7 10*3/uL (ref 1.7–7.7)
Neutrophils Relative %: 53 %
Platelet Count: 112 10*3/uL — ABNORMAL LOW (ref 150–400)
RBC: 3.48 MIL/uL — ABNORMAL LOW (ref 3.87–5.11)
RDW: 20.6 % — ABNORMAL HIGH (ref 11.5–15.5)
WBC Count: 5 10*3/uL (ref 4.0–10.5)
nRBC: 0 % (ref 0.0–0.2)

## 2023-03-21 LAB — SAMPLE TO BLOOD BANK

## 2023-03-21 LAB — CMP (CANCER CENTER ONLY)
ALT: 34 U/L (ref 0–44)
AST: 38 U/L (ref 15–41)
Albumin: 3.1 g/dL — ABNORMAL LOW (ref 3.5–5.0)
Alkaline Phosphatase: 96 U/L (ref 38–126)
Anion gap: 3 — ABNORMAL LOW (ref 5–15)
BUN: 18 mg/dL (ref 8–23)
CO2: 25 mmol/L (ref 22–32)
Calcium: 9.5 mg/dL (ref 8.9–10.3)
Chloride: 115 mmol/L — ABNORMAL HIGH (ref 98–111)
Creatinine: 1.38 mg/dL — ABNORMAL HIGH (ref 0.44–1.00)
GFR, Estimated: 38 mL/min — ABNORMAL LOW (ref >60)
Glucose, Bld: 115 mg/dL — ABNORMAL HIGH (ref 70–99)
Potassium: 3.7 mmol/L (ref 3.5–5.1)
Sodium: 143 mmol/L (ref 135–145)
Total Bilirubin: 0.8 mg/dL (ref 0.0–1.2)
Total Protein: 5.6 g/dL — ABNORMAL LOW (ref 6.5–8.1)

## 2023-03-21 MED ORDER — FAM-TRASTUZUMAB DERUXTECAN-NXKI CHEMO 100 MG IV SOLR
4.4000 mg/kg | Freq: Once | INTRAVENOUS | Status: AC
Start: 2023-03-21 — End: 2023-03-21
  Administered 2023-03-21: 300 mg via INTRAVENOUS
  Filled 2023-03-21: qty 15

## 2023-03-21 MED ORDER — DIPHENHYDRAMINE HCL 25 MG PO CAPS
25.0000 mg | ORAL_CAPSULE | Freq: Once | ORAL | Status: AC
  Administered 2023-03-21: 25 mg via ORAL
  Filled 2023-03-21: qty 1

## 2023-03-21 MED ORDER — ACETAMINOPHEN 325 MG PO TABS
650.0000 mg | ORAL_TABLET | Freq: Once | ORAL | Status: AC
  Administered 2023-03-21: 650 mg via ORAL
  Filled 2023-03-21: qty 2

## 2023-03-21 MED ORDER — SODIUM CHLORIDE 0.9 % IV SOLN
150.0000 mg | Freq: Once | INTRAVENOUS | Status: AC
  Administered 2023-03-21: 150 mg via INTRAVENOUS
  Filled 2023-03-21: qty 5
  Filled 2023-03-21: qty 150

## 2023-03-21 MED ORDER — DEXTROSE 5 % IV SOLN: INTRAVENOUS | Status: DC

## 2023-03-21 MED ORDER — HEPARIN SOD (PORK) LOCK FLUSH 100 UNIT/ML IV SOLN
500.0000 [IU] | Freq: Once | INTRAVENOUS | Status: AC | PRN
  Administered 2023-03-21: 500 [IU]

## 2023-03-21 MED ORDER — PALONOSETRON HCL INJECTION 0.25 MG/5ML
0.2500 mg | Freq: Once | INTRAVENOUS | Status: AC
Start: 2023-03-21 — End: 2023-03-21
  Administered 2023-03-21: 0.25 mg via INTRAVENOUS
  Filled 2023-03-21: qty 5

## 2023-03-21 MED ORDER — SODIUM CHLORIDE 0.9% FLUSH
10.0000 mL | INTRAVENOUS | Status: DC | PRN
  Administered 2023-03-21: 10 mL

## 2023-03-21 MED ORDER — SODIUM CHLORIDE 0.9% FLUSH
10.0000 mL | Freq: Once | INTRAVENOUS | Status: AC
  Administered 2023-03-21: 10 mL

## 2023-03-21 MED ORDER — DEXAMETHASONE SODIUM PHOSPHATE 10 MG/ML IJ SOLN
10.0000 mg | Freq: Once | INTRAMUSCULAR | Status: AC
  Administered 2023-03-21: 10 mg via INTRAVENOUS
  Filled 2023-03-21: qty 1

## 2023-03-21 NOTE — Patient Instructions (Signed)

## 2023-03-22 ENCOUNTER — Other Ambulatory Visit: Payer: Self-pay

## 2023-03-22 LAB — FERRITIN: Ferritin: 78 ng/mL (ref 11–307)

## 2023-03-30 ENCOUNTER — Ambulatory Visit: Payer: Self-pay

## 2023-04-01 ENCOUNTER — Other Ambulatory Visit: Payer: Self-pay | Admitting: Physician Assistant

## 2023-04-04 ENCOUNTER — Other Ambulatory Visit: Payer: Medicare HMO

## 2023-04-04 ENCOUNTER — Ambulatory Visit: Payer: Medicare HMO | Admitting: Hematology

## 2023-04-04 ENCOUNTER — Ambulatory Visit: Payer: Medicare HMO

## 2023-04-04 ENCOUNTER — Other Ambulatory Visit: Payer: Self-pay

## 2023-04-10 MED FILL — Fosaprepitant Dimeglumine For IV Infusion 150 MG (Base Eq): INTRAVENOUS | Qty: 5 | Status: AC

## 2023-04-10 NOTE — Progress Notes (Signed)
 Patient Care Team: Christy Atlas, MD as PCP - General (Internal Medicine) Lanny Callander, MD as Consulting Physician (Medical Oncology)  Clinic Day:  04/11/2023  Referring physician: Lanny Callander, MD  ASSESSMENT & PLAN:   Assessment & Plan: Malignant neoplasm of upper-outer quadrant of female breast [facility]) cT2N3M1 stage IV, grade 2-3, ER-/PR- HER2+, with nodal and pulmonary metastases -She initially self palpated a left breast mass around 06/2020. Work-up showed 2 masses in the left breast UOQ and 3 abnormal lymph nodes. 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 -PET scan 08/28/20 showed: hypermetabolism to left breast and axilla, retropectoral and supraclavicular adenopathy, and several 3-6 mm pulmonary nodules. -She started first-line Kadcyla  on 09/11/20. She tolerates well with some fatigue, dose decreased with C4.  -She has had good clinical response, breast mass was no longer palpable on physical exam 06/09/21.  -last PET scan in 01/2022 showed two small lung nodule (5-10mm), slightly bigger since last scan, no other hypermetabolic lesions.   -Her restaging CT scan from June 17, 2022 showed multiple enlarging lung nodules, largest measuring 1 cm, previously 0.6 cm.  This is highly concerning for pulmonary metastasis with slight disease progression. No other new lesions -she tried tucatinib  150mg  twice daily but did not tolerate with excessive fatigue and drowsiness -she restarted Tucatinib  at 50mg  bid last week , she is tolerating well -I increased Tucatinib  to 100mg  bid on 5/29  -She did not tolerate well, with more fatigue, and unstable gait.  She also developed worsening renal function, I stopped Tucatinib  on 08/15/2022 -She is clinically stable, renal function has returned to her baseline, but no improvement in her fatigue and gait.  Will continue holding tucatinib  now and continue Kadcyla   -repeated CT from 10/03/2022 showed mild progression in lung mets (comparing to three previous  scans), but overall low tumor burden and no additional new mets. So we decided to continue Kadcyla  and monitor her disease closely  -She again had a multiple falls lately, and more fatigued.  We discussed a chemo break.  Chemo held after treatment on 10/05/2022 -due to disease progression, treatment changed to Enhertu  on  01/31/23  -02/21/2023 -treatment held due to hemoglobin of 7.9.  1 unit of blood given 02/22/2023. -03/21/2023 -today she presents for cycle 3 day 1 of Enhertu . -04/11/2023 -presents for cycle 4 day 1 Enhertu .   Plan: Labs reviewed  -CBC showing WBC 8.8; Hgb 10.2; Hct 29.7; Plt 142; Anc 76 -CMP - K 3.7; glucose 160; BUN 22; Creatinine 1.35; eGFR 39; Ca 9.6; AST 44; ALT 57; ALKP 97.   Took down and redressed superficial wounds to bilateral elbows.  Recommend Bactroban  ointment, applying small amount twice daily for next 7 days.  Advised her to keep the wounds clean and dry, applying antibiotic ointment and new dressing twice daily.  She can go without a dressing once drainage has resolved. Doxycycline  100 mg twice daily for next 7 days for infection prophylaxis due to superficial infection of bilateral elbows. Long discussion about in-home safety, making sure to have a well lit room, keeping cords off the floor, and keeping her walker near her at all times.  Recommend she always walk with her walker and not use the walls and furniture for balance.  I also advised her not to walk on busy roads or do grocery shopping by herself.  She did voice agreement with these plans. Labs and patient condition satisfactory for cycle 4 day 1 Enhertu . Labs/flush, follow-up with Dr. Wilmer, and cycle  5 in 3 weeks as scheduled.  The patient understands the plans discussed today and is in agreement with them.  She knows to contact our office if she develops concerns prior to her next appointment.  I provided 30 minutes of face-to-face time during this encounter and > 50% was spent counseling as documented  under my assessment and plan.    Powell FORBES Lessen, NP  [facility] - A DEPT OF [facility] [address] [place] [address] Dept: [phone] Dept Fax: [phone]   No orders of the defined types were placed in this encounter.     CHIEF COMPLAINT:  CC: Left breast cancer, estrogen receptor negative  Current Treatment: Second line Enhertu   INTERVAL HISTORY:  Admire is here today for repeat clinical assessment.  Was last seen by me on 03/21/2023.  Has been tolerating this well overall.  Has had intervals of significant anemia requiring blood transfusion.  Today, she presents for cycle 4 day 1 Enhertu .  She admits to following in her home and outside.  She states that outside, she was walking across a busy road.  She panicked and tried to quickly get across the street.  She tripped onto the sidewalk falling onto both elbows.  She does have superficial abrasions to both elbows which have been bleeding.  She currently has them covered with cotton tissue and bandaged.  She states that earlier this morning, she tripped over an electric cord going across her bedroom.  States her foot got caught up in the cord she fell onto both knees.  She does have superficial abrasions to both knees with intact skin.  She states to frequently walking outside with her walker.  States she does not always cross the street in a crosswalk.  States that she will get worried when she sees traffic coming causing her to hurry.  States she has done this several times over the past few months.  Denies dizziness or vertigo.  Does have bilateral leg weakness which is why she uses a walker.  States that at home she does not always use her walker.  Often uses the walls, furniture, counters to help maintain balance.  Will sometimes misjudge where to put her hands and missed the wall or knob.  This will cause her to fall.  We did have long discussion about  safety inside the home and while walking outdoors.  She voiced understanding of all instructions and promised to follow them.  She reports no other problems today. She denies chest pain, chest pressure, or shortness of breath. She denies headaches or visual disturbances. She denies abdominal pain, nausea, vomiting, or changes in bowel or bladder habits.   She denies fevers or chills. She denies pain. Her appetite is good. Her weight has been stable.  I have reviewed the past medical history, past surgical history, social history and family history with the patient and they are unchanged from previous note.  ALLERGIES:  has no known allergies.  MEDICATIONS:  Current Outpatient Medications  Medication Sig Dispense Refill   aspirin  EC 325 MG tablet Take 1 tablet (325 mg total) by mouth daily. 60 tablet 3   atorvastatin  (LIPITOR) 20 MG tablet Take 1 tablet by mouth once daily 60 tablet 0   dexamethasone  (DECADRON ) 4 MG tablet Take 2 tablets (8 mg) by mouth daily for 3 days starting the day after chemotherapy. Take with food. 30 tablet 1   doxycycline  (  VIBRAMYCIN ) 100 MG capsule Take 1 capsule (100 mg total) by mouth 2 (two) times daily. 14 capsule 0   Ferrous Sulfate  (IRON) 325 (65 Fe) MG TABS Take 1 tablet by mouth once daily with breakfast 30 tablet 0   lidocaine -prilocaine  (EMLA ) cream Apply 1 Application topically as needed. 30 g 2   lidocaine -prilocaine  (EMLA ) cream Apply to affected area once 30 g 3   metFORMIN  (GLUCOPHAGE ) 500 MG tablet Take 1 tablet (500 mg total) by mouth 2 (two) times daily with a meal. 60 tablet 1   mupirocin  ointment (BACTROBAN ) 2 % Apply small amount to effected areas twice daily for 7 days 66 g 0   olmesartan  (BENICAR ) 40 MG tablet Take 1 tablet (40 mg total) by mouth daily. 30 tablet 0   potassium chloride  SA (KLOR-CON  M) 20 MEQ tablet Take 1 tablet by mouth once daily 30 tablet 0   tucatinib  (TUKYSA ) 150 MG tablet Take 150 mg by mouth 2 (two) times daily.      ondansetron  (ZOFRAN ) 8 MG tablet Take 1 tablet (8 mg total) by mouth every 8 (eight) hours as needed for nausea or vomiting. Start on the third day after chemotherapy. (Patient not taking: Reported on 04/11/2023) 30 tablet 1   prochlorperazine  (COMPAZINE ) 10 MG tablet Take 1 tablet (10 mg total) by mouth every 6 (six) hours as needed for nausea or vomiting. (Patient not taking: Reported on 04/11/2023) 30 tablet 1   No current facility-administered medications for this visit.   Facility-Administered Medications Ordered in Other Visits  Medication Dose Route Frequency Provider Last Rate Last Admin   dextrose  5 % solution   Intravenous Continuous Lanny Callander, MD 20 mL/hr at 04/11/23 1502 Infusion Verify at 04/11/23 1502   fam-trastuzumab deruxtecan-nxki  (ENHERTU ) 300 mg in dextrose  5 % 100 mL chemo infusion  4.4 mg/kg (Treatment Plan Recorded) Intravenous Once Lanny Callander, MD 230 mL/hr at 04/11/23 1514 300 mg at 04/11/23 1514   heparin  lock flush 100 unit/mL  500 Units Intracatheter Once PRN Lanny Callander, MD       sodium chloride  flush (NS) 0.9 % injection 10 mL  10 mL Intracatheter PRN Lanny Callander, MD        HISTORY OF PRESENT ILLNESS:   Oncology History Overview Note  Cancer Staging Malignant neoplasm of upper-outer quadrant of female breast [facility]) Staging form: Breast, AJCC 8th Edition - Clinical stage from 07/31/2020: Stage IV (cT2, cN3, cM1, G2, ER-, PR-, HER2+) - Signed by Burton, Lacie K, NP on 08/31/2020 Stage prefix: Initial diagnosis Nuclear grade: G2 Histologic grading system: 3 grade system    Malignant neoplasm of upper-outer quadrant of female breast (HCC)  07/24/2020 Breast US    On physical exam, a very firm mass is identified at the 1:30 position of the LEFT breast 5 cm from the nipple.   IMPRESSION: 1. Highly suspicious 4.3 cm mass at the 1:30 position of the LEFT breast and highly suspicious 2.7 cm mass at the 2 o'clock position of the LEFT breast, with the 2 masses encompassing an  area measuring at least 6.1 cm. 2. 3 abnormal LEFT axillary lymph nodes with cortical thickening, tissue sampling of 1 of these lymph nodes recommended. 3. Anterior LEFT breast skin thickening nonspecific but may reflect dermal involvement. 4. No mammographic evidence of RIGHT breast malignancy.   07/31/2020 Initial Biopsy   Diagnosis 1. Breast, left, needle core biopsy, 1 :30 pm, 5cmfn - INVASIVE DUCTAL CARCINOMA, SEE COMMENT. 2. Breast, left, needle core biopsy, 2 o'clock, 8cmfn -  INVASIVE DUCTAL CARCINOMA, SEE COMMENT. - DUCTAL CARCINOMA IN SITU. 3. Lymph node, needle/core biopsy, left axillary - METASTATIC CARCINOMA IN A LYMPH NODE. Microscopic Comment 1. and 3. The carcinoma in both parts has a similar morphology and appears grade 2-3. The DCIS in part 2 is high-grade with necrosis. The carcinoma measures 15 mm (part 1) and 14 mm (part 2) in greatest linear extent  2. PROGNOSTIC INDICATORS Results: IMMUNOHISTOCHEMICAL AND MORPHOMETRIC ANALYSIS PERFORMED MANUALLY The tumor cells are POSITIVE for Her2 (3+). Estrogen Receptor: 0%, NEGATIVE Progesterone Receptor: 0%, NEGATIVE Proliferation Marker Ki67: 20%    07/31/2020 Cancer Staging   Staging form: Breast, AJCC 8th Edition - Clinical stage from 07/31/2020: Stage IV (cT2, cN3, cM1, G2, ER-, PR-, HER2+) - Signed by Burton, Lacie K, NP on 08/31/2020 Stage prefix: Initial diagnosis Nuclear grade: G2 Histologic grading system: 3 grade system   08/13/2020 Initial Diagnosis   Malignant neoplasm of upper-outer quadrant of female breast (HCC)   08/28/2020 PET scan   IMPRESSION: Signs of LEFT breast cancer with LEFT axillary and retropectoral adenopathy as well as LEFT supraclavicular adenopathy.   Signs of pulmonary metastatic disease.   Mild asymmetric uptake in the LEFT as compared to the RIGHT adrenal gland. Equivocal but suspicious, attention on follow-up.   Aortic atherosclerosis.   Cystic changes and potential bronchiectasis  in the RIGHT lung base likely post infectious or inflammatory. Correlate with any respiratory symptoms. Comparison with prior imaging may be helpful with attention on follow-up.   08/28/2020 Imaging   BRAIN MRI IMPRESSION: 1. Subacute lacunar infarct of the left basal ganglia with no malignant hemorrhagic transformation or mass effect. Underlying chronic bilateral basal ganglia ischemia. 2. No metastatic disease or other acute intracranial abnormality identified.   08/28/2020 Imaging   Breast MRI   IMPRESSION: 1. 8.1 x 8.1 x 5.1 cm area of biopsy-proven malignancy in the central, upper outer and lower outer quadrants of the left breast. 2. Diffuse anterior left breast skin thickening without abnormal enhancement. This may represent thickening due to lymphedema associated with lymphatic obstruction. 3. Metastatic level 1 and level 2 left axillary adenopathy. 4. No evidence of malignancy on the right.   09/11/2020 - 11/03/2021 Chemotherapy   Patient is on Treatment Plan : BREAST ADO-Trastuzumab Emtansine  (Kadcyla ) q21d     09/11/2020 - 01/04/2023 Chemotherapy   Patient is on Treatment Plan : BREAST ADO-Trastuzumab Emtansine  (Kadcyla ) q21d     11/30/2020 Imaging   CT Chest w/o contrast  IMPRESSION: 1. Interval response to therapy. The left breast mass has mildly decreased in size in the interval. 2. Decrease in size of left axillary and left retropectoral lymph nodes. 3. Small right lung nodules stable to improved. 4. Coronary artery calcifications noted. 5. Aortic Atherosclerosis (ICD10-I70.0).   03/04/2021 PET scan   IMPRESSION: 1. Overall marked improvement compared to the prior PET-CT, with resolution of hypermetabolic activity and pathologic enlargement of the previously involved lymph nodes; lack of hypermetabolic activity and nonvisualization of prior right-sided lung nodules, and substantial reduction in size and activity in the left breast primary. 2. New ground-glass  opacity medially in the right lower lobe with low-grade metabolic activity, highly likely to be inflammatory (alveolitis) given the morphology and appearance. 3. Other imaging findings of potential clinical significance: Aortic Atherosclerosis (ICD10-I70.0). Coronary atherosclerosis. Mitral valve calcifications. Punctate nonobstructive left renal calculi. Lower lumbar impingement due to chronic spondylosis and degenerative disc disease. Remote left basal ganglia infarct.   06/07/2021 Imaging   EXAM: CT CHEST, ABDOMEN AND PELVIS  WITHOUT CONTRAST  IMPRESSION: 1. Scattered tiny bilateral pulmonary nodules are similar to the prior exams, favored to be benign. 2. Otherwise, no evidence of metastatic disease within the chest, abdomen, or pelvis. 3. Right lower lobe ground-glass nodule has resolved since the prior PET 4.  Possible constipation. 5. Coronary artery atherosclerosis. Aortic Atherosclerosis (ICD10-I70.0). Emphysema (ICD10-J43.9). 6. Left nephrolithiasis.   06/15/2022 Imaging    IMPRESSION: 1. Mild increase in size of bilateral pulmonary nodules compatible with progression of metastatic disease. 2. No evidence for metastatic disease within the abdomen or pelvis. 3. Large volume of desiccated stool identified within the rectum. Correlate for any clinical signs or symptoms of constipation. 4. Nonobstructing left renal calculus. 5. Aortic Atherosclerosis (ICD10-I70.0).     10/03/2022 Imaging    IMPRESSION: 1. Mild but definite progression of pulmonary metastasis. 2. No extrathoracic metastatic disease identified. 3. Incidental findings, including: Left nephrolithiasis. Coronary artery atherosclerosis. Aortic Atherosclerosis (ICD10-I70.0). Possible constipation.   01/31/2023 -  Chemotherapy   Patient is on Treatment Plan : BREAST METASTATIC Fam-Trastuzumab Deruxtecan-nxki  (Enhertu ) (5.4) q21d         REVIEW OF SYSTEMS:   Constitutional: Denies fevers, chills or  abnormal weight loss Eyes: Denies blurriness of vision Ears, nose, mouth, throat, and face: Denies mucositis or sore throat Respiratory: Denies cough, dyspnea or wheezes Cardiovascular: Denies palpitation, chest discomfort or lower extremity swelling Gastrointestinal:  Denies nausea, heartburn or change in bowel habits Skin: Denies abnormal skin rashes.  She does have abrasions to both elbows and both knees which resulted from 2 separate falls in the home and outdoors. Lymphatics: Denies new lymphadenopathy or easy bruising Neurological:Denies numbness, tingling or new weaknesses Behavioral/Psych: Mood is stable, no new changes  All other systems were reviewed with the patient and are negative.   VITALS:   Today's Vitals   04/11/23 1311 04/11/23 1316  BP: (!) 151/119 (!) 156/54  Pulse: 85   Temp: 97.9 F (36.6 C)   TempSrc: Tympanic   Weight: 156 lb (70.8 kg)   PainSc: 3     Body mass index is 26.78 kg/m.   Wt Readings from Last 3 Encounters:  04/11/23 156 lb (70.8 kg)  03/21/23 156 lb 12.8 oz (71.1 kg)  02/21/23 156 lb 3.2 oz (70.9 kg)    Body mass index is 26.78 kg/m.  Performance status (ECOG): 1 - Symptomatic but completely ambulatory  PHYSICAL EXAM:   GENERAL:alert, no distress and comfortable SKIN: skin color, texture, turgor are normal, no rashes.  Both elbows have superficial abrasions with open skin.  Both with purulent drainage present.  Inflammation and tenderness present.  Both knees have superficial abrasions with inflammation and tenderness.  Skin intact. EYES: normal, Conjunctiva are pink and non-injected, sclera clear OROPHARYNX:no exudate, no erythema and lips, buccal mucosa, and tongue normal  NECK: supple, thyroid normal size, non-tender, without nodularity LYMPH:  no palpable lymphadenopathy in the cervical, axillary or inguinal LUNGS: clear to auscultation and percussion with normal breathing effort HEART: regular rate & rhythm and no murmurs and no  lower extremity edema ABDOMEN:abdomen soft, non-tender and normal bowel sounds Musculoskeletal:no cyanosis of digits and no clubbing  NEURO: alert & oriented x 3 with fluent speech, no focal motor/sensory deficits  LABORATORY DATA:  I have reviewed the data as listed    Component Value Date/Time   NA 144 04/11/2023 1218   K 3.7 04/11/2023 1218   CL 115 (H) 04/11/2023 1218   CO2 24 04/11/2023 1218   GLUCOSE 160 (H) 04/11/2023 1218  BUN 22 04/11/2023 1218   CREATININE 1.35 (H) 04/11/2023 1218   CALCIUM  9.6 04/11/2023 1218   PROT 5.8 (L) 04/11/2023 1218   ALBUMIN  3.2 (L) 04/11/2023 1218   AST 44 (H) 04/11/2023 1218   ALT 57 (H) 04/11/2023 1218   ALKPHOS 97 04/11/2023 1218   BILITOT 1.2 04/11/2023 1218   GFRNONAA 39 (L) 04/11/2023 1218   GFRAA 39 (L) 06/25/2019 1324    Lab Results  Component Value Date   WBC 8.8 04/11/2023   NEUTROABS 7.6 04/11/2023   HGB 10.2 (L) 04/11/2023   HCT 29.7 (L) 04/11/2023   MCV 81.8 04/11/2023   PLT 142 (L) 04/11/2023

## 2023-04-10 NOTE — Assessment & Plan Note (Signed)
[id number] stage IV, grade 2-3, ER-/PR- HER2+, with nodal and pulmonary metastases -She initially self palpated a left breast mass around 06/2020. Work-up showed 2 masses in the left breast UOQ and 3 abnormal lymph nodes. 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 -PET scan 08/28/20 showed: hypermetabolism to left breast and axilla, retropectoral and supraclavicular adenopathy, and several 3-6 mm pulmonary nodules. -She started first-line Kadcyla on 09/11/20. She tolerates well with some fatigue, dose decreased with C4.  -She has had good clinical response, breast mass was no longer palpable on physical exam 06/09/21.  -last PET scan in 01/2022 showed two small lung nodule (5-65mm), slightly bigger since last scan, no other hypermetabolic lesions.   -Her restaging CT scan from June 17, 2022 showed multiple enlarging lung nodules, largest measuring 1 cm, previously 0.6 cm.  This is highly concerning for pulmonary metastasis with slight disease progression. No other new lesions -she tried tucatinib 150mg  twice daily but did not tolerate with excessive fatigue and drowsiness -she restarted Tucatinib at 50mg  bid last week , she is tolerating well -I increased Tucatinib to 100mg  bid on 5/29  -She did not tolerate well, with more fatigue, and unstable gait.  She also developed worsening renal function, I stopped Tucatinib on 08/15/2022 -She is clinically stable, renal function has returned to her baseline, but no improvement in her fatigue and gait.  Will continue holding tucatinib now and continue Kadcyla  -repeated CT from 10/03/2022 showed mild progression in lung mets (comparing to three previous scans), but overall low tumor burden and no additional new mets. So we decided to continue Kadcyla and monitor her disease closely  -She again had a multiple falls lately, and more fatigued.  We discussed a chemo break.  Chemo held after treatment on 10/05/2022 -due to disease progression, treatment changed  to Enhertu on  01/31/23  -02/21/2023 -treatment held due to hemoglobin of 7.9.  1 unit of blood given 02/22/2023. -03/21/2023 -today she presents for cycle 3 day 1 of Enhertu. -04/11/2023 -presents for cycle 4 day 1 Enhertu.

## 2023-04-11 ENCOUNTER — Encounter: Payer: Self-pay | Admitting: Nurse Practitioner

## 2023-04-11 ENCOUNTER — Inpatient Hospital Stay: Payer: Medicare HMO | Attending: Hematology

## 2023-04-11 ENCOUNTER — Inpatient Hospital Stay: Payer: Medicare HMO

## 2023-04-11 ENCOUNTER — Inpatient Hospital Stay (HOSPITAL_BASED_OUTPATIENT_CLINIC_OR_DEPARTMENT_OTHER): Payer: Medicare HMO | Admitting: Nurse Practitioner

## 2023-04-11 VITALS — BP 156/54 | HR 85 | Temp 97.9°F | Wt 156.0 lb

## 2023-04-11 DIAGNOSIS — S50311A Abrasion of right elbow, initial encounter: Secondary | ICD-10-CM | POA: Diagnosis not present

## 2023-04-11 DIAGNOSIS — Z171 Estrogen receptor negative status [ER-]: Secondary | ICD-10-CM | POA: Insufficient documentation

## 2023-04-11 DIAGNOSIS — C7802 Secondary malignant neoplasm of left lung: Secondary | ICD-10-CM | POA: Diagnosis not present

## 2023-04-11 DIAGNOSIS — M5116 Intervertebral disc disorders with radiculopathy, lumbar region: Secondary | ICD-10-CM | POA: Diagnosis not present

## 2023-04-11 DIAGNOSIS — I6381 Other cerebral infarction due to occlusion or stenosis of small artery: Secondary | ICD-10-CM | POA: Insufficient documentation

## 2023-04-11 DIAGNOSIS — C50412 Malignant neoplasm of upper-outer quadrant of left female breast: Secondary | ICD-10-CM

## 2023-04-11 DIAGNOSIS — C7801 Secondary malignant neoplasm of right lung: Secondary | ICD-10-CM | POA: Insufficient documentation

## 2023-04-11 DIAGNOSIS — R5383 Other fatigue: Secondary | ICD-10-CM | POA: Insufficient documentation

## 2023-04-11 DIAGNOSIS — Z5112 Encounter for antineoplastic immunotherapy: Secondary | ICD-10-CM | POA: Insufficient documentation

## 2023-04-11 DIAGNOSIS — R0602 Shortness of breath: Secondary | ICD-10-CM | POA: Insufficient documentation

## 2023-04-11 DIAGNOSIS — Z79899 Other long term (current) drug therapy: Secondary | ICD-10-CM | POA: Insufficient documentation

## 2023-04-11 DIAGNOSIS — S80212A Abrasion, left knee, initial encounter: Secondary | ICD-10-CM | POA: Diagnosis not present

## 2023-04-11 DIAGNOSIS — J439 Emphysema, unspecified: Secondary | ICD-10-CM | POA: Diagnosis not present

## 2023-04-11 DIAGNOSIS — S80211A Abrasion, right knee, initial encounter: Secondary | ICD-10-CM | POA: Insufficient documentation

## 2023-04-11 DIAGNOSIS — D649 Anemia, unspecified: Secondary | ICD-10-CM

## 2023-04-11 DIAGNOSIS — Z1722 Progesterone receptor negative status: Secondary | ICD-10-CM | POA: Diagnosis not present

## 2023-04-11 DIAGNOSIS — R296 Repeated falls: Secondary | ICD-10-CM | POA: Diagnosis not present

## 2023-04-11 DIAGNOSIS — M4726 Other spondylosis with radiculopathy, lumbar region: Secondary | ICD-10-CM | POA: Diagnosis not present

## 2023-04-11 DIAGNOSIS — Z91199 Patient's noncompliance with other medical treatment and regimen due to unspecified reason: Secondary | ICD-10-CM | POA: Insufficient documentation

## 2023-04-11 DIAGNOSIS — S50312A Abrasion of left elbow, initial encounter: Secondary | ICD-10-CM | POA: Insufficient documentation

## 2023-04-11 DIAGNOSIS — N200 Calculus of kidney: Secondary | ICD-10-CM | POA: Diagnosis not present

## 2023-04-11 DIAGNOSIS — Z95828 Presence of other vascular implants and grafts: Secondary | ICD-10-CM

## 2023-04-11 DIAGNOSIS — I700 Atherosclerosis of aorta: Secondary | ICD-10-CM | POA: Diagnosis not present

## 2023-04-11 DIAGNOSIS — Z8673 Personal history of transient ischemic attack (TIA), and cerebral infarction without residual deficits: Secondary | ICD-10-CM | POA: Insufficient documentation

## 2023-04-11 DIAGNOSIS — I2510 Atherosclerotic heart disease of native coronary artery without angina pectoris: Secondary | ICD-10-CM | POA: Insufficient documentation

## 2023-04-11 DIAGNOSIS — E8770 Fluid overload, unspecified: Secondary | ICD-10-CM | POA: Diagnosis not present

## 2023-04-11 DIAGNOSIS — M7989 Other specified soft tissue disorders: Secondary | ICD-10-CM | POA: Insufficient documentation

## 2023-04-11 LAB — CMP (CANCER CENTER ONLY)
ALT: 57 U/L — ABNORMAL HIGH (ref 0–44)
AST: 44 U/L — ABNORMAL HIGH (ref 15–41)
Albumin: 3.2 g/dL — ABNORMAL LOW (ref 3.5–5.0)
Alkaline Phosphatase: 97 U/L (ref 38–126)
Anion gap: 5 (ref 5–15)
BUN: 22 mg/dL (ref 8–23)
CO2: 24 mmol/L (ref 22–32)
Calcium: 9.6 mg/dL (ref 8.9–10.3)
Chloride: 115 mmol/L — ABNORMAL HIGH (ref 98–111)
Creatinine: 1.35 mg/dL — ABNORMAL HIGH (ref 0.44–1.00)
GFR, Estimated: 39 mL/min — ABNORMAL LOW (ref >60)
Glucose, Bld: 160 mg/dL — ABNORMAL HIGH (ref 70–99)
Potassium: 3.7 mmol/L (ref 3.5–5.1)
Sodium: 144 mmol/L (ref 135–145)
Total Bilirubin: 1.2 mg/dL (ref 0.0–1.2)
Total Protein: 5.8 g/dL — ABNORMAL LOW (ref 6.5–8.1)

## 2023-04-11 LAB — CBC WITH DIFFERENTIAL (CANCER CENTER ONLY)
Abs Immature Granulocytes: 0.09 10*3/uL — ABNORMAL HIGH (ref 0.00–0.07)
Basophils Absolute: 0 10*3/uL (ref 0.0–0.1)
Basophils Relative: 0 %
Eosinophils Absolute: 0 10*3/uL (ref 0.0–0.5)
Eosinophils Relative: 0 %
HCT: 29.7 % — ABNORMAL LOW (ref 36.0–46.0)
Hemoglobin: 10.2 g/dL — ABNORMAL LOW (ref 12.0–15.0)
Immature Granulocytes: 1 %
Lymphocytes Relative: 10 %
Lymphs Abs: 0.9 10*3/uL (ref 0.7–4.0)
MCH: 28.1 pg (ref 26.0–34.0)
MCHC: 34.3 g/dL (ref 30.0–36.0)
MCV: 81.8 fL (ref 80.0–100.0)
Monocytes Absolute: 0.2 10*3/uL (ref 0.1–1.0)
Monocytes Relative: 3 %
Neutro Abs: 7.6 10*3/uL (ref 1.7–7.7)
Neutrophils Relative %: 86 %
Platelet Count: 142 10*3/uL — ABNORMAL LOW (ref 150–400)
RBC: 3.63 MIL/uL — ABNORMAL LOW (ref 3.87–5.11)
RDW: 20.7 % — ABNORMAL HIGH (ref 11.5–15.5)
WBC Count: 8.8 10*3/uL (ref 4.0–10.5)
nRBC: 0.2 % (ref 0.0–0.2)

## 2023-04-11 LAB — SAMPLE TO BLOOD BANK

## 2023-04-11 MED ORDER — DEXAMETHASONE SODIUM PHOSPHATE 10 MG/ML IJ SOLN
10.0000 mg | Freq: Once | INTRAMUSCULAR | Status: AC
  Administered 2023-04-11: 10 mg via INTRAVENOUS
  Filled 2023-04-11: qty 1

## 2023-04-11 MED ORDER — ACETAMINOPHEN 325 MG PO TABS
650.0000 mg | ORAL_TABLET | Freq: Once | ORAL | Status: AC
  Administered 2023-04-11: 650 mg via ORAL
  Filled 2023-04-11: qty 2

## 2023-04-11 MED ORDER — HEPARIN SOD (PORK) LOCK FLUSH 100 UNIT/ML IV SOLN
500.0000 [IU] | Freq: Once | INTRAVENOUS | Status: AC | PRN
  Administered 2023-04-11: 500 [IU]

## 2023-04-11 MED ORDER — FAM-TRASTUZUMAB DERUXTECAN-NXKI CHEMO 100 MG IV SOLR
4.4000 mg/kg | Freq: Once | INTRAVENOUS | Status: AC
  Administered 2023-04-11: 300 mg via INTRAVENOUS
  Filled 2023-04-11: qty 15

## 2023-04-11 MED ORDER — SODIUM CHLORIDE 0.9% FLUSH
10.0000 mL | INTRAVENOUS | Status: DC | PRN
  Administered 2023-04-11: 10 mL

## 2023-04-11 MED ORDER — DEXTROSE 5 % IV SOLN
INTRAVENOUS | Status: DC
Start: 2023-04-11 — End: 2023-04-11

## 2023-04-11 MED ORDER — MUPIROCIN 2 % EX OINT: TOPICAL_OINTMENT | CUTANEOUS | 0 refills | Status: DC

## 2023-04-11 MED ORDER — DOXYCYCLINE HYCLATE 100 MG PO CAPS: 100.0000 mg | ORAL_CAPSULE | Freq: Two times a day (BID) | ORAL | 0 refills | Status: DC

## 2023-04-11 MED ORDER — PALONOSETRON HCL INJECTION 0.25 MG/5ML
0.2500 mg | Freq: Once | INTRAVENOUS | Status: AC
  Administered 2023-04-11: 0.25 mg via INTRAVENOUS
  Filled 2023-04-11: qty 5

## 2023-04-11 MED ORDER — SODIUM CHLORIDE 0.9% FLUSH
10.0000 mL | Freq: Once | INTRAVENOUS | Status: AC
  Administered 2023-04-11: 10 mL

## 2023-04-11 MED ORDER — DIPHENHYDRAMINE HCL 25 MG PO CAPS
25.0000 mg | ORAL_CAPSULE | Freq: Once | ORAL | Status: AC
  Administered 2023-04-11: 25 mg via ORAL
  Filled 2023-04-11: qty 1

## 2023-04-11 MED ORDER — SODIUM CHLORIDE 0.9 % IV SOLN
150.0000 mg | Freq: Once | INTRAVENOUS | Status: AC
  Administered 2023-04-11: 150 mg via INTRAVENOUS
  Filled 2023-04-11: qty 150

## 2023-04-11 NOTE — Patient Instructions (Signed)
 [facility] - A DEPT OF [facility]  Discharge Instructions: Thank you for choosing [facility] to provide your oncology and hematology care.   If you have a lab appointment with the Cancer Center, please go directly to the Cancer Center and check in at the registration area.   Wear comfortable clothing and clothing appropriate for easy access to any Portacath or PICC line.   We strive to give you quality time with your provider. You may need to reschedule your appointment if you arrive late (15 or more minutes).  Arriving late affects you and other patients whose appointments are after yours.  Also, if you miss three or more appointments without notifying the office, you may be dismissed from the clinic at the provider's discretion.      For prescription refill requests, have your pharmacy contact our office and allow 72 hours for refills to be completed.    Today you received the following chemotherapy and/or immunotherapy agents: fam-trastuzumab deruxtecan-nxki      To help prevent nausea and vomiting after your treatment, we encourage you to take your nausea medication as directed.  BELOW ARE SYMPTOMS THAT SHOULD BE REPORTED IMMEDIATELY: *FEVER GREATER THAN 100.4 F (38 C) OR HIGHER *CHILLS OR SWEATING *NAUSEA AND VOMITING THAT IS NOT CONTROLLED WITH YOUR NAUSEA MEDICATION *UNUSUAL SHORTNESS OF BREATH *UNUSUAL BRUISING OR BLEEDING *URINARY PROBLEMS (pain or burning when urinating, or frequent urination) *BOWEL PROBLEMS (unusual diarrhea, constipation, pain near the anus) TENDERNESS IN MOUTH AND THROAT WITH OR WITHOUT PRESENCE OF ULCERS (sore throat, sores in mouth, or a toothache) UNUSUAL RASH, SWELLING OR PAIN  UNUSUAL VAGINAL DISCHARGE OR ITCHING   Items with * indicate a potential emergency and should be followed up as soon as possible or go to the Emergency Department if any problems should occur.  Please show the CHEMOTHERAPY ALERT  CARD or IMMUNOTHERAPY ALERT CARD at check-in to the Emergency Department and triage nurse.  Should you have questions after your visit or need to cancel or reschedule your appointment, please contact [facility] - A DEPT OF [facility]  Dept: [phone]  and follow the prompts.  Office hours are 8:00 a.m. to 4:30 p.m. Monday - Friday. Please note that voicemails left after 4:00 p.m. may not be returned until the following business day.  We are closed weekends and major holidays. You have access to a nurse at all times for urgent questions. Please call the main number to the clinic Dept: [phone] and follow the prompts.   For any non-urgent questions, you may also contact your provider using MyChart. We now offer e-Visits for anyone 32 and older to request care online for non-urgent symptoms. For details visit [email or web address].   Also download the MyChart app! Go to the app store, search "MyChart", open the app, select [place], and log in with your MyChart username and password.

## 2023-04-11 NOTE — Progress Notes (Signed)
Pt. Here for port flush/labs.  C/O falling today and a few days ago with injuries to her knees and elbows.  Secure chatted to Heather/NP and John/CMA

## 2023-04-24 ENCOUNTER — Emergency Department (HOSPITAL_COMMUNITY)
Admission: EM | Admit: 2023-04-24 | Discharge: 2023-04-25 | Disposition: A | Discharge status: Home / Self Care | Payer: Medicare HMO | Attending: Emergency Medicine | Admitting: Emergency Medicine

## 2023-04-24 ENCOUNTER — Other Ambulatory Visit: Payer: Self-pay

## 2023-04-24 ENCOUNTER — Encounter (HOSPITAL_COMMUNITY): Payer: Self-pay

## 2023-04-24 ENCOUNTER — Emergency Department (HOSPITAL_COMMUNITY): Payer: Medicare HMO

## 2023-04-24 DIAGNOSIS — E119 Type 2 diabetes mellitus without complications: Secondary | ICD-10-CM | POA: Diagnosis not present

## 2023-04-24 DIAGNOSIS — R600 Localized edema: Secondary | ICD-10-CM | POA: Diagnosis not present

## 2023-04-24 DIAGNOSIS — Z8673 Personal history of transient ischemic attack (TIA), and cerebral infarction without residual deficits: Secondary | ICD-10-CM | POA: Diagnosis not present

## 2023-04-24 DIAGNOSIS — R0789 Other chest pain: Secondary | ICD-10-CM | POA: Insufficient documentation

## 2023-04-24 DIAGNOSIS — Z7982 Long term (current) use of aspirin: Secondary | ICD-10-CM | POA: Insufficient documentation

## 2023-04-24 DIAGNOSIS — Z79899 Other long term (current) drug therapy: Secondary | ICD-10-CM | POA: Diagnosis not present

## 2023-04-24 DIAGNOSIS — Z7984 Long term (current) use of oral hypoglycemic drugs: Secondary | ICD-10-CM | POA: Insufficient documentation

## 2023-04-24 DIAGNOSIS — R1310 Dysphagia, unspecified: Secondary | ICD-10-CM | POA: Insufficient documentation

## 2023-04-24 LAB — CBC
HCT: 28.6 % — ABNORMAL LOW (ref 36.0–46.0)
Hemoglobin: 9.3 g/dL — ABNORMAL LOW (ref 12.0–15.0)
MCH: 28.7 pg (ref 26.0–34.0)
MCHC: 32.5 g/dL (ref 30.0–36.0)
MCV: 88.3 fL (ref 80.0–100.0)
Platelets: 95 10*3/uL — ABNORMAL LOW (ref 150–400)
RBC: 3.24 MIL/uL — ABNORMAL LOW (ref 3.87–5.11)
RDW: 20.3 % — ABNORMAL HIGH (ref 11.5–15.5)
WBC: 9.6 10*3/uL (ref 4.0–10.5)
nRBC: 0 % (ref 0.0–0.2)

## 2023-04-24 LAB — BASIC METABOLIC PANEL
Anion gap: 6 (ref 5–15)
BUN: 28 mg/dL — ABNORMAL HIGH (ref 8–23)
CO2: 21 mmol/L — ABNORMAL LOW (ref 22–32)
Calcium: 8.8 mg/dL — ABNORMAL LOW (ref 8.9–10.3)
Chloride: 112 mmol/L — ABNORMAL HIGH (ref 98–111)
Creatinine, Ser: 1.5 mg/dL — ABNORMAL HIGH (ref 0.44–1.00)
GFR, Estimated: 34 mL/min — ABNORMAL LOW (ref >60)
Glucose, Bld: 110 mg/dL — ABNORMAL HIGH (ref 70–99)
Potassium: 4.3 mmol/L (ref 3.5–5.1)
Sodium: 139 mmol/L (ref 135–145)

## 2023-04-24 LAB — BRAIN NATRIURETIC PEPTIDE: B Natriuretic Peptide: 65.2 pg/mL (ref 0.0–100.0)

## 2023-04-24 LAB — TROPONIN I (HIGH SENSITIVITY): Troponin I (High Sensitivity): 23 ng/L — ABNORMAL HIGH (ref <18)

## 2023-04-24 NOTE — ED Provider Triage Note (Signed)
 Emergency Medicine Provider Triage Evaluation Note  Christy [name] , a 83 y.o. female  was evaluated in triage.  Pt complains of intermittent chest pain for the past 3 weeks after she swallowed a pill.  States that sometimes her food seems to get stuck.  She also reports leg swelling in both of her legs that she noticed this morning.  Denies any shortness of breath.  Denies any history of heart failure.  She is a breast cancer patient currently undergoing treatment.  Review of Systems  Positive: As above Negative: As above  Physical Exam  BP (!) 172/58   Pulse 74   Temp 98.5 F (36.9 C) (Oral)   Resp 17   Ht 5\' 4"  (1.626 m)   Wt 78 kg   SpO2 97%   BMI 29.52 kg/m  Gen:   Awake, no distress   Resp:  Normal effort  MSK:   Moves extremities without difficulty    Medical Decision Making  Medically screening exam initiated at 7:40 PM.  Appropriate orders placed.  IVIE SAVITT was informed that the remainder of the evaluation will be completed by another provider, this initial triage assessment does not replace that evaluation, and the importance of remaining in the ED until their evaluation is complete.     Arabella Merles, PA-C 04/24/23 1941

## 2023-04-24 NOTE — ED Triage Notes (Signed)
 Intermittent chest pain for 3 weeks. Pt states she is having trouble swallowing, she has pain when swallowing and then her chest starts hurting. Bilateral leg swelling since this AM.

## 2023-04-25 ENCOUNTER — Other Ambulatory Visit: Payer: Medicare HMO

## 2023-04-25 ENCOUNTER — Ambulatory Visit: Payer: Medicare HMO | Admitting: Hematology

## 2023-04-25 ENCOUNTER — Ambulatory Visit: Payer: Medicare HMO

## 2023-04-25 LAB — TROPONIN I (HIGH SENSITIVITY): Troponin I (High Sensitivity): 29 ng/L — ABNORMAL HIGH (ref <18)

## 2023-04-25 MED ORDER — FUROSEMIDE 20 MG PO TABS: 20.0000 mg | ORAL_TABLET | Freq: Every day | ORAL | 0 refills | Status: DC

## 2023-04-25 NOTE — ED Provider Notes (Signed)
 [facility] EMERGENCY DEPARTMENT AT [facility] Provider Note  CSN: [id number] Arrival date & time: 04/24/23 1823  Chief Complaint(s) Dysphagia and Chest Pain  HPI Christy [name] is a 83 y.o. female here for several weeks of intermittent food bolus sensation.  Patient states that at times she feels like her food gets stuck in her chest.  She can usually clear it by drinking fluids.  She endorses chest discomfort related to this which clears once the food passes.  She denies any nausea or vomiting.  She is able to tolerate her own secretions.  No associated abdominal pain.  Family also noted patient had peripheral edema today.  This is not something that they have noted previously.  HPI  Past Medical History Past Medical History:  Diagnosis Date   Diabetes mellitus without complication (HCC)    Hyperlipidemia    Hypertension    Patient Active Problem List   Diagnosis Date Noted   Goals of care, counseling/discussion 07/13/2022   Multiple falls 05/09/2022   Port-A-Cath in place 10/23/2020   Stroke (cerebrum) (HCC) 09/17/2020   Malignant neoplasm of upper-outer quadrant of female breast (HCC) 08/13/2020   Home Medication(s) Prior to Admission medications   Medication Sig Start Date End Date Taking? Authorizing Provider  furosemide (LASIX) 20 MG tablet Take 1 tablet (20 mg total) by mouth daily for 5 days. 04/25/23 04/30/23 Yes Ivor Kishi, Amadeo Garnet, MD  aspirin EC 325 MG tablet Take 1 tablet (325 mg total) by mouth daily. 01/04/23   Pollyann Samples, NP  atorvastatin (LIPITOR) 20 MG tablet Take 1 tablet by mouth once daily 01/31/23   Vaslow, Georgeanna Lea, MD  dexamethasone (DECADRON) 4 MG tablet Take 2 tablets (8 mg) by mouth daily for 3 days starting the day after chemotherapy. Take with food. 01/04/23   Malachy Mood, MD  doxycycline (VIBRAMYCIN) 100 MG capsule Take 1 capsule (100 mg total) by mouth 2 (two) times daily. 04/11/23   Carlean Jews, NP  Ferrous Sulfate (IRON) 325  (65 Fe) MG TABS Take 1 tablet by mouth once daily with breakfast 04/03/23   Georga Kaufmann T, PA-C  lidocaine-prilocaine (EMLA) cream Apply 1 Application topically as needed. 10/26/22   Malachy Mood, MD  lidocaine-prilocaine (EMLA) cream Apply to affected area once 01/04/23   Malachy Mood, MD  metFORMIN (GLUCOPHAGE) 500 MG tablet Take 1 tablet (500 mg total) by mouth 2 (two) times daily with a meal. 09/27/19   Janace Aris, FNP  mupirocin ointment (BACTROBAN) 2 % Apply small amount to effected areas twice daily for 7 days 04/11/23   Carlean Jews, NP  olmesartan (BENICAR) 40 MG tablet Take 1 tablet (40 mg total) by mouth daily. 01/04/23   Pollyann Samples, NP  ondansetron (ZOFRAN) 8 MG tablet Take 1 tablet (8 mg total) by mouth every 8 (eight) hours as needed for nausea or vomiting. Start on the third day after chemotherapy. Patient not taking: Reported on 04/11/2023 01/04/23   Malachy Mood, MD  potassium chloride SA (KLOR-CON M) 20 MEQ tablet Take 1 tablet by mouth once daily 09/19/22   Pollyann Samples, NP  prochlorperazine (COMPAZINE) 10 MG tablet Take 1 tablet (10 mg total) by mouth every 6 (six) hours as needed for nausea or vomiting. Patient not taking: Reported on 04/11/2023 01/04/23   Malachy Mood, MD  tucatinib (TUKYSA) 150 MG tablet Take 150 mg by mouth 2 (two) times daily.    [provider]  Allergies Patient has no known allergies.  Review of Systems Review of Systems As noted in HPI  Physical Exam Vital Signs  I have reviewed the triage vital signs BP (!) 169/98 (BP Location: Right Arm)   Pulse 84   Temp 98.3 F (36.8 C) (Oral)   Resp 16   Ht 5\' 4"  (1.626 m)   Wt 78 kg   SpO2 100%   BMI 29.52 kg/m   Physical Exam Vitals reviewed.  Constitutional:      General: She is not in acute distress.    Appearance: She is well-developed. She is not  diaphoretic.  HENT:     Head: Normocephalic and atraumatic.     Nose: Nose normal.  Eyes:     General: No scleral icterus.       Right eye: No discharge.        Left eye: No discharge.     Conjunctiva/sclera: Conjunctivae normal.     Pupils: Pupils are equal, round, and reactive to light.  Cardiovascular:     Rate and Rhythm: Normal rate and regular rhythm.     Heart sounds: No murmur heard.    No friction rub. No gallop.  Pulmonary:     Effort: Pulmonary effort is normal. No respiratory distress.     Breath sounds: Normal breath sounds. No stridor. No rales.  Abdominal:     General: There is no distension.     Palpations: Abdomen is soft.     Tenderness: There is no abdominal tenderness.  Musculoskeletal:        General: No tenderness.     Cervical back: Normal range of motion and neck supple.     Right lower leg: 2+ Pitting Edema present.     Left lower leg: 2+ Pitting Edema present.  Skin:    General: Skin is warm and dry.     Findings: No erythema or rash.  Neurological:     Mental Status: She is alert and oriented to person, place, and time.     ED Results and Treatments Labs (all labs ordered are listed, but only abnormal results are displayed) Labs Reviewed  BASIC METABOLIC PANEL - Abnormal; Notable for the following components:      Result Value   Chloride 112 (*)    CO2 21 (*)    Glucose, Bld 110 (*)    BUN 28 (*)    Creatinine, Ser 1.50 (*)    Calcium 8.8 (*)    GFR, Estimated 34 (*)    All other components within normal limits  CBC - Abnormal; Notable for the following components:   RBC 3.24 (*)    Hemoglobin 9.3 (*)    HCT 28.6 (*)    RDW 20.3 (*)    Platelets 95 (*)    All other components within normal limits  TROPONIN I (HIGH SENSITIVITY) - Abnormal; Notable for the following components:   Troponin I (High Sensitivity) 23 (*)    All other components within normal limits  TROPONIN I (HIGH SENSITIVITY) - Abnormal; Notable for the following  components:   Troponin I (High Sensitivity) 29 (*)    All other components within normal limits  BRAIN NATRIURETIC PEPTIDE  EKG  EKG Interpretation Date/Time:  Monday April 24 2023 19:26:25 EST Ventricular Rate:  80 PR Interval:  134 QRS Duration:  95 QT Interval:  358 QTC Calculation: 413 R Axis:   20  Text Interpretation: Sinus rhythm Confirmed by Drema Pry [phone]) on 04/25/2023 2:41:42 AM       Radiology DG Chest 2 View Result Date: 04/24/2023 CLINICAL DATA:  Chest pain EXAM: CHEST - 2 VIEW COMPARISON:  02/21/2023 FINDINGS: Right Port-A-Cath tip at low SVC. Reverse apical lordotic positioning on the frontal. Midline trachea. Borderline cardiomegaly. No pleural effusion or pneumothorax. Lower lung predominant interstitial thickening is nonspecific, most likely due to remote smoking per EMR. Favor scarring at the lateral right lung base, with improved aeration. IMPRESSION: No acute process or explanation for chest pain. Electronically Signed   By: Jeronimo Greaves M.D.   On: 04/24/2023 20:50    Medications Ordered in ED Medications - No data to display Procedures Procedures  (including critical care time) Medical Decision Making / ED Course   Medical Decision Making Amount and/or Complexity of Data Reviewed Labs: ordered. Decision-making details documented in ED Course. Radiology: ordered and independent interpretation performed. Decision-making details documented in ED Course. ECG/medicine tests: ordered and independent interpretation performed. Decision-making details documented in ED Course.  Risk Prescription drug management.    Patient presents with food bolus sensation for the past several weeks.  Tolerating p.o.  Currently asymptomatic. Unlikely cardiac etiology.  EKG without acute ischemic changes.  Troponins elevated but relatively flat x 2.   Not concerning for ACS.  Given the peripheral edema, BNP obtained and negative.    Chest x-ray without evidence of pulmonary edema. Doubt heart failure.  Patient does have renal insufficiency without overt AKI.  No significant electrolyte derangements. Possibly dependent. Compression socks recommended. Rx for short course of lasix. Close PCP f/u recommended.  GI follow up recommended for bolus sensation. Food consistency changes recommended.    Final Clinical Impression(s) / ED Diagnoses Final diagnoses:  Dysphagia, unspecified type  Odynophagia  Peripheral edema   The patient appears reasonably screened and/or stabilized for discharge and I doubt any other medical condition or other [facility] requiring further screening, evaluation, or treatment in the ED at this time. I have discussed the findings, Dx and Tx plan with the patient/family who expressed understanding and agree(s) with the plan. Discharge instructions discussed at length. The patient/family was given strict return precautions who verbalized understanding of the instructions. No further questions at time of discharge.  Disposition: Discharge  Condition: Good  ED Discharge Orders          Ordered    furosemide (LASIX) 20 MG tablet  Daily        04/25/23 0427             Follow Up: [name], MD [address] [place] [address] [phone]  Call  to schedule an appointment for close follow up  Kathi Der, MD [address] [place] [address] [phone]  Call  to schedule an appointment for close follow up    This chart was dictated using voice recognition software.  Despite best efforts to proofread,  errors can occur which can change the documentation meaning.    Nira Conn, MD 04/25/23 [phone]

## 2023-05-01 MED FILL — Fosaprepitant Dimeglumine For IV Infusion 150 MG (Base Eq): INTRAVENOUS | Qty: 5 | Status: AC

## 2023-05-02 ENCOUNTER — Other Ambulatory Visit: Payer: Self-pay

## 2023-05-02 ENCOUNTER — Inpatient Hospital Stay: Payer: Medicare HMO

## 2023-05-02 ENCOUNTER — Ambulatory Visit (HOSPITAL_COMMUNITY)
Admission: RE | Admit: 2023-05-02 | Discharge: 2023-05-02 | Disposition: A | Discharge status: Home / Self Care | Payer: Medicare HMO | Source: Ambulatory Visit | Attending: Hematology | Admitting: Hematology

## 2023-05-02 ENCOUNTER — Inpatient Hospital Stay (HOSPITAL_BASED_OUTPATIENT_CLINIC_OR_DEPARTMENT_OTHER): Payer: Medicare HMO | Admitting: Hematology

## 2023-05-02 ENCOUNTER — Encounter: Payer: Self-pay | Admitting: Hematology

## 2023-05-02 VITALS — BP 181/78 | HR 92 | Temp 98.2°F | Resp 17 | Wt 158.5 lb

## 2023-05-02 DIAGNOSIS — Z95828 Presence of other vascular implants and grafts: Secondary | ICD-10-CM

## 2023-05-02 DIAGNOSIS — R0602 Shortness of breath: Secondary | ICD-10-CM

## 2023-05-02 DIAGNOSIS — C50412 Malignant neoplasm of upper-outer quadrant of left female breast: Secondary | ICD-10-CM | POA: Insufficient documentation

## 2023-05-02 DIAGNOSIS — Z171 Estrogen receptor negative status [ER-]: Secondary | ICD-10-CM

## 2023-05-02 DIAGNOSIS — D649 Anemia, unspecified: Secondary | ICD-10-CM

## 2023-05-02 DIAGNOSIS — Z5112 Encounter for antineoplastic immunotherapy: Secondary | ICD-10-CM | POA: Diagnosis not present

## 2023-05-02 LAB — CMP (CANCER CENTER ONLY)
ALT: 60 U/L — ABNORMAL HIGH (ref 0–44)
AST: 62 U/L — ABNORMAL HIGH (ref 15–41)
Albumin: 2.5 g/dL — ABNORMAL LOW (ref 3.5–5.0)
Alkaline Phosphatase: 135 U/L — ABNORMAL HIGH (ref 38–126)
Anion gap: 7 (ref 5–15)
BUN: 14 mg/dL (ref 8–23)
CO2: 23 mmol/L (ref 22–32)
Calcium: 8.5 mg/dL — ABNORMAL LOW (ref 8.9–10.3)
Chloride: 111 mmol/L (ref 98–111)
Creatinine: 1.38 mg/dL — ABNORMAL HIGH (ref 0.44–1.00)
GFR, Estimated: 38 mL/min — ABNORMAL LOW (ref >60)
Glucose, Bld: 121 mg/dL — ABNORMAL HIGH (ref 70–99)
Potassium: 3.4 mmol/L — ABNORMAL LOW (ref 3.5–5.1)
Sodium: 141 mmol/L (ref 135–145)
Total Bilirubin: 1.6 mg/dL — ABNORMAL HIGH (ref 0.0–1.2)
Total Protein: 4.8 g/dL — ABNORMAL LOW (ref 6.5–8.1)

## 2023-05-02 LAB — CBC WITH DIFFERENTIAL (CANCER CENTER ONLY)
Abs Immature Granulocytes: 0.07 10*3/uL (ref 0.00–0.07)
Basophils Absolute: 0 10*3/uL (ref 0.0–0.1)
Basophils Relative: 0 %
Eosinophils Absolute: 0.2 10*3/uL (ref 0.0–0.5)
Eosinophils Relative: 3 %
HCT: 24.8 % — ABNORMAL LOW (ref 36.0–46.0)
Hemoglobin: 8.7 g/dL — ABNORMAL LOW (ref 12.0–15.0)
Immature Granulocytes: 1 %
Lymphocytes Relative: 17 %
Lymphs Abs: 1.3 10*3/uL (ref 0.7–4.0)
MCH: 29 pg (ref 26.0–34.0)
MCHC: 35.1 g/dL (ref 30.0–36.0)
MCV: 82.7 fL (ref 80.0–100.0)
Monocytes Absolute: 0.6 10*3/uL (ref 0.1–1.0)
Monocytes Relative: 8 %
Neutro Abs: 5.3 10*3/uL (ref 1.7–7.7)
Neutrophils Relative %: 71 %
Platelet Count: 142 10*3/uL — ABNORMAL LOW (ref 150–400)
RBC: 3 MIL/uL — ABNORMAL LOW (ref 3.87–5.11)
RDW: 19.5 % — ABNORMAL HIGH (ref 11.5–15.5)
WBC Count: 7.5 10*3/uL (ref 4.0–10.5)
nRBC: 0 % (ref 0.0–0.2)

## 2023-05-02 LAB — SAMPLE TO BLOOD BANK

## 2023-05-02 LAB — FERRITIN: Ferritin: 189 ng/mL (ref 11–307)

## 2023-05-02 MED ORDER — SODIUM CHLORIDE 0.9% FLUSH
10.0000 mL | Freq: Once | INTRAVENOUS | Status: AC
Start: 2023-05-02 — End: 2023-05-02
  Administered 2023-05-02: 10 mL

## 2023-05-02 MED ORDER — POTASSIUM CHLORIDE CRYS ER 20 MEQ PO TBCR: 20.0000 meq | EXTENDED_RELEASE_TABLET | Freq: Every day | ORAL | 0 refills | Status: DC

## 2023-05-02 MED ORDER — FUROSEMIDE 20 MG PO TABS: 20.0000 mg | ORAL_TABLET | Freq: Every day | ORAL | 0 refills | Status: DC | PRN

## 2023-05-02 NOTE — Progress Notes (Signed)
 Patient seen by Dr. America Brown are within treatment parameters.  Labs reviewed: and are within treatment parameters.  Per physician team, patient is ready for treatment and there are NO modifications to the treatment plan.  Per Dr. Mosetta Putt, "OK to tx using ECHO from 10/24".

## 2023-05-02 NOTE — Progress Notes (Signed)
 Per verbal order from Dr. Mosetta Putt w/readback, order home oxygen 2L via [place] continuously and a wheelchair for home use.  Per Dr. Mosetta Putt, request that home oxygen be delivered today or tomorrow to the pt's home.  Order placed with [employer] and [employer] sent to [employer].  Awaiting response from [employer].  Will f/u on 05/03/2023 if no response from [employer].

## 2023-05-02 NOTE — Assessment & Plan Note (Signed)
 [id number] stage IV, grade 2-3, ER-/PR- HER2+, with nodal and pulmonary metastases -She initially self palpated a left breast mass around 06/2020. Work-up showed 2 masses in the left breast UOQ and 3 abnormal lymph nodes. 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 -PET scan 08/28/20 showed: hypermetabolism to left breast and axilla, retropectoral and supraclavicular adenopathy, and several 3-6 mm pulmonary nodules. -She started first-line Kadcyla  on 09/11/20. She tolerates well with some fatigue, dose decreased with C4.  -She has had good clinical response, breast mass was no longer palpable on physical exam 06/09/21.  -last PET scan in 01/2022 showed two small lung nodule (5-61mm), slightly bigger since last scan, no other hypermetabolic lesions.   -Her restaging CT scan from June 17, 2022 showed multiple enlarging lung nodules, largest measuring 1 cm, previously 0.6 cm.  This is highly concerning for pulmonary metastasis with slight disease progression. No other new lesions -she tried tucatinib  150mg  twice daily but did not tolerate with excessive fatigue and drowsiness -she restarted Tucatinib  at 50mg  bid last week , she is tolerating well -I increased Tucatinib  to 100mg  bid on 5/29  -She did not tolerate well, with more fatigue, and unstable gait.  She also developed worsening renal function, I stopped Tucatinib  on 08/15/2022 -She is clinically stable, renal function has returned to her baseline, but no improvement in her fatigue and gait.  Will continue holding tucatinib  now and continue Kadcyla   -repeated CT from 10/03/2022 showed mild progression in lung mets (comparing to three previous scans), but overall low tumor burden and no additional new mets. So we decided to continue Kadcyla  and monitor her disease closely  -She again had a multiple falls lately, and more fatigued.  We discussed a chemo break.  Chemo held after treatment on 10/05/2022 -due to disease progression, treatment changed  to [place]  on  01/31/23

## 2023-05-02 NOTE — Progress Notes (Signed)
 [facility]   Telephone:([phone] Fax:([phone]   Clinic Follow up Note   Patient Care Team: [name], MD as PCP - General (Internal Medicine) Malachy Mood, MD as Consulting Physician (Medical Oncology)  Date of Service:  05/02/2023  CHIEF COMPLAINT: f/u of metastatic breast cancer  CURRENT THERAPY:  Enhertu  Oncology History   Malignant neoplasm of upper-outer quadrant of female breast (HCC) cT2N3M1 stage IV, grade 2-3, ER-/PR- HER2+, with nodal and pulmonary metastases -She initially self palpated a left breast mass around 06/2020. Work-up showed 2 masses in the left breast UOQ and 3 abnormal lymph nodes. 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 -PET scan 08/28/20 showed: hypermetabolism to left breast and axilla, retropectoral and supraclavicular adenopathy, and several 3-6 mm pulmonary nodules. -She started first-line Kadcyla on 09/11/20. She tolerates well with some fatigue, dose decreased with C4.  -She has had good clinical response, breast mass was no longer palpable on physical exam 06/09/21.  -last PET scan in 01/2022 showed two small lung nodule (5-67mm), slightly bigger since last scan, no other hypermetabolic lesions.   -Her restaging CT scan from June 17, 2022 showed multiple enlarging lung nodules, largest measuring 1 cm, previously 0.6 cm.  This is highly concerning for pulmonary metastasis with slight disease progression. No other new lesions -she tried tucatinib 150mg  twice daily but did not tolerate with excessive fatigue and drowsiness -she restarted Tucatinib at 50mg  bid last week , she is tolerating well -I increased Tucatinib to 100mg  bid on 5/29  -She did not tolerate well, with more fatigue, and unstable gait.  She also developed worsening renal function, I stopped Tucatinib on 08/15/2022 -She is clinically stable, renal function has returned to her baseline, but no improvement in her fatigue and gait.  Will continue holding  tucatinib now and continue Kadcyla  -repeated CT from 10/03/2022 showed mild progression in lung mets (comparing to three previous scans), but overall low tumor burden and no additional new mets. So we decided to continue Kadcyla and monitor her disease closely  -She again had a multiple falls lately, and more fatigued.  We discussed a chemo break.  Chemo held after treatment on 10/05/2022 -due to disease progression, treatment changed to Enhertu on  01/31/23    Assessment and Plan    Metastatic Breast Cancer Metastatic breast cancer with metastasis to the lungs, lymph nodes (including retroperitoneal lymph nodes), and armpits. She has completed four cycles of INHER2, the last on February 4th, 2025. Significant side effects include fluid retention, generalized swelling, and muscle weakness, likely exacerbated by inappropriate dexamethasone use. Chemotherapy is on hold due to these complications. Discussed that the cancer is not curable, focusing on symptom management and quality of life. Consideration for hospice care if current treatment is not tolerated. - Hold chemotherapy - Order whole body scan before next visit to assess cancer status  Fluid Overload Generalized swelling in the legs, face, and possibly lungs, likely due to fluid overload. Non-compliance with furosemide (Lasix) and inappropriate dexamethasone use may have contributed. Emphasized the importance of compliance with diuretic therapy. - Prescribe furosemide (Lasix) for 5 days, then as needed - Prescribe potassium chloride to counteract potential hypokalemia from furosemide - Monitor fluid status and adjust diuretic therapy as needed  Wheezing and Dyspnea Intermittent wheezing and dyspnea, possibly related to fluid overload and metastatic lung disease. Using an inhaler with some relief. Oxygen saturation is 96% at rest; further assessment needed to determine if oxygen therapy is  required. - Perform oxygen saturation test during  ambulation - Consider referral to the emergency room if oxygen levels drop significantly during ambulation  Medication Management Multiple medications, some unnecessary or taken incorrectly, leading to complications such as fluid retention and muscle weakness. Discussed the importance of proper medication management and using a medication tray for better organization and compliance. - Review and simplify medication list - Ensure proper use of current medications - Provide a medication tray for better organization and compliance  Plan -Cancel chemotherapy today due to her dyspnea, fatigue and diffuse edema.   -Patient desaturated to 85% when she walked in office for 3-4 mins, she does not want go to hospital.  I ordered a stat VQ scan to rule out PE. -I called in Lasix 20 mg daily for her fluid overload -CT chest, abdomen pelvis without contrast in the next few weeks for restaging. -Virtual visit next week, office visit in 2 to 3 weeks after CT. -Patient will monitor her blood pressure and pulse ox at home -Due to patient's symptoms especially hypoxia and dyspnea, her mobility limitation cannot be sufficiently resolved by the use of a cane, crutch or walker.  I we will order a wheelchair for her to use at home.  She has sufficient physical and mental capacity after using the wheelchair, and her family member can provide assistance with wheelchair.     SUMMARY OF ONCOLOGIC HISTORY: Oncology History Overview Note  Cancer Staging Malignant neoplasm of upper-outer quadrant of female breast (HCC) Staging form: Breast, AJCC 8th Edition - Clinical stage from 07/31/2020: Stage IV (cT2, cN3, cM1, G2, ER-, PR-, HER2+) - Signed by Pollyann Samples, NP on 08/31/2020 Stage prefix: Initial diagnosis Nuclear grade: G2 Histologic grading system: 3 grade system    Malignant neoplasm of upper-outer quadrant of female breast (HCC)  07/24/2020 Breast US   On physical exam, a very firm mass is identified at  the 1:30 position of the LEFT breast 5 cm from the nipple.   IMPRESSION: 1. Highly suspicious 4.3 cm mass at the 1:30 position of the LEFT breast and highly suspicious 2.7 cm mass at the 2 o'clock position of the LEFT breast, with the 2 masses encompassing an area measuring at least 6.1 cm. 2. 3 abnormal LEFT axillary lymph nodes with cortical thickening, tissue sampling of 1 of these lymph nodes recommended. 3. Anterior LEFT breast skin thickening nonspecific but may reflect dermal involvement. 4. No mammographic evidence of RIGHT breast malignancy.   07/31/2020 Initial Biopsy   Diagnosis 1. Breast, left, needle core biopsy, 1 :30 pm, 5cmfn - INVASIVE DUCTAL CARCINOMA, SEE COMMENT. 2. Breast, left, needle core biopsy, 2 o'clock, 8cmfn - INVASIVE DUCTAL CARCINOMA, SEE COMMENT. - DUCTAL CARCINOMA IN SITU. 3. Lymph node, needle/core biopsy, left axillary - METASTATIC CARCINOMA IN A LYMPH NODE. Microscopic Comment 1. and 3. The carcinoma in both parts has a similar morphology and appears grade 2-3. The DCIS in part 2 is high-grade with necrosis. The carcinoma measures 15 mm (part 1) and 14 mm (part 2) in greatest linear extent  2. PROGNOSTIC INDICATORS Results: IMMUNOHISTOCHEMICAL AND MORPHOMETRIC ANALYSIS PERFORMED MANUALLY The tumor cells are POSITIVE for Her2 (3+). Estrogen Receptor: 0%, NEGATIVE Progesterone Receptor: 0%, NEGATIVE Proliferation Marker Ki67: 20%    07/31/2020 Cancer Staging   Staging form: Breast, AJCC 8th Edition - Clinical stage from 07/31/2020: Stage IV (cT2, cN3, cM1, G2, ER-, PR-, HER2+) - Signed by Pollyann Samples, NP on 08/31/2020 Stage prefix: Initial diagnosis Nuclear grade: G2  Histologic grading system: 3 grade system   08/13/2020 Initial Diagnosis   Malignant neoplasm of upper-outer quadrant of female breast (HCC)   08/28/2020 PET scan   IMPRESSION: Signs of LEFT breast cancer with LEFT axillary and retropectoral adenopathy as well as LEFT  supraclavicular adenopathy.   Signs of pulmonary metastatic disease.   Mild asymmetric uptake in the LEFT as compared to the RIGHT adrenal gland. Equivocal but suspicious, attention on follow-up.   Aortic atherosclerosis.   Cystic changes and potential bronchiectasis in the RIGHT lung base likely post infectious or inflammatory. Correlate with any respiratory symptoms. Comparison with prior imaging may be helpful with attention on follow-up.   08/28/2020 Imaging   BRAIN MRI IMPRESSION: 1. Subacute lacunar infarct of the left basal ganglia with no malignant hemorrhagic transformation or mass effect. Underlying chronic bilateral basal ganglia ischemia. 2. No metastatic disease or other acute intracranial abnormality identified.   08/28/2020 Imaging   Breast MRI   IMPRESSION: 1. 8.1 x 8.1 x 5.1 cm area of biopsy-proven malignancy in the central, upper outer and lower outer quadrants of the left breast. 2. Diffuse anterior left breast skin thickening without abnormal enhancement. This may represent thickening due to lymphedema associated with lymphatic obstruction. 3. Metastatic level 1 and level 2 left axillary adenopathy. 4. No evidence of malignancy on the right.   09/11/2020 - 11/03/2021 Chemotherapy   Patient is on Treatment Plan : BREAST ADO-Trastuzumab Emtansine (Kadcyla) q21d     09/11/2020 - 01/04/2023 Chemotherapy   Patient is on Treatment Plan : BREAST ADO-Trastuzumab Emtansine (Kadcyla) q21d     11/30/2020 Imaging   CT Chest w/o contrast  IMPRESSION: 1. Interval response to therapy. The left breast mass has mildly decreased in size in the interval. 2. Decrease in size of left axillary and left retropectoral lymph nodes. 3. Small right lung nodules stable to improved. 4. Coronary artery calcifications noted. 5. Aortic Atherosclerosis (ICD10-I70.0).   03/04/2021 PET scan   IMPRESSION: 1. Overall marked improvement compared to the prior PET-CT, with resolution of  hypermetabolic activity and pathologic enlargement of the previously involved lymph nodes; lack of hypermetabolic activity and nonvisualization of prior right-sided lung nodules, and substantial reduction in size and activity in the left breast primary. 2. New ground-glass opacity medially in the right lower lobe with low-grade metabolic activity, highly likely to be inflammatory (alveolitis) given the morphology and appearance. 3. Other imaging findings of potential clinical significance: Aortic Atherosclerosis (ICD10-I70.0). Coronary atherosclerosis. Mitral valve calcifications. Punctate nonobstructive left renal calculi. Lower lumbar impingement due to chronic spondylosis and degenerative disc disease. Remote left basal ganglia infarct.   06/07/2021 Imaging   EXAM: CT CHEST, ABDOMEN AND PELVIS WITHOUT CONTRAST  IMPRESSION: 1. Scattered tiny bilateral pulmonary nodules are similar to the prior exams, favored to be benign. 2. Otherwise, no evidence of metastatic disease within the chest, abdomen, or pelvis. 3. Right lower lobe ground-glass nodule has resolved since the prior PET 4.  Possible constipation. 5. Coronary artery atherosclerosis. Aortic Atherosclerosis (ICD10-I70.0). Emphysema (ICD10-J43.9). 6. Left nephrolithiasis.   06/15/2022 Imaging    IMPRESSION: 1. Mild increase in size of bilateral pulmonary nodules compatible with progression of metastatic disease. 2. No evidence for metastatic disease within the abdomen or pelvis. 3. Large volume of desiccated stool identified within the rectum. Correlate for any clinical signs or symptoms of constipation. 4. Nonobstructing left renal calculus. 5. Aortic Atherosclerosis (ICD10-I70.0).     10/03/2022 Imaging    IMPRESSION: 1. Mild but definite progression of pulmonary metastasis. 2.  No extrathoracic metastatic disease identified. 3. Incidental findings, including: Left nephrolithiasis. Coronary artery atherosclerosis. Aortic  Atherosclerosis (ICD10-I70.0). Possible constipation.   01/31/2023 -  Chemotherapy   Patient is on Treatment Plan : BREAST METASTATIC Fam-Trastuzumab Deruxtecan-nxki (Enhertu) (5.4) q21d        Discussed the use of AI scribe software for clinical note transcription with the patient, who gave verbal consent to proceed.  History of Present Illness   The patient is an 83 year old female with a known history of metastatic breast cancer, presenting with increased swelling in her legs and face, increased sleepiness, and difficulty breathing. She reports falling multiple times recently, with one incident resulting in injuries to her elbows. The patient's son notes that she has been sleeping excessively and has difficulty completing tasks due to fatigue and swelling.  The patient's medication management has been problematic. She was taking dexamethasone, a steroid prescribed for post-chemotherapy care, for eight days straight instead of the recommended three days. This excessive intake may have contributed to her current symptoms of swelling and weakness. The patient also reports taking metformin for diabetes, atorvastatin for cholesterol, and an iron supplement. She was prescribed Lasix (furosemide) for fluid retention but only took it once due to an incident of incontinence.  The patient's son expresses concern about her decreased appetite and her habit of combining different foods, which often results in stomach aches. Despite these issues, the patient's son reports that she is stable and wishes to continue her current cancer treatment once her medication management is improved.         All other systems were reviewed with the patient and are negative.  MEDICAL HISTORY:  Past Medical History:  Diagnosis Date   Diabetes mellitus without complication (HCC)    Hyperlipidemia    Hypertension     SURGICAL HISTORY: Past Surgical History:  Procedure Laterality Date   FOOT FRACTURE SURGERY Right     IR IMAGING GUIDED PORT INSERTION  09/22/2020   IR RADIOLOGIST EVAL & MGMT  09/25/2020    I have reviewed the social history and family history with the patient and they are unchanged from previous note.  ALLERGIES:  has no known allergies.  MEDICATIONS:  Current Outpatient Medications  Medication Sig Dispense Refill   aspirin EC 325 MG tablet Take 1 tablet (325 mg total) by mouth daily. 60 tablet 3   atorvastatin (LIPITOR) 20 MG tablet Take 1 tablet by mouth once daily 60 tablet 0   dexamethasone (DECADRON) 4 MG tablet Take 2 tablets (8 mg) by mouth daily for 3 days starting the day after chemotherapy. Take with food. 30 tablet 1   Ferrous Sulfate (IRON) 325 (65 Fe) MG TABS Take 1 tablet by mouth once daily with breakfast 30 tablet 0   furosemide (LASIX) 20 MG tablet Take 1 tablet (20 mg total) by mouth daily as needed. 30 tablet 0   lidocaine-prilocaine (EMLA) cream Apply 1 Application topically as needed. 30 g 2   lidocaine-prilocaine (EMLA) cream Apply to affected area once 30 g 3   metFORMIN (GLUCOPHAGE) 500 MG tablet Take 1 tablet (500 mg total) by mouth 2 (two) times daily with a meal. 60 tablet 1   mupirocin ointment (BACTROBAN) 2 % Apply small amount to effected areas twice daily for 7 days 66 g 0   olmesartan (BENICAR) 40 MG tablet Take 1 tablet (40 mg total) by mouth daily. 30 tablet 0   ondansetron (ZOFRAN) 8 MG tablet Take 1 tablet (8 mg total) by mouth  every 8 (eight) hours as needed for nausea or vomiting. Start on the third day after chemotherapy. (Patient not taking: Reported on 04/11/2023) 30 tablet 1   potassium chloride SA (KLOR-CON M) 20 MEQ tablet Take 1 tablet (20 mEq total) by mouth daily. ONLY take when she is on lasix 30 tablet 0   prochlorperazine (COMPAZINE) 10 MG tablet Take 1 tablet (10 mg total) by mouth every 6 (six) hours as needed for nausea or vomiting. (Patient not taking: Reported on 04/11/2023) 30 tablet 1   No current facility-administered medications for  this visit.    PHYSICAL EXAMINATION: ECOG PERFORMANCE STATUS: 3 - Symptomatic, >50% confined to bed  Vitals:   05/02/23 1352  BP: (!) 181/78  Pulse: 92  Resp: 17  Temp: 98.2 F (36.8 C)  SpO2: 96%   Wt Readings from Last 3 Encounters:  05/02/23 158 lb 8 oz (71.9 kg)  04/24/23 172 lb (78 kg)  04/11/23 156 lb (70.8 kg)     GENERAL:alert, no distress and comfortable SKIN: skin color, texture, turgor are normal, no rashes or significant lesions EYES: normal, Conjunctiva are pink and non-injected, sclera clear NECK: supple, thyroid normal size, non-tender, without nodularity LYMPH:  no palpable lymphadenopathy in the cervical, axillary  LUNGS: clear to auscultation and percussion with normal breathing effort, positive bilateral Rales at the bottom of lungs HEART: regular rate & rhythm and no murmurs and no lower extremity edema ABDOMEN:abdomen soft, non-tender and normal bowel sounds Musculoskeletal:no cyanosis of digits and no clubbing, bilateral pitting edema up to knees, mild facial edema NEURO: alert & oriented x 3 with fluent speech, no focal motor/sensory deficits   LABORATORY DATA:  I have reviewed the data as listed    Latest Ref Rng & Units 05/02/2023    1:21 PM 04/24/2023    7:32 PM 04/11/2023   12:18 PM  CBC  WBC 4.0 - 10.5 K/uL 7.5  9.6  8.8   Hemoglobin 12.0 - 15.0 g/dL 8.7  9.3  40.9   Hematocrit 36.0 - 46.0 % 24.8  28.6  29.7   Platelets 150 - 400 K/uL 142  95  142         Latest Ref Rng & Units 05/02/2023    1:21 PM 04/24/2023    7:32 PM 04/11/2023   12:18 PM  CMP  Glucose 70 - 99 mg/dL 811  914  782   BUN 8 - 23 mg/dL 14  28  22    Creatinine 0.44 - 1.00 mg/dL 9.56  2.13  0.86   Sodium 135 - 145 mmol/L 141  139  144   Potassium 3.5 - 5.1 mmol/L 3.4  4.3  3.7   Chloride 98 - 111 mmol/L 111  112  115   CO2 22 - 32 mmol/L 23  21  24    Calcium 8.9 - 10.3 mg/dL 8.5  8.8  9.6   Total Protein 6.5 - 8.1 g/dL 4.8   5.8   Total Bilirubin 0.0 - 1.2 mg/dL 1.6    1.2   Alkaline Phos 38 - 126 U/L 135   97   AST 15 - 41 U/L 62   44   ALT 0 - 44 U/L 60   57       RADIOGRAPHIC STUDIES: I have personally reviewed the radiological images as listed and agreed with the findings in the report. No results found.    No orders of the defined types were placed in this encounter.  All questions were  answered. The patient knows to call the clinic with any problems, questions or concerns. No barriers to learning was detected. The total time spent in the appointment was 55 minutes.     Malachy Mood, MD 05/02/2023

## 2023-05-03 ENCOUNTER — Ambulatory Visit (HOSPITAL_COMMUNITY)
Admission: RE | Admit: 2023-05-03 | Discharge: 2023-05-03 | Disposition: A | Discharge status: Home / Self Care | Payer: Medicare HMO | Source: Ambulatory Visit | Attending: Hematology | Admitting: Hematology

## 2023-05-03 ENCOUNTER — Other Ambulatory Visit: Payer: Self-pay

## 2023-05-03 ENCOUNTER — Telehealth: Payer: Self-pay | Admitting: Hematology

## 2023-05-03 ENCOUNTER — Other Ambulatory Visit: Payer: Self-pay | Admitting: Hematology

## 2023-05-03 DIAGNOSIS — Z171 Estrogen receptor negative status [ER-]: Secondary | ICD-10-CM | POA: Diagnosis not present

## 2023-05-03 DIAGNOSIS — I081 Rheumatic disorders of both mitral and tricuspid valves: Secondary | ICD-10-CM | POA: Insufficient documentation

## 2023-05-03 DIAGNOSIS — R0602 Shortness of breath: Secondary | ICD-10-CM | POA: Diagnosis not present

## 2023-05-03 DIAGNOSIS — Z0189 Encounter for other specified special examinations: Secondary | ICD-10-CM | POA: Diagnosis not present

## 2023-05-03 DIAGNOSIS — C50412 Malignant neoplasm of upper-outer quadrant of left female breast: Secondary | ICD-10-CM | POA: Diagnosis not present

## 2023-05-03 LAB — ECHOCARDIOGRAM COMPLETE
Area-P 1/2: 4.29 cm2
S' Lateral: 3 cm

## 2023-05-03 MED ORDER — TECHNETIUM TO 99M ALBUMIN AGGREGATED
4.0000 | Freq: Once | INTRAVENOUS | Status: AC | PRN
  Administered 2023-05-03: 4 via INTRAVENOUS

## 2023-05-03 NOTE — Telephone Encounter (Signed)
 Patient's daughter is aware of scheduled appointment times/dates as well as future appointments, patient has callback number if needing any appointment changes

## 2023-05-03 NOTE — Progress Notes (Signed)
 Tonya from [place] contacted [place] this morning stating that the patients insurance [employer] PPO is out of network they are only in network with [facility]. Tonya faxed the order over to  [facility] for them to fill it they are in network.

## 2023-05-04 ENCOUNTER — Telehealth: Payer: Self-pay

## 2023-05-04 ENCOUNTER — Other Ambulatory Visit: Payer: Self-pay

## 2023-05-04 ENCOUNTER — Encounter: Payer: Self-pay | Admitting: Hematology

## 2023-05-04 NOTE — Telephone Encounter (Signed)
 Contacted patient's daughter via telephone call, per Dr. Mosetta Putt. let pt's daughter  know her VQ and echo result, no concern for PE. Daughter stated patient's edema has not improved with lasix Let daughter know that per Dr. Mosetta Putt, okay to increase lasix to 2 tabs daily. Reminded daughter of patient's  virtual visit with Dr. Mosetta Putt next week and CT (OK as scheduled). Daughter verbalized understanding.  Daughter expressed concern regarding patient's breathing. Daughter stated patient's breathing has not improved. Daughter denies any other symptoms, states patient is coherent, eating, and drinking well. Let daughter know [facility] should be contacting her soon to get the patient her o2 tank. Let daughter know to take patient to ED if her breathing concerns continue or worsen.  Daughter verbalized understanding.

## 2023-05-04 NOTE — Telephone Encounter (Signed)
 Opened by mistake.

## 2023-05-05 ENCOUNTER — Other Ambulatory Visit: Payer: Self-pay

## 2023-05-05 ENCOUNTER — Telehealth: Payer: Self-pay

## 2023-05-05 NOTE — Telephone Encounter (Signed)
 Reached out to [facility] @[phone] and [phone]. Spoke with several representatives regarding the faxes we received from them requesting Dr. Latanya Maudlin NPI number. Rep unable to inform me if there was anything else needed to move forward with patient's case. Rep unable to update me accordingly as to whether the faxes were received. Rep provided me with the same telephone number I used to contact her at, [phone]. Please note that I personally replied to [facility] via fax on 05/04/23 with the information they requested.  Today is day 3 of the back and forth via fax correspondence.  Today I personally wrote Dr. Latanya Maudlin NPI Number as well as printed her name on all required documents as they requested and faxed all requested documents back to [facility] fax# [phone]. Confirmation received indicating fax was successfully sent. Placed requested docs with fax confirmation sheet in desk draw for office records.

## 2023-05-08 NOTE — Assessment & Plan Note (Deleted)
 [id number] stage IV, grade 2-3, ER-/PR- HER2+, with nodal and pulmonary metastases -She initially self palpated a left breast mass around 06/2020. Work-up showed 2 masses in the left breast UOQ and 3 abnormal lymph nodes. 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 -PET scan 08/28/20 showed: hypermetabolism to left breast and axilla, retropectoral and supraclavicular adenopathy, and several 3-6 mm pulmonary nodules. -She started first-line Kadcyla on 09/11/20. She tolerates well with some fatigue, dose decreased with C4.  -She has had good clinical response, breast mass was no longer palpable on physical exam 06/09/21.  -last PET scan in 01/2022 showed two small lung nodule (5-76mm), slightly bigger since last scan, no other hypermetabolic lesions.   -Her restaging CT scan from June 17, 2022 showed multiple enlarging lung nodules, largest measuring 1 cm, previously 0.6 cm.  This is highly concerning for pulmonary metastasis with slight disease progression. No other new lesions -she tried tucatinib 150mg  twice daily but did not tolerate with excessive fatigue and drowsiness -she restarted Tucatinib at 50mg  bid last week , she is tolerating well -I increased Tucatinib to 100mg  bid on 5/29  -She did not tolerate well, with more fatigue, and unstable gait.  She also developed worsening renal function, I stopped Tucatinib on 08/15/2022 -She is clinically stable, renal function has returned to her baseline, but no improvement in her fatigue and gait.  Will continue holding tucatinib now and continue Kadcyla  -repeated CT from 10/03/2022 showed mild progression in lung mets (comparing to three previous scans), but overall low tumor burden and no additional new mets. So we decided to continue Kadcyla and monitor her disease closely  -She again had a multiple falls lately, and more fatigued.  We discussed a chemo break.  Chemo held after treatment on 10/05/2022 -due to disease progression, treatment changed  to Enhertu on  01/31/23  -due to worsening edema/anasarca and mild hypoxia, I stopped tx on 05/02/2023

## 2023-05-09 ENCOUNTER — Emergency Department (HOSPITAL_COMMUNITY)

## 2023-05-09 ENCOUNTER — Inpatient Hospital Stay (HOSPITAL_COMMUNITY)
Admission: EM | Admit: 2023-05-09 | Discharge: 2023-05-16 | DRG: 205 | Disposition: A | Discharge status: Hospice | Attending: Family Medicine | Admitting: Family Medicine

## 2023-05-09 ENCOUNTER — Other Ambulatory Visit: Payer: Self-pay

## 2023-05-09 ENCOUNTER — Inpatient Hospital Stay: Payer: Medicare HMO | Admitting: Hematology

## 2023-05-09 ENCOUNTER — Encounter (HOSPITAL_COMMUNITY): Payer: Self-pay | Admitting: Emergency Medicine

## 2023-05-09 DIAGNOSIS — E1122 Type 2 diabetes mellitus with diabetic chronic kidney disease: Secondary | ICD-10-CM | POA: Insufficient documentation

## 2023-05-09 DIAGNOSIS — R1310 Dysphagia, unspecified: Secondary | ICD-10-CM | POA: Diagnosis present

## 2023-05-09 DIAGNOSIS — I5033 Acute on chronic diastolic (congestive) heart failure: Secondary | ICD-10-CM | POA: Diagnosis present

## 2023-05-09 DIAGNOSIS — N179 Acute kidney failure, unspecified: Secondary | ICD-10-CM | POA: Diagnosis present

## 2023-05-09 DIAGNOSIS — Z79899 Other long term (current) drug therapy: Secondary | ICD-10-CM

## 2023-05-09 DIAGNOSIS — K625 Hemorrhage of anus and rectum: Secondary | ICD-10-CM | POA: Diagnosis not present

## 2023-05-09 DIAGNOSIS — K5900 Constipation, unspecified: Secondary | ICD-10-CM | POA: Diagnosis present

## 2023-05-09 DIAGNOSIS — B3789 Other sites of candidiasis: Secondary | ICD-10-CM | POA: Diagnosis present

## 2023-05-09 DIAGNOSIS — D696 Thrombocytopenia, unspecified: Secondary | ICD-10-CM | POA: Diagnosis present

## 2023-05-09 DIAGNOSIS — J704 Drug-induced interstitial lung disorders, unspecified: Principal | ICD-10-CM | POA: Diagnosis present

## 2023-05-09 DIAGNOSIS — I679 Cerebrovascular disease, unspecified: Secondary | ICD-10-CM | POA: Diagnosis present

## 2023-05-09 DIAGNOSIS — R17 Unspecified jaundice: Secondary | ICD-10-CM | POA: Diagnosis present

## 2023-05-09 DIAGNOSIS — C50419 Malignant neoplasm of upper-outer quadrant of unspecified female breast: Secondary | ICD-10-CM | POA: Diagnosis present

## 2023-05-09 DIAGNOSIS — N1831 Chronic kidney disease, stage 3a: Secondary | ICD-10-CM | POA: Diagnosis present

## 2023-05-09 DIAGNOSIS — J1282 Pneumonia due to coronavirus disease 2019: Secondary | ICD-10-CM | POA: Diagnosis present

## 2023-05-09 DIAGNOSIS — J9601 Acute respiratory failure with hypoxia: Principal | ICD-10-CM | POA: Diagnosis present

## 2023-05-09 DIAGNOSIS — C3491 Malignant neoplasm of unspecified part of right bronchus or lung: Secondary | ICD-10-CM | POA: Diagnosis present

## 2023-05-09 DIAGNOSIS — E876 Hypokalemia: Secondary | ICD-10-CM | POA: Diagnosis present

## 2023-05-09 DIAGNOSIS — Z803 Family history of malignant neoplasm of breast: Secondary | ICD-10-CM

## 2023-05-09 DIAGNOSIS — Z6826 Body mass index (BMI) 26.0-26.9, adult: Secondary | ICD-10-CM

## 2023-05-09 DIAGNOSIS — K828 Other specified diseases of gallbladder: Secondary | ICD-10-CM | POA: Diagnosis present

## 2023-05-09 DIAGNOSIS — J028 Acute pharyngitis due to other specified organisms: Secondary | ICD-10-CM | POA: Diagnosis present

## 2023-05-09 DIAGNOSIS — F0390 Unspecified dementia without behavioral disturbance: Secondary | ICD-10-CM | POA: Diagnosis present

## 2023-05-09 DIAGNOSIS — C50412 Malignant neoplasm of upper-outer quadrant of left female breast: Secondary | ICD-10-CM | POA: Diagnosis present

## 2023-05-09 DIAGNOSIS — D649 Anemia, unspecified: Secondary | ICD-10-CM | POA: Diagnosis present

## 2023-05-09 DIAGNOSIS — Z87891 Personal history of nicotine dependence: Secondary | ICD-10-CM

## 2023-05-09 DIAGNOSIS — I130 Hypertensive heart and chronic kidney disease with heart failure and stage 1 through stage 4 chronic kidney disease, or unspecified chronic kidney disease: Secondary | ICD-10-CM | POA: Diagnosis present

## 2023-05-09 DIAGNOSIS — E43 Unspecified severe protein-calorie malnutrition: Secondary | ICD-10-CM | POA: Diagnosis present

## 2023-05-09 DIAGNOSIS — Z7984 Long term (current) use of oral hypoglycemic drugs: Secondary | ICD-10-CM

## 2023-05-09 DIAGNOSIS — Z171 Estrogen receptor negative status [ER-]: Secondary | ICD-10-CM

## 2023-05-09 DIAGNOSIS — Z515 Encounter for palliative care: Secondary | ICD-10-CM

## 2023-05-09 DIAGNOSIS — Z8673 Personal history of transient ischemic attack (TIA), and cerebral infarction without residual deficits: Secondary | ICD-10-CM

## 2023-05-09 DIAGNOSIS — D62 Acute posthemorrhagic anemia: Secondary | ICD-10-CM | POA: Diagnosis present

## 2023-05-09 DIAGNOSIS — K921 Melena: Secondary | ICD-10-CM | POA: Diagnosis present

## 2023-05-09 DIAGNOSIS — E785 Hyperlipidemia, unspecified: Secondary | ICD-10-CM | POA: Diagnosis present

## 2023-05-09 DIAGNOSIS — B370 Candidal stomatitis: Secondary | ICD-10-CM | POA: Insufficient documentation

## 2023-05-09 DIAGNOSIS — I10 Essential (primary) hypertension: Secondary | ICD-10-CM | POA: Insufficient documentation

## 2023-05-09 DIAGNOSIS — Z7982 Long term (current) use of aspirin: Secondary | ICD-10-CM

## 2023-05-09 DIAGNOSIS — C779 Secondary and unspecified malignant neoplasm of lymph node, unspecified: Secondary | ICD-10-CM | POA: Diagnosis present

## 2023-05-09 DIAGNOSIS — U071 COVID-19: Principal | ICD-10-CM | POA: Diagnosis present

## 2023-05-09 DIAGNOSIS — J984 Other disorders of lung: Secondary | ICD-10-CM | POA: Diagnosis present

## 2023-05-09 DIAGNOSIS — R791 Abnormal coagulation profile: Secondary | ICD-10-CM | POA: Diagnosis present

## 2023-05-09 DIAGNOSIS — R633 Feeding difficulties, unspecified: Secondary | ICD-10-CM | POA: Diagnosis present

## 2023-05-09 DIAGNOSIS — K30 Functional dyspepsia: Secondary | ICD-10-CM | POA: Diagnosis present

## 2023-05-09 DIAGNOSIS — Z853 Personal history of malignant neoplasm of breast: Secondary | ICD-10-CM

## 2023-05-09 NOTE — ED Provider Notes (Signed)
 [place] EMERGENCY DEPARTMENT AT [facility] Provider Note   CSN: [id number] Arrival date & time: 05/09/23  2253     History {Add pertinent medical, surgical, social history, OB history to HPI:1} Chief Complaint  Patient presents with   Rectal Bleeding    Christy [name] is a 83 y.o. female.  Patient with a history of hypertension, breast cancer, diabetes, hyperlipidemia presents from home with dark stools onset this evening.  She is a poor historian.  States she had 1 episode of dark stools today as well as some bright red blood per rectum.  She does have breast cancer but denies any blood thinner use.  She has had cough congestion and shortness of breath with swelling her legs ongoing for several weeks.  Does feel short of breath at this time with some coughing.  It wears 4 L at baseline.  Denies chest pain or fever.  Denies abdominal pain.  Denies vomiting.  Has chronic dysphagia and difficulty eating but no vomiting.  No pain with urination or blood in the urine.  The history is provided by the patient and the EMS personnel. The history is limited by the condition of the patient.  Rectal Bleeding Associated symptoms: no dizziness, no fever and no vomiting        Home Medications Prior to Admission medications   Medication Sig Start Date End Date Taking? Authorizing Provider  aspirin EC 325 MG tablet Take 1 tablet (325 mg total) by mouth daily. 01/04/23   Pollyann Samples, NP  atorvastatin (LIPITOR) 20 MG tablet Take 1 tablet by mouth once daily 01/31/23   Vaslow, Georgeanna Lea, MD  dexamethasone (DECADRON) 4 MG tablet Take 2 tablets (8 mg) by mouth daily for 3 days starting the day after chemotherapy. Take with food. 01/04/23   Malachy Mood, MD  Ferrous Sulfate (IRON) 325 (65 Fe) MG TABS Take 1 tablet by mouth once daily with breakfast 04/03/23   Georga Kaufmann T, PA-C  furosemide (LASIX) 20 MG tablet Take 1 tablet (20 mg total) by mouth daily as needed. 05/02/23 06/01/23   Malachy Mood, MD  lidocaine-prilocaine (EMLA) cream Apply 1 Application topically as needed. 10/26/22   Malachy Mood, MD  lidocaine-prilocaine (EMLA) cream Apply to affected area once 01/04/23   Malachy Mood, MD  metFORMIN (GLUCOPHAGE) 500 MG tablet Take 1 tablet (500 mg total) by mouth 2 (two) times daily with a meal. 09/27/19   Janace Aris, FNP  mupirocin ointment (BACTROBAN) 2 % Apply small amount to effected areas twice daily for 7 days 04/11/23   Carlean Jews, NP  olmesartan (BENICAR) 40 MG tablet Take 1 tablet (40 mg total) by mouth daily. 01/04/23   Pollyann Samples, NP  ondansetron (ZOFRAN) 8 MG tablet Take 1 tablet (8 mg total) by mouth every 8 (eight) hours as needed for nausea or vomiting. Start on the third day after chemotherapy. Patient not taking: Reported on 04/11/2023 01/04/23   Malachy Mood, MD  potassium chloride SA (KLOR-CON M) 20 MEQ tablet Take 1 tablet (20 mEq total) by mouth daily. ONLY take when she is on lasix 05/02/23   Malachy Mood, MD  prochlorperazine (COMPAZINE) 10 MG tablet Take 1 tablet (10 mg total) by mouth every 6 (six) hours as needed for nausea or vomiting. Patient not taking: Reported on 04/11/2023 01/04/23   Malachy Mood, MD      Allergies    Patient has no known allergies.    Review of Systems  Review of Systems  Constitutional:  Positive for activity change, appetite change and fatigue. Negative for fever.  HENT:  Negative for congestion and rhinorrhea.   Respiratory:  Positive for cough and shortness of breath. Negative for chest tightness.   Cardiovascular:  Positive for leg swelling.  Gastrointestinal:  Positive for blood in stool and hematochezia. Negative for nausea and vomiting.  Genitourinary:  Negative for dysuria and hematuria.  Musculoskeletal:  Negative for arthralgias and myalgias.  Neurological:  Positive for weakness. Negative for dizziness and headaches.   all other systems are negative except as noted in the HPI and PMH.    Physical Exam Updated  Vital Signs BP (!) 151/83   Pulse 80   Temp (!) 97.4 F (36.3 C) (Oral)   Resp 16   Ht 5\' 4"  (1.626 m)   Wt 71.9 kg   SpO2 98%   BMI 27.20 kg/m  Physical Exam Vitals and nursing note reviewed.  Constitutional:      General: She is not in acute distress.    Appearance: She is well-developed.     Comments: Chronically ill-appearing  HENT:     Head: Normocephalic and atraumatic.     Mouth/Throat:     Pharynx: No oropharyngeal exudate.  Eyes:     Conjunctiva/sclera: Conjunctivae normal.     Pupils: Pupils are equal, round, and reactive to light.  Neck:     Comments: No meningismus. Cardiovascular:     Rate and Rhythm: Normal rate and regular rhythm.     Heart sounds: Normal heart sounds. No murmur heard. Pulmonary:     Effort: Pulmonary effort is normal. No respiratory distress.     Breath sounds: Normal breath sounds.  Abdominal:     Palpations: Abdomen is soft.     Tenderness: There is no abdominal tenderness. There is no guarding or rebound.  Genitourinary:    Comments: Small external hemorrhoids.  Gross blood on examining finger. Musculoskeletal:        General: No tenderness. Normal range of motion.     Cervical back: Normal range of motion and neck supple.     Right lower leg: Edema present.     Left lower leg: Edema present.     Comments: +2 edema to knees bilateral  Skin:    General: Skin is warm.  Neurological:     Mental Status: She is alert and oriented to person, place, and time.     Cranial Nerves: No cranial nerve deficit.     Motor: No abnormal muscle tone.     Coordination: Coordination normal.     Comments:  5/5 strength throughout. CN 2-12 intact.Equal grip strength.   Psychiatric:        Behavior: Behavior normal.     ED Results / Procedures / Treatments   Labs (all labs ordered are listed, but only abnormal results are displayed) Labs Reviewed  RESP PANEL BY RT-PCR (RSV, FLU A&B, COVID)  [id number]  CBC WITH DIFFERENTIAL/PLATELET   COMPREHENSIVE METABOLIC PANEL  URINALYSIS, ROUTINE W REFLEX MICROSCOPIC  PROTIME-INR  LIPASE, BLOOD  BRAIN NATRIURETIC PEPTIDE  POC OCCULT BLOOD, ED  TYPE AND SCREEN  TROPONIN I (HIGH SENSITIVITY)    EKG None  Radiology No results found.  Procedures Procedures  {Document cardiac monitor, telemetry assessment procedure when appropriate:1}  Medications Ordered in ED Medications - No data to display  ED Course/ Medical Decision Making/ A&P   {   Click here for ABCD2, HEART and other calculatorsREFRESH Note before signing :  1}                              Medical Decision Making Amount and/or Complexity of Data Reviewed Labs: ordered. Decision-making details documented in ED Course. Radiology: ordered and independent interpretation performed. Decision-making details documented in ED Course. ECG/medicine tests: ordered and independent interpretation performed. Decision-making details documented in ED Course.   Rectal bleeding since this evening.  2 weeks of shortness of breath, cough and leg swelling.  Stable vitals.  No hypoxia or increased work of breathing.  Abdomen soft peritoneal signs.   gross blood on rectal exam with small external hemorrhoid.  Does not take any blood thinners.  {Document critical care time when appropriate:1} {Document review of labs and clinical decision tools ie heart score, Chads2Vasc2 etc:1}  {Document your independent review of radiology images, and any outside records:1} {Document your discussion with family members, caretakers, and with consultants:1} {Document social determinants of health affecting pt's care:1} {Document your decision making why or why not admission, treatments were needed:1} Final Clinical Impression(s) / ED Diagnoses Final diagnoses:  None    Rx / DC Orders ED Discharge Orders     None

## 2023-05-09 NOTE — ED Triage Notes (Addendum)
 Pt presents to the ED via [facility] with complaints of rectal bleeding with dark colored stools. Hx of stage 2 breast cancer. Endorses some weakness and peripheral edema x 2 weeks. A&Ox4 at this time. Denies CP or SOB.    Wears 4L [place] at baseline.

## 2023-05-10 ENCOUNTER — Emergency Department (HOSPITAL_COMMUNITY)

## 2023-05-10 ENCOUNTER — Other Ambulatory Visit: Payer: Self-pay

## 2023-05-10 ENCOUNTER — Ambulatory Visit (HOSPITAL_COMMUNITY): Admission: RE | Admit: 2023-05-10 | Payer: Medicare HMO | Source: Ambulatory Visit

## 2023-05-10 DIAGNOSIS — U071 COVID-19: Secondary | ICD-10-CM | POA: Diagnosis present

## 2023-05-10 DIAGNOSIS — C50412 Malignant neoplasm of upper-outer quadrant of left female breast: Secondary | ICD-10-CM | POA: Diagnosis present

## 2023-05-10 DIAGNOSIS — K625 Hemorrhage of anus and rectum: Secondary | ICD-10-CM | POA: Diagnosis present

## 2023-05-10 DIAGNOSIS — E43 Unspecified severe protein-calorie malnutrition: Secondary | ICD-10-CM | POA: Diagnosis present

## 2023-05-10 DIAGNOSIS — K5900 Constipation, unspecified: Secondary | ICD-10-CM | POA: Diagnosis present

## 2023-05-10 DIAGNOSIS — N179 Acute kidney failure, unspecified: Secondary | ICD-10-CM | POA: Diagnosis present

## 2023-05-10 DIAGNOSIS — F0390 Unspecified dementia without behavioral disturbance: Secondary | ICD-10-CM | POA: Diagnosis present

## 2023-05-10 DIAGNOSIS — J1282 Pneumonia due to coronavirus disease 2019: Secondary | ICD-10-CM | POA: Diagnosis present

## 2023-05-10 DIAGNOSIS — D696 Thrombocytopenia, unspecified: Secondary | ICD-10-CM | POA: Diagnosis present

## 2023-05-10 DIAGNOSIS — J9601 Acute respiratory failure with hypoxia: Principal | ICD-10-CM

## 2023-05-10 DIAGNOSIS — J704 Drug-induced interstitial lung disorders, unspecified: Secondary | ICD-10-CM | POA: Diagnosis present

## 2023-05-10 DIAGNOSIS — R17 Unspecified jaundice: Secondary | ICD-10-CM | POA: Diagnosis present

## 2023-05-10 DIAGNOSIS — E876 Hypokalemia: Secondary | ICD-10-CM | POA: Diagnosis present

## 2023-05-10 DIAGNOSIS — I5033 Acute on chronic diastolic (congestive) heart failure: Secondary | ICD-10-CM | POA: Diagnosis present

## 2023-05-10 DIAGNOSIS — E1122 Type 2 diabetes mellitus with diabetic chronic kidney disease: Secondary | ICD-10-CM | POA: Diagnosis present

## 2023-05-10 DIAGNOSIS — R791 Abnormal coagulation profile: Secondary | ICD-10-CM | POA: Insufficient documentation

## 2023-05-10 DIAGNOSIS — B3789 Other sites of candidiasis: Secondary | ICD-10-CM | POA: Diagnosis present

## 2023-05-10 DIAGNOSIS — Z515 Encounter for palliative care: Secondary | ICD-10-CM | POA: Diagnosis not present

## 2023-05-10 DIAGNOSIS — D62 Acute posthemorrhagic anemia: Secondary | ICD-10-CM | POA: Diagnosis present

## 2023-05-10 DIAGNOSIS — D649 Anemia, unspecified: Secondary | ICD-10-CM | POA: Diagnosis present

## 2023-05-10 DIAGNOSIS — E785 Hyperlipidemia, unspecified: Secondary | ICD-10-CM | POA: Diagnosis present

## 2023-05-10 DIAGNOSIS — Z7189 Other specified counseling: Secondary | ICD-10-CM | POA: Diagnosis not present

## 2023-05-10 DIAGNOSIS — I130 Hypertensive heart and chronic kidney disease with heart failure and stage 1 through stage 4 chronic kidney disease, or unspecified chronic kidney disease: Secondary | ICD-10-CM | POA: Diagnosis present

## 2023-05-10 DIAGNOSIS — C779 Secondary and unspecified malignant neoplasm of lymph node, unspecified: Secondary | ICD-10-CM | POA: Diagnosis present

## 2023-05-10 DIAGNOSIS — N1831 Chronic kidney disease, stage 3a: Secondary | ICD-10-CM | POA: Diagnosis present

## 2023-05-10 DIAGNOSIS — K921 Melena: Secondary | ICD-10-CM | POA: Diagnosis present

## 2023-05-10 DIAGNOSIS — C3491 Malignant neoplasm of unspecified part of right bronchus or lung: Secondary | ICD-10-CM | POA: Diagnosis present

## 2023-05-10 DIAGNOSIS — B370 Candidal stomatitis: Secondary | ICD-10-CM | POA: Diagnosis present

## 2023-05-10 LAB — URINALYSIS, ROUTINE W REFLEX MICROSCOPIC
Bilirubin Urine: NEGATIVE
Glucose, UA: NEGATIVE mg/dL
Hgb urine dipstick: NEGATIVE
Ketones, ur: NEGATIVE mg/dL
Leukocytes,Ua: NEGATIVE
Nitrite: NEGATIVE
Protein, ur: NEGATIVE mg/dL
Specific Gravity, Urine: 1.01 (ref 1.005–1.030)
pH: 5 (ref 5.0–8.0)

## 2023-05-10 LAB — CBC WITH DIFFERENTIAL/PLATELET
Abs Immature Granulocytes: 0.33 10*3/uL — ABNORMAL HIGH (ref 0.00–0.07)
Basophils Absolute: 0 10*3/uL (ref 0.0–0.1)
Basophils Relative: 0 %
Eosinophils Absolute: 0 10*3/uL (ref 0.0–0.5)
Eosinophils Relative: 0 %
HCT: 27 % — ABNORMAL LOW (ref 36.0–46.0)
Hemoglobin: 9 g/dL — ABNORMAL LOW (ref 12.0–15.0)
Immature Granulocytes: 2 %
Lymphocytes Relative: 15 %
Lymphs Abs: 2.2 10*3/uL (ref 0.7–4.0)
MCH: 28.8 pg (ref 26.0–34.0)
MCHC: 33.3 g/dL (ref 30.0–36.0)
MCV: 86.5 fL (ref 80.0–100.0)
Monocytes Absolute: 0.6 10*3/uL (ref 0.1–1.0)
Monocytes Relative: 4 %
Neutro Abs: 11.9 10*3/uL — ABNORMAL HIGH (ref 1.7–7.7)
Neutrophils Relative %: 79 %
Platelets: 127 10*3/uL — ABNORMAL LOW (ref 150–400)
RBC: 3.12 MIL/uL — ABNORMAL LOW (ref 3.87–5.11)
RDW: 19.1 % — ABNORMAL HIGH (ref 11.5–15.5)
WBC: 15.1 10*3/uL — ABNORMAL HIGH (ref 4.0–10.5)
nRBC: 0.3 % — ABNORMAL HIGH (ref 0.0–0.2)

## 2023-05-10 LAB — CBG MONITORING, ED: Glucose-Capillary: 162 mg/dL — ABNORMAL HIGH (ref 70–99)

## 2023-05-10 LAB — COMPREHENSIVE METABOLIC PANEL
ALT: 37 U/L (ref 0–44)
AST: 66 U/L — ABNORMAL HIGH (ref 15–41)
Albumin: 1.9 g/dL — ABNORMAL LOW (ref 3.5–5.0)
Alkaline Phosphatase: 118 U/L (ref 38–126)
Anion gap: 7 (ref 5–15)
BUN: 35 mg/dL — ABNORMAL HIGH (ref 8–23)
CO2: 23 mmol/L (ref 22–32)
Calcium: 9.7 mg/dL (ref 8.9–10.3)
Chloride: 111 mmol/L (ref 98–111)
Creatinine, Ser: 1.91 mg/dL — ABNORMAL HIGH (ref 0.44–1.00)
GFR, Estimated: 26 mL/min — ABNORMAL LOW (ref >60)
Glucose, Bld: 134 mg/dL — ABNORMAL HIGH (ref 70–99)
Potassium: 3.5 mmol/L (ref 3.5–5.1)
Sodium: 141 mmol/L (ref 135–145)
Total Bilirubin: 2.1 mg/dL — ABNORMAL HIGH (ref 0.0–1.2)
Total Protein: 4.9 g/dL — ABNORMAL LOW (ref 6.5–8.1)

## 2023-05-10 LAB — PROCALCITONIN: Procalcitonin: 0.81 ng/mL

## 2023-05-10 LAB — GLUCOSE, CAPILLARY
Glucose-Capillary: 162 mg/dL — ABNORMAL HIGH (ref 70–99)
Glucose-Capillary: 170 mg/dL — ABNORMAL HIGH (ref 70–99)

## 2023-05-10 LAB — PROTIME-INR
INR: 1.7 — ABNORMAL HIGH (ref 0.8–1.2)
Prothrombin Time: 20.4 s — ABNORMAL HIGH (ref 11.4–15.2)

## 2023-05-10 LAB — TYPE AND SCREEN
ABO/RH(D): O POS
Antibody Screen: NEGATIVE

## 2023-05-10 LAB — RESP PANEL BY RT-PCR (RSV, FLU A&B, COVID)  RVPGX2
Influenza A by PCR: NEGATIVE
Influenza B by PCR: NEGATIVE
Resp Syncytial Virus by PCR: NEGATIVE
SARS Coronavirus 2 by RT PCR: POSITIVE — AB

## 2023-05-10 LAB — BRAIN NATRIURETIC PEPTIDE: B Natriuretic Peptide: 150.8 pg/mL — ABNORMAL HIGH (ref 0.0–100.0)

## 2023-05-10 LAB — LIPASE, BLOOD: Lipase: 33 U/L (ref 11–51)

## 2023-05-10 LAB — STREP PNEUMONIAE URINARY ANTIGEN: Strep Pneumo Urinary Antigen: NEGATIVE

## 2023-05-10 LAB — POC OCCULT BLOOD, ED: Fecal Occult Bld: POSITIVE — AB

## 2023-05-10 LAB — TROPONIN I (HIGH SENSITIVITY)
Troponin I (High Sensitivity): 23 ng/L — ABNORMAL HIGH (ref <18)
Troponin I (High Sensitivity): 22 ng/L — ABNORMAL HIGH (ref <18)

## 2023-05-10 MED ORDER — ONDANSETRON HCL 4 MG PO TABS: 4.0000 mg | ORAL_TABLET | Freq: Four times a day (QID) | ORAL | Status: DC | PRN

## 2023-05-10 MED ORDER — SODIUM CHLORIDE 0.9 % IV SOLN
500.0000 mg | INTRAVENOUS | Status: AC
  Administered 2023-05-10 – 2023-05-14 (×5): 500 mg via INTRAVENOUS
  Filled 2023-05-10 (×5): qty 5

## 2023-05-10 MED ORDER — DEXAMETHASONE SODIUM PHOSPHATE 10 MG/ML IJ SOLN: 6.0000 mg | INTRAMUSCULAR | Status: DC

## 2023-05-10 MED ORDER — SODIUM CHLORIDE 0.9 % IV SOLN
100.0000 mg | Freq: Every day | INTRAVENOUS | Status: AC
  Administered 2023-05-11 – 2023-05-12 (×2): 100 mg via INTRAVENOUS
  Filled 2023-05-10 (×2): qty 20

## 2023-05-10 MED ORDER — ATORVASTATIN CALCIUM 10 MG PO TABS
20.0000 mg | ORAL_TABLET | Freq: Every day | ORAL | Status: DC
  Administered 2023-05-10 – 2023-05-15 (×6): 20 mg via ORAL
  Filled 2023-05-10 (×6): qty 2

## 2023-05-10 MED ORDER — INSULIN ASPART 100 UNIT/ML IJ SOLN
0.0000 [IU] | Freq: Three times a day (TID) | INTRAMUSCULAR | Status: DC
  Administered 2023-05-10: 4 [IU] via SUBCUTANEOUS
  Administered 2023-05-11 – 2023-05-13 (×5): 3 [IU] via SUBCUTANEOUS
  Administered 2023-05-13: 4 [IU] via SUBCUTANEOUS
  Administered 2023-05-13: 3 [IU] via SUBCUTANEOUS
  Administered 2023-05-14 (×2): 4 [IU] via SUBCUTANEOUS
  Administered 2023-05-14: 3 [IU] via SUBCUTANEOUS
  Administered 2023-05-15 – 2023-05-16 (×5): 4 [IU] via SUBCUTANEOUS
  Filled 2023-05-10: qty 0.2

## 2023-05-10 MED ORDER — VANCOMYCIN HCL IN DEXTROSE 1-5 GM/200ML-% IV SOLN
1000.0000 mg | Freq: Once | INTRAVENOUS | Status: AC
  Administered 2023-05-10: 1000 mg via INTRAVENOUS
  Filled 2023-05-10: qty 200

## 2023-05-10 MED ORDER — REMDESIVIR 100 MG IV SOLR
200.0000 mg | Freq: Once | INTRAVENOUS | Status: AC
  Administered 2023-05-10: 200 mg via INTRAVENOUS
  Filled 2023-05-10: qty 40

## 2023-05-10 MED ORDER — DEXAMETHASONE SODIUM PHOSPHATE 10 MG/ML IJ SOLN
6.0000 mg | Freq: Two times a day (BID) | INTRAMUSCULAR | Status: DC
  Administered 2023-05-10 – 2023-05-14 (×9): 6 mg via INTRAVENOUS
  Filled 2023-05-10 (×9): qty 1

## 2023-05-10 MED ORDER — DEXAMETHASONE SODIUM PHOSPHATE 10 MG/ML IJ SOLN
10.0000 mg | Freq: Once | INTRAMUSCULAR | Status: DC
  Filled 2023-05-10: qty 1

## 2023-05-10 MED ORDER — SODIUM CHLORIDE 0.9 % IV SOLN
2.0000 g | INTRAVENOUS | Status: AC
  Administered 2023-05-10 – 2023-05-14 (×5): 2 g via INTRAVENOUS
  Filled 2023-05-10 (×5): qty 20

## 2023-05-10 MED ORDER — PIPERACILLIN-TAZOBACTAM 3.375 G IVPB 30 MIN
3.3750 g | Freq: Once | INTRAVENOUS | Status: AC
  Administered 2023-05-10: 3.375 g via INTRAVENOUS
  Filled 2023-05-10: qty 50

## 2023-05-10 MED ORDER — ONDANSETRON HCL 4 MG/2ML IJ SOLN: 4.0000 mg | Freq: Four times a day (QID) | INTRAMUSCULAR | Status: DC | PRN

## 2023-05-10 MED ORDER — ACETAMINOPHEN 650 MG RE SUPP: 650.0000 mg | Freq: Four times a day (QID) | RECTAL | Status: DC | PRN

## 2023-05-10 MED ORDER — ALBUTEROL SULFATE (2.5 MG/3ML) 0.083% IN NEBU
2.5000 mg | INHALATION_SOLUTION | RESPIRATORY_TRACT | Status: DC | PRN
  Administered 2023-05-10: 2.5 mg via RESPIRATORY_TRACT
  Filled 2023-05-10 (×2): qty 3

## 2023-05-10 MED ORDER — ACETAMINOPHEN 325 MG PO TABS
650.0000 mg | ORAL_TABLET | Freq: Four times a day (QID) | ORAL | Status: DC | PRN
  Administered 2023-05-11 – 2023-05-16 (×3): 650 mg via ORAL
  Filled 2023-05-10 (×3): qty 2

## 2023-05-10 MED ORDER — FUROSEMIDE 10 MG/ML IJ SOLN
40.0000 mg | Freq: Once | INTRAMUSCULAR | Status: AC
  Administered 2023-05-10: 40 mg via INTRAVENOUS
  Filled 2023-05-10: qty 4

## 2023-05-10 MED ORDER — DEXAMETHASONE SODIUM PHOSPHATE 10 MG/ML IJ SOLN
10.0000 mg | Freq: Once | INTRAMUSCULAR | Status: AC
  Administered 2023-05-10: 10 mg via INTRAVENOUS

## 2023-05-10 NOTE — Consult Note (Signed)
 Reason for Consult: Rectal bleeding. Referring Physician: [facility]  Christy [name] is an 83 y.o. female.  HPI: Christy [name] is a 83 year old black female with multiple multiple medical problems listed below including AODM, hyperlipidemia, hypertension, history of CVA, stage IV breast cancer, who presented to the emergency room for rectal bleeding patient is unable to give me details on her history.  Per her record she takes aspirin but is not on any other anticoagulants at the present time.  She denies any abdominal abdominal pain nausea vomiting. She has a history of constipation where she can go a day or 2 without having a bowel movement.  She cannot tell me when she last had a colonoscopy and seems somewhat confused about the question when I asked her about it.  She was FOBT positive in the emergency room and was also positive for Coronavirus by PCR for influenza and RSV by PCR.  In the ER she was noted to have a hemoglobin of 9 g/dL with platelet count of 1 27K.  BNP was 850.8 G per mL and creatinine was elevated 1.91.  Was 1.38 about 8 days ago.  According to her medication list she is on ferrous sulfate for the known history of iron deficiency anemia but I am not sure as as to how long she has been anemic.  CT of the abdomen pelvis without contrast she is noted to have a distended gallbladder with focal wall thickening and contraction in the fundal region with no evidence of calcified stones although subtle layering increased density seen in the dependent lumen stated suggesting the presence of an calcified stones no focal mass lesion is noted in the pancreas, there is no evidence of splenomegaly, a small hiatal hernia is noted; the rest of the stomach duodenum small bowel and colon appears normal with diffuse body wall edema; diffuse interlobular septal thickening in both lungs is noted with patchy areas of groundglass and confluent airspace disease bilaterally which may be nonspecific or  related to pulmonary edema versus infectious or inflammatory process areas of cylindrical bronchiectasis in both lungs is noted and patient has multiple normal pulmonary nodules on CT that are essentially obscured by airspace disease.  Past Medical History:  Diagnosis Date   AODM Diabetes mellitus without complication (HCC)    Hypertension Hyperlipidemia    Hyperlipidemia Hypertension    Past Surgical History:  Procedure Laterality Date   FOOT FRACTURE SURGERY Right    IR IMAGING GUIDED PORT INSERTION  09/22/2020   IR RADIOLOGIST EVAL & MGMT  09/25/2020   Family History  Problem Relation Age of Onset   Cancer Sister 84       Breast   Cancer Niece        Possibly breast   Social History:  reports that she has quit smoking. She has never used smokeless tobacco. She reports that she does not currently use alcohol. She reports that she does not use drugs.  Allergies: No Known Allergies  Medications: I have reviewed the patient's current medications. Prior to Admission:  Medications Prior to Admission  Medication Sig Dispense Refill Last Dose/Taking   atorvastatin (LIPITOR) 20 MG tablet Take 1 tablet by mouth once daily 60 tablet 0 Past Week   dexamethasone (DECADRON) 4 MG tablet Take 2 tablets (8 mg) by mouth daily for 3 days starting the day after chemotherapy. Take with food. 30 tablet 1 Taking   Ferrous Sulfate (IRON) 325 (65 Fe) MG TABS Take 1 tablet by mouth once daily  with breakfast 30 tablet 0 Past Week   furosemide (LASIX) 20 MG tablet Take 1 tablet (20 mg total) by mouth daily as needed. (Patient taking differently: Take 20 mg by mouth daily.) 30 tablet 0 05/09/2023   metFORMIN (GLUCOPHAGE) 500 MG tablet Take 1 tablet (500 mg total) by mouth 2 (two) times daily with a meal. 60 tablet 1 Past Month   olmesartan (BENICAR) 40 MG tablet Take 1 tablet (40 mg total) by mouth daily. 30 tablet 0 Past Week   potassium chloride SA (KLOR-CON M) 20 MEQ tablet Take 1 tablet (20 mEq total) by  mouth daily. ONLY take when she is on lasix 30 tablet 0 05/09/2023   aspirin EC 325 MG tablet Take 1 tablet (325 mg total) by mouth daily. (Patient not taking: Reported on 05/10/2023) 60 tablet 3 Not Taking   lidocaine-prilocaine (EMLA) cream Apply 1 Application topically as needed. (Patient not taking: Reported on 05/10/2023) 30 g 2 Not Taking   lidocaine-prilocaine (EMLA) cream Apply to affected area once (Patient not taking: Reported on 05/10/2023) 30 g 3 Not Taking   mupirocin ointment (BACTROBAN) 2 % Apply small amount to effected areas twice daily for 7 days (Patient not taking: Reported on 05/10/2023) 66 g 0 Not Taking   ondansetron (ZOFRAN) 8 MG tablet Take 1 tablet (8 mg total) by mouth every 8 (eight) hours as needed for nausea or vomiting. Start on the third day after chemotherapy. (Patient not taking: Reported on 04/11/2023) 30 tablet 1 Not Taking   Scheduled:  atorvastatin  20 mg Oral Daily   dexamethasone (DECADRON) injection  6 mg Intravenous Q12H   insulin aspart  0-20 Units Subcutaneous TID WC   Continuous:  azithromycin Stopped (05/10/23 1217)   cefTRIAXone (ROCEPHIN)  IV Stopped (05/10/23 1216)   [START ON 05/11/2023] remdesivir 100 mg in sodium chloride 0.9 % 100 mL IVPB     Results for orders placed or performed during the hospital encounter of 05/09/23 (from the past 48 hours)  Resp panel by RT-PCR (RSV, Flu A&B, Covid) Anterior Nasal Swab     Status: Abnormal   Collection Time: 05/10/23 12:26 AM   Specimen: Anterior Nasal Swab  Result Value Ref Range   SARS Coronavirus 2 by RT PCR POSITIVE (A) NEGATIVE    Comment: (NOTE) SARS-CoV-2 target nucleic acids are DETECTED.  The SARS-CoV-2 RNA is generally detectable in upper respiratory specimens during the acute phase of infection. Positive results are indicative of the presence of the identified virus, but do not rule out bacterial infection or co-infection with other pathogens not detected by the test. Clinical correlation with  patient history and other diagnostic information is necessary to determine patient infection status. The expected result is Negative.  Fact Sheet for Patients: BloggerCourse.com  Fact Sheet for Healthcare Providers: SeriousBroker.it  This test is not yet approved or cleared by the [place] FDA and  has been authorized for detection and/or diagnosis of SARS-CoV-2 by FDA under an Emergency Use Authorization (EUA).  This EUA will remain in effect (meaning this test can be used) for the duration of  the COVID-19 declaration under Section 564(b)(1) of the A ct, 21 U.S.C. section 360bbb-3(b)(1), unless the authorization is terminated or revoked sooner.     Influenza A by PCR NEGATIVE NEGATIVE   Influenza B by PCR NEGATIVE NEGATIVE    Comment: (NOTE) The Xpert Xpress SARS-CoV-2/FLU/RSV plus assay is intended as an aid in the diagnosis of influenza from Nasopharyngeal swab specimens and should not  be used as a sole basis for treatment. Nasal washings and aspirates are unacceptable for Xpert Xpress SARS-CoV-2/FLU/RSV testing.  Fact Sheet for Patients: BloggerCourse.com  Fact Sheet for Healthcare Providers: SeriousBroker.it  This test is not yet approved or cleared by the [place] FDA and has been authorized for detection and/or diagnosis of SARS-CoV-2 by FDA under an Emergency Use Authorization (EUA). This EUA will remain in effect (meaning this test can be used) for the duration of the COVID-19 declaration under Section 564(b)(1) of the Act, 21 U.S.C. section 360bbb-3(b)(1), unless the authorization is terminated or revoked.     Resp Syncytial Virus by PCR NEGATIVE NEGATIVE    Comment: (NOTE) Fact Sheet for Patients: BloggerCourse.com  Fact Sheet for Healthcare Providers: SeriousBroker.it  This test is not yet approved  or cleared by the [place] FDA and has been authorized for detection and/or diagnosis of SARS-CoV-2 by FDA under an Emergency Use Authorization (EUA). This EUA will remain in effect (meaning this test can be used) for the duration of the COVID-19 declaration under Section 564(b)(1) of the Act, 21 U.S.C. section 360bbb-3(b)(1), unless the authorization is terminated or revoked.  Performed at [facility], [address] [place], [place] [address]   CBC with Differential     Status: Abnormal   Collection Time: 05/10/23  1:05 AM  Result Value Ref Range   WBC 15.1 (H) 4.0 - 10.5 K/uL   RBC 3.12 (L) 3.87 - 5.11 MIL/uL   Hemoglobin 9.0 (L) 12.0 - 15.0 g/dL   HCT 01.0 (L) 27.2 - 53.6 %   MCV 86.5 80.0 - 100.0 fL   MCH 28.8 26.0 - 34.0 pg   MCHC 33.3 30.0 - 36.0 g/dL   RDW 64.4 (H) 03.4 - 74.2 %   Platelets 127 (L) 150 - 400 K/uL    Comment: REPEATED TO VERIFY   nRBC 0.3 (H) 0.0 - 0.2 %   Neutrophils Relative % 79 %   Neutro Abs 11.9 (H) 1.7 - 7.7 K/uL   Lymphocytes Relative 15 %   Lymphs Abs 2.2 0.7 - 4.0 K/uL   Monocytes Relative 4 %   Monocytes Absolute 0.6 0.1 - 1.0 K/uL   Eosinophils Relative 0 %   Eosinophils Absolute 0.0 0.0 - 0.5 K/uL   Basophils Relative 0 %   Basophils Absolute 0.0 0.0 - 0.1 K/uL   Immature Granulocytes 2 %   Abs Immature Granulocytes 0.33 (H) 0.00 - 0.07 K/uL    Comment: Performed at [facility], [address], [place], [place] [address]  Comprehensive metabolic panel     Status: Abnormal   Collection Time: 05/10/23  1:05 AM  Result Value Ref Range   Sodium 141 135 - 145 mmol/L   Potassium 3.5 3.5 - 5.1 mmol/L   Chloride 111 98 - 111 mmol/L   CO2 23 22 - 32 mmol/L   Glucose, Bld 134 (H) 70 - 99 mg/dL    Comment: Glucose reference range applies only to samples taken after fasting for at least 8 hours.   BUN 35 (H) 8 - 23 mg/dL   Creatinine, Ser 8.75 (H) 0.44 - 1.00 mg/dL   Calcium 9.7 8.9 - 64.3 mg/dL    Total Protein 4.9 (L) 6.5 - 8.1 g/dL   Albumin 1.9 (L) 3.5 - 5.0 g/dL   AST 66 (H) 15 - 41 U/L   ALT 37 0 - 44 U/L   Alkaline Phosphatase 118 38 - 126 U/L   Total  Bilirubin 2.1 (H) 0.0 - 1.2 mg/dL   GFR, Estimated 26 (L) >60 mL/min    Comment: (NOTE) Calculated using the CKD-EPI Creatinine Equation (2021)    Anion gap 7 5 - 15    Comment: Performed at [facility], [address], [place], [place] [address]  Troponin I (High Sensitivity)     Status: Abnormal   Collection Time: 05/10/23  1:05 AM  Result Value Ref Range   Troponin I (High Sensitivity) 23 (H) <18 ng/L    Comment: (NOTE) Elevated high sensitivity troponin I (hsTnI) values and significant  changes across serial measurements may suggest ACS but many other  chronic and acute conditions are known to elevate hsTnI results.  Refer to the "Links" section for chest pain algorithms and additional  guidance. Performed at [facility], [address], [place], [place] [address]   Protime-INR     Status: Abnormal   Collection Time: 05/10/23  1:05 AM  Result Value Ref Range   Prothrombin Time 20.4 (H) 11.4 - 15.2 seconds   INR 1.7 (H) 0.8 - 1.2    Comment: (NOTE) INR goal varies based on device and disease states. Performed at [facility], [address] [place], [place] [address]   Lipase, blood     Status: None   Collection Time: 05/10/23  1:05 AM  Result Value Ref Range   Lipase 33 11 - 51 U/L    Comment: Performed at [facility], [address], [place], [place] [address]  Brain natriuretic peptide     Status: Abnormal   Collection Time: 05/10/23  1:05 AM  Result Value Ref Range   B Natriuretic Peptide 150.8 (H) 0.0 - 100.0 pg/mL    Comment: Performed at [facility], [address] [place], [place] [address]  Type and screen [facility]     Status: None   Collection Time: 05/10/23  1:10 AM  Result  Value Ref Range   ABO/RH(D) O POS    Antibody Screen NEG    Sample Expiration      05/13/2023,2359 Performed at [facility], [address], [place], [place] [address]   POC occult blood, ED     Status: Abnormal   Collection Time: 05/10/23  1:28 AM  Result Value Ref Range   Fecal Occult Bld POSITIVE (A) NEGATIVE   CT CHEST ABDOMEN PELVIS WO CONTRAST Result Date: 05/10/2023 CLINICAL DATA:  Abdominal pain. Dyspnea. Rectal bleeding. Dark stools. Positive fetal occult stool card. History of breast cancer. * Tracking Code: BO * EXAM: CT CHEST, ABDOMEN AND PELVIS WITHOUT CONTRAST TECHNIQUE: Multidetector CT imaging of the chest, abdomen and pelvis was performed following the standard protocol without IV contrast. RADIATION DOSE REDUCTION: This exam was performed according to the departmental dose-optimization program which includes automated exposure control, adjustment of the mA and/or kV according to patient size and/or use of iterative reconstruction technique. COMPARISON:  Chest abdomen pelvis CT 10/03/2022 FINDINGS: CT CHEST FINDINGS Cardiovascular: The heart size is normal. No substantial pericardial effusion. Coronary artery calcification is evident. Moderate atherosclerotic calcification is noted in the wall of the thoracic aorta. Right-sided Port-A-Cath tip is positioned in the mid to distal SVC. Mediastinum/Nodes: No mediastinal lymphadenopathy. No evidence for gross hilar lymphadenopathy although assessment is limited by the lack of intravenous contrast on the current study. The esophagus has normal imaging features. 11 mm short axis left subpectoral lymph node was 9 mm previously. Lungs/Pleura:  Diffuse interlobular septal thickening is noted in both lungs with patchy areas of ground-glass and confluent airspace disease bilaterally. Areas of cylindrical bronchiectasis in both lungs are similar to prior with some areas of cystic bronchiectasis in the inferior right lung base. The  patient had multiple pulmonary nodules on the previous CT that are essentially obscured by airspace disease on the current exam. Areas of impaction noted in the bronchiectatic airways of the right lower lobe. Musculoskeletal: Both clavicles appear sclerotic, similar to prior. Finding is nonspecific and most likely benign given the symmetry. CT ABDOMEN PELVIS FINDINGS Hepatobiliary: No suspicious focal abnormality in the liver on this study without intravenous contrast. Gallbladder is distended with focal wall thickening and contraction in the fundal region. No calcified gallstones although subtle layering increased density is seen in the dependent lumen suggesting the presence of uncalcified stones. No intrahepatic or extrahepatic biliary dilation. Pancreas: No focal mass lesion. No dilatation of the main duct. No intraparenchymal cyst. No peripancreatic edema. Spleen: No splenomegaly. No suspicious focal mass lesion. Adrenals/Urinary Tract: No adrenal nodule or mass. Similar 2.8 cm water density lesion upper pole left kidney most suggestive of a cyst. Nonobstructing stones are again noted left kidney. Right kidney unremarkable. No evidence for hydroureter. The urinary bladder appears normal for the degree of distention. Stomach/Bowel: Tiny hiatal hernia. Stomach decompressed. Duodenum is normally positioned as is the ligament of Treitz. No small bowel wall thickening. No small bowel dilatation. The terminal ileum is normal. The appendix is normal. No gross colonic mass. No colonic wall thickening. Vascular/Lymphatic: There is moderate atherosclerotic calcification of the abdominal aorta without aneurysm. There is no gastrohepatic or hepatoduodenal ligament lymphadenopathy. No retroperitoneal or mesenteric lymphadenopathy. No pelvic sidewall lymphadenopathy. Reproductive: The uterus is unremarkable.  There is no adnexal mass. Other: No intraperitoneal free fluid. Musculoskeletal: Diffuse body wall edema evident. No  worrisome lytic or sclerotic osseous abnormality. IMPRESSION: 1. Diffuse interlobular septal thickening in both lungs with patchy areas of ground-glass and confluent airspace disease bilaterally. Imaging features are nonspecific and may be related to pulmonary edema or an infectious/inflammatory process. Pulmonary/lymphangitic metastatic involvement is considered less likely. 2. Areas of cylindrical bronchiectasis in both lungs are similar to prior with some areas of cystic bronchiectasis in the inferior right lung base. Areas of impaction noted in the bronchiectatic airways of the right lower lobe. 3. The patient had multiple pulmonary nodules on the previous CT that are essentially obscured by airspace disease on the current exam. 4. 11 mm short axis left subpectoral lymph node was 9 mm previously. 5. Distended gallbladder with focal wall thickening and contraction in the fundal region. While probably related to adenomyomatosis, follow-up nonemergent outpatient right upper quadrant ultrasound recommended to further evaluate. No calcified gallstones although subtle layering increased density is seen in the dependent lumen suggesting the presence of uncalcified stones. 6. Nonobstructing left renal stones. 7. Diffuse body wall edema. Electronically Signed   By: Kennith Center M.D.   On: 05/10/2023 06:36   DG Chest 2 View Result Date: 05/10/2023 CLINICAL DATA:  Shortness of breath and cough. History of breast cancer. EXAM: CHEST - 2 VIEW COMPARISON:  Radiographs 05/02/2023 and CT chest 12/20/2022 FINDINGS: Right chest wall Port-A-Cath tip in the low SVC. Stable cardiomediastinal silhouette. Diffuse bilateral airspace and interstitial opacities have progressed compared to 05/02/2023. No pleural effusion or pneumothorax. No displaced rib fracture. IMPRESSION: Diffuse bilateral airspace and interstitial opacities have progressed compared to 05/02/2023. This is favored to represent pneumonia. Edema could appear  similarly.  The known pulmonary nodules are not well demonstrated radiographically. Electronically Signed   By: Minerva Fester M.D.   On: 05/10/2023 01:26    Review of Systems  Unable to perform ROS: Acuity of condition   Blood pressure (!) 174/76, pulse 68, temperature 97.8 F (36.6 C), resp. rate (!) 30, height 5\' 4"  (1.626 m), weight 71.9 kg, SpO2 97%. Physical Exam Constitutional:      General: She is not in acute distress.    Appearance: She is not ill-appearing.  HENT:     Head: Normocephalic and atraumatic.     Mouth/Throat:     Mouth: Mucous membranes are dry.  Eyes:     Extraocular Movements: Extraocular movements intact.     Pupils: Pupils are equal, round, and reactive to light.  Cardiovascular:     Rate and Rhythm: Normal rate and regular rhythm.     Pulses: Normal pulses.  Pulmonary:     Effort: Pulmonary effort is normal.     Breath sounds: Normal breath sounds.  Abdominal:     General: Bowel sounds are normal. There is no distension.     Palpations: There is no mass.     Tenderness: There is no abdominal tenderness. There is no guarding or rebound.     Hernia: No hernia is present.  Musculoskeletal:     Cervical back: Normal range of motion and neck supple.  Skin:    General: Skin is warm and dry.  Neurological:     General: No focal deficit present.    Assessment/Plan: 1) Guaiac positive stools with anemia/constipation-we will try to procure patient's old colonoscopy reports from her PCP Dr. Concepcion Elk tomorrow.  Unable to find any colonoscopy report in Epic. 2) Acute respiratory failure with hypoxia in the setting of COVID-19 viral infection. 3) Acute on chronic diastolic congestive heart failure 4) Acute kidney injury. 5) Stage IV breast cancer; ? Pneumonitis due to Enhertu treatment-there is have been recommended by Dr. Mosetta Putt 6) Hyperlipidemia. 7) Patient is a full code at the present time; DNR status recommended by Dr. William Dalton to decide  Charna Tatayana 05/10/2023, 9:00 AM

## 2023-05-10 NOTE — Progress Notes (Signed)
 [facility] admitting physician addendum:  Oncology has recommended that dexamethasone be increased to 6 mg IVP every 12 hours, which is equivalent to 80 mg of prednisone daily due to high risk of pneumonitis.  Sanda Klein, MD

## 2023-05-10 NOTE — H&P (Signed)
 History and Physical    Patient: Christy [name]:[id number] DOB: 24-Nov-1940 DOA: 05/09/2023 DOS: the patient was seen and examined on 05/10/2023 PCP: [name], MD  Patient coming from: Home  Chief Complaint:  Chief Complaint  Patient presents with   Rectal Bleeding   HPI: Christy [name] is a 83 y.o. female with medical history significant of stage IV breast cancer, history of CVA, type 2 diabetes, hyperlipidemia, hypertension who was brought via EMS to the emergency department due to rectal bleeding. Per patient, she melanotic stool BM and later BRBPR.  She takes aspirin, but is on on anticoagulation. No fever, chills or night sweats. No sore throat, rhinorrhea, dyspnea, wheezing or hemoptysis. No chest pain, palpitations, diaphoresis, PND, orthopnea or pitting edema of the lower extremities. No appetite changes, abdominal pain, diarrhea or constipation. No flank pain, dysuria, frequency or hematuria. No polyuria, polydipsia, polyphagia or blurred vision.  Lab work: Fecal occult blood was positive.  Coronavirus PCR was positive, negative influenza and RSV PCR.  CBC showed a white count of 15.1, hemoglobin 9.0 g/dL platelets 811.  PT 20.4 and INR 1.7.  Troponin was 23 ng/L and BNP 850.8 pg/mL.  CMP showed a glucose of 134, BUN of 35, creatinine 1.91 (1.38 about 8 days ago) and corrected calcium 11.4 mg/dL.  Imaging: 2 view chest radiograph showed diffuse bilateral airspace and interstitial opacities that have progressed since 05/02/2023 which is favored to represent pneumonia.  Edema could appear similarly.  The known pulmonary nodules are not well-demonstrated.  CT chest/abdomen/pelvis without contrast showing diffuse interlobular septal thickening in both lungs with patchy areas of groundglass and confluent airspace disease bilaterally.  Pulmonary edema versus infectious process.  Less likely pulmonary/lymphangitic metastatic involvement.  Areas of cylindrical bronchiectasis in both  lungs are similar to prior with some areas of cystic bronchiectasis in the inferior right lung base.  The patient, multiple pulmonary nodules on the previous CT.  Distended gallbladder with focal wall thickening.  Questionable noncalcified stones.  Nonemergent ultrasound recommended.  Increased left subpectoral lymph node.  Diffuse body wall edema.   ED course: Initial vital signs were temperature 97.4 F, pulse 90, respirations 16, BP 151/83 mmHg O2 sat 96% on nasal cannula oxygen .  The patient received dexamethasone 10 mg IVP, furosemide 40 mg IVP, vancomycin and Zosyn.  Review of Systems: As mentioned in the history of present illness. All other systems reviewed and are negative.  Past Medical History:  Diagnosis Date   Diabetes mellitus without complication (HCC)    Hyperlipidemia    Hypertension    Past Surgical History:  Procedure Laterality Date   FOOT FRACTURE SURGERY Right    IR IMAGING GUIDED PORT INSERTION  09/22/2020   IR RADIOLOGIST EVAL & MGMT  09/25/2020   Social History:  reports that she has quit smoking. She has never used smokeless tobacco. She reports that she does not currently use alcohol. She reports that she does not use drugs.  No Known Allergies  Family History  Problem Relation Age of Onset   Cancer Sister 49       Breast   Cancer Niece        Possibly breast    Prior to Admission medications   Medication Sig Start Date End Date Taking? Authorizing Provider  atorvastatin (LIPITOR) 20 MG tablet Take 1 tablet by mouth once daily 01/31/23  Yes Vaslow, Georgeanna Lea, MD  dexamethasone (DECADRON) 4 MG tablet Take 2 tablets (8 mg) by mouth daily for 3  days starting the day after chemotherapy. Take with food. 01/04/23  Yes Malachy Mood, MD  Ferrous Sulfate (IRON) 325 (65 Fe) MG TABS Take 1 tablet by mouth once daily with breakfast 04/03/23  Yes Thayil, Irene T, PA-C  furosemide (LASIX) 20 MG tablet Take 1 tablet (20 mg total) by mouth daily as needed. Patient taking  differently: Take 20 mg by mouth daily. 05/02/23 06/01/23 Yes Malachy Mood, MD  metFORMIN (GLUCOPHAGE) 500 MG tablet Take 1 tablet (500 mg total) by mouth 2 (two) times daily with a meal. 09/27/19  Yes Bast, Traci A, FNP  olmesartan (BENICAR) 40 MG tablet Take 1 tablet (40 mg total) by mouth daily. 01/04/23  Yes Pollyann Samples, NP  potassium chloride SA (KLOR-CON M) 20 MEQ tablet Take 1 tablet (20 mEq total) by mouth daily. ONLY take when she is on lasix 05/02/23  Yes Malachy Mood, MD  aspirin EC 325 MG tablet Take 1 tablet (325 mg total) by mouth daily. Patient not taking: Reported on 05/10/2023 01/04/23   Pollyann Samples, NP  lidocaine-prilocaine (EMLA) cream Apply 1 Application topically as needed. Patient not taking: Reported on 05/10/2023 10/26/22   Malachy Mood, MD  lidocaine-prilocaine (EMLA) cream Apply to affected area once Patient not taking: Reported on 05/10/2023 01/04/23   Malachy Mood, MD  mupirocin ointment (BACTROBAN) 2 % Apply small amount to effected areas twice daily for 7 days Patient not taking: Reported on 05/10/2023 04/11/23   Carlean Jews, NP  ondansetron (ZOFRAN) 8 MG tablet Take 1 tablet (8 mg total) by mouth every 8 (eight) hours as needed for nausea or vomiting. Start on the third day after chemotherapy. Patient not taking: Reported on 04/11/2023 01/04/23   Malachy Mood, MD   Physical Exam: Vitals:   05/10/23 0130 05/10/23 0247 05/10/23 0700 05/10/23 0831  BP: (!) 157/84  (!) 174/76   Pulse: 67  68   Resp: 19  (!) 30   Temp:  97.7 F (36.5 C)  97.8 F (36.6 C)  TempSrc:  Oral    SpO2: 96%  97%   Weight:      Height:       Physical Exam Vitals and nursing note reviewed.  Constitutional:      General: She is awake. She is not in acute distress.    Appearance: She is ill-appearing.     Interventions: Nasal cannula in place.  HENT:     Head: Normocephalic.     Nose: No rhinorrhea.     Mouth/Throat:     Mouth: Mucous membranes are moist.  Eyes:     General: No scleral icterus.     Pupils: Pupils are equal, round, and reactive to light.  Neck:     Vascular: No JVD.  Cardiovascular:     Rate and Rhythm: Normal rate and regular rhythm.     Heart sounds: S1 normal and S2 normal.  Pulmonary:     Effort: Pulmonary effort is normal. No accessory muscle usage.     Breath sounds: Examination of the right-lower field reveals rales. Examination of the left-lower field reveals rales. Rales present. No wheezing or rhonchi.  Abdominal:     General: Bowel sounds are normal. There is no distension.     Palpations: Abdomen is soft.     Tenderness: There is no abdominal tenderness.  Musculoskeletal:     Cervical back: Neck supple.     Right lower leg: No edema.     Left lower leg: No edema.  Skin:    General: Skin is dry.     Coloration: Skin is pale.  Neurological:     General: No focal deficit present.     Mental Status: She is alert and oriented to person, place, and time.  Psychiatric:        Mood and Affect: Mood normal.        Behavior: Behavior normal. Behavior is cooperative.     Data Reviewed:  Results are pending, will review when available. 05/03/2023 echocardiogram complete. IMPRESSIONS:   1. Left ventricular ejection fraction, by estimation, is 60 to 65%. The  left ventricle has normal function. The left ventricle has no regional  wall motion abnormalities. There is moderate left ventricular hypertrophy.  Left ventricular diastolic  parameters are consistent with Grade I diastolic dysfunction (impaired  relaxation).   2. Right ventricular systolic function is normal. The right ventricular  size is normal. Tricuspid regurgitation signal is inadequate for assessing  PA pressure.   3. Left atrial size was mild to moderately dilated.   4. The mitral valve is degenerative. Mild mitral valve regurgitation. No  evidence of mitral stenosis.   5. The aortic valve is grossly normal. Aortic valve regurgitation is not  visualized. No aortic stenosis is  present.   6. The inferior vena cava is normal in size with greater than 50%  respiratory variability, suggesting right atrial pressure of 3 mmHg.   EKG: Vent. rate 72 BPM PR interval 163 ms QRS duration 99 ms QT/QTcB 384/421 ms P-R-T axes 60 44 76 Sinus rhythm Multiple premature complexes, vent & supravent  Assessment and Plan: Principal Problem:   Acute respiratory failure with hypoxia (HCC) In the setting of:   Pneumonia due to COVID-19 virus Superimposed on:   Acute on chronic diastolic congestive heart failure (HCC) Admit to telemetry/inpatient. Supplemental oxygen as needed. Bronchodilators as needed. Incentive spirometry while awake. Flutter valve exercises. Antitussives as needed. Continue dexamethasone 6 mg IVP daily. Remdesivir per pharmacy. Follow-up CBC, CMP and chemistry.  Active Problems:   Acute on chronic diastolic congestive heart failure (HCC) Supplemental oxygen as needed. Sodium and fluid restriction. Hold furosemide until renal function rechecked. Monitor daily weights, intake and output. Monitor renal function electrolytes.    AKI (acute kidney injury) (HCC) Hold ARB/ACE. Hold diuretic. Avoid hypotension. Avoid nephrotoxins. Monitor intake and output. Monitor renal function electrolytes.    Rectal bleed Likely hemorrhoidal. Monitor hematocrit and hemoglobin. GI consult requested by ED.    Elevated INR Nutritional?   Hepatic?    Normocytic anemia Monitor hematocrit and hemoglobin.    Thrombocytopenia (HCC) Monitor platelet count.    Malignant neoplasm of upper-outer quadrant of female breast (HCC) Follow-up at the cancer center with Dr. Mosetta Putt as scheduled.    Protein-calorie malnutrition, severe (HCC) In the setting of anemia and malignancy. May benefit from protein supplementation. Consider nutritional services evaluation. Follow-up albumin level.    Hyperbilirubinemia Follow-up bilirubin level in AM.     Hyperlipidemia Continue atorvastatin Milligrams p.o. daily.     Advance Care Planning:   Code Status: Full Code   Consults: Gastroenterology Jeani Hawking, MD).  Family Communication:   Severity of Illness: The appropriate patient status for this patient is INPATIENT. Inpatient status is judged to be reasonable and necessary in order to provide the required intensity of service to ensure the patient's safety. The patient's presenting symptoms, physical exam findings, and initial radiographic and laboratory data in the context of their chronic comorbidities is felt to place them at  high risk for further clinical deterioration. Furthermore, it is not anticipated that the patient will be medically stable for discharge from the hospital within 2 midnights of admission.   * I certify that at the point of admission it is my clinical judgment that the patient will require inpatient hospital care spanning beyond 2 midnights from the point of admission due to high intensity of service, high risk for further deterioration and high frequency of surveillance required.*  Author: Bobette Mo, MD 05/10/2023 8:39 AM  For on call review [email or web address].   This document was prepared using Dragon voice recognition software and may contain some unintended transcription errors.

## 2023-05-10 NOTE — Progress Notes (Signed)
 Christy [name]   DOB:1940/04/24   ZO#:[id number]   JWJ#:[id number]  Oncology f/u   Subjective: Patient is well-known to me, under my care for her metastatic breast cancer.  She is on Enhertu, last dose about 3 weeks ago.  She was recently evaluated in my office for progressive weakness, anasarca, dyspnea on exertion and new onset hypoxia.  She tried Lasix but the edema did not improve.  She was brought into the hospital by EMS due to the episode of rectal bleeding.  CT scan showed extensive bilateral pneumonitis, and she was tested positive for COVID.  She is on 5 L nasal cannula now, daughter and granddaughter at the bedside.  She has baseline dementia.  Her mental status is similar to her baseline.   Objective:  Vitals:   05/10/23 1622 05/10/23 1748  BP: (!) 158/72 (!) 142/77  Pulse: 82 87  Resp: (!) 22 16  Temp: 98.4 F (36.9 C) 98 F (36.7 C)  SpO2: 96% 95%    Body mass index is 25.28 kg/m. No intake or output data in the 24 hours ending 05/10/23 1818   Sclerae unicteric  Diffuse edema in legs and arm  Oropharynx clear  No peripheral adenopathy  MSK no focal spinal tenderness, no peripheral edema  Neuro nonfocal    CBG (last 3)  Recent Labs    05/10/23 1610 05/10/23 1804  GLUCAP 162* 162*     Labs:  Lab Results  Component Value Date   WBC 15.1 (H) 05/10/2023   HGB 9.0 (L) 05/10/2023   HCT 27.0 (L) 05/10/2023   MCV 86.5 05/10/2023   PLT 127 (L) 05/10/2023   NEUTROABS 11.9 (H) 05/10/2023     Urine Studies No results for input(s): "UHGB", "CRYS" in the last 72 hours.  Invalid input(s): "UACOL", "UAPR", "USPG", "UPH", "UTP", "UGL", "UKET", "UBIL", "UNIT", "UROB", "ULEU", "UEPI", "UWBC", "URBC", "UBAC", "CAST", "UCOM", "BILUA"  Basic Metabolic Panel: Recent Labs  Lab 05/10/23 0105  NA 141  K 3.5  CL 111  CO2 23  GLUCOSE 134*  BUN 35*  CREATININE 1.91*  CALCIUM 9.7   GFR Estimated Creatinine Clearance: 21 mL/min (A) (by C-G formula based on SCr of  1.91 mg/dL (H)). Liver Function Tests: Recent Labs  Lab 05/10/23 0105  AST 66*  ALT 37  ALKPHOS 118  BILITOT 2.1*  PROT 4.9*  ALBUMIN 1.9*   Recent Labs  Lab 05/10/23 0105  LIPASE 33   No results for input(s): "AMMONIA" in the last 168 hours. Coagulation profile Recent Labs  Lab 05/10/23 0105  INR 1.7*    CBC: Recent Labs  Lab 05/10/23 0105  WBC 15.1*  NEUTROABS 11.9*  HGB 9.0*  HCT 27.0*  MCV 86.5  PLT 127*   Cardiac Enzymes: No results for input(s): "CKTOTAL", "CKMB", "CKMBINDEX", "TROPONINI" in the last 168 hours. BNP: Invalid input(s): "POCBNP" CBG: Recent Labs  Lab 05/10/23 1610 05/10/23 1804  GLUCAP 162* 162*   D-Dimer No results for input(s): "DDIMER" in the last 72 hours. Hgb A1c No results for input(s): "HGBA1C" in the last 72 hours. Lipid Profile No results for input(s): "CHOL", "HDL", "LDLCALC", "TRIG", "CHOLHDL", "LDLDIRECT" in the last 72 hours. Thyroid function studies No results for input(s): "TSH", "T4TOTAL", "T3FREE", "THYROIDAB" in the last 72 hours.  Invalid input(s): "FREET3" Anemia work up No results for input(s): "VITAMINB12", "FOLATE", "FERRITIN", "TIBC", "IRON", "RETICCTPCT" in the last 72 hours. Microbiology Recent Results (from the past 240 hours)  Resp panel by RT-PCR (RSV, Flu A&B, Covid)  Anterior Nasal Swab     Status: Abnormal   Collection Time: 05/10/23 12:26 AM   Specimen: Anterior Nasal Swab  Result Value Ref Range Status   SARS Coronavirus 2 by RT PCR POSITIVE (A) NEGATIVE Final    Comment: (NOTE) SARS-CoV-2 target nucleic acids are DETECTED.  The SARS-CoV-2 RNA is generally detectable in upper respiratory specimens during the acute phase of infection. Positive results are indicative of the presence of the identified virus, but do not rule out bacterial infection or co-infection with other pathogens not detected by the test. Clinical correlation with patient history and other diagnostic information is  necessary to determine patient infection status. The expected result is Negative.  Fact Sheet for Patients: BloggerCourse.com  Fact Sheet for Healthcare Providers: SeriousBroker.it  This test is not yet approved or cleared by the [place] FDA and  has been authorized for detection and/or diagnosis of SARS-CoV-2 by FDA under an Emergency Use Authorization (EUA).  This EUA will remain in effect (meaning this test can be used) for the duration of  the COVID-19 declaration under Section 564(b)(1) of the A ct, 21 U.S.C. section 360bbb-3(b)(1), unless the authorization is terminated or revoked sooner.     Influenza A by PCR NEGATIVE NEGATIVE Final   Influenza B by PCR NEGATIVE NEGATIVE Final    Comment: (NOTE) The Xpert Xpress SARS-CoV-2/FLU/RSV plus assay is intended as an aid in the diagnosis of influenza from Nasopharyngeal swab specimens and should not be used as a sole basis for treatment. Nasal washings and aspirates are unacceptable for Xpert Xpress SARS-CoV-2/FLU/RSV testing.  Fact Sheet for Patients: BloggerCourse.com  Fact Sheet for Healthcare Providers: SeriousBroker.it  This test is not yet approved or cleared by the [place] FDA and has been authorized for detection and/or diagnosis of SARS-CoV-2 by FDA under an Emergency Use Authorization (EUA). This EUA will remain in effect (meaning this test can be used) for the duration of the COVID-19 declaration under Section 564(b)(1) of the Act, 21 U.S.C. section 360bbb-3(b)(1), unless the authorization is terminated or revoked.     Resp Syncytial Virus by PCR NEGATIVE NEGATIVE Final    Comment: (NOTE) Fact Sheet for Patients: BloggerCourse.com  Fact Sheet for Healthcare Providers: SeriousBroker.it  This test is not yet approved or cleared by the [place] FDA  and has been authorized for detection and/or diagnosis of SARS-CoV-2 by FDA under an Emergency Use Authorization (EUA). This EUA will remain in effect (meaning this test can be used) for the duration of the COVID-19 declaration under Section 564(b)(1) of the Act, 21 U.S.C. section 360bbb-3(b)(1), unless the authorization is terminated or revoked.  Performed at [facility], [address], [place], [place] [address]   Blood culture (routine x 2)     Status: None (Preliminary result)   Collection Time: 05/10/23  3:45 AM   Specimen: BLOOD LEFT ARM  Result Value Ref Range Status   Specimen Description   Final    BLOOD LEFT ARM Performed at [facility], [address], [place], [place] [address]    Special Requests   Final    BOTTLES DRAWN AEROBIC AND ANAEROBIC Blood Culture results may not be optimal due to an inadequate volume of blood received in culture bottles Performed at [facility], [address] [place], [place] [address]    Culture PENDING  Incomplete   Report Status PENDING  Incomplete      Studies:  CT CHEST ABDOMEN PELVIS WO CONTRAST Result  Date: 05/10/2023 CLINICAL DATA:  Abdominal pain. Dyspnea. Rectal bleeding. Dark stools. Positive fetal occult stool card. History of breast cancer. * Tracking Code: BO * EXAM: CT CHEST, ABDOMEN AND PELVIS WITHOUT CONTRAST TECHNIQUE: Multidetector CT imaging of the chest, abdomen and pelvis was performed following the standard protocol without IV contrast. RADIATION DOSE REDUCTION: This exam was performed according to the departmental dose-optimization program which includes automated exposure control, adjustment of the mA and/or kV according to patient size and/or use of iterative reconstruction technique. COMPARISON:  Chest abdomen pelvis CT 10/03/2022 FINDINGS: CT CHEST FINDINGS Cardiovascular: The heart size is normal. No substantial pericardial effusion. Coronary artery calcification is  evident. Moderate atherosclerotic calcification is noted in the wall of the thoracic aorta. Right-sided Port-A-Cath tip is positioned in the mid to distal SVC. Mediastinum/Nodes: No mediastinal lymphadenopathy. No evidence for gross hilar lymphadenopathy although assessment is limited by the lack of intravenous contrast on the current study. The esophagus has normal imaging features. 11 mm short axis left subpectoral lymph node was 9 mm previously. Lungs/Pleura: Diffuse interlobular septal thickening is noted in both lungs with patchy areas of ground-glass and confluent airspace disease bilaterally. Areas of cylindrical bronchiectasis in both lungs are similar to prior with some areas of cystic bronchiectasis in the inferior right lung base. The patient had multiple pulmonary nodules on the previous CT that are essentially obscured by airspace disease on the current exam. Areas of impaction noted in the bronchiectatic airways of the right lower lobe. Musculoskeletal: Both clavicles appear sclerotic, similar to prior. Finding is nonspecific and most likely benign given the symmetry. CT ABDOMEN PELVIS FINDINGS Hepatobiliary: No suspicious focal abnormality in the liver on this study without intravenous contrast. Gallbladder is distended with focal wall thickening and contraction in the fundal region. No calcified gallstones although subtle layering increased density is seen in the dependent lumen suggesting the presence of uncalcified stones. No intrahepatic or extrahepatic biliary dilation. Pancreas: No focal mass lesion. No dilatation of the main duct. No intraparenchymal cyst. No peripancreatic edema. Spleen: No splenomegaly. No suspicious focal mass lesion. Adrenals/Urinary Tract: No adrenal nodule or mass. Similar 2.8 cm water density lesion upper pole left kidney most suggestive of a cyst. Nonobstructing stones are again noted left kidney. Right kidney unremarkable. No evidence for hydroureter. The urinary  bladder appears normal for the degree of distention. Stomach/Bowel: Tiny hiatal hernia. Stomach decompressed. Duodenum is normally positioned as is the ligament of Treitz. No small bowel wall thickening. No small bowel dilatation. The terminal ileum is normal. The appendix is normal. No gross colonic mass. No colonic wall thickening. Vascular/Lymphatic: There is moderate atherosclerotic calcification of the abdominal aorta without aneurysm. There is no gastrohepatic or hepatoduodenal ligament lymphadenopathy. No retroperitoneal or mesenteric lymphadenopathy. No pelvic sidewall lymphadenopathy. Reproductive: The uterus is unremarkable.  There is no adnexal mass. Other: No intraperitoneal free fluid. Musculoskeletal: Diffuse body wall edema evident. No worrisome lytic or sclerotic osseous abnormality. IMPRESSION: 1. Diffuse interlobular septal thickening in both lungs with patchy areas of ground-glass and confluent airspace disease bilaterally. Imaging features are nonspecific and may be related to pulmonary edema or an infectious/inflammatory process. Pulmonary/lymphangitic metastatic involvement is considered less likely. 2. Areas of cylindrical bronchiectasis in both lungs are similar to prior with some areas of cystic bronchiectasis in the inferior right lung base. Areas of impaction noted in the bronchiectatic airways of the right lower lobe. 3. The patient had multiple pulmonary nodules on the previous CT that are essentially obscured by airspace disease on  the current exam. 4. 11 mm short axis left subpectoral lymph node was 9 mm previously. 5. Distended gallbladder with focal wall thickening and contraction in the fundal region. While probably related to adenomyomatosis, follow-up nonemergent outpatient right upper quadrant ultrasound recommended to further evaluate. No calcified gallstones although subtle layering increased density is seen in the dependent lumen suggesting the presence of uncalcified stones.  6. Nonobstructing left renal stones. 7. Diffuse body wall edema. Electronically Signed   By: Kennith Center M.D.   On: 05/10/2023 06:36   DG Chest 2 View Result Date: 05/10/2023 CLINICAL DATA:  Shortness of breath and cough. History of breast cancer. EXAM: CHEST - 2 VIEW COMPARISON:  Radiographs 05/02/2023 and CT chest 12/20/2022 FINDINGS: Right chest wall Port-A-Cath tip in the low SVC. Stable cardiomediastinal silhouette. Diffuse bilateral airspace and interstitial opacities have progressed compared to 05/02/2023. No pleural effusion or pneumothorax. No displaced rib fracture. IMPRESSION: Diffuse bilateral airspace and interstitial opacities have progressed compared to 05/02/2023. This is favored to represent pneumonia. Edema could appear similarly. The known pulmonary nodules are not well demonstrated radiographically. Electronically Signed   By: Minerva Fester M.D.   On: 05/10/2023 01:26    Assessment: 83 y.o. female   Acute hypoxic respiratory failure Bilateral diffuse pneumonitis, secondary to COVID or Enhertu COVID infection Acute on chronic congestive heart failure AKI Rectal bleeding Anemia and thrombocytopenia Metastatic breast cancer to lung, on Enhertu  9. Dementia     Plan:  -I personally reviewed her lab and CT scan images, which showed extensive bilateral pneumonitis, versus pulmonary edema.  This is likely related to COVID or Enhertu, which can cause no pneumonitis. -She is currently on dexamethasone IV 6 mg daily, remdesivir and antibacterial antibiotics, managed by hospitalist team  -I recommend to increase dexamethasone to 6 mg twice daily to cover in HER2 induced pneumonitis  -I discussed CODE STATUS with her daughter Jodene Nam, and I recommended DNR.  She would not be a candidate for more cancer treatment, even if she survives this episode of respiratory failure, due to her advanced age, overall deteriorated performance status.  Her daughter will think about it. -I will  follow-up   Malachy Mood, MD 05/10/2023  6:18 PM

## 2023-05-11 ENCOUNTER — Inpatient Hospital Stay: Admitting: Hematology

## 2023-05-11 DIAGNOSIS — E1122 Type 2 diabetes mellitus with diabetic chronic kidney disease: Secondary | ICD-10-CM | POA: Insufficient documentation

## 2023-05-11 DIAGNOSIS — J9601 Acute respiratory failure with hypoxia: Secondary | ICD-10-CM | POA: Diagnosis not present

## 2023-05-11 DIAGNOSIS — I10 Essential (primary) hypertension: Secondary | ICD-10-CM | POA: Insufficient documentation

## 2023-05-11 LAB — HEMOGLOBIN A1C
Hgb A1c MFr Bld: 6.2 % — ABNORMAL HIGH (ref 4.8–5.6)
Mean Plasma Glucose: 131.24 mg/dL

## 2023-05-11 LAB — GLUCOSE, CAPILLARY
Glucose-Capillary: 150 mg/dL — ABNORMAL HIGH (ref 70–99)
Glucose-Capillary: 152 mg/dL — ABNORMAL HIGH (ref 70–99)
Glucose-Capillary: 121 mg/dL — ABNORMAL HIGH (ref 70–99)
Glucose-Capillary: 173 mg/dL — ABNORMAL HIGH (ref 70–99)
Glucose-Capillary: 161 mg/dL — ABNORMAL HIGH (ref 70–99)

## 2023-05-11 LAB — COMPREHENSIVE METABOLIC PANEL
ALT: 31 U/L (ref 0–44)
AST: 52 U/L — ABNORMAL HIGH (ref 15–41)
Albumin: 1.6 g/dL — ABNORMAL LOW (ref 3.5–5.0)
Alkaline Phosphatase: 113 U/L (ref 38–126)
Anion gap: 11 (ref 5–15)
BUN: 38 mg/dL — ABNORMAL HIGH (ref 8–23)
CO2: 22 mmol/L (ref 22–32)
Calcium: 9.4 mg/dL (ref 8.9–10.3)
Chloride: 112 mmol/L — ABNORMAL HIGH (ref 98–111)
Creatinine, Ser: 1.89 mg/dL — ABNORMAL HIGH (ref 0.44–1.00)
GFR, Estimated: 26 mL/min — ABNORMAL LOW (ref >60)
Glucose, Bld: 142 mg/dL — ABNORMAL HIGH (ref 70–99)
Potassium: 3.2 mmol/L — ABNORMAL LOW (ref 3.5–5.1)
Sodium: 145 mmol/L (ref 135–145)
Total Bilirubin: 1.3 mg/dL — ABNORMAL HIGH (ref 0.0–1.2)
Total Protein: 4.4 g/dL — ABNORMAL LOW (ref 6.5–8.1)

## 2023-05-11 LAB — TROPONIN I (HIGH SENSITIVITY): Troponin I (High Sensitivity): 19 ng/L — ABNORMAL HIGH (ref <18)

## 2023-05-11 LAB — CBC
HCT: 25.5 % — ABNORMAL LOW (ref 36.0–46.0)
Hemoglobin: 8.4 g/dL — ABNORMAL LOW (ref 12.0–15.0)
MCH: 28.6 pg (ref 26.0–34.0)
MCHC: 32.9 g/dL (ref 30.0–36.0)
MCV: 86.7 fL (ref 80.0–100.0)
Platelets: 107 10*3/uL — ABNORMAL LOW (ref 150–400)
RBC: 2.94 MIL/uL — ABNORMAL LOW (ref 3.87–5.11)
RDW: 18.8 % — ABNORMAL HIGH (ref 11.5–15.5)
WBC: 14.9 10*3/uL — ABNORMAL HIGH (ref 4.0–10.5)
nRBC: 0.2 % (ref 0.0–0.2)

## 2023-05-11 LAB — PROCALCITONIN: Procalcitonin: 0.73 ng/mL

## 2023-05-11 MED ORDER — GUAIFENESIN-DM 100-10 MG/5ML PO SYRP: 5.0000 mL | ORAL_SOLUTION | ORAL | Status: DC | PRN

## 2023-05-11 MED ORDER — OXYCODONE HCL 5 MG PO TABS
5.0000 mg | ORAL_TABLET | ORAL | Status: DC | PRN
  Administered 2023-05-15: 5 mg via ORAL
  Filled 2023-05-11: qty 1

## 2023-05-11 MED ORDER — POTASSIUM CHLORIDE CRYS ER 20 MEQ PO TBCR
40.0000 meq | EXTENDED_RELEASE_TABLET | Freq: Two times a day (BID) | ORAL | Status: DC
  Administered 2023-05-11: 40 meq via ORAL
  Filled 2023-05-11: qty 2

## 2023-05-11 MED ORDER — POTASSIUM CHLORIDE 20 MEQ PO PACK
60.0000 meq | PACK | Freq: Once | ORAL | Status: AC
  Administered 2023-05-11: 60 meq via ORAL
  Filled 2023-05-11: qty 3

## 2023-05-11 MED ORDER — PANTOPRAZOLE SODIUM 40 MG PO TBEC
40.0000 mg | DELAYED_RELEASE_TABLET | Freq: Two times a day (BID) | ORAL | Status: DC
  Administered 2023-05-11 – 2023-05-16 (×10): 40 mg via ORAL
  Filled 2023-05-11 (×11): qty 1

## 2023-05-11 MED ORDER — MORPHINE SULFATE (PF) 4 MG/ML IV SOLN: 4.0000 mg | INTRAVENOUS | Status: DC | PRN

## 2023-05-11 MED ORDER — GUAIFENESIN ER 600 MG PO TB12
600.0000 mg | ORAL_TABLET | Freq: Two times a day (BID) | ORAL | Status: DC | PRN
Start: 2023-05-11 — End: 2023-05-16

## 2023-05-11 MED ORDER — BOOST / RESOURCE BREEZE PO LIQD CUSTOM
1.0000 | Freq: Two times a day (BID) | ORAL | Status: DC
  Administered 2023-05-11 – 2023-05-16 (×4): 1 via ORAL

## 2023-05-11 NOTE — Assessment & Plan Note (Addendum)
 No residual deficits. - Stop Lipitor and aspirin at discharge

## 2023-05-11 NOTE — Assessment & Plan Note (Addendum)
 Pneumonia due to COVID-19 Possible Trastuzumab-related lung toxicity Presented with dyspnea tachypnea, hypoxia to 82%, requiring here 6-8L to maintain SpO2 90%.  Completed 3 days remdesivir, and 5 days Rocephin and azithromycin.  Weaned to 2 L supplemental oxygen and breathing is improved - Continue Decadron 6 mg twice daily for 2 weeks, then taper 3 mg/week over the next 4 weeks -Continue oxygen at discharge

## 2023-05-11 NOTE — Progress Notes (Signed)
 Subjective: She complains about various chest pain.  Objective: Vital signs in last 24 hours: Temp:  [97.5 F (36.4 C)-98.3 F (36.8 C)] 97.5 F (36.4 C) (03/06 1202) Pulse Rate:  [76-93] 76 (03/06 1202) Resp:  [16-23] 20 (03/06 1202) BP: (122-161)/(63-77) 139/69 (03/06 1202) SpO2:  [90 %-95 %] 93 % (03/06 1600) Weight:  [66.8 kg] 66.8 kg (03/06 0500) Last BM Date : 08/10/23  Intake/Output from previous day: 03/05 0701 - 03/06 0700 In: 344.3 [IV Piggyback:344.3] Out: -  Intake/Output this shift: Total I/O In: 100 [IV Piggyback:100] Out: 400 [Urine:400]  General appearance: alert and labored breathing Resp: some mild wheezing Cardio: RRR GI: minimal mid abdominal pain Extremities: edematous, bandaged  Lab Results: Recent Labs    05/10/23 0105 05/11/23 0410  WBC 15.1* 14.9*  HGB 9.0* 8.4*  HCT 27.0* 25.5*  PLT 127* 107*   BMET Recent Labs    05/10/23 0105 05/11/23 0410  NA 141 145  K 3.5 3.2*  CL 111 112*  CO2 23 22  GLUCOSE 134* 142*  BUN 35* 38*  CREATININE 1.91* 1.89*  CALCIUM 9.7 9.4   LFT Recent Labs    05/11/23 0410  PROT 4.4*  ALBUMIN 1.6*  AST 52*  ALT 31  ALKPHOS 113  BILITOT 1.3*   PT/INR Recent Labs    05/10/23 0105  LABPROT 20.4*  INR 1.7*   Hepatitis Panel No results for input(s): "HEPBSAG", "HCVAB", "HEPAIGM", "HEPBIGM" in the last 72 hours. C-Diff No results for input(s): "CDIFFTOX" in the last 72 hours. Fecal Lactopherrin No results for input(s): "FECLLACTOFRN" in the last 72 hours.  Studies/Results: CT CHEST ABDOMEN PELVIS WO CONTRAST Result Date: 05/10/2023 CLINICAL DATA:  Abdominal pain. Dyspnea. Rectal bleeding. Dark stools. Positive fetal occult stool card. History of breast cancer. * Tracking Code: BO * EXAM: CT CHEST, ABDOMEN AND PELVIS WITHOUT CONTRAST TECHNIQUE: Multidetector CT imaging of the chest, abdomen and pelvis was performed following the standard protocol without IV contrast. RADIATION DOSE REDUCTION:  This exam was performed according to the departmental dose-optimization program which includes automated exposure control, adjustment of the mA and/or kV according to patient size and/or use of iterative reconstruction technique. COMPARISON:  Chest abdomen pelvis CT 10/03/2022 FINDINGS: CT CHEST FINDINGS Cardiovascular: The heart size is normal. No substantial pericardial effusion. Coronary artery calcification is evident. Moderate atherosclerotic calcification is noted in the wall of the thoracic aorta. Right-sided Port-A-Cath tip is positioned in the mid to distal SVC. Mediastinum/Nodes: No mediastinal lymphadenopathy. No evidence for gross hilar lymphadenopathy although assessment is limited by the lack of intravenous contrast on the current study. The esophagus has normal imaging features. 11 mm short axis left subpectoral lymph node was 9 mm previously. Lungs/Pleura: Diffuse interlobular septal thickening is noted in both lungs with patchy areas of ground-glass and confluent airspace disease bilaterally. Areas of cylindrical bronchiectasis in both lungs are similar to prior with some areas of cystic bronchiectasis in the inferior right lung base. The patient had multiple pulmonary nodules on the previous CT that are essentially obscured by airspace disease on the current exam. Areas of impaction noted in the bronchiectatic airways of the right lower lobe. Musculoskeletal: Both clavicles appear sclerotic, similar to prior. Finding is nonspecific and most likely benign given the symmetry. CT ABDOMEN PELVIS FINDINGS Hepatobiliary: No suspicious focal abnormality in the liver on this study without intravenous contrast. Gallbladder is distended with focal wall thickening and contraction in the fundal region. No calcified gallstones although subtle layering increased density is seen  in the dependent lumen suggesting the presence of uncalcified stones. No intrahepatic or extrahepatic biliary dilation. Pancreas: No  focal mass lesion. No dilatation of the main duct. No intraparenchymal cyst. No peripancreatic edema. Spleen: No splenomegaly. No suspicious focal mass lesion. Adrenals/Urinary Tract: No adrenal nodule or mass. Similar 2.8 cm water density lesion upper pole left kidney most suggestive of a cyst. Nonobstructing stones are again noted left kidney. Right kidney unremarkable. No evidence for hydroureter. The urinary bladder appears normal for the degree of distention. Stomach/Bowel: Tiny hiatal hernia. Stomach decompressed. Duodenum is normally positioned as is the ligament of Treitz. No small bowel wall thickening. No small bowel dilatation. The terminal ileum is normal. The appendix is normal. No gross colonic mass. No colonic wall thickening. Vascular/Lymphatic: There is moderate atherosclerotic calcification of the abdominal aorta without aneurysm. There is no gastrohepatic or hepatoduodenal ligament lymphadenopathy. No retroperitoneal or mesenteric lymphadenopathy. No pelvic sidewall lymphadenopathy. Reproductive: The uterus is unremarkable.  There is no adnexal mass. Other: No intraperitoneal free fluid. Musculoskeletal: Diffuse body wall edema evident. No worrisome lytic or sclerotic osseous abnormality. IMPRESSION: 1. Diffuse interlobular septal thickening in both lungs with patchy areas of ground-glass and confluent airspace disease bilaterally. Imaging features are nonspecific and may be related to pulmonary edema or an infectious/inflammatory process. Pulmonary/lymphangitic metastatic involvement is considered less likely. 2. Areas of cylindrical bronchiectasis in both lungs are similar to prior with some areas of cystic bronchiectasis in the inferior right lung base. Areas of impaction noted in the bronchiectatic airways of the right lower lobe. 3. The patient had multiple pulmonary nodules on the previous CT that are essentially obscured by airspace disease on the current exam. 4. 11 mm short axis left  subpectoral lymph node was 9 mm previously. 5. Distended gallbladder with focal wall thickening and contraction in the fundal region. While probably related to adenomyomatosis, follow-up nonemergent outpatient right upper quadrant ultrasound recommended to further evaluate. No calcified gallstones although subtle layering increased density is seen in the dependent lumen suggesting the presence of uncalcified stones. 6. Nonobstructing left renal stones. 7. Diffuse body wall edema. Electronically Signed   By: Kennith Center M.D.   On: 05/10/2023 06:36   DG Chest 2 View Result Date: 05/10/2023 CLINICAL DATA:  Shortness of breath and cough. History of breast cancer. EXAM: CHEST - 2 VIEW COMPARISON:  Radiographs 05/02/2023 and CT chest 12/20/2022 FINDINGS: Right chest wall Port-A-Cath tip in the low SVC. Stable cardiomediastinal silhouette. Diffuse bilateral airspace and interstitial opacities have progressed compared to 05/02/2023. No pleural effusion or pneumothorax. No displaced rib fracture. IMPRESSION: Diffuse bilateral airspace and interstitial opacities have progressed compared to 05/02/2023. This is favored to represent pneumonia. Edema could appear similarly. The known pulmonary nodules are not well demonstrated radiographically. Electronically Signed   By: Minerva Fester M.D.   On: 05/10/2023 01:26    Medications: Scheduled:  atorvastatin  20 mg Oral Daily   dexamethasone (DECADRON) injection  6 mg Intravenous Q12H   feeding supplement  1 Container Oral BID BM   insulin aspart  0-20 Units Subcutaneous TID WC   pantoprazole  40 mg Oral BID AC   Continuous:  azithromycin 500 mg (05/11/23 1250)   cefTRIAXone (ROCEPHIN)  IV 2 g (05/11/23 1517)   remdesivir 100 mg in sodium chloride 0.9 % 100 mL IVPB 100 mg (05/11/23 1012)    Assessment/Plan: 1) Hematochezia. 2) Anemia. 3) Heme positive stool. 4) Respiratory compromise. 5) Metastatic breast cancer.   During my evaluation today she  was not  able to complete a sentence without being SOB on 2-4 liters of oxygen.  She is very frail appearing.  Her HGB is relatively stable and it is unknown if she had any further bloody bowel movements.  I had a long discussion with her daughter about pursuing further work up with a colonoscopy. She is very high risk from cardiopulmonary complications with a colonoscopy.  Also, it does not appear that she can even complete a prep for the procedure.  Her daughter agreed that she is too high risk for a procedure of questionable benefit.  Her CTA on admission was negative for any gross colonic abnormalities.  Plan: 1) No further GI work up at this time. 2) Continue with supportive care. 3) Signing off.  LOS: 1 day   Eliza Green D 05/11/2023, 5:36 PM

## 2023-05-11 NOTE — Assessment & Plan Note (Signed)
 Hgb stable.

## 2023-05-11 NOTE — Assessment & Plan Note (Addendum)
-   Stop Lipitor at discharge

## 2023-05-11 NOTE — Progress Notes (Signed)
 Heart Failure Navigator Progress Note  Assessed for Heart & Vascular TOC clinic readiness.  Patient does not meet criteria due to per MD note patient with Metastatic Cancer, EF 60-65%.NO HF TOC .   Navigator will sign off at this time.   Rhae Hammock, BSN, Scientist, clinical (histocompatibility and immunogenetics) Only

## 2023-05-11 NOTE — Assessment & Plan Note (Addendum)
-   Continue nutrition supplements

## 2023-05-11 NOTE — Assessment & Plan Note (Signed)
 Platelets stable, no bleeding

## 2023-05-11 NOTE — Progress Notes (Signed)
 Initial Nutrition Assessment  DOCUMENTATION CODES:   Not applicable  INTERVENTION:  - Carb Modified diet. Remove Heart Healthy diet restriction to avoid restricting intake.  - Boost Breeze po TID, each supplement provides 250 kcal and 9 grams of protein - Encourage intake at all meals and of supplements.  - Monitor weight trends.    NUTRITION DIAGNOSIS:   Increased nutrient needs related to chronic illness as evidenced by estimated needs.  GOAL:   Patient will meet greater than or equal to 90% of their needs  MONITOR:   PO intake, Supplement acceptance, Weight trends  REASON FOR ASSESSMENT:   Consult Assessment of nutrition requirement/status  ASSESSMENT:   83 y.o. F with breast CA metastatic to lung and lymph nodes who presented with dyspnea, found to have COVID and trastuzumab related pulmonitis.   Patient in bed working with phlebotomist. Daughter presented to room after initial introduction.   Daughter provided most information, patient appeared somewhat confused.   Daughter reports has been losing weight since cancer treatment but this has accelerated over the past few week. Per EMR, weight without significant changes over the past year until the past 1 month. Patient was weighed at 158# on 2/25 and weighed this admission at 147#. This is an 11# or 7% weight loss in 1.5 weeks, which is significant if accurate.   Daughter reports the patient is picky at home. Hasn't been eating as much since cancer treatment. No nutrition supplements at home but daughter was about to start her on some.   Patient currently on a HH/CM diet. No intake yet documented. Will liberalize to just Carb Modified to avoid restricting intake.  Patient reports having tried Ensure today and not liking it. Agreeable to try Boost Breeze instead.   If note, oncology following patient this admission and note that she is not a candidate for further cancer treatment and recommend DNR. Palliative care  consult placed, recs pending.   Medications reviewed and include: Decadron  Labs reviewed:  K+ 3.2 Creatinine 1.89 HA1C 6.2 Blood Glucose 150-170 x24 hours   NUTRITION - FOCUSED PHYSICAL EXAM:  Flowsheet Row Most Recent Value  Orbital Region No depletion  Upper Arm Region No depletion  Thoracic and Lumbar Region Unable to assess  Buccal Region No depletion  Temple Region Mild depletion  Clavicle Bone Region No depletion  Clavicle and Acromion Bone Region Mild depletion  Scapular Bone Region Unable to assess  Dorsal Hand No depletion  Patellar Region No depletion  Anterior Thigh Region No depletion  Posterior Calf Region No depletion  Edema (RD Assessment) None  Hair Reviewed  Eyes Reviewed  Mouth Reviewed  [wears dentures]  Skin Reviewed  Nails Reviewed       Diet Order:   Diet Order             Diet Carb Modified Fluid consistency: Thin; Room service appropriate? Yes  Diet effective now                   EDUCATION NEEDS:  Education needs have been addressed  Skin:  Skin Assessment: Reviewed RN Assessment  Last BM:  3/5  Height:  Ht Readings from Last 1 Encounters:  05/09/23 5\' 4"  (1.626 m)   Weight:  Wt Readings from Last 1 Encounters:  05/11/23 66.8 kg    BMI:  Body mass index is 25.28 kg/m.  Estimated Nutritional Needs:  Kcal:  1800-2000 kcals Protein:  80-100 grams Fluid:  >/= 1.8L    [facility]  RD, LDN Contact via Secure Chat.

## 2023-05-11 NOTE — Progress Notes (Signed)
 Clinical/Bedside Swallow Evaluation Patient Details  Name: Christy [name] MRN: [id number] Date of Birth: 01/22/1941  Today's Date: 05/11/2023 Time: SLP Start Time (ACUTE ONLY): 1615 SLP Stop Time (ACUTE ONLY): 1650 SLP Time Calculation (min) (ACUTE ONLY): 35 min  Past Medical History:  Past Medical History:  Diagnosis Date   Diabetes mellitus without complication (HCC)    Hyperlipidemia    Hypertension    Past Surgical History:  Past Surgical History:  Procedure Laterality Date   FOOT FRACTURE SURGERY Right    IR IMAGING GUIDED PORT INSERTION  09/22/2020   IR RADIOLOGIST EVAL & MGMT  09/25/2020   HPI:  83 y.o. F with breast CA metastatic to lung and lymph nodes who presented with dyspnea, found to have COVID and trastuzumab related pulmonitis.  Patient is well-known to me, under my care for her metastatic breast cancer.  She is on Enhertu, last dose about 3 weeks ago.  She was recently evaluated in my office for progressive weakness, anasarca, dyspnea on exertion and new onset hypoxia.  MRI 05/2022 No evidence of metastatic disease or acute abnormality. 2. Prior bilateral basal ganglia lacunar infarcts.  Daughter reports has been losing weight since cancer treatment but this has accelerated over the past few week.  Per EMR, weight without significant changes over the past year until the past 1 month. Patient was weighed at 158# on 2/25 and weighed this admission at 147#. This is an 11# or 7% weight loss in 1.5 weeks, which is significant if accurate. 03/2022 There is moderate spinal canal stenosis at C3-C4 and moderate to severe spinal canal stenosis at C4-C5, C5-C6.    Assessment / Plan / Recommendation  Clinical Impression  (P) Patient presents with minimal potential cognitve based dysphagia, potential early stage oral candidiasis but clinially intact pharyngeal swallow c/b some white area on lateral tongue and bilateral buccal region that did not fully clear with dental brushing.     ?  Oral candidiasis?  She endorses discomfort in her esophagus *pointing distally* starting last May with treatment.  Also reports issue with pill sticking in esophagus and causing pain- ? if she may have some esophagitis, ? GER.  Advised pt that she may have some discomfort when consuming acidic, spicy items, etc if she is having pain.    Pt also reports recent fall  with dentures in her mouth that caused tissue breakdown - noted appearanceo of healing on lower gums. She only uses upper dentures, thus requested her daughter to order her foods to choose items that pt can more easily swallow.   No focal CN deficits apparent and pt able to feed herself.   Per  RN, pt is now on a PPI.    Advised daughter and pt monitor pt's swallowing and follow up with MD as needed.   Pt observed consuming gingerale, water, icecream and graham crackers. Oral holding with gingerale - with delayed swallow - cued to swallow - potentially anticipating discomfort.  Otherwise adequate oral clearance and swallow.    NO S/S of aspiration.  SLP will sign off. Thanks for this order. SLP Visit Diagnosis: (P) Dysphagia, unspecified (R13.10)    Aspiration Risk  Moderate aspiration risk    Diet Recommendation Regular;Thin liquid    Liquid Administration via: Cup;Straw Medication Administration: Whole meds with liquid Supervision: Patient able to self feed Compensations: Slow rate;Small sips/bites (start all intake liquids) Postural Changes: Seated upright at 90 degrees;Remain upright for at least 30 minutes after po intake  Other  Recommendations Oral Care Recommendations: Oral care BID    Recommendations for follow up therapy are one component of a multi-disciplinary discharge planning process, led by the attending physician.  Recommendations may be updated based on patient status, additional functional criteria and insurance authorization.  Follow up Recommendations No SLP follow up      Assistance Recommended at  Discharge  N/a  Functional Status Assessment Patient has not had a recent decline in their functional status  Frequency and Duration   N/a         Prognosis Prognosis for improved oropharyngeal function: (P) Good Barriers to Reach Goals: (P) Time post onset      Swallow Study   General Date of Onset: 05/11/23 HPI: 83 y.o. F with breast CA metastatic to lung and lymph nodes who presented with dyspnea, found to have COVID and trastuzumab related pulmonitis.  Patient is well-known to me, under my care for her metastatic breast cancer.  She is on Enhertu, last dose about 3 weeks ago.  She was recently evaluated in my office for progressive weakness, anasarca, dyspnea on exertion and new onset hypoxia.  MRI 05/2022 No evidence of metastatic disease or acute abnormality. 2. Prior bilateral basal ganglia lacunar infarcts.  Daughter reports has been losing weight since cancer treatment but this has accelerated over the past few week.  Per EMR, weight without significant changes over the past year until the past 1 month. Patient was weighed at 158# on 2/25 and weighed this admission at 147#. This is an 11# or 7% weight loss in 1.5 weeks, which is significant if accurate. 03/2022 There is moderate spinal canal stenosis at C3-C4 and moderate to severe spinal canal stenosis at C4-C5, C5-C6. Type of Study: Bedside Swallow Evaluation Previous Swallow Assessment: none stated Diet Prior to this Study: Regular;Thin liquids (Level 0) Temperature Spikes Noted: Yes Respiratory Status: Nasal cannula History of Recent Intubation: No Behavior/Cognition: Alert;Cooperative;Pleasant mood Oral Cavity Assessment: Within Functional Limits Oral Care Completed by SLP: No Oral Cavity - Dentition: Edentulous;Dentures, top (pt has sores on lower jaw) Vision: Functional for self-feeding Self-Feeding Abilities: Able to feed self Patient Positioning: Upright in bed Baseline Vocal Quality: Normal Volitional Cough:  Strong Volitional Swallow: Able to elicit    Oral/Motor/Sensory Function Overall Oral Motor/Sensory Function: Within functional limits   Ice Chips Ice chips: Not tested   Thin Liquid Thin Liquid: Within functional limits Presentation: Straw;Self Fed Other Comments: delayed swallow    Nectar Thick Nectar Thick Liquid: Not tested   Honey Thick Honey Thick Liquid: Not tested   Puree Puree: Within functional limits Presentation: Self Fed;Spoon   Solid     Solid: Within functional limits Presentation: Self Fed Other Comments: WFL      Chales Abrahams 05/11/2023,5:47 PM  Rolena Infante, MS [facility] SLP Acute Rehab Services Office [phone]

## 2023-05-11 NOTE — Hospital Course (Signed)
 83 y.o. F with breast CA metastatic to lung and lymph nodes who presented with dyspnea, found to have COVID and trastuzumab related pulmonitis.

## 2023-05-11 NOTE — Assessment & Plan Note (Addendum)
 Evalauated by GI.  They suspect rectal hemorrhoid.  No further bleeding.   - Stop aspirin

## 2023-05-11 NOTE — Assessment & Plan Note (Addendum)
 With this lung reaction, Oncology have recommended no further chemo.  Oncology and palliative care met with the patient and daughter, family elected to enroll hospice at the time of discharge.   Discussed with Hospice today, they will have equipment in the home this evening or tomorrow morning

## 2023-05-11 NOTE — Plan of Care (Signed)
   Problem: Education: Goal: Knowledge of risk factors and measures for prevention of condition will improve Outcome: Progressing   Problem: Coping: Goal: Psychosocial and spiritual needs will be supported Outcome: Progressing   Problem: Respiratory: Goal: Will maintain a patent airway Outcome: Progressing Goal: Complications related to the disease process, condition or treatment will be avoided or minimized Outcome: Progressing

## 2023-05-11 NOTE — Progress Notes (Signed)
  Progress Note   Patient: Christy [name] WUJ:[id number] DOB: [birth year] DOA: 05/09/2023     1 DOS: the patient was seen and examined on 05/11/2023 at 10:10AM      Brief hospital course: 83 y.o. F with breast CA metastatic to lung and lymph nodes who presented with dyspnea, found to have COVID and trastuzumab related pulmonitis.     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 - Continue Decadron 6 mg BID IV - Continue remdesivir - IS and flutter - Continue antibiotics, trend procal and blood cultures, low threshold to d/c  Malignant neoplasm of upper-outer quadrant of female breast (HCC) With this lung reaction, Oncology have recommended no further chemo.  - Consult Oncology, appreciate cares - Consult Palliative Care  Essential hypertension BP controlled - Hold olmesartan  Type 2 diabetes mellitus with chronic kidney disease stage IIIa, without long-term current use of insulin (HCC) Glucose stable - Hold metformin - Continue SS corrections  Normocytic anemia Hgb stable  Hyperlipidemia - Continue Lipitor  Rectal bleed Evalauated by GI.  They suspect rectal hemorrhoid.  No further bleeding.   - Hold aspirin  Thrombocytopenia (HCC) Platelets stable, no bleeding  AKI (acute kidney injury) (HCC) Baseline Cr 1.3.  Cr here up to 1.9, stable overnight - Hold olmesartan  Protein-calorie malnutrition, severe (HCC) - Consult dietitian - Continue nutrition supplements  Cerebrovascular disease No residual deficits. - Continue Lipitor - Hold aspirin  Heart failure ruled out  Mild cognitive impairment Appears to be at baseline mentally - Delirium precautions  Hypokalemia - Supplement K           Subjective: Paitent with productive cough, shortness of breath, fatigue.  Also a lot of heart burn, indigestion, not worse with eating. Unable to exert hersfel.  No vomiting.       Physical Exam: BP 139/69 (BP Location: Left Arm)   Pulse 76   Temp (!) 97.5 F (36.4 C) (Oral)   Resp 20   Ht 5\' 4"  (1.626 m)   Wt 66.8 kg   SpO2 93%   BMI 25.28 kg/m   Elderly adult female, lying in bed, interactive and appropriate RRR, no murmurs, no pitting edema in the extremities Respiratory rate increased, lung sounds with wheezing and rales bilaterally Abdomen soft no tenderness palpation, no guarding Attentive to questions, affect anxious, judgment and insight appear impaired but at baseline, face symmetric, speech fluent, moves all extremities with equal strength and coordination, severe generalized weakness   Data Reviewed: Echocardiogram from earlier this month shows normal EF, normal valves Hemoglobin A1c 6.2% CBC shows white count down to 14, hemoglobin slightly down to 8.4 Platelets 107K CBC shows creatinine stable at 1.89, potassium down to 3.2 Procalcitonin 0.7 brain natretic peptide 150, troponin low and flat, doubt ischemia Total bilirubin down to 1.3  Family Communication: Daughter at bedside,     Disposition: Status is: Inpatient discussed goals of care, family aware, no further chemo is planned.  We will try to treat the treatable in the hospital with remdesivir, but if we can wean O2 and symptoms are tolerable, she would be interestted to take her mother home for end of life care        Author: Alberteen Sam, MD 05/11/2023 2:06 PM  For on call review [email or web address].

## 2023-05-11 NOTE — Assessment & Plan Note (Addendum)
 Cr up to 1.9, unchanged despite fluids and holding ARB - Stop olmesartan

## 2023-05-11 NOTE — Assessment & Plan Note (Signed)
 Glucose stable - Hold metformin - Continue SS corrections

## 2023-05-11 NOTE — Assessment & Plan Note (Deleted)
 -  Continue Lipitor

## 2023-05-11 NOTE — Assessment & Plan Note (Signed)
 BP controlled - Hold olmesartan

## 2023-05-12 ENCOUNTER — Other Ambulatory Visit: Payer: Self-pay

## 2023-05-12 DIAGNOSIS — Z7189 Other specified counseling: Secondary | ICD-10-CM

## 2023-05-12 DIAGNOSIS — C50412 Malignant neoplasm of upper-outer quadrant of left female breast: Secondary | ICD-10-CM

## 2023-05-12 DIAGNOSIS — U071 COVID-19: Secondary | ICD-10-CM | POA: Diagnosis not present

## 2023-05-12 DIAGNOSIS — J1282 Pneumonia due to coronavirus disease 2019: Secondary | ICD-10-CM

## 2023-05-12 DIAGNOSIS — Z515 Encounter for palliative care: Secondary | ICD-10-CM | POA: Diagnosis not present

## 2023-05-12 DIAGNOSIS — J9601 Acute respiratory failure with hypoxia: Secondary | ICD-10-CM | POA: Diagnosis not present

## 2023-05-12 DIAGNOSIS — Z171 Estrogen receptor negative status [ER-]: Secondary | ICD-10-CM

## 2023-05-12 LAB — COMPREHENSIVE METABOLIC PANEL
ALT: 34 U/L (ref 0–44)
AST: 63 U/L — ABNORMAL HIGH (ref 15–41)
Albumin: 1.6 g/dL — ABNORMAL LOW (ref 3.5–5.0)
Alkaline Phosphatase: 124 U/L (ref 38–126)
Anion gap: 11 (ref 5–15)
BUN: 46 mg/dL — ABNORMAL HIGH (ref 8–23)
CO2: 23 mmol/L (ref 22–32)
Calcium: 9.1 mg/dL (ref 8.9–10.3)
Chloride: 110 mmol/L (ref 98–111)
Creatinine, Ser: 1.96 mg/dL — ABNORMAL HIGH (ref 0.44–1.00)
GFR, Estimated: 25 mL/min — ABNORMAL LOW (ref >60)
Glucose, Bld: 153 mg/dL — ABNORMAL HIGH (ref 70–99)
Potassium: 4.1 mmol/L (ref 3.5–5.1)
Sodium: 144 mmol/L (ref 135–145)
Total Bilirubin: 1.3 mg/dL — ABNORMAL HIGH (ref 0.0–1.2)
Total Protein: 4.4 g/dL — ABNORMAL LOW (ref 6.5–8.1)

## 2023-05-12 LAB — CBC
HCT: 25.3 % — ABNORMAL LOW (ref 36.0–46.0)
Hemoglobin: 7.9 g/dL — ABNORMAL LOW (ref 12.0–15.0)
MCH: 27.7 pg (ref 26.0–34.0)
MCHC: 31.2 g/dL (ref 30.0–36.0)
MCV: 88.8 fL (ref 80.0–100.0)
Platelets: 106 10*3/uL — ABNORMAL LOW (ref 150–400)
RBC: 2.85 MIL/uL — ABNORMAL LOW (ref 3.87–5.11)
RDW: 19.3 % — ABNORMAL HIGH (ref 11.5–15.5)
WBC: 20.1 10*3/uL — ABNORMAL HIGH (ref 4.0–10.5)
nRBC: 0.1 % (ref 0.0–0.2)

## 2023-05-12 LAB — GLUCOSE, CAPILLARY
Glucose-Capillary: 146 mg/dL — ABNORMAL HIGH (ref 70–99)
Glucose-Capillary: 170 mg/dL — ABNORMAL HIGH (ref 70–99)
Glucose-Capillary: 124 mg/dL — ABNORMAL HIGH (ref 70–99)
Glucose-Capillary: 141 mg/dL — ABNORMAL HIGH (ref 70–99)

## 2023-05-12 LAB — PROCALCITONIN: Procalcitonin: 0.57 ng/mL

## 2023-05-12 NOTE — Progress Notes (Signed)
 Christy [name]   DOB:September 13, 1940   XB#:[id number]   NUU#:[id number]  Oncology f/u   Subjective: Patient has dyspnea with minimal exertion, such as talking.  She is on nasal cannula 3 to 4 L/min.  She has low appetite, has not eating much.     Objective:  Vitals:   05/12/23 0535 05/12/23 1100  BP: (!) 156/70   Pulse: 82   Resp: 17   Temp: 97.6 F (36.4 C)   SpO2: 97% 97%    Body mass index is 26.79 kg/m.  Intake/Output Summary (Last 24 hours) at 05/12/2023 1242 Last data filed at 05/12/2023 0600 Gross per 24 hour  Intake 400 ml  Output 600 ml  Net -200 ml     Sclerae unicteric  Diffuse edema in legs and arm  Oropharynx clear  No peripheral adenopathy  MSK no focal spinal tenderness, no peripheral edema  Neuro nonfocal    CBG (last 3)  Recent Labs    05/11/23 2222 05/12/23 0752 05/12/23 1133  GLUCAP 161* 146* 170*     Labs:  Lab Results  Component Value Date   WBC 20.1 (H) 05/12/2023   HGB 7.9 (L) 05/12/2023   HCT 25.3 (L) 05/12/2023   MCV 88.8 05/12/2023   PLT 106 (L) 05/12/2023   NEUTROABS 11.9 (H) 05/10/2023     Urine Studies No results for input(s): "UHGB", "CRYS" in the last 72 hours.  Invalid input(s): "UACOL", "UAPR", "USPG", "UPH", "UTP", "UGL", "UKET", "UBIL", "UNIT", "UROB", "ULEU", "UEPI", "UWBC", "URBC", "UBAC", "CAST", "UCOM", "BILUA"  Basic Metabolic Panel: Recent Labs  Lab 05/10/23 0105 05/11/23 0410 05/12/23 0351  NA 141 145 144  K 3.5 3.2* 4.1  CL 111 112* 110  CO2 23 22 23   GLUCOSE 134* 142* 153*  BUN 35* 38* 46*  CREATININE 1.91* 1.89* 1.96*  CALCIUM 9.7 9.4 9.1   GFR Estimated Creatinine Clearance: 21 mL/min (A) (by C-G formula based on SCr of 1.96 mg/dL (H)). Liver Function Tests: Recent Labs  Lab 05/10/23 0105 05/11/23 0410 05/12/23 0351  AST 66* 52* 63*  ALT 37 31 34  ALKPHOS 118 113 124  BILITOT 2.1* 1.3* 1.3*  PROT 4.9* 4.4* 4.4*  ALBUMIN 1.9* 1.6* 1.6*   Recent Labs  Lab 05/10/23 0105  LIPASE 33   No  results for input(s): "AMMONIA" in the last 168 hours. Coagulation profile Recent Labs  Lab 05/10/23 0105  INR 1.7*    CBC: Recent Labs  Lab 05/10/23 0105 05/11/23 0410 05/12/23 0351  WBC 15.1* 14.9* 20.1*  NEUTROABS 11.9*  --   --   HGB 9.0* 8.4* 7.9*  HCT 27.0* 25.5* 25.3*  MCV 86.5 86.7 88.8  PLT 127* 107* 106*   Cardiac Enzymes: No results for input(s): "CKTOTAL", "CKMB", "CKMBINDEX", "TROPONINI" in the last 168 hours. BNP: Invalid input(s): "POCBNP" CBG: Recent Labs  Lab 05/11/23 1725 05/11/23 2121 05/11/23 2222 05/12/23 0752 05/12/23 1133  GLUCAP 121* 173* 161* 146* 170*   D-Dimer No results for input(s): "DDIMER" in the last 72 hours. Hgb A1c Recent Labs    05/11/23 0410  HGBA1C 6.2*   Lipid Profile No results for input(s): "CHOL", "HDL", "LDLCALC", "TRIG", "CHOLHDL", "LDLDIRECT" in the last 72 hours. Thyroid function studies No results for input(s): "TSH", "T4TOTAL", "T3FREE", "THYROIDAB" in the last 72 hours.  Invalid input(s): "FREET3" Anemia work up No results for input(s): "VITAMINB12", "FOLATE", "FERRITIN", "TIBC", "IRON", "RETICCTPCT" in the last 72 hours. Microbiology Recent Results (from the past 240 hours)  Resp panel by RT-PCR (  RSV, Flu A&B, Covid) Anterior Nasal Swab     Status: Abnormal   Collection Time: 05/10/23 12:26 AM   Specimen: Anterior Nasal Swab  Result Value Ref Range Status   SARS Coronavirus 2 by RT PCR POSITIVE (A) NEGATIVE Final    Comment: (NOTE) SARS-CoV-2 target nucleic acids are DETECTED.  The SARS-CoV-2 RNA is generally detectable in upper respiratory specimens during the acute phase of infection. Positive results are indicative of the presence of the identified virus, but do not rule out bacterial infection or co-infection with other pathogens not detected by the test. Clinical correlation with patient history and other diagnostic information is necessary to determine patient infection status. The expected  result is Negative.  Fact Sheet for Patients: BloggerCourse.com  Fact Sheet for Healthcare Providers: SeriousBroker.it  This test is not yet approved or cleared by the [place] FDA and  has been authorized for detection and/or diagnosis of SARS-CoV-2 by FDA under an Emergency Use Authorization (EUA).  This EUA will remain in effect (meaning this test can be used) for the duration of  the COVID-19 declaration under Section 564(b)(1) of the A ct, 21 U.S.C. section 360bbb-3(b)(1), unless the authorization is terminated or revoked sooner.     Influenza A by PCR NEGATIVE NEGATIVE Final   Influenza B by PCR NEGATIVE NEGATIVE Final    Comment: (NOTE) The Xpert Xpress SARS-CoV-2/FLU/RSV plus assay is intended as an aid in the diagnosis of influenza from Nasopharyngeal swab specimens and should not be used as a sole basis for treatment. Nasal washings and aspirates are unacceptable for Xpert Xpress SARS-CoV-2/FLU/RSV testing.  Fact Sheet for Patients: BloggerCourse.com  Fact Sheet for Healthcare Providers: SeriousBroker.it  This test is not yet approved or cleared by the [place] FDA and has been authorized for detection and/or diagnosis of SARS-CoV-2 by FDA under an Emergency Use Authorization (EUA). This EUA will remain in effect (meaning this test can be used) for the duration of the COVID-19 declaration under Section 564(b)(1) of the Act, 21 U.S.C. section 360bbb-3(b)(1), unless the authorization is terminated or revoked.     Resp Syncytial Virus by PCR NEGATIVE NEGATIVE Final    Comment: (NOTE) Fact Sheet for Patients: BloggerCourse.com  Fact Sheet for Healthcare Providers: SeriousBroker.it  This test is not yet approved or cleared by the [place] FDA and has been authorized for detection and/or diagnosis of  SARS-CoV-2 by FDA under an Emergency Use Authorization (EUA). This EUA will remain in effect (meaning this test can be used) for the duration of the COVID-19 declaration under Section 564(b)(1) of the Act, 21 U.S.C. section 360bbb-3(b)(1), unless the authorization is terminated or revoked.  Performed at [facility], [address], [place], [place] [address]   Blood culture (routine x 2)     Status: None (Preliminary result)   Collection Time: 05/10/23  3:45 AM   Specimen: BLOOD LEFT ARM  Result Value Ref Range Status   Specimen Description   Final    BLOOD LEFT ARM Performed at [facility], [address], [place], [place] [address]    Special Requests   Final    BOTTLES DRAWN AEROBIC AND ANAEROBIC Blood Culture results may not be optimal due to an inadequate volume of blood received in culture bottles Performed at [id number], [address] [place], [place] [address]    Culture   Final    NO GROWTH 1 DAY Performed at [facility], [address],  [place], [place] [address]    Report Status PENDING  Incomplete      Studies:  No results found.   Assessment: 83 y.o. female   Acute hypoxic respiratory failure Bilateral diffuse pneumonitis, secondary to COVID and or Enhertu COVID infection Acute on chronic congestive heart failure AKI Rectal bleeding Anemia and thrombocytopenia Metastatic breast cancer to lung, on Enhertu  9. Dementia     Plan:  -although her oxygen requirement is stable, she is clinically appears to be more dyspneic to me.  -Continue dexamethasone IV 6 mg twice daily, for at least 2 weeks, then start tapering for 4 to 6 weeks, for probable Enhertu related pneumonitis. -I spoke with her daughter Jodene Nam, about her CODE STATUS again.  I explained to her that there is still possibility she may deteriorate and required ICU and even mechanical ventilation.  She is not ready to agree with DNR but she  voiced good understanding about the overall poor prognosis. -If she does not improve over the weekend, and able to go home with oxygen next week, she would likely need more care at home.  She is not a candidate for more chemotherapy, and her life expectancy is less than 6 months, so she is eligible for hospice home care.  Discussed the difference between palliative care and hospice to her daughter Jodene Nam, she initially was not ready for hospice, but after discussion, she is willing to talk to hospice liaison. I will refer her to [facility]  -I will f/u next Monday -I discussed her case with Dr. Gretta Began, MD 05/12/2023  12:42 PM

## 2023-05-12 NOTE — TOC Initial Note (Signed)
 Transition of Care [facility]) - Initial/Assessment Note    Patient Details  Name: NADALEE NEISWENDER MRN: [id number] Date of Birth: Jul 19, 1940  Transition of Care [facility]) CM/SW Contact:    Larrie Kass, LCSW Phone Number: 05/12/2023, 1:22 PM  Clinical Narrative:                 Received a message from [facility] they received a referral from pt's Oncologist. [facility] following for home hospice.TOC to follow for d/c need.   Expected Discharge Plan: Home w Hospice Care Barriers to Discharge: Continued Medical Work up   Patient Goals and CMS Choice            Expected Discharge Plan and Services                                              Prior Living Arrangements/Services                       Activities of Daily Living   ADL Screening (condition at time of admission) Independently performs ADLs?: No Does the patient have a NEW difficulty with bathing/dressing/toileting/self-feeding that is expected to last >3 days?: No Does the patient have a NEW difficulty with getting in/out of bed, walking, or climbing stairs that is expected to last >3 days?: No Does the patient have a NEW difficulty with communication that is expected to last >3 days?: No Is the patient deaf or have difficulty hearing?: No Does the patient have difficulty seeing, even when wearing glasses/contacts?: No Does the patient have difficulty concentrating, remembering, or making decisions?: Yes  Permission Sought/Granted                  Emotional Assessment              Admission diagnosis:  Rectal bleeding [K62.5] Acute respiratory failure with hypoxia (HCC) [J96.01] Pneumonia due to COVID-19 virus [U07.1, J12.82] Patient Active Problem List   Diagnosis Date Noted   Type 2 diabetes mellitus with chronic kidney disease stage IIIa, without long-term current use of insulin (HCC) 05/11/2023   Essential hypertension 05/11/2023   Acute respiratory failure with hypoxia (HCC)  05/10/2023   Protein-calorie malnutrition, severe (HCC) 05/10/2023   Hyperbilirubinemia 05/10/2023   AKI (acute kidney injury) (HCC) 05/10/2023   Thrombocytopenia (HCC) 05/10/2023   Rectal bleed 05/10/2023   Pneumonia due to COVID-19 virus 05/10/2023   Hyperlipidemia 05/10/2023   Elevated INR 05/10/2023   Normocytic anemia 05/10/2023   Goals of care, counseling/discussion 07/13/2022   Multiple falls 05/09/2022   Port-A-Cath in place 10/23/2020   Cerebrovascular disease 09/17/2020   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 [place] (SE) [place] [address] Phone: [phone] Fax: [phone]  [place] [facility] [address] [place] [address] Phone: [phone] Fax: [phone]     Social Drivers of Health (SDOH) Social History: SDOH Screenings   Food Insecurity: No Food Insecurity (05/10/2023)  Housing: Low Risk  (05/10/2023)  Transportation Needs: No Transportation Needs (05/10/2023)  Recent Concern: Transportation Needs - Unmet Transportation Needs (02/22/2023)  Utilities: Not At Risk (05/10/2023)  Depression (PHQ2-9): Low Risk  (06/21/2022)  Social Connections: Moderately Isolated (05/10/2023)  Tobacco Use: Medium Risk (05/09/2023)   SDOH Interventions:     Readmission Risk Interventions     No data to display

## 2023-05-12 NOTE — Progress Notes (Signed)
  Progress Note   Patient: Christy [name]:[id number] DOB: 10/05/1940 DOA: 05/09/2023     2 DOS: the patient was seen and examined on 05/12/2023 at 9:19AM      Brief hospital course: Metastatic lung cancer with trastuzumab related pulmonitis and COVID.     Assessment and Plan: *Hypoxia Weaning oxygen somewhat today - Continue Decadron - Continue remdesivir, day 3 of 3 - Continue antibiotics, day 3 of 5  Malignant neoplasm of upper-outer quadrant of female breast (HCC) -Appreciate oncology expertise  Essential hypertension Blood pressure normal - Hold olmesartan  Type 2 diabetes mellitus with chronic kidney disease stage IIIa, without long-term current use of insulin (HCC) Glucose controlled  Rectal bleed Resolved    AKI (acute kidney injury) (HCC) Creatinine trending up to 1.96 - Hold olmesartan            Subjective: Patient overall feels slightly better.  Weaned oxygen down slightly.     Physical Exam: BP (!) 128/59 (BP Location: Left Arm)   Pulse 71   Temp (!) 97.5 F (36.4 C) (Oral)   Resp 20   Ht 5\' 4"  (1.626 m)   Wt 70.8 kg   SpO2 94%   BMI 26.79 kg/m   Elderly adult female, lying in bed, no acute distress RRR, no murmurs, she has edema throughout Respiratory normal, lungs diminished but no rales or wheezes appreciated Abdomen soft no tenderness palpation Attention normal, affect normal, judgment and insight appear impaired but at baseline, generalized weakness    Data Reviewed: Discussed with oncology Basic metabolic panel shows worsening renal function CBC shows worsening anemia and worsening leukocytosis Procalcitonin improving  Family Communication: None present    Disposition: Status is: Inpatient         Author: Alberteen Sam, MD 05/12/2023 7:18 PM  For on call review [email or web address].

## 2023-05-12 NOTE — Progress Notes (Signed)
 [facility] LIAISON NOTE   Received request from Donato Schultz, LCSW, Transitions of Care Manager, for hospice services at home after discharge. Spoke with Alen Blew, patient's daughter, to initiate education related to hospice philosophy, services, and team approach to care. Patient/family verbalized understanding of information given. Per discussion, the plan is for discharge home with hospice care when medically cleared.  DME needs discussed.  Patient has the following equipment in the home:  transport wheelchair, Oxygen 2L/[place] (through adapt)  Patient/family requests the following equipment for delivery:   AV RN to assess          The address has been verified and is correct in the chart.   Please send signed and completed DNR home with patient/family if applicable.   Please provide prescriptions at discharge as needed to ensure ongoing symptom management.  [facility] information and contact numbers given to [facility].  Above information shared with Donato Schultz, LCSW, Transitions of Care Manager and hospital medical care team.   Please call with any hospice related questions or concerns. Thank you for the opportunity to participate in this patient's care.   Norris Cross, MA, BSN, RN, FNE Nurse Liaison [phone]

## 2023-05-12 NOTE — Consult Note (Signed)
 Palliative Care Consult Note                                  Date: 05/12/2023   Patient Name: Christy [name]  DOB: May 10, 1940  MRN: [id number]  Age / Sex: 83 y.o., female  PCP: [name], MD Referring Physician: Alberteen Sam, *  Reason for Consultation: Establishing goals of care  HPI/Patient Profile: 83 y.o. female  with past medical history of breast cancer metastatic to lung and lymph nodes who presented to the ED on 05/09/2023 with dyspnea and rectal bleeding. She was found to have COVID and CT scan showed extensive bilateral pneumonitis.   Patient is under the care of Dr. Mosetta Putt for metastatic breast cancer. She was on Enhertu, last dose was 2/4.   Palliative Medicine was consulted for goals of care.   Past Medical History:  Diagnosis Date   Diabetes mellitus without complication (HCC)    Hyperlipidemia    Hypertension     Subjective:   Extensive chart review has been completed prior to meeting with patient/family including labs, vital signs, imaging, progress/consult notes, orders, medications and available advance directive documents.    I assessed patient at bedside. She is oriented to self and hospital. No acute complaints other than she is cold.   I spoke with her daughter/Christy [name] by phone to discuss diagnosis, prognosis, GOC, EOL wishes, disposition, and options.  I introduced Palliative Medicine as specialized medical care for people living with serious illness. It focuses on providing relief from the symptoms and stress of a serious illness.   Created space and opportunity for daughter to express thoughts and feelings regarding current medical situation. Values and goals of care were attempted to be elicited.  Life Review: Patient is originally from [place]. [name] is her only child. Patient lives with [name] and her 3 children (ages 65 and 76 year-old twins).  [name] is caregiver for both her mother and  her 66 year old son with spina bifida.   Functional Status: [name] describes her mother as previously very independent, however her functional status has declined in the past month. She has been sleeping a lot during the day.   GOC Discussion: We discussed patient's current illness and what it means in the larger context of her ongoing co-morbidities. Current clinical status was reviewed.   We reviewed patient's multiple issues - breast cancer with metastasis to lung and lymph nodes, COVID infection, acute respiratory failure, and pneumonitis. I shared my concern that given patient's frailty and co-morbidities, she has less reserve to recover from acute respiratory illness.   Discussed the disease trajectory of metastatic cancer. Daughter understands the cancer is not curable. She also understands patient is not a candidate for additional cancer treatment.   Provided education and counseling on the philosophy and benefits of hospice care. Discussed that it is a medicare benefit for patients with an expected prognosis of 6 months or less in the setting of end-stage disease, and is about maximizing quality of life while allowing the natural course to occur. Discussed that hospice can provide personal care and symptom management for the patient, as well as support for the family.  Daughter states that her goal is to keep her mother at home and with family, while avoiding rehospitalization. She agrees that hospice would support those goals.   We did discuss code status. I explained that patient is currently full code, which is  not consistent with hospice philosophy. Encouraged consideration of DNR/DNI status understanding evidenced based poor outcomes in similar patients, as the cause of the arrest is likely associated with chronic/terminal disease rather than a reversible acute cardio-pulmonary event. Daughter indicates that she would agree DNR is appropriate however is not ready to make that decision  without discussing with her mother.  Unfortunately, she is feeling ill today and not able to come to the hospital. I provided reassurance that code status could be readdressed at home with hospice staff if needed.   Questions and concerns were addressed. PMT contact info given.    Review of Systems  Unable to perform ROS   Objective:   Primary Diagnoses: Present on Admission:  Acute respiratory failure with hypoxia (HCC)  Malignant neoplasm of upper-outer quadrant of female breast (HCC)  Protein-calorie malnutrition, severe (HCC)  Hyperbilirubinemia  AKI (acute kidney injury) (HCC)  Thrombocytopenia (HCC)  Rectal bleed  Pneumonia due to COVID-19 virus  Hyperlipidemia  Normocytic anemia  Cerebrovascular disease   Physical Exam Vitals reviewed.  Constitutional:      General: She is not in acute distress.    Comments: Frail and chronically ill-appearing  Pulmonary:     Effort: Pulmonary effort is normal.  Neurological:     Mental Status: She is alert.     Comments: Oriented to self and hospital  Psychiatric:        Cognition and Memory: Cognition is impaired. Memory is impaired.     Palliative Assessment/Data: PPS 30-40%     Assessment & Plan:   SUMMARY OF RECOMMENDATIONS   Continue current supportive interventions Daughter agreeable to home with hospice once patient is medically optimized and has completed course of remdesivir, decadron, and antibiotics Code status was discussed in detail today - daughter seems to agree DNR status is appropriate but wants to speak with her mother before making this decision PMT will continue to follow  Primary Decision Maker: NEXT OF KIN - daughter/Christy [name]  Code Status/Advance Care Planning: Full code - for now, but can be readdressed prior to discharge or at home with hospice staff  Symptom Management:  Agree with oxycodone IR or morphine IV as needed for pain   Prognosis:  < 6 months  Discharge Planning:  Home with  Hospice    Discussed with: Dr. Maryfrances Bunnell, RN, and [facility]   Thank you for allowing [place] to participate in the care of Christy [name]   Time Total: 78 minutes  Detailed review of medical records (labs, imaging, vital signs), medically appropriate exam, discussed with treatment team, counseling and education to family, & staff, documenting clinical information, coordination of care.   Signed by: Sherlean Foot, NP Palliative Medicine Team  Team Phone # [phone]  For individual providers, please see AMION

## 2023-05-12 NOTE — Plan of Care (Signed)
  Problem: Education: Goal: Knowledge of risk factors and measures for prevention of condition will improve Outcome: Progressing   Problem: Coping: Goal: Psychosocial and spiritual needs will be supported Outcome: Progressing   Problem: Respiratory: Goal: Will maintain a patent airway Outcome: Progressing Goal: Complications related to the disease process, condition or treatment will be avoided or minimized Outcome: Progressing   Problem: Education: Goal: Ability to describe self-care measures that may prevent or decrease complications (Diabetes Survival Skills Education) will improve Outcome: Progressing Goal: Individualized Educational Video(s) Outcome: Progressing   Problem: Coping: Goal: Ability to adjust to condition or change in health will improve Outcome: Progressing   Problem: Fluid Volume: Goal: Ability to maintain a balanced intake and output will improve Outcome: Progressing   Problem: Health Behavior/Discharge Planning: Goal: Ability to identify and utilize available resources and services will improve Outcome: Progressing Goal: Ability to manage health-related needs will improve Outcome: Progressing   Problem: Metabolic: Goal: Ability to maintain appropriate glucose levels will improve Outcome: Progressing   Problem: Nutritional: Goal: Maintenance of adequate nutrition will improve Outcome: Progressing Goal: Progress toward achieving an optimal weight will improve Outcome: Progressing   Problem: Skin Integrity: Goal: Risk for impaired skin integrity will decrease Outcome: Progressing   Problem: Tissue Perfusion: Goal: Adequacy of tissue perfusion will improve Outcome: Progressing   Problem: Education: Goal: Knowledge of General Education information will improve Description: Including pain rating scale, medication(s)/side effects and non-pharmacologic comfort measures Outcome: Progressing   Problem: Health Behavior/Discharge Planning: Goal:  Ability to manage health-related needs will improve Outcome: Progressing   Problem: Clinical Measurements: Goal: Ability to maintain clinical measurements within normal limits will improve Outcome: Progressing Goal: Will remain free from infection Outcome: Progressing Goal: Diagnostic test results will improve Outcome: Progressing Goal: Respiratory complications will improve Outcome: Progressing Goal: Cardiovascular complication will be avoided Outcome: Progressing   Problem: Activity: Goal: Risk for activity intolerance will decrease Outcome: Progressing   Problem: Nutrition: Goal: Adequate nutrition will be maintained Outcome: Progressing   Problem: Coping: Goal: Level of anxiety will decrease Outcome: Progressing   Problem: Elimination: Goal: Will not experience complications related to bowel motility Outcome: Progressing Goal: Will not experience complications related to urinary retention Outcome: Progressing   Problem: Pain Managment: Goal: General experience of comfort will improve and/or be controlled Outcome: Progressing   Problem: Safety: Goal: Ability to remain free from injury will improve Outcome: Progressing   Problem: Skin Integrity: Goal: Risk for impaired skin integrity will decrease Outcome: Progressing   Problem: Activity: Goal: Ability to tolerate increased activity will improve Outcome: Progressing   Problem: Clinical Measurements: Goal: Ability to maintain a body temperature in the normal range will improve Outcome: Progressing   Problem: Respiratory: Goal: Ability to maintain adequate ventilation will improve Outcome: Progressing Goal: Ability to maintain a clear airway will improve Outcome: Progressing

## 2023-05-13 DIAGNOSIS — J9601 Acute respiratory failure with hypoxia: Secondary | ICD-10-CM | POA: Diagnosis not present

## 2023-05-13 LAB — BASIC METABOLIC PANEL
Anion gap: 9 (ref 5–15)
BUN: 54 mg/dL — ABNORMAL HIGH (ref 8–23)
CO2: 23 mmol/L (ref 22–32)
Calcium: 9.6 mg/dL (ref 8.9–10.3)
Chloride: 111 mmol/L (ref 98–111)
Creatinine, Ser: 2.15 mg/dL — ABNORMAL HIGH (ref 0.44–1.00)
GFR, Estimated: 22 mL/min — ABNORMAL LOW (ref >60)
Glucose, Bld: 157 mg/dL — ABNORMAL HIGH (ref 70–99)
Potassium: 4.1 mmol/L (ref 3.5–5.1)
Sodium: 143 mmol/L (ref 135–145)

## 2023-05-13 LAB — CBC
HCT: 25.8 % — ABNORMAL LOW (ref 36.0–46.0)
Hemoglobin: 8.9 g/dL — ABNORMAL LOW (ref 12.0–15.0)
MCH: 29.4 pg (ref 26.0–34.0)
MCHC: 34.5 g/dL (ref 30.0–36.0)
MCV: 85.1 fL (ref 80.0–100.0)
Platelets: 120 10*3/uL — ABNORMAL LOW (ref 150–400)
RBC: 3.03 MIL/uL — ABNORMAL LOW (ref 3.87–5.11)
RDW: 18.7 % — ABNORMAL HIGH (ref 11.5–15.5)
WBC: 24 10*3/uL — ABNORMAL HIGH (ref 4.0–10.5)
nRBC: 0.5 % — ABNORMAL HIGH (ref 0.0–0.2)

## 2023-05-13 LAB — GLUCOSE, CAPILLARY
Glucose-Capillary: 144 mg/dL — ABNORMAL HIGH (ref 70–99)
Glucose-Capillary: 150 mg/dL — ABNORMAL HIGH (ref 70–99)
Glucose-Capillary: 196 mg/dL — ABNORMAL HIGH (ref 70–99)
Glucose-Capillary: 170 mg/dL — ABNORMAL HIGH (ref 70–99)

## 2023-05-13 MED ORDER — SODIUM CHLORIDE 0.9 % IV SOLN: INTRAVENOUS | Status: DC

## 2023-05-13 NOTE — Plan of Care (Signed)
  Problem: Education: Goal: Knowledge of risk factors and measures for prevention of condition will improve Outcome: Progressing   Problem: Coping: Goal: Psychosocial and spiritual needs will be supported Outcome: Progressing   Problem: Respiratory: Goal: Will maintain a patent airway Outcome: Progressing Goal: Complications related to the disease process, condition or treatment will be avoided or minimized Outcome: Progressing   Problem: Education: Goal: Ability to describe self-care measures that may prevent or decrease complications (Diabetes Survival Skills Education) will improve Outcome: Progressing Goal: Individualized Educational Video(s) Outcome: Progressing   Problem: Coping: Goal: Ability to adjust to condition or change in health will improve Outcome: Progressing   Problem: Fluid Volume: Goal: Ability to maintain a balanced intake and output will improve Outcome: Progressing   Problem: Health Behavior/Discharge Planning: Goal: Ability to identify and utilize available resources and services will improve Outcome: Progressing Goal: Ability to manage health-related needs will improve Outcome: Progressing   Problem: Metabolic: Goal: Ability to maintain appropriate glucose levels will improve Outcome: Progressing   Problem: Nutritional: Goal: Maintenance of adequate nutrition will improve Outcome: Progressing Goal: Progress toward achieving an optimal weight will improve Outcome: Progressing   Problem: Skin Integrity: Goal: Risk for impaired skin integrity will decrease Outcome: Progressing   Problem: Tissue Perfusion: Goal: Adequacy of tissue perfusion will improve Outcome: Progressing   Problem: Education: Goal: Knowledge of General Education information will improve Description: Including pain rating scale, medication(s)/side effects and non-pharmacologic comfort measures Outcome: Progressing   Problem: Health Behavior/Discharge Planning: Goal:  Ability to manage health-related needs will improve Outcome: Progressing   Problem: Clinical Measurements: Goal: Ability to maintain clinical measurements within normal limits will improve Outcome: Progressing Goal: Will remain free from infection Outcome: Progressing Goal: Diagnostic test results will improve Outcome: Progressing Goal: Respiratory complications will improve Outcome: Progressing Goal: Cardiovascular complication will be avoided Outcome: Progressing   Problem: Activity: Goal: Risk for activity intolerance will decrease Outcome: Progressing   Problem: Nutrition: Goal: Adequate nutrition will be maintained Outcome: Progressing   Problem: Coping: Goal: Level of anxiety will decrease Outcome: Progressing   Problem: Elimination: Goal: Will not experience complications related to bowel motility Outcome: Progressing Goal: Will not experience complications related to urinary retention Outcome: Progressing   Problem: Pain Managment: Goal: General experience of comfort will improve and/or be controlled Outcome: Progressing   Problem: Safety: Goal: Ability to remain free from injury will improve Outcome: Progressing   Problem: Skin Integrity: Goal: Risk for impaired skin integrity will decrease Outcome: Progressing   Problem: Activity: Goal: Ability to tolerate increased activity will improve Outcome: Progressing   Problem: Clinical Measurements: Goal: Ability to maintain a body temperature in the normal range will improve Outcome: Progressing   Problem: Respiratory: Goal: Ability to maintain adequate ventilation will improve Outcome: Progressing Goal: Ability to maintain a clear airway will improve Outcome: Progressing

## 2023-05-13 NOTE — Care Plan (Signed)
Spoke with daughter by phone

## 2023-05-13 NOTE — Progress Notes (Signed)
  Progress Note   Patient: Christy [name]:[id number] DOB: 06-02-1940 DOA: 05/09/2023     3 DOS: the patient was seen and examined on 05/13/2023 at 3:19PM      Brief hospital course: 83 y.o. F with breast CA metastatic to lung and lymph nodes who presented with dyspnea, found to have COVID and trastuzumab related pulmonitis.     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  Completed 3 days remdesivir - Continue Decadron 6 mg BID IV, plan for 2 weeks, then gradual taper - Continue Rocephin and azithromycin, day 4 of 5 - Incentive spirometer  Malignant neoplasm of upper-outer quadrant of female breast (HCC) With this lung reaction, Oncology have recommended no further chemo.  Oncology and palliative care met with the patient and daughter, family elected to enroll hospice at the time of discharge.        Type 2 diabetes mellitus with chronic kidney disease stage IIIa, without long-term current use of insulin (HCC) Glucose stable - Hold metformin - Continue SS corrections    Hyperlipidemia Cerebrovascular disease Hypertension - Continue Lipitor - Hold aspirin, olmesartan     AKI (acute kidney injury) (HCC) Creatinine worsening up to 2.1 - Start IV fluids - Hold olmesartan            Subjective: Patient has no complaints, she is cold, she is tired.  She has no confusion, fever, change in her cough or sputum.     Physical Exam: BP (!) 147/64 (BP Location: Left Arm)   Pulse 73   Temp 97.6 F (36.4 C) (Oral)   Resp 20   Ht 5\' 4"  (1.626 m)   Wt 75.2 kg   SpO2 95%   BMI 28.46 kg/m   Elderly adult female, no acute distress, lying in bed RRR, mild edema throughout, no murmurs Respiratory rate normal, lungs diminished, poor inspiratory effort, no rales or wheezes  appreciated Abdomen soft without tenderness to palpation or guarding Attention normal, affect tired, judgment and insight appear impaired but at baseline, generalized weakness but symmetric, speech fluent    Data Reviewed: Basic metabolic panel shows creatinine up to 2.5, sodium and potassium normal CBC shows worsening white count, stable hemoglobin, platelets 120  Family Communication: Daughter not present today    Disposition: Status is: Inpatient         Author: Alberteen Sam, MD 05/13/2023 4:53 PM  For on call review [email or web address].

## 2023-05-13 NOTE — Plan of Care (Signed)
  Problem: Coping: Goal: Psychosocial and spiritual needs will be supported Outcome: Progressing   Problem: Respiratory: Goal: Will maintain a patent airway Outcome: Progressing Goal: Complications related to the disease process, condition or treatment will be avoided or minimized Outcome: Progressing   

## 2023-05-14 DIAGNOSIS — J9601 Acute respiratory failure with hypoxia: Secondary | ICD-10-CM | POA: Diagnosis not present

## 2023-05-14 LAB — BASIC METABOLIC PANEL
Anion gap: 8 (ref 5–15)
BUN: 55 mg/dL — ABNORMAL HIGH (ref 8–23)
CO2: 22 mmol/L (ref 22–32)
Calcium: 9.2 mg/dL (ref 8.9–10.3)
Chloride: 111 mmol/L (ref 98–111)
Creatinine, Ser: 2.15 mg/dL — ABNORMAL HIGH (ref 0.44–1.00)
GFR, Estimated: 22 mL/min — ABNORMAL LOW (ref >60)
Glucose, Bld: 159 mg/dL — ABNORMAL HIGH (ref 70–99)
Potassium: 4 mmol/L (ref 3.5–5.1)
Sodium: 141 mmol/L (ref 135–145)

## 2023-05-14 LAB — CBC
HCT: 26.6 % — ABNORMAL LOW (ref 36.0–46.0)
Hemoglobin: 8.7 g/dL — ABNORMAL LOW (ref 12.0–15.0)
MCH: 29 pg (ref 26.0–34.0)
MCHC: 32.7 g/dL (ref 30.0–36.0)
MCV: 88.7 fL (ref 80.0–100.0)
Platelets: 133 10*3/uL — ABNORMAL LOW (ref 150–400)
RBC: 3 MIL/uL — ABNORMAL LOW (ref 3.87–5.11)
RDW: 19 % — ABNORMAL HIGH (ref 11.5–15.5)
WBC: 25 10*3/uL — ABNORMAL HIGH (ref 4.0–10.5)
nRBC: 0.8 % — ABNORMAL HIGH (ref 0.0–0.2)

## 2023-05-14 LAB — GLUCOSE, CAPILLARY
Glucose-Capillary: 155 mg/dL — ABNORMAL HIGH (ref 70–99)
Glucose-Capillary: 155 mg/dL — ABNORMAL HIGH (ref 70–99)
Glucose-Capillary: 134 mg/dL — ABNORMAL HIGH (ref 70–99)
Glucose-Capillary: 232 mg/dL — ABNORMAL HIGH (ref 70–99)

## 2023-05-14 NOTE — Progress Notes (Signed)
  Progress Note   Patient: Christy [name] ZOX:[id number] DOB: 1940/08/31 DOA: 05/09/2023     4 DOS: the patient was seen and examined on 05/14/2023        Brief hospital course: Trastuzumab pulmonitis and COVID, slowly resolving.  Down to 2L today.     Assessment and Plan: * Possible Trastuzumab-related lung toxicity Complete Abx today Continue Decadron   Type 2 diabetes mellitus with chronic kidney disease stage IIIa, without long-term current use of insulin (HCC) Glucose stable - Continue SS corrections   Hyperlipidemia Cerebrovascular disease Hypertension - Continue Lipitor - Hold aspirin, olmesartan  AKI (acute kidney injury) (HCC) Cr unchanged. - Hold further fluids - Trend Cr          Subjective: No change.  Tired.  No nursing concerns.     Physical Exam: BP (!) 167/70 (BP Location: Left Arm)   Pulse 67   Temp (!) 97.5 F (36.4 C) (Oral)   Resp 20   Ht 5\' 4"  (1.626 m)   Wt 69.7 kg   SpO2 94%   BMI 26.38 kg/m   Elderly adult female, no acute distress, lying in bed RRR, mild edema throughout, no murmurs Respiratory rate normal, lungs diminished, poor inspiratory effort, no rales or wheezes appreciated Abdomen soft without tenderness to palpation or guarding Attention normal, affect tired, judgment and insight appear impaired but at baseline, generalized weakness but symmetric, speech fluent    Data Reviewed: Cr unchanged at 2.15   Family Communication:     Disposition: Status is: Inpatient         Author: Alberteen Sam, MD 05/14/2023 5:31 PM  For on call review [email or web address].

## 2023-05-14 NOTE — Plan of Care (Signed)
  Problem: Education: Goal: Knowledge of risk factors and measures for prevention of condition will improve Outcome: Progressing   Problem: Coping: Goal: Psychosocial and spiritual needs will be supported Outcome: Progressing   Problem: Respiratory: Goal: Will maintain a patent airway Outcome: Progressing Goal: Complications related to the disease process, condition or treatment will be avoided or minimized Outcome: Progressing   Problem: Education: Goal: Ability to describe self-care measures that may prevent or decrease complications (Diabetes Survival Skills Education) will improve Outcome: Progressing Goal: Individualized Educational Video(s) Outcome: Progressing

## 2023-05-15 DIAGNOSIS — J9601 Acute respiratory failure with hypoxia: Secondary | ICD-10-CM | POA: Diagnosis not present

## 2023-05-15 DIAGNOSIS — B370 Candidal stomatitis: Secondary | ICD-10-CM | POA: Insufficient documentation

## 2023-05-15 LAB — BASIC METABOLIC PANEL
Anion gap: 8 (ref 5–15)
BUN: 55 mg/dL — ABNORMAL HIGH (ref 8–23)
CO2: 22 mmol/L (ref 22–32)
Calcium: 9.3 mg/dL (ref 8.9–10.3)
Chloride: 112 mmol/L — ABNORMAL HIGH (ref 98–111)
Creatinine, Ser: 1.99 mg/dL — ABNORMAL HIGH (ref 0.44–1.00)
GFR, Estimated: 24 mL/min — ABNORMAL LOW (ref >60)
Glucose, Bld: 165 mg/dL — ABNORMAL HIGH (ref 70–99)
Potassium: 4.1 mmol/L (ref 3.5–5.1)
Sodium: 142 mmol/L (ref 135–145)

## 2023-05-15 LAB — CBC
HCT: 26.6 % — ABNORMAL LOW (ref 36.0–46.0)
Hemoglobin: 8.7 g/dL — ABNORMAL LOW (ref 12.0–15.0)
MCH: 28.2 pg (ref 26.0–34.0)
MCHC: 32.7 g/dL (ref 30.0–36.0)
MCV: 86.4 fL (ref 80.0–100.0)
Platelets: 128 10*3/uL — ABNORMAL LOW (ref 150–400)
RBC: 3.08 MIL/uL — ABNORMAL LOW (ref 3.87–5.11)
RDW: 18.9 % — ABNORMAL HIGH (ref 11.5–15.5)
WBC: 24.4 10*3/uL — ABNORMAL HIGH (ref 4.0–10.5)
nRBC: 1.3 % — ABNORMAL HIGH (ref 0.0–0.2)

## 2023-05-15 LAB — GLUCOSE, CAPILLARY
Glucose-Capillary: 167 mg/dL — ABNORMAL HIGH (ref 70–99)
Glucose-Capillary: 166 mg/dL — ABNORMAL HIGH (ref 70–99)
Glucose-Capillary: 194 mg/dL — ABNORMAL HIGH (ref 70–99)
Glucose-Capillary: 214 mg/dL — ABNORMAL HIGH (ref 70–99)

## 2023-05-15 LAB — CULTURE, BLOOD (ROUTINE X 2): Culture: NO GROWTH

## 2023-05-15 MED ORDER — DEXAMETHASONE 4 MG PO TABS
6.0000 mg | ORAL_TABLET | Freq: Two times a day (BID) | ORAL | Status: DC
  Administered 2023-05-15 – 2023-05-16 (×3): 6 mg via ORAL
  Filled 2023-05-15 (×3): qty 1

## 2023-05-15 MED ORDER — FLUCONAZOLE 100 MG PO TABS
100.0000 mg | ORAL_TABLET | Freq: Every day | ORAL | Status: DC
  Administered 2023-05-15 – 2023-05-16 (×2): 100 mg via ORAL
  Filled 2023-05-15 (×2): qty 1

## 2023-05-15 NOTE — Progress Notes (Signed)
  Progress Note   Patient: Christy [name] WUJ:[id number] DOB: 03/01/1941 DOA: 05/09/2023     5 DOS: the patient was seen and examined on 05/15/2023        Brief hospital course: 83 y.o. F with breast CA metastatic to lung and lymph nodes who presented with dyspnea, found to have COVID and trastuzumab related pulmonitis.     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  Completed 3 days remdesivir, and 5 days Rocephin and azithromycin.  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  Oncology and palliative care met with the patient and daughter, family elected to enroll hospice at the time of discharge.   Discussed with Hospice today, they will have equipment in the home this evening or tomorrow morning  Oropharyngeal candidiasis Sore throat and whitish plaques in mouth in setting of high dose steroids. - Start fluconazole, 100 daily for 14 days, discussed with RPh  Essential hypertension BP controlled - Hold olmesartan  Type 2 diabetes mellitus with chronic kidney disease stage IIIa, without long-term current use of insulin (HCC) Glucose stable - Hold metformin - Continue SS corrections  Normocytic anemia Hgb stable  Hyperlipidemia - Stop Lipitor at discharge  Rectal bleed Evalauated by GI.  They suspect rectal hemorrhoid.  No further bleeding.   - Stop aspirin  Thrombocytopenia (HCC) Platelets stable, no bleeding  AKI (acute kidney injury) (HCC) Cr up to 1.9, unchanged despite fluids and holding ARB - Stop  olmesartan  Hyperbilirubinemia Due to metastasis.  Protein-calorie malnutrition, severe (HCC) - Continue nutrition supplements  Cerebrovascular disease No residual deficits. - Stop Lipitor and aspirin at discharge          Subjective: Tired, cold.  Wants to go home.  Also wants to get better.  No fever no change in mentation, no respiratory distress     Physical Exam: BP (!) 155/65 (BP Location: Left Arm)   Pulse 77   Temp (!) 97.5 F (36.4 C) (Oral)   Resp 20   Ht 5\' 4"  (1.626 m)   Wt 69.8 kg   SpO2 94%   BMI 26.41 kg/m   Elderly adult female, lying in bed, appears weak and tired RRR, no murmurs, no peripheral edema Respiratory rate slow, shallow, lung sounds diminished, there are crackles in bilateral bases Abdomen soft without tenderness palpation or guarding, no ascites. Appears listless, awake, makes eye contact and responds to questions but hard of hearing, and only oriented to self and place.  Severe generalized weakness.    Data Reviewed: Basic metabolic panel shows creatinine 1.99, unchanged, normal sodium and potassium CBC shows persistent leukocytosis, stable anemia, stable thrombocytopenia  Family Communication: Daughter by phone    Disposition: Status is: Inpatient         Author: Alberteen Sam, MD 05/15/2023 4:48 PM  For on call review [email or web address].

## 2023-05-15 NOTE — TOC Transition Note (Signed)
 Transition of Care [facility]) - Discharge Note   Patient Details  Name: Christy [name] MRN: [id number] Date of Birth: [birth year]  [facility] CM/SW Contact:  Larrie Kass, LCSW Phone Number: 05/15/2023, 9:45 AM   Clinical Narrative:     Pt to d/c home with [facility] hospice, pt family to provided transport home. No futher TOC needs , TOC sign off.   Final next level of care: Home w Hospice Care Barriers to Discharge: No Barriers Identified   Patient Goals and CMS Choice Patient states their goals for this hospitalization and ongoing recovery are:: retrun home with hospice          Discharge Placement                    Patient and family notified of of transfer: 05/15/23  Discharge Plan and Services Additional resources added to the After Visit Summary for                                       Social Drivers of Health (SDOH) Interventions SDOH Screenings   Food Insecurity: No Food Insecurity (05/10/2023)  Housing: Low Risk  (05/10/2023)  Transportation Needs: No Transportation Needs (05/10/2023)  Recent Concern: Transportation Needs - Unmet Transportation Needs (02/22/2023)  Utilities: Not At Risk (05/10/2023)  Depression (PHQ2-9): Low Risk  (06/21/2022)  Social Connections: Moderately Isolated (05/10/2023)  Tobacco Use: Medium Risk (05/09/2023)     Readmission Risk Interventions     No data to display

## 2023-05-15 NOTE — Assessment & Plan Note (Signed)
 Due to metastasis.

## 2023-05-15 NOTE — Plan of Care (Signed)
   Problem: Education: Goal: Knowledge of risk factors and measures for prevention of condition will improve Outcome: Progressing   Problem: Coping: Goal: Psychosocial and spiritual needs will be supported Outcome: Progressing   Problem: Respiratory: Goal: Will maintain a patent airway Outcome: Progressing Goal: Complications related to the disease process, condition or treatment will be avoided or minimized Outcome: Progressing

## 2023-05-15 NOTE — Care Management Important Message (Signed)
 Important Message  Patient Details No IM Letter given due to Patient Discharging with Hospice. Name: Christy [name] MRN: [id number] Date of Birth: 12/25/1940   Important Message Given:  No     Caren Macadam 05/15/2023, 9:51 AM

## 2023-05-15 NOTE — Assessment & Plan Note (Addendum)
 Sore throat and whitish plaques in mouth in setting of high dose steroids. - Start fluconazole, 100 daily for 14 days, discussed with RPh

## 2023-05-15 NOTE — Progress Notes (Signed)
 Onc brief note   Per Dr. Maryfrances Bunnell, patient's daughter has agreed with home hospice on discharge, which I recommended to her last week.  Patient will likely go home tomorrow.  I stopped by to visit the patient.  She is in good spirit, still has dyspnea when she talks.  She is on 2 to 3 L oxygen nasal cannula.  I will be happy to be her attending when she is under hospice care.  I will call her next week to check on her.   Malachy Mood MD  05/15/2023

## 2023-05-16 ENCOUNTER — Other Ambulatory Visit: Payer: Self-pay

## 2023-05-16 ENCOUNTER — Other Ambulatory Visit (HOSPITAL_COMMUNITY): Payer: Self-pay

## 2023-05-16 DIAGNOSIS — J9601 Acute respiratory failure with hypoxia: Secondary | ICD-10-CM | POA: Diagnosis not present

## 2023-05-16 LAB — GLUCOSE, CAPILLARY
Glucose-Capillary: 164 mg/dL — ABNORMAL HIGH (ref 70–99)
Glucose-Capillary: 182 mg/dL — ABNORMAL HIGH (ref 70–99)

## 2023-05-16 MED ORDER — LORAZEPAM 1 MG PO TABS
1.0000 mg | ORAL_TABLET | Freq: Three times a day (TID) | ORAL | 0 refills | Status: DC | PRN
  Filled 2023-05-16: qty 60, 20d supply, fill #0

## 2023-05-16 MED ORDER — DEXAMETHASONE 6 MG PO TABS
ORAL_TABLET | ORAL | 0 refills | Status: DC
  Filled 2023-05-16: qty 18, 4d supply, fill #0
  Filled 2023-05-16: qty 39, 17d supply, fill #0

## 2023-05-16 MED ORDER — GUAIFENESIN ER 600 MG PO TB12: 600.0000 mg | ORAL_TABLET | Freq: Two times a day (BID) | ORAL | Status: DC | PRN

## 2023-05-16 MED ORDER — GUAIFENESIN-DM 100-10 MG/5ML PO SYRP: 5.0000 mL | ORAL_SOLUTION | ORAL | Status: DC | PRN

## 2023-05-16 MED ORDER — ONDANSETRON HCL 4 MG PO TABS
4.0000 mg | ORAL_TABLET | Freq: Four times a day (QID) | ORAL | 0 refills | Status: DC | PRN
  Filled 2023-05-16: qty 20, 5d supply, fill #0

## 2023-05-16 MED ORDER — FLUCONAZOLE 100 MG PO TABS
100.0000 mg | ORAL_TABLET | Freq: Every day | ORAL | 0 refills | Status: DC
  Filled 2023-05-16: qty 12, 12d supply, fill #0

## 2023-05-16 MED ORDER — OXYCODONE HCL 5 MG PO TABS
5.0000 mg | ORAL_TABLET | ORAL | 0 refills | Status: DC | PRN
  Filled 2023-05-16: qty 30, 5d supply, fill #0

## 2023-05-16 NOTE — TOC Transition Note (Signed)
 Transition of Care [facility]) - Discharge Note   Patient Details  Name: Christy [name] MRN: [id number] Date of Birth: 08-17-1940  [facility]) CM/SW Contact:  Larrie Kass, LCSW Phone Number: 05/16/2023, 9:34 AM   Clinical Narrative:    CSW spoke with the patient's daughter, who is requesting EMS transportation home. CSW has confirmed the address and called [facility]. No further TOC needs, TOC sign-off.     Final next level of care: Home w Hospice Care Barriers to Discharge: No Barriers Identified   Patient Goals and CMS Choice Patient states their goals for this hospitalization and ongoing recovery are:: retrun home with hospice          Discharge Placement                    Patient and family notified of of transfer: 05/15/23  Discharge Plan and Services Additional resources added to the After Visit Summary for                                       Social Drivers of Health (SDOH) Interventions SDOH Screenings   Food Insecurity: No Food Insecurity (05/10/2023)  Housing: Low Risk  (05/10/2023)  Transportation Needs: No Transportation Needs (05/10/2023)  Recent Concern: Transportation Needs - Unmet Transportation Needs (02/22/2023)  Utilities: Not At Risk (05/10/2023)  Depression (PHQ2-9): Low Risk  (06/21/2022)  Social Connections: Moderately Isolated (05/10/2023)  Tobacco Use: Medium Risk (05/09/2023)     Readmission Risk Interventions     No data to display

## 2023-05-16 NOTE — Care Management Important Message (Signed)
 Important Message  Patient Details IM Letter given. Name: TELLY BROBERG MRN: [id number] Date of Birth: [birth year]   Important Message Given:  Yes - Medicare IM     Caren Macadam 05/16/2023, 10:34 AM

## 2023-05-16 NOTE — Progress Notes (Signed)
 TOC meds in a secure bag delivered to pt in room- family present - AVS reviewed w/ family present- daughter verbalized an understanding. Larger copy of decadron taper schedule placed in d/c envelope for family along with an extra AVS

## 2023-05-16 NOTE — Discharge Summary (Signed)
 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 F with breast CA metastatic to lung and lymph nodes who presented with dyspnea, found to have COVID and trastuzumab related pulmonitis.     * Acute respiratory failure with hypoxia (HCC) Pneumonia due to COVID-19 Possible Trastuzumab-related lung toxicity Presented with dyspnea tachypnea, hypoxia to 83%, requiring here 6-8L to maintain SpO2 90%.  Completed 3 days remdesivir, and 5 days Rocephin and azithromycin.  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  Oncology and palliative care met with the patient and daughter, family elected to enroll hospice at the time of discharge.   Discussed with Hospice today, they will have equipment in the home this evening or tomorrow morning  Oropharyngeal candidiasis Sore throat and  whitish plaques in mouth in setting of high dose steroids. - Start fluconazole, 100 daily for 14 days, discussed with RPh  Essential hypertension BP controlled - Hold olmesartan  Type 2 diabetes mellitus with chronic kidney disease stage IIIa, without long-term current use of insulin (HCC) Glucose stable - Hold metformin - Continue SS corrections  Normocytic anemia Hgb stable  Hyperlipidemia - Stop Lipitor at discharge  Rectal bleed Evalauated by GI.  They suspect rectal hemorrhoid.  No further bleeding.   - Stop aspirin  Thrombocytopenia (HCC) Platelets stable, no bleeding  AKI (acute kidney injury) (HCC) Cr up to 1.9, unchanged despite fluids and holding ARB - Stop olmesartan  Hyperbilirubinemia Due to metastasis.  Protein-calorie malnutrition, severe (HCC) - Continue nutrition supplements  Cerebrovascular disease No residual deficits. - Stop Lipitor and aspirin at discharge               Disposition: Hospice care Diet recommendation:  Discharge Diet Orders (From admission, onward)     Start     Ordered   05/16/23 0000  Diet - low sodium heart healthy        05/16/23 1036             DISCHARGE MEDICATION: Allergies as of 05/16/2023   No Known Allergies      Medication List     STOP taking these medications    aspirin EC 325 MG tablet   atorvastatin 20 MG tablet Commonly known as: LIPITOR   furosemide 20 MG tablet Commonly known as: Lasix   Iron 325 (65 Fe) MG Tabs   metFORMIN 500 MG tablet Commonly  known as: GLUCOPHAGE   mupirocin ointment 2 % Commonly known as: BACTROBAN   olmesartan 40 MG tablet Commonly known as: BENICAR   potassium chloride SA 20 MEQ tablet Commonly known as: KLOR-CON M       TAKE these medications    dexamethasone 6 MG tablet Commonly known as: DECADRON Taper as directed What changed:  medication strength additional instructions   fluconazole 100 MG tablet Commonly known as:  DIFLUCAN Take 1 tablet (100 mg total) by mouth daily.   guaiFENesin 600 MG 12 hr tablet Commonly known as: MUCINEX Take 1 tablet (600 mg total) by mouth 2 (two) times daily as needed for cough or to loosen phlegm.   guaiFENesin-dextromethorphan 100-10 MG/5ML syrup Commonly known as: ROBITUSSIN DM Take 5 mLs by mouth every 4 (four) hours as needed (mild cough).   lidocaine-prilocaine cream Commonly known as: EMLA Apply 1 Application topically as needed.   lidocaine-prilocaine cream Commonly known as: EMLA Apply to affected area once   LORazepam 1 MG tablet Commonly known as: Ativan Take 1 tablet (1 mg total) by mouth every 8 (eight) hours as needed for anxiety.   ondansetron 4 MG tablet Commonly known as: ZOFRAN Take 1 tablet (4 mg total) by mouth every 6 (six) hours as needed for nausea. What changed:  medication strength how much to take when to take this reasons to take this additional instructions   oxyCODONE 5 MG immediate release tablet Commonly known as: Oxy IR/ROXICODONE Take 1 tablet (5 mg total) by mouth every 4 (four) hours as needed for moderate pain (pain score 4-6) or severe pain (pain score 7-10).        Follow-up Information     [facility] Follow up.   Specialty: Hospice and Palliative Medicine Contact information: [address] [place] [address] [phone]                Discharge Instructions     Diet - low sodium heart healthy   Complete by: As directed    Discharge instructions   Complete by: As directed    Take a prolonged dexamethasone taper: Take dexamethasone 6 mg twice daily for 2 weeks On March 24, reduce to 6 mg (1 tab) in the morning and 3 mg (1/2 tab) in the evening for 1 week On March 31, reduce to 3 mg (1/2 tab) in the morning and at night On April 7, reduce to 3 mg (1/2 tab) once daily for 7 days then stop   Increase activity slowly   Complete by: As directed        Discharge  Exam: Filed Weights   05/13/23 0500 05/14/23 0624 05/15/23 0500  Weight: 75.2 kg 69.7 kg 69.8 kg   BP (!) 157/69 (BP Location: Left Arm)   Pulse 88   Temp 97.8 F (36.6 C) (Oral)   Resp 20   Ht 5\' 4"  (1.626 m)   Wt 69.8 kg   SpO2 94%   BMI 26.41 kg/m   General: Pt is awake sitting up, tired, out of breath, but not in acute distress Cardiovascular: RRR, nl S1-S2, no murmurs appreciated.   Mild nonpitting peripheral edema.   Respiratory: Shallow, appears somewhat out of breath, but SpO2 normal on 2L.  Lungs diminished. Abdominal: Abdomen soft and non-tender.  No distension or HSM.   Neuro/Psych: Strength symmetric in upper and lower extremities but generally weak.  Judgment and insight appear impaired but at baseline.   Condition at discharge: poor  The  results of significant diagnostics from this hospitalization (including imaging, microbiology, ancillary and laboratory) are listed below for reference.   Imaging Studies: CT CHEST ABDOMEN PELVIS WO CONTRAST Result Date: 05/10/2023 CLINICAL DATA:  Abdominal pain. Dyspnea. Rectal bleeding. Dark stools. Positive fetal occult stool card. History of breast cancer. * Tracking Code: BO * EXAM: CT CHEST, ABDOMEN AND PELVIS WITHOUT CONTRAST TECHNIQUE: Multidetector CT imaging of the chest, abdomen and pelvis was performed following the standard protocol without IV contrast. RADIATION DOSE REDUCTION: This exam was performed according to the departmental dose-optimization program which includes automated exposure control, adjustment of the mA and/or kV according to patient size and/or use of iterative reconstruction technique. COMPARISON:  Chest abdomen pelvis CT 10/03/2022 FINDINGS: CT CHEST FINDINGS Cardiovascular: The heart size is normal. No substantial pericardial effusion. Coronary artery calcification is evident. Moderate atherosclerotic calcification is noted in the wall of the thoracic aorta. Right-sided Port-A-Cath tip is positioned in the  mid to distal SVC. Mediastinum/Nodes: No mediastinal lymphadenopathy. No evidence for gross hilar lymphadenopathy although assessment is limited by the lack of intravenous contrast on the current study. The esophagus has normal imaging features. 11 mm short axis left subpectoral lymph node was 9 mm previously. Lungs/Pleura: Diffuse interlobular septal thickening is noted in both lungs with patchy areas of ground-glass and confluent airspace disease bilaterally. Areas of cylindrical bronchiectasis in both lungs are similar to prior with some areas of cystic bronchiectasis in the inferior right lung base. The patient had multiple pulmonary nodules on the previous CT that are essentially obscured by airspace disease on the current exam. Areas of impaction noted in the bronchiectatic airways of the right lower lobe. Musculoskeletal: Both clavicles appear sclerotic, similar to prior. Finding is nonspecific and most likely benign given the symmetry. CT ABDOMEN PELVIS FINDINGS Hepatobiliary: No suspicious focal abnormality in the liver on this study without intravenous contrast. Gallbladder is distended with focal wall thickening and contraction in the fundal region. No calcified gallstones although subtle layering increased density is seen in the dependent lumen suggesting the presence of uncalcified stones. No intrahepatic or extrahepatic biliary dilation. Pancreas: No focal mass lesion. No dilatation of the main duct. No intraparenchymal cyst. No peripancreatic edema. Spleen: No splenomegaly. No suspicious focal mass lesion. Adrenals/Urinary Tract: No adrenal nodule or mass. Similar 2.8 cm water density lesion upper pole left kidney most suggestive of a cyst. Nonobstructing stones are again noted left kidney. Right kidney unremarkable. No evidence for hydroureter. The urinary bladder appears normal for the degree of distention. Stomach/Bowel: Tiny hiatal hernia. Stomach decompressed. Duodenum is normally positioned as  is the ligament of Treitz. No small bowel wall thickening. No small bowel dilatation. The terminal ileum is normal. The appendix is normal. No gross colonic mass. No colonic wall thickening. Vascular/Lymphatic: There is moderate atherosclerotic calcification of the abdominal aorta without aneurysm. There is no gastrohepatic or hepatoduodenal ligament lymphadenopathy. No retroperitoneal or mesenteric lymphadenopathy. No pelvic sidewall lymphadenopathy. Reproductive: The uterus is unremarkable.  There is no adnexal mass. Other: No intraperitoneal free fluid. Musculoskeletal: Diffuse body wall edema evident. No worrisome lytic or sclerotic osseous abnormality. IMPRESSION: 1. Diffuse interlobular septal thickening in both lungs with patchy areas of ground-glass and confluent airspace disease bilaterally. Imaging features are nonspecific and may be related to pulmonary edema or an infectious/inflammatory process. Pulmonary/lymphangitic metastatic involvement is considered less likely. 2. Areas of cylindrical bronchiectasis in both lungs are similar to prior with some areas of cystic bronchiectasis in the inferior right lung base. Areas of impaction  noted in the bronchiectatic airways of the right lower lobe. 3. The patient had multiple pulmonary nodules on the previous CT that are essentially obscured by airspace disease on the current exam. 4. 11 mm short axis left subpectoral lymph node was 9 mm previously. 5. Distended gallbladder with focal wall thickening and contraction in the fundal region. While probably related to adenomyomatosis, follow-up nonemergent outpatient right upper quadrant ultrasound recommended to further evaluate. No calcified gallstones although subtle layering increased density is seen in the dependent lumen suggesting the presence of uncalcified stones. 6. Nonobstructing left renal stones. 7. Diffuse body wall edema. Electronically Signed   By: Kennith Center M.D.   On: 05/10/2023 06:36   DG  Chest 2 View Result Date: 05/10/2023 CLINICAL DATA:  Shortness of breath and cough. History of breast cancer. EXAM: CHEST - 2 VIEW COMPARISON:  Radiographs 05/02/2023 and CT chest 12/20/2022 FINDINGS: Right chest wall Port-A-Cath tip in the low SVC. Stable cardiomediastinal silhouette. Diffuse bilateral airspace and interstitial opacities have progressed compared to 05/02/2023. No pleural effusion or pneumothorax. No displaced rib fracture. IMPRESSION: Diffuse bilateral airspace and interstitial opacities have progressed compared to 05/02/2023. This is favored to represent pneumonia. Edema could appear similarly. The known pulmonary nodules are not well demonstrated radiographically. Electronically Signed   By: Minerva Fester M.D.   On: 05/10/2023 01:26   NM Pulmonary Perfusion Result Date: 05/03/2023 CLINICAL DATA:  Left breast cancer, short of breath, pulmonary embolism suspected high probability EXAM: NUCLEAR MEDICINE PERFUSION LUNG SCAN TECHNIQUE: Perfusion images were obtained in multiple projections after intravenous injection of radiopharmaceutical. Ventilation scans intentionally deferred if perfusion scan and chest x-ray adequate for interpretation. RADIOPHARMACEUTICALS:  4 mCi Tc-55m MAA IV COMPARISON:  Chest x-ray 05/02/2023 FINDINGS: Planar images of the chest are obtained in multiple projections. There are no wedge-shaped perfusion defects. Slight heterogeneity of radiotracer activity within the right lung corresponds to the multifocal airspace disease seen on chest x-ray. IMPRESSION: 1. Low probability study for pulmonary embolus utilizing modified PIOPED II criteria. Electronically Signed   By: Sharlet Salina M.D.   On: 05/03/2023 16:53   ECHOCARDIOGRAM COMPLETE Result Date: 05/03/2023    ECHOCARDIOGRAM REPORT   Patient Name:   BETTYE SITTON Date of Exam: 05/03/2023 Medical Rec #:  [id number]         Height:       64.0 in Accession #:    [id number]        Weight:       158.5 lb Date of Birth:   12/24/1940          BSA:          1.772 m Patient Age:    83 years          BP:           192/72 mmHg Patient Gender: F                 HR:           95 bpm. Exam Location:  Outpatient Procedure: 2D Echo, Cardiac Doppler and Color Doppler (Both Spectral and Color            Flow Doppler were utilized during procedure). Indications:    Chemo  History:        Patient has prior history of Echocardiogram examinations, most                 recent 12/26/2022.  Sonographer:    Harriette Bouillon RDCS Referring Phys:  [id number] YAN FENG IMPRESSIONS  1. Left ventricular ejection fraction, by estimation, is 60 to 65%. The left ventricle has normal function. The left ventricle has no regional wall motion abnormalities. There is moderate left ventricular hypertrophy. Left ventricular diastolic parameters are consistent with Grade I diastolic dysfunction (impaired relaxation).  2. Right ventricular systolic function is normal. The right ventricular size is normal. Tricuspid regurgitation signal is inadequate for assessing PA pressure.  3. Left atrial size was mild to moderately dilated.  4. The mitral valve is degenerative. Mild mitral valve regurgitation. No evidence of mitral stenosis.  5. The aortic valve is grossly normal. Aortic valve regurgitation is not visualized. No aortic stenosis is present.  6. The inferior vena cava is normal in size with greater than 50% respiratory variability, suggesting right atrial pressure of 3 mmHg. FINDINGS  Left Ventricle: Left ventricular ejection fraction, by estimation, is 60 to 65%. The left ventricle has normal function. The left ventricle has no regional wall motion abnormalities. Strain imaging was not performed. The left ventricular internal cavity  size was normal in size. There is moderate left ventricular hypertrophy. Left ventricular diastolic parameters are consistent with Grade I diastolic dysfunction (impaired relaxation). Right Ventricle: The right ventricular size is normal. No  increase in right ventricular wall thickness. Right ventricular systolic function is normal. Tricuspid regurgitation signal is inadequate for assessing PA pressure. Left Atrium: Left atrial size was mild to moderately dilated. Right Atrium: Right atrial size was normal in size. Pericardium: There is no evidence of pericardial effusion. Mitral Valve: The mitral valve is degenerative in appearance. Mild to moderate mitral annular calcification. Mild mitral valve regurgitation. No evidence of mitral valve stenosis. Tricuspid Valve: The tricuspid valve is normal in structure. Tricuspid valve regurgitation is mild . No evidence of tricuspid stenosis. Aortic Valve: The aortic valve is grossly normal. Aortic valve regurgitation is not visualized. No aortic stenosis is present. Pulmonic Valve: The pulmonic valve was normal in structure. Pulmonic valve regurgitation is mild. No evidence of pulmonic stenosis. Aorta: The aortic root is normal in size and structure. Venous: The inferior vena cava is normal in size with greater than 50% respiratory variability, suggesting right atrial pressure of 3 mmHg. IAS/Shunts: The interatrial septum was not well visualized.  LEFT VENTRICLE PLAX 2D LVIDd:         4.60 cm   Diastology LVIDs:         3.00 cm   LV e' medial:    7.07 cm/s LV PW:         1.30 cm   LV E/e' medial:  14.6 LV IVS:        1.30 cm   LV e' lateral:   5.55 cm/s LVOT diam:     1.80 cm   LV E/e' lateral: 18.6 LV SV:         63 LV SV Index:   36 LVOT Area:     2.54 cm  RIGHT VENTRICLE         IVC TAPSE (M-mode): 2.1 cm  IVC diam: 0.90 cm LEFT ATRIUM           Index        RIGHT ATRIUM           Index LA diam:      4.10 cm 2.31 cm/m   RA Area:     12.20 cm LA Vol (A4C): 56.7 ml 31.99 ml/m  RA Volume:   28.50 ml  16.08 ml/m  AORTIC VALVE LVOT  Vmax:   126.00 cm/s LVOT Vmean:  84.200 cm/s LVOT VTI:    0.249 m  AORTA Ao Root diam: 2.90 cm Ao Asc diam:  2.80 cm MITRAL VALVE MV Area (PHT): 4.29 cm     SHUNTS MV Decel Time:  177 msec     Systemic VTI:  0.25 m MV E velocity: 103.00 cm/s  Systemic Diam: 1.80 cm MV A velocity: 111.00 cm/s MV E/A ratio:  0.93 Weston Brass MD Electronically signed by Weston Brass MD Signature Date/Time: 05/03/2023/3:30:44 PM    Final    DG Chest 2 View Result Date: 05/02/2023 CLINICAL DATA:  Shortness of breath. History of malignant neoplasm of upper-outer quadrant of left breast. EXAM: CHEST - 2 VIEW COMPARISON:  Chest radiographs 04/24/2023 and 02/21/2023; CT chest 12/20/2022 FINDINGS: Cardiac silhouette is again mildly enlarged. Mediastinal contours are within limits. Right chest wall porta catheter is again seen with tip overlying the mid to central aspect of the superior vena cava. There is again chronic interstitial thickening as seen on multiple prior radiographs, greatest within the inferior right lung where there appears to be chronic scarring. Bronchiectasis was also seen in this region on 12/20/2022 CT. Note is made multiple enlarging pulmonary nodules were seen on 12/20/2022 CT suspicious for progressive pulmonary metastases. These are again not well visualized on radiographs. Overall interval increase in mild heterogeneous airspace opacification within the superior right lung. Mildly decreased lung volumes compared to prior with bibasilar bronchovascular crowding. No pleural effusion or pneumothorax.  No acute skeletal abnormality. IMPRESSION: 1. Chronic interstitial thickening as seen on multiple prior radiographs, greatest within the inferior right lung where there appears to be chronic scarring. Chronic bronchiectasis was also seen in this region on 12/20/2022 CT. 2. Note is made multiple enlarging pulmonary nodules were seen on 12/20/2022 CT suspicious for progressive pulmonary metastases. These are again not well visualized on radiographs. 3. Overall interval increase in mild heterogeneous airspace opacification within the superior right lung. This may represent pneumonia in the  appropriate clinical setting. Electronically Signed   By: Neita Garnet M.D.   On: 05/02/2023 17:38   DG Chest 2 View Result Date: 04/24/2023 CLINICAL DATA:  Chest pain EXAM: CHEST - 2 VIEW COMPARISON:  02/21/2023 FINDINGS: Right Port-A-Cath tip at low SVC. Reverse apical lordotic positioning on the frontal. Midline trachea. Borderline cardiomegaly. No pleural effusion or pneumothorax. Lower lung predominant interstitial thickening is nonspecific, most likely due to remote smoking per EMR. Favor scarring at the lateral right lung base, with improved aeration. IMPRESSION: No acute process or explanation for chest pain. Electronically Signed   By: Jeronimo Greaves M.D.   On: 04/24/2023 20:50    Microbiology: Results for orders placed or performed during the hospital encounter of 05/09/23  Resp panel by RT-PCR (RSV, Flu A&B, Covid) Anterior Nasal Swab     Status: Abnormal   Collection Time: 05/10/23 12:26 AM   Specimen: Anterior Nasal Swab  Result Value Ref Range Status   SARS Coronavirus 2 by RT PCR POSITIVE (A) NEGATIVE Final    Comment: (NOTE) SARS-CoV-2 target nucleic acids are DETECTED.  The SARS-CoV-2 RNA is generally detectable in upper respiratory specimens during the acute phase of infection. Positive results are indicative of the presence of the identified virus, but do not rule out bacterial infection or co-infection with other pathogens not detected by the test. Clinical correlation with patient history and other diagnostic information is necessary to determine patient infection status. The expected result is Negative.  Fact Sheet  for Patients: BloggerCourse.com  Fact Sheet for Healthcare Providers: SeriousBroker.it  This test is not yet approved or cleared by the [place] FDA and  has been authorized for detection and/or diagnosis of SARS-CoV-2 by FDA under an Emergency Use Authorization (EUA).  This EUA will remain in  effect (meaning this test can be used) for the duration of  the COVID-19 declaration under Section 564(b)(1) of the A ct, 21 U.S.C. section 360bbb-3(b)(1), unless the authorization is terminated or revoked sooner.     Influenza A by PCR NEGATIVE NEGATIVE Final   Influenza B by PCR NEGATIVE NEGATIVE Final    Comment: (NOTE) The Xpert Xpress SARS-CoV-2/FLU/RSV plus assay is intended as an aid in the diagnosis of influenza from Nasopharyngeal swab specimens and should not be used as a sole basis for treatment. Nasal washings and aspirates are unacceptable for Xpert Xpress SARS-CoV-2/FLU/RSV testing.  Fact Sheet for Patients: BloggerCourse.com  Fact Sheet for Healthcare Providers: SeriousBroker.it  This test is not yet approved or cleared by the [place] FDA and has been authorized for detection and/or diagnosis of SARS-CoV-2 by FDA under an Emergency Use Authorization (EUA). This EUA will remain in effect (meaning this test can be used) for the duration of the COVID-19 declaration under Section 564(b)(1) of the Act, 21 U.S.C. section 360bbb-3(b)(1), unless the authorization is terminated or revoked.     Resp Syncytial Virus by PCR NEGATIVE NEGATIVE Final    Comment: (NOTE) Fact Sheet for Patients: BloggerCourse.com  Fact Sheet for Healthcare Providers: SeriousBroker.it  This test is not yet approved or cleared by the [place] FDA and has been authorized for detection and/or diagnosis of SARS-CoV-2 by FDA under an Emergency Use Authorization (EUA). This EUA will remain in effect (meaning this test can be used) for the duration of the COVID-19 declaration under Section 564(b)(1) of the Act, 21 U.S.C. section 360bbb-3(b)(1), unless the authorization is terminated or revoked.  Performed at [facility], [address] [place], [place] [address]    Blood culture (routine x 2)     Status: None   Collection Time: 05/10/23  3:45 AM   Specimen: BLOOD LEFT ARM  Result Value Ref Range Status   Specimen Description   Final    BLOOD LEFT ARM Performed at [facility], [address] [place], [place] [address]    Special Requests   Final    BOTTLES DRAWN AEROBIC AND ANAEROBIC Blood Culture results may not be optimal due to an inadequate volume of blood received in culture bottles Performed at [facility], [address] [place], [place] [address]    Culture   Final    NO GROWTH 5 DAYS Performed at [facility], [address], [place], [place] [address]    Report Status 05/15/2023 FINAL  Final    Labs: CBC: Recent Labs  Lab 05/10/23 0105 05/11/23 0410 05/12/23 0351 05/13/23 1304 05/14/23 0757 05/15/23 0357  WBC 15.1* 14.9* 20.1* 24.0* 25.0* 24.4*  NEUTROABS 11.9*  --   --   --   --   --   HGB 9.0* 8.4* 7.9* 8.9* 8.7* 8.7*  HCT 27.0* 25.5* 25.3* 25.8* 26.6* 26.6*  MCV 86.5 86.7 88.8 85.1 88.7 86.4  PLT 127* 107* 106* 120* 133* 128*   Basic Metabolic Panel: Recent Labs  Lab 05/11/23 0410 05/12/23 0351 05/13/23 1304 05/14/23 0757 05/15/23 0357  NA 145 144 143 141 142  K 3.2* 4.1 4.1 4.0 4.1  CL  112* 110 111 111 112*  CO2 22 23 23 22 22   GLUCOSE 142* 153* 157* 159* 165*  BUN 38* 46* 54* 55* 55*  CREATININE 1.89* 1.96* 2.15* 2.15* 1.99*  CALCIUM 9.4 9.1 9.6 9.2 9.3   Liver Function Tests: Recent Labs  Lab 05/10/23 0105 05/11/23 0410 05/12/23 0351  AST 66* 52* 63*  ALT 37 31 34  ALKPHOS 118 113 124  BILITOT 2.1* 1.3* 1.3*  PROT 4.9* 4.4* 4.4*  ALBUMIN 1.9* 1.6* 1.6*   CBG: Recent Labs  Lab 05/15/23 1117 05/15/23 1743 05/15/23 2217 05/16/23 0736 05/16/23 1133  GLUCAP 166* 194* 214* 164* 182*    Discharge time spent: approximately 45 minutes spent on discharge counseling, evaluation of patient on day of discharge, and coordination of discharge planning with nursing,  social work, pharmacy and case management  Signed: Alberteen Sam, MD [facility] 05/16/2023

## 2023-05-17 ENCOUNTER — Other Ambulatory Visit (HOSPITAL_COMMUNITY): Payer: Self-pay

## 2023-05-17 ENCOUNTER — Other Ambulatory Visit: Payer: Self-pay

## 2023-05-22 ENCOUNTER — Ambulatory Visit: Admitting: Hematology

## 2023-05-22 ENCOUNTER — Telehealth: Payer: Self-pay

## 2023-05-22 ENCOUNTER — Other Ambulatory Visit: Payer: Medicare HMO

## 2023-05-22 ENCOUNTER — Other Ambulatory Visit

## 2023-05-22 ENCOUNTER — Ambulatory Visit: Payer: Medicare HMO | Admitting: Hematology

## 2023-05-22 NOTE — Telephone Encounter (Signed)
 LVM for pt's daughter to give Dr. Latanya Maudlin office a call regarding the pt.  Stated this is a f/u call to see how the pt is doing since being d/c from hospital on hospice.  Requested a return call.

## 2023-05-22 NOTE — Telephone Encounter (Signed)
 Pt's daughter called back and spoke with Sharlette Dense, CMA regarding pt's status (see previous telephone note by this RN).  Pt's daughter stated the pt is lying in bed and unresponsive.  Pt's daughter stated that the hospice nurse is currently there with pt and family.  John instructed pt's daughter to please give Dr. Latanya Maudlin office a call if they need any.  Pt's daughter verbalized understanding.  Notified Dr. Mosetta Putt and her Team of the pt's status.

## 2023-06-06 DEATH — deceased

## 2023-06-15 ENCOUNTER — Other Ambulatory Visit (HOSPITAL_COMMUNITY): Payer: Self-pay
